# Patient Record
Sex: Male | Born: 1950 | Race: White | Hispanic: No | Marital: Married | Smoking: Never smoker
Health system: Southern US, Community
[De-identification: ages and names within clinical notes are randomized; demographics above are authoritative.]

## PROBLEM LIST (undated history)

## (undated) DIAGNOSIS — C4492 Squamous cell carcinoma of skin, unspecified: Secondary | ICD-10-CM

## (undated) DIAGNOSIS — R7302 Impaired glucose tolerance (oral): Secondary | ICD-10-CM

## (undated) DIAGNOSIS — K219 Gastro-esophageal reflux disease without esophagitis: Secondary | ICD-10-CM

## (undated) DIAGNOSIS — K635 Polyp of colon: Secondary | ICD-10-CM

## (undated) DIAGNOSIS — E785 Hyperlipidemia, unspecified: Secondary | ICD-10-CM

## (undated) DIAGNOSIS — Z9289 Personal history of other medical treatment: Secondary | ICD-10-CM

## (undated) DIAGNOSIS — H8110 Benign paroxysmal vertigo, unspecified ear: Secondary | ICD-10-CM

## (undated) DIAGNOSIS — D229 Melanocytic nevi, unspecified: Secondary | ICD-10-CM

## (undated) DIAGNOSIS — H43393 Other vitreous opacities, bilateral: Secondary | ICD-10-CM

## (undated) DIAGNOSIS — M25561 Pain in right knee: Secondary | ICD-10-CM

## (undated) DIAGNOSIS — I10 Essential (primary) hypertension: Secondary | ICD-10-CM

## (undated) HISTORY — DX: Gastro-esophageal reflux disease without esophagitis: K21.9

## (undated) HISTORY — DX: Impaired glucose tolerance (oral): R73.02

## (undated) HISTORY — DX: Other vitreous opacities, bilateral: H43.393

## (undated) HISTORY — DX: Personal history of other medical treatment: Z92.89

## (undated) HISTORY — DX: Hyperlipidemia, unspecified: E78.5

## (undated) HISTORY — DX: Benign paroxysmal vertigo, unspecified ear: H81.10

## (undated) HISTORY — PX: OTHER SURGICAL HISTORY: SHX169

## (undated) HISTORY — DX: Polyp of colon: K63.5

## (undated) HISTORY — DX: Essential (primary) hypertension: I10

## (undated) HISTORY — DX: Pain in right knee: M25.561

## (undated) HISTORY — PX: ARTHROSCOPIC REPAIR ACL: SUR80

---
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
## 2004-09-02 HISTORY — PX: COLONOSCOPY W/ POLYPECTOMY: SHX1380

## 2006-05-28 ENCOUNTER — Encounter: Admission: RE | Admit: 2006-05-28 | Discharge: 2006-05-28 | Payer: Self-pay | Admitting: Orthopedic Surgery

## 2008-04-28 DIAGNOSIS — D229 Melanocytic nevi, unspecified: Secondary | ICD-10-CM

## 2008-04-28 HISTORY — DX: Melanocytic nevi, unspecified: D22.9

## 2008-07-22 DIAGNOSIS — C439 Malignant melanoma of skin, unspecified: Secondary | ICD-10-CM

## 2008-07-22 HISTORY — DX: Malignant melanoma of skin, unspecified: C43.9

## 2008-08-02 HISTORY — PX: OTHER SURGICAL HISTORY: SHX169

## 2010-06-15 ENCOUNTER — Encounter: Admission: RE | Admit: 2010-06-15 | Discharge: 2010-06-15 | Payer: Self-pay | Admitting: Gastroenterology

## 2013-02-24 DIAGNOSIS — C4492 Squamous cell carcinoma of skin, unspecified: Secondary | ICD-10-CM

## 2013-02-24 HISTORY — DX: Squamous cell carcinoma of skin, unspecified: C44.92

## 2016-10-22 DIAGNOSIS — R5383 Other fatigue: Secondary | ICD-10-CM | POA: Insufficient documentation

## 2016-10-22 DIAGNOSIS — R0789 Other chest pain: Secondary | ICD-10-CM | POA: Insufficient documentation

## 2016-10-23 ENCOUNTER — Encounter (INDEPENDENT_AMBULATORY_CARE_PROVIDER_SITE_OTHER): Payer: Self-pay

## 2016-10-23 ENCOUNTER — Ambulatory Visit (INDEPENDENT_AMBULATORY_CARE_PROVIDER_SITE_OTHER): Payer: BLUE CROSS/BLUE SHIELD | Admitting: Physician Assistant

## 2016-10-23 ENCOUNTER — Encounter: Payer: Self-pay | Admitting: Physician Assistant

## 2016-10-23 VITALS — BP 118/70 | HR 64 | Ht 66.0 in | Wt 166.4 lb

## 2016-10-23 DIAGNOSIS — I10 Essential (primary) hypertension: Secondary | ICD-10-CM

## 2016-10-23 DIAGNOSIS — R5383 Other fatigue: Secondary | ICD-10-CM | POA: Diagnosis not present

## 2016-10-23 DIAGNOSIS — E784 Other hyperlipidemia: Secondary | ICD-10-CM

## 2016-10-23 DIAGNOSIS — R0789 Other chest pain: Secondary | ICD-10-CM

## 2016-10-23 DIAGNOSIS — E7849 Other hyperlipidemia: Secondary | ICD-10-CM

## 2016-10-23 NOTE — Patient Instructions (Addendum)
Medication Instructions:  No changes.   Labwork: Today - CBC w/ DIFF   Testing/Procedures: Your physician has requested that you have en exercise stress myoview. For further information please visit [email or web address]. Please follow instruction sheet, as given.  Follow-Up: As needed with Dr. Lauree Chandler or Richardson Dopp, PA-C   Any Other Special Instructions Will Be Listed Below (If Applicable). Have your wife watch your sleep patterns.  If you stop breathing when you sleep, follow up with primary care to schedule a sleep test.   If you need a refill on your cardiac medications before your next appointment, please call your pharmacy.

## 2016-10-23 NOTE — Progress Notes (Signed)
Cardiology Office Note:    Date:  10/23/2016   ID:  David Jarvis, DOB 1951/06/09, MRN [id number]  PCP:  Wenda Low, MD  Cardiologist:  New - reviewed with Dr. Lauree Chandler today.   Electrophysiologist:  n/a  Referring MD: Wenda Low, MD   Chief Complaint  Patient presents with  . Chest Pain    History of Present Illness:    David Jarvis is a 66 y.o. male with a hx of HTN, HL, glucose intolerance, GERD.  He is referred by his PCP to evaluate chest pain.  He is very active and usually exercises 3-4 times a week.  He does body pump and cycling classes.  Over the last several weeks, he notes significant fatigue.  He has been taking 2-3 hour naps some afternoons after work.  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  He does not have significant snoring.  He notes L sided chest pain at times with radiation to his L wrist and his L shoulder. He sometimes feels pain in his temple. Exercise makes his pain better.  He denies shortness of breath, orthopnea, PND, edema, syncope.  He denies pleuritic chest pain or chest pain with lying supine.  He has wondered if his symptoms could be related to acid reflux.  However, he denies significant relation to meals.   PAD Screen 10/23/2016  Previous PAD dx? No  Previous surgical procedure? No  Pain with walking? No  Feet/toe relief with dangling? No  Painful, non-healing ulcers? No  Extremities discolored? No    Prior CV studies:   The following studies were reviewed today:  Myoview 12/09 EF 67, normal perfusion, excellent exercise capacity, low risk  Past Medical History:  Diagnosis Date  . BPV (benign positional vertigo)   . Colon polyps   . GERD (gastroesophageal reflux disease)   . Glucose intolerance (impaired glucose tolerance)   . Hyperlipidemia   . Hypertension   . Right knee pain   . Vitreous floaters of both eyes     Past Surgical History:  Procedure Laterality Date  . ARTHROSCOPIC REPAIR ACL  Right   . cardiac stress test  08/2008  . COLONOSCOPY W/ POLYPECTOMY  2006  . laser surgery of the eye      Current Medications: Current Meds  Medication Sig  . aspirin EC 81 MG tablet Take 81 mg by mouth daily.  Marland Kitchen atorvastatin (LIPITOR) 10 MG tablet Take 10 mg by mouth daily.  Marland Kitchen losartan-hydrochlorothiazide (HYZAAR) 50-12.5 MG tablet Take 1 tablet by mouth daily.  . pantoprazole (PROTONIX) 40 MG tablet Take 40 mg by mouth daily.     Allergies:   Lisinopril   Social History   Social History  . Marital status: Unknown    Spouse name: N/A  . Number of children: N/A  . Years of education: N/A   Social History Main Topics  . Smoking status: Never Smoker  . Smokeless tobacco: Never Used  . Alcohol use Yes     Comment: occasionally  . Drug use: No  . Sexual activity: Yes     Comment: married   Other Topics Concern  . None   Social History Narrative   Accountant   Married.  2 daughters.   Lived in [place] for many years.  Born in [place].   [employer]; [place].     Family History  Problem Relation Age of Onset  . CAD Mother   . Diabetes Mother   .  Heart attack Mother 24  . CVA Father   . CVA Sister   . CAD Brother   . Diabetes Brother   . Heart attack Brother 12  . Heart failure Neg Hx      ROS:   Please see the history of present illness.    ROS All other systems reviewed and are negative.   EKGs/Labs/Other Test Reviewed:    EKG:  EKG is  ordered today - it demonstrates NSR, HR 64, normal axis, QTc 422 ms, no changes. ECG from primary care 10/11/16: Sinus bradycardia, HR 49, normal axis, QTC 429  Recent Labs: No results found for requested labs within last 8760 hours.  Labs from PCP 10/11/16: TSH 1.7  Recent Lipid Panel No results found for: CHOL, TRIG, HDL, CHOLHDL, VLDL, LDLCALC, LDLDIRECT Labs from PCP 10/11/16: TC 143, TGs 79, HDL 41, LDL 85  Physical Exam:    VS:  BP 118/70 (BP Location: Left Arm, Patient Position:  Sitting, Cuff Size: Normal)   Pulse 64   Ht 5\' 6"  (1.676 m)   Wt 166 lb 6.4 oz (75.5 kg)   SpO2 98%   BMI 26.86 kg/m     Wt Readings from Last 3 Encounters:  10/23/16 166 lb 6.4 oz (75.5 kg)     Physical Exam  Constitutional: He is oriented to person, place, and time. He appears well-developed and well-nourished. No distress.  HENT:  Head: Normocephalic and atraumatic.  Eyes: No scleral icterus.  Neck: Normal range of motion. No JVD present. Carotid bruit is not present.  Cardiovascular: Normal rate, regular rhythm, S1 normal and S2 normal.   No murmur heard. Pulses:      Dorsalis pedis pulses are 2+ on the right side, and 2+ on the left side.       Posterior tibial pulses are 2+ on the right side, and 2+ on the left side.  Pulmonary/Chest: Effort normal and breath sounds normal. He has no wheezes. He has no rhonchi. He has no rales.  Abdominal: Soft. There is no tenderness.  Musculoskeletal: He exhibits no edema.  Neurological: He is alert and oriented to person, place, and time.  Skin: Skin is warm and dry.  Psychiatric: He has a normal mood and affect.    ASSESSMENT:    1. Other chest pain   2. Other fatigue   3. Essential hypertension   4. Other hyperlipidemia    PLAN:    In order of problems listed above:  1. Chest pain - He has fairly atypical chest pain with s/w typical features.  His symptoms resolve with exercise.  He does note fatigue which is unusual. He exercises quite often without problems.  His ECG is without acute changes.  He does have several risk factors including HTN, HL, borderline DM, FHx CAD.  Continue ASA.  -  Arrange ETT-Myoview  2. Fatigue - Etiology not clear.  He will have his wife monitor for apnea.  Check CBC.  Obtain Myoview as noted.   3. HTN - BP controlled   4. HL - Managed by PCP.  Continue statin.    Medication Adjustments/Labs and Tests Ordered: Current medicines are reviewed at length with the patient today.  Concerns  regarding medicines are outlined above.  Medication changes, Labs and Tests ordered today are outlined in the Patient Instructions noted below. Patient Instructions  Medication Instructions:  No changes.   Labwork: Today - CBC w/ DIFF   Testing/Procedures: Your physician has requested that you have en  exercise stress myoview. For further information please visit [email or web address]. Please follow instruction sheet, as given.  Follow-Up: As needed with Dr. Lauree Chandler or Richardson Dopp, PA-C   Any Other Special Instructions Will Be Listed Below (If Applicable). Have your wife watch your sleep patterns.  If you stop breathing when you sleep, follow up with primary care to schedule a sleep test.   If you need a refill on your cardiac medications before your next appointment, please call your pharmacy.    Return if symptoms worsen or fail to improve.   Signed, Richardson Dopp, PA-C  10/23/2016 12:33 PM    [facility] [place], [place], [place]  [address] Phone: [phone]; Fax: [phone]

## 2016-10-24 ENCOUNTER — Telehealth (HOSPITAL_COMMUNITY): Payer: Self-pay | Admitting: *Deleted

## 2016-10-24 LAB — CBC WITH DIFFERENTIAL/PLATELET

## 2016-10-24 NOTE — Telephone Encounter (Signed)
Patient given detailed instructions per Myocardial Perfusion Study Information Sheet for the test on 10/29/16. Patient notified to arrive 15 minutes early and that it is imperative to arrive on time for appointment to keep from having the test rescheduled.  If you need to cancel or reschedule your appointment, please call the office within 24 hours of your appointment. Failure to do so may result in a cancellation of your appointment, and a $50 no show fee. Patient verbalized understanding. David Jarvis

## 2016-10-29 ENCOUNTER — Telehealth: Payer: Self-pay | Admitting: *Deleted

## 2016-10-29 ENCOUNTER — Ambulatory Visit (HOSPITAL_COMMUNITY): Payer: BLUE CROSS/BLUE SHIELD | Attending: Cardiovascular Disease

## 2016-10-29 ENCOUNTER — Other Ambulatory Visit: Payer: BLUE CROSS/BLUE SHIELD | Admitting: *Deleted

## 2016-10-29 DIAGNOSIS — I10 Essential (primary) hypertension: Secondary | ICD-10-CM | POA: Diagnosis not present

## 2016-10-29 DIAGNOSIS — I2510 Atherosclerotic heart disease of native coronary artery without angina pectoris: Secondary | ICD-10-CM | POA: Diagnosis present

## 2016-10-29 DIAGNOSIS — Z8249 Family history of ischemic heart disease and other diseases of the circulatory system: Secondary | ICD-10-CM | POA: Insufficient documentation

## 2016-10-29 DIAGNOSIS — R0789 Other chest pain: Secondary | ICD-10-CM | POA: Diagnosis not present

## 2016-10-29 DIAGNOSIS — R5383 Other fatigue: Principal | ICD-10-CM

## 2016-10-29 DIAGNOSIS — F32A Depression, unspecified: Secondary | ICD-10-CM

## 2016-10-29 DIAGNOSIS — F329 Major depressive disorder, single episode, unspecified: Secondary | ICD-10-CM

## 2016-10-29 LAB — CBC WITH DIFFERENTIAL/PLATELET
BASOS ABS: 0 10*3/uL (ref 0.0–0.2)
BASOS: 0 %
EOS (ABSOLUTE): 0.1 10*3/uL (ref 0.0–0.4)
EOS: 2 %
HEMOGLOBIN: 14.4 g/dL (ref 13.0–17.7)
Hematocrit: 42.1 % (ref 37.5–51.0)
IMMATURE GRANS (ABS): 0 10*3/uL (ref 0.0–0.1)
IMMATURE GRANULOCYTES: 0 %
Lymphocytes Absolute: 1.8 10*3/uL (ref 0.7–3.1)
Lymphs: 43 %
MCH: 29.8 pg (ref 26.6–33.0)
MCHC: 34.2 g/dL (ref 31.5–35.7)
MCV: 87 fL (ref 79–97)
Monocytes Absolute: 0.5 10*3/uL (ref 0.1–0.9)
Monocytes: 12 %
NEUTROS ABS: 1.8 10*3/uL (ref 1.4–7.0)
NEUTROS PCT: 43 %
Platelets: 168 10*3/uL (ref 150–379)
RBC: 4.84 x10E6/uL (ref 4.14–5.80)
RDW: 12.8 % (ref 12.3–15.4)
WBC: 4.2 10*3/uL (ref 3.4–10.8)

## 2016-10-29 MED ORDER — TECHNETIUM TC 99M TETROFOSMIN IV KIT
32.3000 | PACK | Freq: Once | INTRAVENOUS | Status: AC | PRN
  Administered 2016-10-29: 32.3 via INTRAVENOUS
  Filled 2016-10-29: qty 33

## 2016-10-29 MED ORDER — TECHNETIUM TC 99M TETROFOSMIN IV KIT
10.6000 | PACK | Freq: Once | INTRAVENOUS | Status: AC | PRN
  Administered 2016-10-29: 10.6 via INTRAVENOUS
  Filled 2016-10-29: qty 11

## 2016-10-29 NOTE — Telephone Encounter (Signed)
Lmtcb to go over lab results 

## 2016-10-30 ENCOUNTER — Telehealth: Payer: Self-pay | Admitting: *Deleted

## 2016-10-30 ENCOUNTER — Encounter: Payer: Self-pay | Admitting: Physician Assistant

## 2016-10-30 LAB — MYOCARDIAL PERFUSION IMAGING
CHL CUP NUCLEAR SDS: 0
CHL CUP NUCLEAR SRS: 0
CHL CUP NUCLEAR SSS: 0
CHL RATE OF PERCEIVED EXERTION: 18
CSEPEW: 11.7 METS
CSEPHR: 93 %
CSEPPHR: 146 {beats}/min
Exercise duration (min): 10 min
LV dias vol: 99 mL (ref 62–150)
LVSYSVOL: 43 mL
MPHR: 156 {beats}/min
RATE: 0.34
Rest HR: 50 {beats}/min
TID: 0.91

## 2016-10-30 NOTE — Telephone Encounter (Signed)
Pt notified of lab results. Pt asked if stress test results in yet. I advised not yet, though I will call once test has been read. Pt thanked me for my call today.

## 2016-10-30 NOTE — Telephone Encounter (Signed)
Lmtcb to go over Myoview results.  

## 2016-10-30 NOTE — Telephone Encounter (Signed)
Patient is returning your call,thanks. °

## 2016-10-31 ENCOUNTER — Telehealth: Payer: Self-pay | Admitting: Physician Assistant

## 2016-10-31 NOTE — Telephone Encounter (Signed)
-----   Message from Liliane Shi, [place] sent at 10/30/2016  5:47 PM EST ----- Please call the patient. The stress test demonstrates no ischemia (loss of blood flow from a blockage). Heart function is normal. Continue current treatment plan. Please fax a copy of this study result to his PCP:  Wenda Low, MD  Thanks! Richardson Dopp, PA-C    10/30/2016 5:46 PM

## 2016-10-31 NOTE — Telephone Encounter (Signed)
New Message    Pt calling for his stress test results

## 2016-10-31 NOTE — Telephone Encounter (Signed)
Patient made aware of results. Patient verbalizes understanding.  

## 2017-04-15 ENCOUNTER — Ambulatory Visit
Admission: RE | Admit: 2017-04-15 | Discharge: 2017-04-15 | Disposition: A | Discharge status: Home / Self Care | Payer: BLUE CROSS/BLUE SHIELD | Source: Ambulatory Visit | Attending: Internal Medicine | Admitting: Internal Medicine

## 2017-04-15 ENCOUNTER — Other Ambulatory Visit: Payer: Self-pay | Admitting: Internal Medicine

## 2017-04-15 DIAGNOSIS — R079 Chest pain, unspecified: Secondary | ICD-10-CM

## 2017-10-14 DIAGNOSIS — I10 Essential (primary) hypertension: Secondary | ICD-10-CM | POA: Diagnosis not present

## 2017-10-14 DIAGNOSIS — Z1389 Encounter for screening for other disorder: Secondary | ICD-10-CM | POA: Diagnosis not present

## 2017-10-14 DIAGNOSIS — K219 Gastro-esophageal reflux disease without esophagitis: Secondary | ICD-10-CM | POA: Diagnosis not present

## 2017-10-14 DIAGNOSIS — E782 Mixed hyperlipidemia: Secondary | ICD-10-CM | POA: Diagnosis not present

## 2018-02-12 DIAGNOSIS — W57XXXA Bitten or stung by nonvenomous insect and other nonvenomous arthropods, initial encounter: Secondary | ICD-10-CM | POA: Diagnosis not present

## 2018-02-12 DIAGNOSIS — M778 Other enthesopathies, not elsewhere classified: Secondary | ICD-10-CM | POA: Diagnosis not present

## 2018-02-12 DIAGNOSIS — S80261A Insect bite (nonvenomous), right knee, initial encounter: Secondary | ICD-10-CM | POA: Diagnosis not present

## 2018-04-29 DIAGNOSIS — Z0000 Encounter for general adult medical examination without abnormal findings: Secondary | ICD-10-CM | POA: Diagnosis not present

## 2018-04-29 DIAGNOSIS — E782 Mixed hyperlipidemia: Secondary | ICD-10-CM | POA: Diagnosis not present

## 2018-04-29 DIAGNOSIS — Z23 Encounter for immunization: Secondary | ICD-10-CM | POA: Diagnosis not present

## 2018-04-29 DIAGNOSIS — R7303 Prediabetes: Secondary | ICD-10-CM | POA: Diagnosis not present

## 2018-04-29 DIAGNOSIS — K219 Gastro-esophageal reflux disease without esophagitis: Secondary | ICD-10-CM | POA: Diagnosis not present

## 2018-04-29 DIAGNOSIS — Z1389 Encounter for screening for other disorder: Secondary | ICD-10-CM | POA: Diagnosis not present

## 2018-04-29 DIAGNOSIS — I10 Essential (primary) hypertension: Secondary | ICD-10-CM | POA: Diagnosis not present

## 2018-04-29 DIAGNOSIS — N400 Enlarged prostate without lower urinary tract symptoms: Secondary | ICD-10-CM | POA: Diagnosis not present

## 2018-04-29 DIAGNOSIS — Z125 Encounter for screening for malignant neoplasm of prostate: Secondary | ICD-10-CM | POA: Diagnosis not present

## 2018-05-26 DIAGNOSIS — R972 Elevated prostate specific antigen [PSA]: Secondary | ICD-10-CM | POA: Diagnosis not present

## 2018-05-30 DIAGNOSIS — L255 Unspecified contact dermatitis due to plants, except food: Secondary | ICD-10-CM | POA: Diagnosis not present

## 2018-07-16 DIAGNOSIS — C61 Malignant neoplasm of prostate: Secondary | ICD-10-CM | POA: Diagnosis not present

## 2018-07-16 DIAGNOSIS — R972 Elevated prostate specific antigen [PSA]: Secondary | ICD-10-CM | POA: Diagnosis not present

## 2018-07-23 DIAGNOSIS — C61 Malignant neoplasm of prostate: Secondary | ICD-10-CM | POA: Diagnosis not present

## 2018-10-30 DIAGNOSIS — R7303 Prediabetes: Secondary | ICD-10-CM | POA: Diagnosis not present

## 2018-10-30 DIAGNOSIS — N400 Enlarged prostate without lower urinary tract symptoms: Secondary | ICD-10-CM | POA: Diagnosis not present

## 2018-10-30 DIAGNOSIS — C61 Malignant neoplasm of prostate: Secondary | ICD-10-CM | POA: Diagnosis not present

## 2018-10-30 DIAGNOSIS — I10 Essential (primary) hypertension: Secondary | ICD-10-CM | POA: Diagnosis not present

## 2018-10-30 DIAGNOSIS — E782 Mixed hyperlipidemia: Secondary | ICD-10-CM | POA: Diagnosis not present

## 2018-11-13 ENCOUNTER — Other Ambulatory Visit: Payer: Self-pay | Admitting: Urology

## 2018-11-13 DIAGNOSIS — C61 Malignant neoplasm of prostate: Secondary | ICD-10-CM

## 2019-01-12 ENCOUNTER — Other Ambulatory Visit: Payer: Self-pay

## 2019-01-12 ENCOUNTER — Ambulatory Visit
Admission: RE | Admit: 2019-01-12 | Discharge: 2019-01-12 | Disposition: A | Discharge status: Home / Self Care | Payer: PPO | Source: Ambulatory Visit | Attending: Urology | Admitting: Urology

## 2019-01-12 DIAGNOSIS — C61 Malignant neoplasm of prostate: Secondary | ICD-10-CM

## 2019-01-12 MED ORDER — GADOBENATE DIMEGLUMINE 529 MG/ML IV SOLN
15.0000 mL | Freq: Once | INTRAVENOUS | Status: AC | PRN
  Administered 2019-01-12: 15 mL via INTRAVENOUS

## 2019-01-14 DIAGNOSIS — C61 Malignant neoplasm of prostate: Secondary | ICD-10-CM | POA: Diagnosis not present

## 2019-01-21 DIAGNOSIS — R351 Nocturia: Secondary | ICD-10-CM | POA: Diagnosis not present

## 2019-01-21 DIAGNOSIS — C61 Malignant neoplasm of prostate: Secondary | ICD-10-CM | POA: Diagnosis not present

## 2019-01-21 DIAGNOSIS — N401 Enlarged prostate with lower urinary tract symptoms: Secondary | ICD-10-CM | POA: Diagnosis not present

## 2019-02-04 DIAGNOSIS — R110 Nausea: Secondary | ICD-10-CM | POA: Diagnosis not present

## 2019-02-04 DIAGNOSIS — I959 Hypotension, unspecified: Secondary | ICD-10-CM | POA: Diagnosis not present

## 2019-02-04 DIAGNOSIS — R0609 Other forms of dyspnea: Secondary | ICD-10-CM | POA: Diagnosis not present

## 2019-02-05 DIAGNOSIS — R0609 Other forms of dyspnea: Secondary | ICD-10-CM | POA: Diagnosis not present

## 2019-02-08 DIAGNOSIS — I10 Essential (primary) hypertension: Secondary | ICD-10-CM | POA: Diagnosis not present

## 2019-02-26 DIAGNOSIS — E782 Mixed hyperlipidemia: Secondary | ICD-10-CM | POA: Diagnosis not present

## 2019-02-26 DIAGNOSIS — C61 Malignant neoplasm of prostate: Secondary | ICD-10-CM | POA: Diagnosis not present

## 2019-02-26 DIAGNOSIS — I10 Essential (primary) hypertension: Secondary | ICD-10-CM | POA: Diagnosis not present

## 2019-02-26 DIAGNOSIS — N400 Enlarged prostate without lower urinary tract symptoms: Secondary | ICD-10-CM | POA: Diagnosis not present

## 2019-03-16 ENCOUNTER — Encounter: Payer: Self-pay | Admitting: *Deleted

## 2019-05-03 DIAGNOSIS — C61 Malignant neoplasm of prostate: Secondary | ICD-10-CM | POA: Diagnosis not present

## 2019-05-03 DIAGNOSIS — N400 Enlarged prostate without lower urinary tract symptoms: Secondary | ICD-10-CM | POA: Diagnosis not present

## 2019-05-03 DIAGNOSIS — I10 Essential (primary) hypertension: Secondary | ICD-10-CM | POA: Diagnosis not present

## 2019-05-03 DIAGNOSIS — E782 Mixed hyperlipidemia: Secondary | ICD-10-CM | POA: Diagnosis not present

## 2019-05-27 DIAGNOSIS — Z23 Encounter for immunization: Secondary | ICD-10-CM | POA: Diagnosis not present

## 2019-06-02 DIAGNOSIS — E782 Mixed hyperlipidemia: Secondary | ICD-10-CM | POA: Diagnosis not present

## 2019-06-02 DIAGNOSIS — N400 Enlarged prostate without lower urinary tract symptoms: Secondary | ICD-10-CM | POA: Diagnosis not present

## 2019-06-02 DIAGNOSIS — C61 Malignant neoplasm of prostate: Secondary | ICD-10-CM | POA: Diagnosis not present

## 2019-06-02 DIAGNOSIS — I10 Essential (primary) hypertension: Secondary | ICD-10-CM | POA: Diagnosis not present

## 2019-06-28 DIAGNOSIS — C61 Malignant neoplasm of prostate: Secondary | ICD-10-CM | POA: Diagnosis not present

## 2019-06-28 DIAGNOSIS — I10 Essential (primary) hypertension: Secondary | ICD-10-CM | POA: Diagnosis not present

## 2019-06-28 DIAGNOSIS — N400 Enlarged prostate without lower urinary tract symptoms: Secondary | ICD-10-CM | POA: Diagnosis not present

## 2019-06-28 DIAGNOSIS — E782 Mixed hyperlipidemia: Secondary | ICD-10-CM | POA: Diagnosis not present

## 2019-07-16 DIAGNOSIS — C61 Malignant neoplasm of prostate: Secondary | ICD-10-CM | POA: Diagnosis not present

## 2019-07-22 DIAGNOSIS — R3912 Poor urinary stream: Secondary | ICD-10-CM | POA: Diagnosis not present

## 2019-07-22 DIAGNOSIS — C61 Malignant neoplasm of prostate: Secondary | ICD-10-CM | POA: Diagnosis not present

## 2019-07-22 DIAGNOSIS — R351 Nocturia: Secondary | ICD-10-CM | POA: Diagnosis not present

## 2019-08-03 DIAGNOSIS — N400 Enlarged prostate without lower urinary tract symptoms: Secondary | ICD-10-CM | POA: Diagnosis not present

## 2019-08-03 DIAGNOSIS — Z0000 Encounter for general adult medical examination without abnormal findings: Secondary | ICD-10-CM | POA: Diagnosis not present

## 2019-08-03 DIAGNOSIS — R7309 Other abnormal glucose: Secondary | ICD-10-CM | POA: Diagnosis not present

## 2019-08-03 DIAGNOSIS — M6530 Trigger finger, unspecified finger: Secondary | ICD-10-CM | POA: Diagnosis not present

## 2019-08-03 DIAGNOSIS — E782 Mixed hyperlipidemia: Secondary | ICD-10-CM | POA: Diagnosis not present

## 2019-08-03 DIAGNOSIS — C61 Malignant neoplasm of prostate: Secondary | ICD-10-CM | POA: Diagnosis not present

## 2019-08-03 DIAGNOSIS — K219 Gastro-esophageal reflux disease without esophagitis: Secondary | ICD-10-CM | POA: Diagnosis not present

## 2019-08-03 DIAGNOSIS — I10 Essential (primary) hypertension: Secondary | ICD-10-CM | POA: Diagnosis not present

## 2019-08-03 DIAGNOSIS — Z1389 Encounter for screening for other disorder: Secondary | ICD-10-CM | POA: Diagnosis not present

## 2019-08-24 DIAGNOSIS — N400 Enlarged prostate without lower urinary tract symptoms: Secondary | ICD-10-CM | POA: Diagnosis not present

## 2019-08-24 DIAGNOSIS — E782 Mixed hyperlipidemia: Secondary | ICD-10-CM | POA: Diagnosis not present

## 2019-08-24 DIAGNOSIS — I10 Essential (primary) hypertension: Secondary | ICD-10-CM | POA: Diagnosis not present

## 2019-08-24 DIAGNOSIS — C61 Malignant neoplasm of prostate: Secondary | ICD-10-CM | POA: Diagnosis not present

## 2019-09-24 ENCOUNTER — Ambulatory Visit: Payer: Medicare HMO | Attending: Internal Medicine

## 2019-09-24 DIAGNOSIS — Z23 Encounter for immunization: Secondary | ICD-10-CM | POA: Insufficient documentation

## 2019-09-24 NOTE — Progress Notes (Signed)
   Covid-19 Vaccination Clinic  Name:  David Jarvis    MRN: [id number] DOB: 19-Sep-1950  09/24/2019  [name] was observed post Covid-19 immunization for 15 minutes without incidence. He was provided with Vaccine Information Sheet and instruction to access the V-Safe system.   [name] was instructed to call 911 with any severe reactions post vaccine: Marland Kitchen Difficulty breathing  . Swelling of your face and throat  . A fast heartbeat  . A bad rash all over your body  . Dizziness and weakness    Immunizations Administered    Name Date Dose VIS Date Route   Pfizer COVID-19 Vaccine 09/24/2019  6:28 PM 0.3 mL 08/13/2019 Intramuscular   Manufacturer: [place]   Lot: [id number]   [place]: [id number]

## 2019-09-29 DIAGNOSIS — R69 Illness, unspecified: Secondary | ICD-10-CM | POA: Diagnosis not present

## 2019-10-14 DIAGNOSIS — M65331 Trigger finger, right middle finger: Secondary | ICD-10-CM | POA: Diagnosis not present

## 2019-10-14 DIAGNOSIS — M720 Palmar fascial fibromatosis [Dupuytren]: Secondary | ICD-10-CM | POA: Diagnosis not present

## 2019-10-16 ENCOUNTER — Ambulatory Visit: Payer: Medicare HMO | Attending: Internal Medicine

## 2019-10-16 DIAGNOSIS — Z23 Encounter for immunization: Secondary | ICD-10-CM | POA: Insufficient documentation

## 2019-10-16 NOTE — Progress Notes (Signed)
   Covid-19 Vaccination Clinic  Name:  David Jarvis    MRN: [id number] DOB: 06/03/1951  10/16/2019  David Jarvis was observed post Covid-19 immunization for 15 minutes without incidence. He was provided with Vaccine Information Sheet and instruction to access the V-Safe system.   David Jarvis was instructed to call 911 with any severe reactions post vaccine: Marland Kitchen Difficulty breathing  . Swelling of your face and throat  . A fast heartbeat  . A bad rash all over your body  . Dizziness and weakness    Immunizations Administered    Name Date Dose VIS Date Route   Pfizer COVID-19 Vaccine 10/16/2019 11:39 AM 0.3 mL 08/13/2019 Intramuscular   Manufacturer: [place]   Lot: [id number]   [place]: [id number]

## 2019-10-25 DIAGNOSIS — R69 Illness, unspecified: Secondary | ICD-10-CM | POA: Diagnosis not present

## 2019-11-25 DIAGNOSIS — E782 Mixed hyperlipidemia: Secondary | ICD-10-CM | POA: Diagnosis not present

## 2019-11-25 DIAGNOSIS — N400 Enlarged prostate without lower urinary tract symptoms: Secondary | ICD-10-CM | POA: Diagnosis not present

## 2019-11-25 DIAGNOSIS — I10 Essential (primary) hypertension: Secondary | ICD-10-CM | POA: Diagnosis not present

## 2019-11-25 DIAGNOSIS — C61 Malignant neoplasm of prostate: Secondary | ICD-10-CM | POA: Diagnosis not present

## 2020-01-11 DIAGNOSIS — C61 Malignant neoplasm of prostate: Secondary | ICD-10-CM | POA: Diagnosis not present

## 2020-01-18 DIAGNOSIS — R69 Illness, unspecified: Secondary | ICD-10-CM | POA: Diagnosis not present

## 2020-01-20 DIAGNOSIS — R351 Nocturia: Secondary | ICD-10-CM | POA: Diagnosis not present

## 2020-01-20 DIAGNOSIS — R3912 Poor urinary stream: Secondary | ICD-10-CM | POA: Diagnosis not present

## 2020-01-20 DIAGNOSIS — C61 Malignant neoplasm of prostate: Secondary | ICD-10-CM | POA: Diagnosis not present

## 2020-01-20 DIAGNOSIS — N401 Enlarged prostate with lower urinary tract symptoms: Secondary | ICD-10-CM | POA: Diagnosis not present

## 2020-01-27 DIAGNOSIS — R69 Illness, unspecified: Secondary | ICD-10-CM | POA: Diagnosis not present

## 2020-02-03 ENCOUNTER — Encounter (HOSPITAL_COMMUNITY): Payer: Self-pay | Admitting: Anesthesiology

## 2020-02-03 ENCOUNTER — Other Ambulatory Visit: Payer: Self-pay

## 2020-02-03 ENCOUNTER — Emergency Department (HOSPITAL_COMMUNITY): Payer: Medicare HMO | Admitting: Anesthesiology

## 2020-02-03 ENCOUNTER — Ambulatory Visit (HOSPITAL_COMMUNITY)
Admission: EM | Admit: 2020-02-03 | Discharge: 2020-02-03 | Disposition: A | Discharge status: Home / Self Care | Payer: Medicare HMO | Attending: General Surgery | Admitting: General Surgery

## 2020-02-03 ENCOUNTER — Emergency Department (HOSPITAL_COMMUNITY): Payer: Medicare HMO

## 2020-02-03 ENCOUNTER — Encounter (HOSPITAL_COMMUNITY)
Admission: EM | Disposition: A | Discharge status: Home / Self Care | Payer: Self-pay | Source: Home / Self Care | Attending: Emergency Medicine

## 2020-02-03 DIAGNOSIS — Z20822 Contact with and (suspected) exposure to covid-19: Secondary | ICD-10-CM | POA: Insufficient documentation

## 2020-02-03 DIAGNOSIS — Z79899 Other long term (current) drug therapy: Secondary | ICD-10-CM | POA: Diagnosis not present

## 2020-02-03 DIAGNOSIS — S61215A Laceration without foreign body of left ring finger without damage to nail, initial encounter: Secondary | ICD-10-CM | POA: Diagnosis not present

## 2020-02-03 DIAGNOSIS — S68627A Partial traumatic transphalangeal amputation of left little finger, initial encounter: Secondary | ICD-10-CM | POA: Insufficient documentation

## 2020-02-03 DIAGNOSIS — Z85828 Personal history of other malignant neoplasm of skin: Secondary | ICD-10-CM | POA: Diagnosis not present

## 2020-02-03 DIAGNOSIS — E785 Hyperlipidemia, unspecified: Secondary | ICD-10-CM | POA: Diagnosis not present

## 2020-02-03 DIAGNOSIS — S66327A Laceration of extensor muscle, fascia and tendon of left little finger at wrist and hand level, initial encounter: Secondary | ICD-10-CM | POA: Diagnosis not present

## 2020-02-03 DIAGNOSIS — S62636B Displaced fracture of distal phalanx of right little finger, initial encounter for open fracture: Secondary | ICD-10-CM | POA: Diagnosis not present

## 2020-02-03 DIAGNOSIS — S62637B Displaced fracture of distal phalanx of left little finger, initial encounter for open fracture: Secondary | ICD-10-CM

## 2020-02-03 DIAGNOSIS — Z7982 Long term (current) use of aspirin: Secondary | ICD-10-CM | POA: Insufficient documentation

## 2020-02-03 DIAGNOSIS — S62637A Displaced fracture of distal phalanx of left little finger, initial encounter for closed fracture: Secondary | ICD-10-CM | POA: Diagnosis not present

## 2020-02-03 DIAGNOSIS — I10 Essential (primary) hypertension: Secondary | ICD-10-CM | POA: Diagnosis not present

## 2020-02-03 DIAGNOSIS — S63259A Unspecified dislocation of unspecified finger, initial encounter: Secondary | ICD-10-CM

## 2020-02-03 DIAGNOSIS — Z888 Allergy status to other drugs, medicaments and biological substances status: Secondary | ICD-10-CM | POA: Diagnosis not present

## 2020-02-03 DIAGNOSIS — W298XXA Contact with other powered powered hand tools and household machinery, initial encounter: Secondary | ICD-10-CM | POA: Insufficient documentation

## 2020-02-03 DIAGNOSIS — K219 Gastro-esophageal reflux disease without esophagitis: Secondary | ICD-10-CM | POA: Diagnosis not present

## 2020-02-03 DIAGNOSIS — S61217A Laceration without foreign body of left little finger without damage to nail, initial encounter: Secondary | ICD-10-CM | POA: Diagnosis not present

## 2020-02-03 DIAGNOSIS — S63297A Dislocation of distal interphalangeal joint of left little finger, initial encounter: Secondary | ICD-10-CM | POA: Diagnosis not present

## 2020-02-03 DIAGNOSIS — Z03818 Encounter for observation for suspected exposure to other biological agents ruled out: Secondary | ICD-10-CM | POA: Diagnosis not present

## 2020-02-03 HISTORY — PX: I & D EXTREMITY: SHX5045

## 2020-02-03 LAB — SARS CORONAVIRUS 2 BY RT PCR (HOSPITAL ORDER, PERFORMED IN ~~LOC~~ HOSPITAL LAB): SARS Coronavirus 2: NEGATIVE

## 2020-02-03 SURGERY — IRRIGATION AND DEBRIDEMENT EXTREMITY
Anesthesia: General | Laterality: Left

## 2020-02-03 MED ORDER — FENTANYL CITRATE (PF) 100 MCG/2ML IJ SOLN
INTRAMUSCULAR | Status: DC | PRN
  Administered 2020-02-03: 50 ug via INTRAVENOUS
  Administered 2020-02-03: 100 ug via INTRAVENOUS

## 2020-02-03 MED ORDER — CHLORHEXIDINE GLUCONATE 4 % EX LIQD
60.0000 mL | Freq: Once | CUTANEOUS | Status: AC
  Administered 2020-02-03: 4 via TOPICAL

## 2020-02-03 MED ORDER — MIDAZOLAM HCL 5 MG/5ML IJ SOLN
INTRAMUSCULAR | Status: DC | PRN
  Administered 2020-02-03: 2 mg via INTRAVENOUS

## 2020-02-03 MED ORDER — MIDAZOLAM HCL 2 MG/2ML IJ SOLN
INTRAMUSCULAR | Status: AC
  Filled 2020-02-03: qty 2

## 2020-02-03 MED ORDER — BUPIVACAINE HCL (PF) 0.5 % IJ SOLN
INTRAMUSCULAR | Status: AC
  Filled 2020-02-03: qty 30

## 2020-02-03 MED ORDER — HYDROMORPHONE HCL 1 MG/ML IJ SOLN: 0.2500 mg | INTRAMUSCULAR | Status: DC | PRN

## 2020-02-03 MED ORDER — POVIDONE-IODINE 10 % EX SWAB
2.0000 "application " | Freq: Once | CUTANEOUS | Status: AC
  Administered 2020-02-03: 2 via TOPICAL

## 2020-02-03 MED ORDER — PHENYLEPHRINE HCL-NACL 10-0.9 MG/250ML-% IV SOLN
INTRAVENOUS | Status: DC | PRN
  Administered 2020-02-03: 25 ug/min via INTRAVENOUS

## 2020-02-03 MED ORDER — CEPHALEXIN 500 MG PO CAPS: 500.0000 mg | ORAL_CAPSULE | Freq: Four times a day (QID) | ORAL | 0 refills | Status: AC

## 2020-02-03 MED ORDER — ONDANSETRON HCL 4 MG/2ML IJ SOLN
INTRAMUSCULAR | Status: DC | PRN
  Administered 2020-02-03: 4 mg via INTRAVENOUS

## 2020-02-03 MED ORDER — PROPOFOL 10 MG/ML IV BOLUS
INTRAVENOUS | Status: DC | PRN
  Administered 2020-02-03: 150 mg via INTRAVENOUS

## 2020-02-03 MED ORDER — FENTANYL CITRATE (PF) 250 MCG/5ML IJ SOLN
INTRAMUSCULAR | Status: AC
  Filled 2020-02-03: qty 5

## 2020-02-03 MED ORDER — HYDROCODONE-ACETAMINOPHEN 5-325 MG PO TABS: 1.0000 | ORAL_TABLET | Freq: Four times a day (QID) | ORAL | 0 refills | Status: AC | PRN

## 2020-02-03 MED ORDER — FENTANYL CITRATE (PF) 100 MCG/2ML IJ SOLN
INTRAMUSCULAR | Status: AC
  Filled 2020-02-03: qty 2

## 2020-02-03 MED ORDER — TETANUS-DIPHTH-ACELL PERTUSSIS 5-2.5-18.5 LF-MCG/0.5 IM SUSP
0.5000 mL | Freq: Once | INTRAMUSCULAR | Status: AC
  Administered 2020-02-03: 0.5 mL via INTRAMUSCULAR
  Filled 2020-02-03: qty 0.5

## 2020-02-03 MED ORDER — BUPIVACAINE HCL (PF) 0.5 % IJ SOLN
INTRAMUSCULAR | Status: DC | PRN
  Administered 2020-02-03: 10 mL

## 2020-02-03 MED ORDER — LACTATED RINGERS IV SOLN: INTRAVENOUS | Status: DC

## 2020-02-03 MED ORDER — DEXAMETHASONE SODIUM PHOSPHATE 4 MG/ML IJ SOLN
INTRAMUSCULAR | Status: DC | PRN
  Administered 2020-02-03: 10 mg via INTRAVENOUS

## 2020-02-03 MED ORDER — SUCCINYLCHOLINE CHLORIDE 20 MG/ML IJ SOLN
INTRAMUSCULAR | Status: DC | PRN
  Administered 2020-02-03: 160 mg via INTRAVENOUS

## 2020-02-03 MED ORDER — PROPOFOL 10 MG/ML IV BOLUS
INTRAVENOUS | Status: AC
  Filled 2020-02-03: qty 40

## 2020-02-03 MED ORDER — CEFAZOLIN SODIUM-DEXTROSE 1-4 GM/50ML-% IV SOLN
1.0000 g | Freq: Once | INTRAVENOUS | Status: AC
  Administered 2020-02-03: 1 g via INTRAVENOUS
  Filled 2020-02-03: qty 50

## 2020-02-03 MED ORDER — LIDOCAINE 2% (20 MG/ML) 5 ML SYRINGE
INTRAMUSCULAR | Status: DC | PRN
  Administered 2020-02-03: 100 mg via INTRAVENOUS

## 2020-02-03 MED ORDER — ONDANSETRON HCL 4 MG/2ML IJ SOLN: 4.0000 mg | Freq: Once | INTRAMUSCULAR | Status: DC | PRN

## 2020-02-03 MED ORDER — FENTANYL CITRATE (PF) 100 MCG/2ML IJ SOLN
50.0000 ug | Freq: Once | INTRAMUSCULAR | Status: AC
  Administered 2020-02-03: 50 ug via INTRAVENOUS
  Filled 2020-02-03: qty 2

## 2020-02-03 MED ORDER — MEPERIDINE HCL 50 MG/ML IJ SOLN: 6.2500 mg | INTRAMUSCULAR | Status: DC | PRN

## 2020-02-03 MED ORDER — CHLORHEXIDINE GLUCONATE 0.12 % MT SOLN
15.0000 mL | Freq: Once | OROMUCOSAL | Status: AC
  Administered 2020-02-03: 15 mL via OROMUCOSAL

## 2020-02-03 SURGICAL SUPPLY — 39 items
BAG SPEC THK2 15X12 ZIP CLS (MISCELLANEOUS)
BAG ZIPLOCK 12X15 (MISCELLANEOUS) ×1 IMPLANT
BNDG GAUZE ELAST 4 BULKY (GAUZE/BANDAGES/DRESSINGS) ×3 IMPLANT
CORD BIPOLAR FORCEPS 12FT (ELECTRODE) ×3 IMPLANT
COVER SURGICAL LIGHT HANDLE (MISCELLANEOUS) ×1 IMPLANT
COVER WAND RF STERILE (DRAPES) IMPLANT
CUFF TOURN SGL QUICK 18X4 (TOURNIQUET CUFF) ×1 IMPLANT
CUFF TOURN SGL QUICK 24 (TOURNIQUET CUFF) ×3
CUFF TRNQT CYL 24X4X16.5-23 (TOURNIQUET CUFF) IMPLANT
DRAIN PENROSE 0.5X18 (DRAIN) IMPLANT
DRAPE SURG 17X11 SM STRL (DRAPES) ×2 IMPLANT
ELECT REM PT RETURN 15FT ADLT (MISCELLANEOUS) ×3 IMPLANT
GAUZE SPONGE 4X4 12PLY STRL (GAUZE/BANDAGES/DRESSINGS) ×1 IMPLANT
GLOVE BIOGEL M 8.0 STRL (GLOVE) ×2 IMPLANT
GLOVE BIOGEL PI IND STRL 6.5 (GLOVE) ×1 IMPLANT
GLOVE BIOGEL PI IND STRL 7.0 (GLOVE) ×1 IMPLANT
GLOVE BIOGEL PI IND STRL 7.5 (GLOVE) ×1 IMPLANT
GLOVE BIOGEL PI INDICATOR 6.5 (GLOVE) ×2
GLOVE BIOGEL PI INDICATOR 7.0 (GLOVE) ×2
GLOVE BIOGEL PI INDICATOR 7.5 (GLOVE) ×12
HANDPIECE INTERPULSE COAX TIP (DISPOSABLE)
KIT BASIN (CUSTOM PROCEDURE TRAY) ×3 IMPLANT
KIT TURNOVER KIT A (KITS) ×2 IMPLANT
NEEDLE HYPO 22GX1.5 SAFETY (NEEDLE) ×3 IMPLANT
NS IRRIG 1000ML POUR BTL (IV SOLUTION) ×3 IMPLANT
PACK ORTHO EXTREMITY (CUSTOM PROCEDURE TRAY) ×3 IMPLANT
PENCIL SMOKE EVACUATOR (MISCELLANEOUS) IMPLANT
PROTECTOR NERVE ULNAR (MISCELLANEOUS) ×1 IMPLANT
SET HNDPC FAN SPRY TIP SCT (DISPOSABLE) ×1 IMPLANT
STOCKINETTE 6  STRL (DRAPES) ×3
STOCKINETTE 6 STRL (DRAPES) ×1 IMPLANT
SUT ETHILON 3 0 PS 1 (SUTURE) ×2 IMPLANT
SUT ETHILON 4 0 PS 2 18 (SUTURE) ×2 IMPLANT
SUT ETHILON 5 0 PS 2 18 (SUTURE) ×2 IMPLANT
SUT FIBERWIRE 4-0 18 DIAM BLUE (SUTURE) ×3
SUTURE FIBERWR 4-0 18 DIA BLUE (SUTURE) IMPLANT
SWAB COLLECTION DEVICE MRSA (MISCELLANEOUS) IMPLANT
SWAB CULTURE ESWAB REG 1ML (MISCELLANEOUS) IMPLANT
SYR CONTROL 10ML LL (SYRINGE) ×3 IMPLANT

## 2020-02-03 NOTE — ED Provider Notes (Addendum)
[facility] DEPT Provider Note   CSN: [id number] Arrival date & time: 02/03/20  1317     History Chief Complaint  Patient presents with   Finger Injury    BUZZ VITULLI is a 69 y.o. male with a history of hypertension, hyperlipidemia, and GERD who presents to the emergency department status post left fifth finger injury which occurred 1 hour prior to arrival.  Patient states that he was wearing gloves and using electric drill when the drill got stuck in the glove and cut his fifth finger almost all the way off.  He states he had bleeding from the area.  It is mildly uncomfortable.  No alleviating or aggravating factors.  He states he does have some tingling to the digit distal to the wound.  Denies other areas of injury.  Patient is right-hand dominant.  He last ate at 7 AM when he had breakfast.  He is unsure of his definitive last tetanus vaccine.  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 (MOHS)   BPV (benign positional vertigo)    Colon polyps    GERD (gastroesophageal reflux disease)    Glucose intolerance (impaired glucose tolerance)    History of nuclear stress test    ETT-Myoview 2/18: EF 57, no ischemia, low risk   Hyperlipidemia    Hypertension    Right knee pain    SCC (squamous cell carcinoma) 02/24/2013   left inner forearm ([id number])   Vitreous floaters of both eyes     Patient Active Problem List   Diagnosis Date Noted   Fatigue 10/22/2016   Chest discomfort 10/22/2016    Past Surgical History:  Procedure Laterality Date   ARTHROSCOPIC REPAIR ACL Right    cardiac stress test  08/2008   COLONOSCOPY W/ POLYPECTOMY  2006   laser surgery of the eye         Family History  Problem Relation Age of Onset   CAD Mother    Diabetes Mother    Heart attack Mother 56     CVA Father    CVA Sister    CAD Brother    Diabetes Brother    Heart attack Brother 69   Heart failure Neg Hx     Social History   Tobacco Use   Smoking status: Never Smoker   Smokeless tobacco: Never Used  Substance Use Topics   Alcohol use: Yes    Comment: occasionally   Drug use: No    Home Medications Prior to Admission medications   Medication Sig Start Date End Date Taking? Authorizing Provider  aspirin EC 81 MG tablet Take 81 mg by mouth daily.    [provider]  atorvastatin (LIPITOR) 10 MG tablet Take 10 mg by mouth daily.    [provider]  losartan-hydrochlorothiazide (HYZAAR) 50-12.5 MG tablet Take 1 tablet by mouth daily.    [provider]  pantoprazole (PROTONIX) 40 MG tablet Take 40 mg by mouth daily.    [provider]    Allergies    Lisinopril  Review of Systems   Review of Systems  Constitutional: Negative for chills and fever.  Respiratory: Negative for shortness of breath.   Cardiovascular: Negative for chest pain.  Gastrointestinal: Negative for abdominal pain.  Musculoskeletal: Positive for arthralgias.  Skin: Positive for wound.  Neurological: Positive for  numbness. Negative for weakness.  All other systems reviewed and are negative.   Physical Exam Updated Vital Signs BP (!) 151/95    Pulse 71    Temp 98.1 F (36.7 C) (Oral)    Resp 19    SpO2 99%   Physical Exam Vitals and nursing note reviewed.  Constitutional:      General: He is not in acute distress.    Appearance: He is well-developed. He is not toxic-appearing.  HENT:     Head: Normocephalic and atraumatic.  Eyes:     General:        Right eye: No discharge.        Left eye: No discharge.     Conjunctiva/sclera: Conjunctivae normal.  Cardiovascular:     Rate and Rhythm: Normal rate and regular rhythm.     Comments: 2+ symmetric radial pulses. Pulmonary:     Effort: Pulmonary effort is normal. No respiratory distress.      Breath sounds: Normal breath sounds. No wheezing, rhonchi or rales.  Abdominal:     General: There is no distension.     Palpations: Abdomen is soft.     Tenderness: There is no abdominal tenderness.  Musculoskeletal:     Cervical back: Neck supple.     Comments: Left upper extremity: Patient has a laceration to the palmar surface of the left fifth DIP joint which extends to the radial aspect as well as to the dorsal aspect of the finger.  Distal phalanx is minimally intact to the digit and appears somewhat dusky.  Patient is held in the flexion position of the DIP joint concerning for flexor tendon injury.  There is mild bleeding which is controllable with application of pressure.  Patient is tender over the DIP joint.  Skin:    General: Skin is warm and dry.     Findings: No rash.  Neurological:     Mental Status: He is alert.     Comments: Clear speech.   Psychiatric:        Behavior: Behavior normal.               ED Results / Procedures / Treatments   Labs (all labs ordered are listed, but only abnormal results are displayed) Labs Reviewed - No data to display  EKG None  Radiology DG Finger Little Left  Result Date: 02/03/2020 CLINICAL DATA:  The patient suffered a left little finger injury using a drill today. Initial encounter. EXAM: LEFT LITTLE FINGER 2+V COMPARISON:  None. FINDINGS: There is a small fracture through the base of the distal phalanx of the little finger on the radial side. The radial side of the distal phalanx is rotated volarly off of the head of the middle phalanx. Soft tissue swelling is present. No radiopaque foreign body. Bandaging is noted. IMPRESSION: Partial dislocation of the distal phalanx of the little finger. The radial aspect of the distal phalanx is rotated volarly off the head of the middle phalanx. Small fracture through the radial margin of the base of the distal phalanx. Negative for foreign body. Electronically Signed   By: Inge Rise M.D.   On: 02/03/2020 15:05    Procedures Procedures (including critical care time)  Medications Ordered in ED Medications  ceFAZolin (ANCEF) IVPB 1 g/50 mL premix (has no administration in time range)  Tdap (BOOSTRIX) injection 0.5 mL (has no administration in time range)  fentaNYL (SUBLIMAZE) injection 50 mcg (has no administration in time range)    ED  Course  I have reviewed the triage vital signs and the nursing notes.  Pertinent labs & imaging results that were available during my care of the patient were reviewed by me and considered in my medical decision making (see chart for details).    VADIM EASTIN was evaluated in Emergency Department on 02/03/2020 for the symptoms described in the history of present illness. He/she was evaluated in the context of the global COVID-19 pandemic, which necessitated consideration that the patient might be at risk for infection with the SARS-CoV-2 virus that causes COVID-19. Institutional protocols and algorithms that pertain to the evaluation of patients at risk for COVID-19 are in a state of rapid change based on information released by regulatory bodies including the CDC and federal and state organizations. These policies and algorithms were followed during the patient's care in the ED.  MDM Rules/Calculators/A&P                      Patient presents to the ED with complaints of left 5th finger injury. Nontoxic, resting comfortably, vitals without significant abnormality.  Patient has wound to DIP joint which extends from the palmar aspect of the digit around to the dorsal aspect, appears to only be somewhat intact by the ulnar aspect of the digit.  Concern for open fracture/partial amputation as well as concern for decreased blood flow given dusky appearance.  Obvious flexor tendon injury.   ED Course:  Will give 1 g of Ancef, fentanyl for pain, and update tetanus.  Plan discussed with hand surgery.  14:35: CONSULT: Discussed with  Hilbert Odor PA-C with hand surgery Additional history obtained:  Additional history obtained from nursing note and chart review.  Imaging Studies ordered:  I ordered imaging studies which included L 5th finger, I independently visualized and interpreted imaging which showed Partial dislocation of the distal phalanx of the little finger. The radial aspect of the distal phalanx is rotated volarly off the head of the middle phalanx. Small fracture through the radial margin of the base of the distal phalanx. Negative for foreign body  15:33: Re-discussed with Alison Stalling PA-C- plan for OR.     This is a shared visit with supervising physician Dr. Roslynn Amble who has independently evaluated patient & provided guidance in evaluation/management/disposition, in agreement with care   Portions of this note were generated with Dragon dictation software. Dictation errors may occur despite best attempts at proofreading.   Final Clinical Impression(s) / ED Diagnoses Final diagnoses:  Dislocation of finger, initial encounter  Open displaced fracture of distal phalanx of left little finger, initial encounter    Rx / DC Orders ED Discharge Orders    None       Amaryllis Dyke, PA-C 02/03/20 1530   Kelly Eisler, [place] R, PA-C 02/03/20 1533    Amaryllis Dyke, PA-C 02/03/20 1539    Lucrezia Starch, MD 02/04/20 [phone]

## 2020-02-03 NOTE — Discharge Instructions (Signed)
Discharge Instructions:  Keep your dressing clean, dry and in place until instructed to remove by Dr. Lenon Curt.  If the dressing becomes dirty or wet call the office for instructions during business hours. Elevate the extremity to help with swelling, this will also help with any discomfort. Take your medication as prescribed. If you feel that the dressing is too tight, you may loosen it, but keep it on; finger tips should be pink; if there is a concern, call the office. [phone]  Please call the office on the next business day after discharge to arrange a follow up appointment.  Call [phone] between the hours of 9am - 5pm M-Th or 9am - 1pm on Fri.    General Anesthesia, Adult, Care After This sheet gives you information about how to care for yourself after your procedure. Your health care provider may also give you more specific instructions. If you have problems or questions, contact your health care provider. What can I expect after the procedure? After the procedure, the following side effects are common:  Pain or discomfort at the IV site.  Nausea.  Vomiting.  Sore throat.  Trouble concentrating.  Feeling cold or chills.  Weak or tired.  Sleepiness and fatigue.  Soreness and body aches. These side effects can affect parts of the body that were not involved in surgery. Follow these instructions at home:  For at least 24 hours after the procedure:  Have a responsible adult stay with you. It is important to have someone help care for you until you are awake and alert.  Rest as needed.  Do not: ? Participate in activities in which you could fall or become injured. ? Drive. ? Use heavy machinery. ? Drink alcohol. ? Take sleeping pills or medicines that cause drowsiness. ? Make important decisions or sign legal documents. ? Take care of children on your own. Eating and drinking  Follow any instructions from your health care provider about eating or drinking  restrictions.  When you feel hungry, start by eating small amounts of foods that are soft and easy to digest (bland), such as toast. Gradually return to your regular diet.  Drink enough fluid to keep your urine pale yellow.  If you vomit, rehydrate by drinking water, juice, or clear broth. General instructions  If you have sleep apnea, surgery and certain medicines can increase your risk for breathing problems. Follow instructions from your health care provider about wearing your sleep device: ? Anytime you are sleeping, including during daytime naps. ? While taking prescription pain medicines, sleeping medicines, or medicines that make you drowsy.  Return to your normal activities as told by your health care provider. Ask your health care provider what activities are safe for you.  Take over-the-counter and prescription medicines only as told by your health care provider.  If you smoke, do not smoke without supervision.  Keep all follow-up visits as told by your health care provider. This is important. Contact a health care provider if:  You have nausea or vomiting that does not get better with medicine.  You cannot eat or drink without vomiting.  You have pain that does not get better with medicine.  You are unable to pass urine.  You develop a skin rash.  You have a fever.  You have redness around your IV site that gets worse. Get help right away if:  You have difficulty breathing.  You have chest pain.  You have blood in your urine or stool, or  you vomit blood. Summary  After the procedure, it is common to have a sore throat or nausea. It is also common to feel tired.  Have a responsible adult stay with you for the first 24 hours after general anesthesia. It is important to have someone help care for you until you are awake and alert.  When you feel hungry, start by eating small amounts of foods that are soft and easy to digest (bland), such as toast. Gradually  return to your regular diet.  Drink enough fluid to keep your urine pale yellow.  Return to your normal activities as told by your health care provider. Ask your health care provider what activities are safe for you. This information is not intended to replace advice given to you by your health care provider. Make sure you discuss any questions you have with your health care provider. Document Revised: 08/22/2017 Document Reviewed: 04/04/2017 Elsevier Patient Education  [place].

## 2020-02-03 NOTE — ED Triage Notes (Signed)
Pt reports that that using an electric drill and cut 5th finger on left hand end almost all the way off. Pt has wrapped and splinted with wood.

## 2020-02-03 NOTE — H&P (Signed)
Reason for Consult:finger injury Referring Physician: ER  CC:I alomost cut my finger off  HPI:  David Jarvis is an 69 y.o. right handed male who presents with  near amputation of Left small finger.  Pt was operating a drill with a glove on and glove got caught in drill, twisting his left small finger.  Pt presented to ER for evaluation.  Pain is rated at   6 /10 and is described as sharp.  Pain is constant.  Pain is made better by rest/immobilization, worse with motion.   Associated signs/symptoms:denies other injuries Previous treatment:  n/a  Past Medical History:  Diagnosis Date  . 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 BPV (benign positional vertigo)   . Colon polyps   . GERD (gastroesophageal reflux disease)   . Glucose intolerance (impaired glucose tolerance)   . History of nuclear stress test    ETT-Myoview 2/18: EF 57, no ischemia, low risk  . Hyperlipidemia   . Hypertension   . Right knee pain   . SCC (squamous cell carcinoma) 02/24/2013   left inner forearm ([id number])  . Vitreous floaters of both eyes     Past Surgical History:  Procedure Laterality Date  . ARTHROSCOPIC REPAIR ACL Right   . cardiac stress test  08/2008  . COLONOSCOPY W/ POLYPECTOMY  2006  . laser surgery of the eye      Family History  Problem Relation Age of Onset  . CAD Mother   . Diabetes Mother   . Heart attack Mother 12  . CVA Father   . CVA Sister   . CAD Brother   . Diabetes Brother   . Heart attack Brother 58  . Heart failure Neg Hx     Social History:  reports that he has never smoked. He has never used smokeless tobacco. He reports current alcohol use. He reports that he does not use drugs.  Allergies:  Allergies  Allergen Reactions  . Lisinopril     Medications: I have reviewed the patient's current medications.  Results for orders placed or performed during the  hospital encounter of 02/03/20 (from the past 48 hour(s))  SARS Coronavirus 2 by RT PCR (hospital order, performed in [facility] hospital lab) Nasopharyngeal Nasopharyngeal Swab     Status: None   Collection Time: 02/03/20  3:29 PM   Specimen: Nasopharyngeal Swab  Result Value Ref Range   SARS Coronavirus 2 NEGATIVE NEGATIVE    Comment: (NOTE) SARS-CoV-2 target nucleic acids are NOT DETECTED. The SARS-CoV-2 RNA is generally detectable in upper and lower respiratory specimens during the acute phase of infection. The lowest concentration of SARS-CoV-2 viral copies this assay can detect is 250 copies / mL. A negative result does not preclude SARS-CoV-2 infection and should not be used as the sole basis for treatment or other patient management decisions.  A negative result may occur with improper specimen collection / handling, submission of specimen other than nasopharyngeal swab, presence of viral mutation(s) within the areas targeted by this assay, and inadequate number of viral copies (<250 copies / mL). A negative result must be combined with clinical observations, patient history, and epidemiological information. Fact Sheet for Patients:   StrictlyIdeas.no Fact Sheet for Healthcare Providers: BankingDealers.co.za This test is not yet approved or cleared  by the [place] FDA and has been authorized for detection and/or diagnosis of SARS-CoV-2 by FDA under an  Emergency Use Authorization (EUA).  This EUA will remain in effect (meaning this test can be used) for the duration of the COVID-19 declaration under Section 564(b)(1) of the Act, 21 U.S.C. section 360bbb-3(b)(1), unless the authorization is terminated or revoked sooner. Performed at [facility], [place] [address], [place], [place] [address]     DG Finger Little Left  Result Date: 02/03/2020 CLINICAL DATA:  The patient suffered a left little finger injury  using a drill today. Initial encounter. EXAM: LEFT LITTLE FINGER 2+V COMPARISON:  None. FINDINGS: There is a small fracture through the base of the distal phalanx of the little finger on the radial side. The radial side of the distal phalanx is rotated volarly off of the head of the middle phalanx. Soft tissue swelling is present. No radiopaque foreign body. Bandaging is noted. IMPRESSION: Partial dislocation of the distal phalanx of the little finger. The radial aspect of the distal phalanx is rotated volarly off the head of the middle phalanx. Small fracture through the radial margin of the base of the distal phalanx. Negative for foreign body. Electronically Signed   By: Inge Rise M.D.   On: 02/03/2020 15:05    Pertinent items are noted in HPI. Temp:  [98.1 F (36.7 C)] 98.1 F (36.7 C) (06/03 1331) Pulse Rate:  [50-71] 56 (06/03 1604) Resp:  [18-19] 18 (06/03 1600) BP: (148-174)/(88-98) 174/88 (06/03 1600) SpO2:  [96 %-99 %] 99 % (06/03 1604) Weight:  [74.8 kg] 74.8 kg (06/03 1700) General appearance: alert and cooperative Resp: clear to auscultation bilaterally Cardio: regular rate and rhythm GI: soft, non-tender; bowel sounds normal; no masses,  no organomegaly Extremities: extremities normal, atraumatic, no cyanosis or edema Except for left small finger with open fracture/dislocation, + bleeding, fingertip is bruised but seems pink? Significant laceration almost circumferential at level of dipj  Assessment: Near amputation of Left small finger, open fracture Plan: Will explore wound, attempt to salvage finger tip; discussed possibility of amputation with patient who understands. I have discussed this treatment plan in detail with patient, including the risks of the recommended treatment and surgery, the benefits and the alternatives.  The patient understands that additional treatment may be necessary.  Dshaun Reppucci C Aileana Hodder 02/03/2020, 5:21 PM

## 2020-02-03 NOTE — ED Notes (Signed)
Patients finger soaked cleansed in saline and betadine. Finger wrapped up tight with pressure to control bleeding.

## 2020-02-03 NOTE — Anesthesia Preprocedure Evaluation (Signed)
Anesthesia Evaluation  Patient identified by MRN, date of birth, ID band Patient awake    Reviewed: Allergy & Precautions, NPO status , Patient's Chart, lab work & pertinent test results  Airway Mallampati: I  TM Distance: >3 FB Neck ROM: Full    Dental   Pulmonary    Pulmonary exam normal        Cardiovascular hypertension, Normal cardiovascular exam     Neuro/Psych    GI/Hepatic GERD  Medicated and Controlled,  Endo/Other    Renal/GU      Musculoskeletal   Abdominal   Peds  Hematology   Anesthesia Other Findings   Reproductive/Obstetrics                             Anesthesia Physical Anesthesia Plan  ASA: II  Anesthesia Plan: General   Post-op Pain Management:    Induction: Intravenous, Rapid sequence and Cricoid pressure planned  PONV Risk Score and Plan: 2 and Ondansetron and Midazolam  Airway Management Planned: Oral ETT  Additional Equipment:   Intra-op Plan:   Post-operative Plan: Extubation in OR  Informed Consent: I have reviewed the patients History and Physical, chart, labs and discussed the procedure including the risks, benefits and alternatives for the proposed anesthesia with the patient or authorized representative who has indicated his/her understanding and acceptance.       Plan Discussed with: CRNA and Surgeon  Anesthesia Plan Comments:         Anesthesia Quick Evaluation

## 2020-02-03 NOTE — Transfer of Care (Signed)
Immediate Anesthesia Transfer of Care Note  Patient: David Jarvis  Procedure(s) Performed: Procedure(s): IRRIGATION AND DEBRIDEMENT EXTREMITY (Left)  Patient Location: PACU  Anesthesia Type:General  Level of Consciousness: Alert, Awake, Oriented  Airway & Oxygen Therapy: Patient Spontanous Breathing  Post-op Assessment: Report given to RN  Post vital signs: Reviewed and stable  Last Vitals:  Vitals:   02/03/20 1600 02/03/20 1604  BP: (!) 174/88   Pulse: (!) 50 (!) 56  Resp: 18   Temp:    SpO2: [id number]    Complications: No apparent anesthesia complications

## 2020-02-03 NOTE — Anesthesia Postprocedure Evaluation (Signed)
Anesthesia Post Note  Patient: David Jarvis  Procedure(s) Performed: IRRIGATION AND DEBRIDEMENT EXTREMITY (Left )     Patient location during evaluation: PACU Anesthesia Type: General Level of consciousness: awake and alert Pain management: pain level controlled Vital Signs Assessment: post-procedure vital signs reviewed and stable Respiratory status: spontaneous breathing, nonlabored ventilation, respiratory function stable and patient connected to nasal cannula oxygen Cardiovascular status: blood pressure returned to baseline and stable Postop Assessment: no apparent nausea or vomiting Anesthetic complications: no    Last Vitals:  Vitals:   02/03/20 1915 02/03/20 1950  BP: (!) 144/87 (!) 141/72  Pulse: 64 61  Resp: 16 15  Temp: 36.8 C 36.7 C  SpO2: 97% 98%    Last Pain:  Vitals:   02/03/20 1950  TempSrc:   PainSc: 0-No pain                 [name]

## 2020-02-03 NOTE — ED Notes (Signed)
Spoke with Pam at short stay. Patient will be going to OR as soon as Covid test is back in about 30 minutes.

## 2020-02-03 NOTE — Anesthesia Procedure Notes (Signed)
Procedure Name: Intubation Date/Time: 02/03/2020 5:46 PM Performed by: Lissa Morales, CRNA Pre-anesthesia Checklist: Patient identified, Emergency Drugs available, Suction available and Patient being monitored Patient Re-evaluated:Patient Re-evaluated prior to induction Oxygen Delivery Method: Circle system utilized Preoxygenation: Pre-oxygenation with 100% oxygen Induction Type: IV induction, Cricoid Pressure applied and Rapid sequence Laryngoscope Size: Mac and 4 Tube type: Oral Tube size: 7.5 mm Number of attempts: 1 Airway Equipment and Method: Stylet and Oral airway Placement Confirmation: ETT inserted through vocal cords under direct vision,  positive ETCO2 and breath sounds checked- equal and bilateral Secured at: 21 cm Tube secured with: Tape Dental Injury: Teeth and Oropharynx as per pre-operative assessment

## 2020-02-04 NOTE — Op Note (Signed)
NAME: David Jarvis, David Jarvis MEDICAL RECORD WJ:[id number] ACCOUNT [id number] DATE OF BIRTH:Mar 25, 1951 FACILITY: [facility] LOCATION: [facility] PHYSICIAN:Lennon Richins Vivien Presto, MD  OPERATIVE REPORT  DATE OF PROCEDURE:  02/03/2020  PREOPERATIVE DIAGNOSIS:  Near amputation and fracture dislocation of the left small finger.  POSTOPERATIVE DIAGNOSIS:  Near amputation and fracture dislocation of the left small finger.  PROCEDURE: 1.  Exploration of complex wound and near amputation of the left small finger. 2.  Repair of the radial collateral ligament to the DIP joint. 3.  Open reduction internal fixation of the fracture dislocation of the DIP joint with two 0.35 K-wires. 4.  Repair of terminal insertion of the extensor tendon. 5.  Closure of complex laceration totaling 4 cm.  INDICATIONS:  The patient is a 69 year old gentleman who was using a drill, had a glove on, the glove got caught in the drill and he sustained a near amputation of his left small finger over the middle and distal phalanx.  Upon evaluation, it was  questionable whether this fingertip could be salvaged.  Risks, benefits and alternatives of fixation and possible amputation were discussed with the patient.  He agreed with this course of action.  Consent was obtained.  DESCRIPTION OF PROCEDURE:  The patient was taken to the operating room and placed supine on the operating table.  Timeout was performed identifying the correct procedure.  General anesthesia was administered without difficulty.  Because the finger was  actively bleeding an Esmarch was used on the arm.  A tourniquet was placed on the upper arm and the tourniquet was inflated prior to prep.  The bandage was removed from the small finger, fingers and arm were prepped with Betadine solution.  The upper  extremity was then draped in the sterile fashion.  The finger was then evaluated.  There was a complex laceration to mostly the entire dorsal radial aspect of the finger; however, the  laceration continued along the volar aspect at the DIP flexion crease  to the mid portion or just to the ulnar side of the finger volarly.  Essentially, the finger was held in place by the flexor tendon and the ulnar collateral ligament as well as the ulnar neurovascular bundle.  The radial digital neurovascular bundle was  isolated and immediately it was felt that this was unable to be salvaged.  It appeared that both the artery and the nerve when they were caught in the drill, were twisted and pulled because the length of the distal aspect of the nerve and vessel appeared  to be stretched with the proximal end further down.  A small incision was made in the mid lateral line on the radial aspect of the finger and indeed the proximal portion of the digital vessel and nerve were located.  Afterwards, the wound was thoroughly  irrigated with irrigation solution.  There was a small chip fracture to the distal phalanx that was intra-articular.  However, most of the issue was an open joint and dislocation due to obliteration of the radial collateral ligament.  Two 0.035 K-wires  were driven in an antegrade fashion through the distal phalanx, the dislocation, fracture were reduced and the K-wires were driven in a retrograde fashion into the head of the middle phalanx.  Slight flexion of the distal phalanx was performed prior to  securing these K-wires to the middle phalanx head so if this joint, indeed fused, it would be fused in a more anatomic position.  Afterwards, the terminal portion of the extensor tendon was repaired to very  small portion of the extensor tendon that was  remaining with figure-of-eight 4-0 FiberWire.  In addition, the remnants of the radial collateral ligament was also repaired in a figure-of-eight fashion with 4-0 FiberWire.  Afterwards, the tourniquet was released.  With the radial digital artery  isolated, it was evident that the finger was perfused through the ulnar-sided vessels and  therefore repair of the radial side was not necessary, especially because of the damage to the neurovascular structures.  Bipolar was used to cauterize the vessel.   The nerve was trimmed back to what appeared to be healthy appearing nerve and placed in the soft tissue.  Afterwards, the skin was closed with multiple 5-0 nylon sutures for approximately 4 cm.  Afterwards, 10 mL of plain Marcaine was infiltrated around  the base of the finger for postoperative pain control.  Xeroform and a sterile dressing were placed.  The patient tolerated the procedure well and was taken to recovery room in stable condition.  CN/NUANCE  D:02/03/2020 T:02/04/2020 JOB:[id number]

## 2020-02-17 DIAGNOSIS — R69 Illness, unspecified: Secondary | ICD-10-CM | POA: Diagnosis not present

## 2020-02-18 DIAGNOSIS — E782 Mixed hyperlipidemia: Secondary | ICD-10-CM | POA: Diagnosis not present

## 2020-02-18 DIAGNOSIS — C61 Malignant neoplasm of prostate: Secondary | ICD-10-CM | POA: Diagnosis not present

## 2020-02-18 DIAGNOSIS — N400 Enlarged prostate without lower urinary tract symptoms: Secondary | ICD-10-CM | POA: Diagnosis not present

## 2020-02-18 DIAGNOSIS — I10 Essential (primary) hypertension: Secondary | ICD-10-CM | POA: Diagnosis not present

## 2020-02-21 DIAGNOSIS — K648 Other hemorrhoids: Secondary | ICD-10-CM | POA: Diagnosis not present

## 2020-02-21 DIAGNOSIS — K219 Gastro-esophageal reflux disease without esophagitis: Secondary | ICD-10-CM | POA: Diagnosis not present

## 2020-02-21 DIAGNOSIS — I10 Essential (primary) hypertension: Secondary | ICD-10-CM | POA: Diagnosis not present

## 2020-02-21 DIAGNOSIS — R7309 Other abnormal glucose: Secondary | ICD-10-CM | POA: Diagnosis not present

## 2020-02-21 DIAGNOSIS — C61 Malignant neoplasm of prostate: Secondary | ICD-10-CM | POA: Diagnosis not present

## 2020-02-21 DIAGNOSIS — E782 Mixed hyperlipidemia: Secondary | ICD-10-CM | POA: Diagnosis not present

## 2020-02-21 DIAGNOSIS — N400 Enlarged prostate without lower urinary tract symptoms: Secondary | ICD-10-CM | POA: Diagnosis not present

## 2020-02-24 DIAGNOSIS — S61217S Laceration without foreign body of left little finger without damage to nail, sequela: Secondary | ICD-10-CM | POA: Diagnosis not present

## 2020-03-07 DIAGNOSIS — N400 Enlarged prostate without lower urinary tract symptoms: Secondary | ICD-10-CM | POA: Diagnosis not present

## 2020-03-07 DIAGNOSIS — E782 Mixed hyperlipidemia: Secondary | ICD-10-CM | POA: Diagnosis not present

## 2020-03-07 DIAGNOSIS — I10 Essential (primary) hypertension: Secondary | ICD-10-CM | POA: Diagnosis not present

## 2020-03-07 DIAGNOSIS — C61 Malignant neoplasm of prostate: Secondary | ICD-10-CM | POA: Diagnosis not present

## 2020-03-10 DIAGNOSIS — H33303 Unspecified retinal break, bilateral: Secondary | ICD-10-CM | POA: Diagnosis not present

## 2020-03-10 DIAGNOSIS — H5213 Myopia, bilateral: Secondary | ICD-10-CM | POA: Diagnosis not present

## 2020-03-15 DIAGNOSIS — R69 Illness, unspecified: Secondary | ICD-10-CM | POA: Diagnosis not present

## 2020-03-20 DIAGNOSIS — D2339 Other benign neoplasm of skin of other parts of face: Secondary | ICD-10-CM | POA: Diagnosis not present

## 2020-04-03 DIAGNOSIS — R69 Illness, unspecified: Secondary | ICD-10-CM | POA: Diagnosis not present

## 2020-05-02 DIAGNOSIS — N400 Enlarged prostate without lower urinary tract symptoms: Secondary | ICD-10-CM | POA: Diagnosis not present

## 2020-05-02 DIAGNOSIS — E782 Mixed hyperlipidemia: Secondary | ICD-10-CM | POA: Diagnosis not present

## 2020-05-02 DIAGNOSIS — I10 Essential (primary) hypertension: Secondary | ICD-10-CM | POA: Diagnosis not present

## 2020-05-02 DIAGNOSIS — C61 Malignant neoplasm of prostate: Secondary | ICD-10-CM | POA: Diagnosis not present

## 2020-05-18 DIAGNOSIS — C61 Malignant neoplasm of prostate: Secondary | ICD-10-CM | POA: Diagnosis not present

## 2020-05-18 DIAGNOSIS — N400 Enlarged prostate without lower urinary tract symptoms: Secondary | ICD-10-CM | POA: Diagnosis not present

## 2020-05-18 DIAGNOSIS — E782 Mixed hyperlipidemia: Secondary | ICD-10-CM | POA: Diagnosis not present

## 2020-05-18 DIAGNOSIS — I10 Essential (primary) hypertension: Secondary | ICD-10-CM | POA: Diagnosis not present

## 2020-05-23 ENCOUNTER — Other Ambulatory Visit: Payer: Self-pay

## 2020-05-23 ENCOUNTER — Encounter: Payer: Self-pay | Admitting: Dermatology

## 2020-05-23 ENCOUNTER — Ambulatory Visit: Payer: Medicare HMO | Admitting: Dermatology

## 2020-05-23 DIAGNOSIS — C44719 Basal cell carcinoma of skin of left lower limb, including hip: Secondary | ICD-10-CM | POA: Diagnosis not present

## 2020-05-23 DIAGNOSIS — Z1283 Encounter for screening for malignant neoplasm of skin: Secondary | ICD-10-CM | POA: Diagnosis not present

## 2020-05-23 DIAGNOSIS — D0439 Carcinoma in situ of skin of other parts of face: Secondary | ICD-10-CM | POA: Diagnosis not present

## 2020-05-23 DIAGNOSIS — D485 Neoplasm of uncertain behavior of skin: Secondary | ICD-10-CM

## 2020-05-23 NOTE — Patient Instructions (Signed)

## 2020-05-29 ENCOUNTER — Telehealth: Payer: Self-pay | Admitting: *Deleted

## 2020-05-29 NOTE — Telephone Encounter (Signed)
-----   Message from Lavonna Monarch, MD sent at 05/27/2020  7:53 AM EDT ----- Schedule surgery with Dr. Darene Lamer

## 2020-05-29 NOTE — Telephone Encounter (Signed)
Pathology to patient, surgery scheduled  

## 2020-06-19 DIAGNOSIS — N400 Enlarged prostate without lower urinary tract symptoms: Secondary | ICD-10-CM | POA: Diagnosis not present

## 2020-06-19 DIAGNOSIS — I10 Essential (primary) hypertension: Secondary | ICD-10-CM | POA: Diagnosis not present

## 2020-06-19 DIAGNOSIS — C61 Malignant neoplasm of prostate: Secondary | ICD-10-CM | POA: Diagnosis not present

## 2020-06-19 DIAGNOSIS — E782 Mixed hyperlipidemia: Secondary | ICD-10-CM | POA: Diagnosis not present

## 2020-06-22 ENCOUNTER — Other Ambulatory Visit: Payer: Self-pay

## 2020-06-22 ENCOUNTER — Encounter: Payer: Self-pay | Admitting: Dermatology

## 2020-06-22 ENCOUNTER — Ambulatory Visit (INDEPENDENT_AMBULATORY_CARE_PROVIDER_SITE_OTHER): Payer: Medicare HMO | Admitting: Dermatology

## 2020-06-22 DIAGNOSIS — C44719 Basal cell carcinoma of skin of left lower limb, including hip: Secondary | ICD-10-CM

## 2020-06-22 DIAGNOSIS — D0439 Carcinoma in situ of skin of other parts of face: Secondary | ICD-10-CM

## 2020-06-22 DIAGNOSIS — C4491 Basal cell carcinoma of skin, unspecified: Secondary | ICD-10-CM

## 2020-06-22 DIAGNOSIS — D099 Carcinoma in situ, unspecified: Secondary | ICD-10-CM

## 2020-06-22 NOTE — Patient Instructions (Signed)

## 2020-06-22 NOTE — Progress Notes (Addendum)
   Follow-Up Visit   Subjective  David Jarvis is a 69 y.o. male who presents for the following: Annual Exam (RIGHT TEMPLE PERV TREATMENT PER PT, LEFT CAL NONHEALING X MONTHS OR YEARS).  Annual skin exam. Location:  Duration:  Quality:  Associated Signs/Symptoms: Modifying Factors:  Severity:  Timing: Context:   Objective  Well appearing patient in no apparent distress; mood and affect are within normal limits.  A full examination was performed including scalp, head, eyes, ears, nose, lips, neck, chest, axillae, abdomen, back, buttocks, bilateral upper extremities, bilateral lower extremities, hands, feet, fingers, toes, fingernails, and toenails. All findings within normal limits unless otherwise noted below.   Assessment & Plan    Neoplasm of uncertain behavior of skin (2) Left Calf - Posterior  Skin / nail biopsy Type of biopsy: tangential   Informed consent: discussed and consent obtained   Timeout: patient name, date of birth, surgical site, and procedure verified   Procedure prep:  Patient was prepped and draped in usual sterile fashion (Non sterile) Prep type:  Chlorhexidine Anesthesia: the lesion was anesthetized in a standard fashion   Anesthetic:  1% lidocaine w/ epinephrine 1-100,000 local infiltration Instrument used: flexible razor blade   Outcome: patient tolerated procedure well   Post-procedure details: wound care instructions given    Specimen 1 - Surgical pathology Differential Diagnosis: BCC SCC Check Margins: No  Right Temple  Skin / nail biopsy Type of biopsy: tangential   Informed consent: discussed and consent obtained   Timeout: patient name, date of birth, surgical site, and procedure verified   Procedure prep:  Patient was prepped and draped in usual sterile fashion (Non sterile) Prep type:  Chlorhexidine Anesthesia: the lesion was anesthetized in a standard fashion   Anesthetic:  1% lidocaine w/ epinephrine 1-100,000 local  infiltration Instrument used: flexible razor blade   Outcome: patient tolerated procedure well   Post-procedure details: wound care instructions given    Specimen 2 - Surgical pathology Differential Diagnosis: BCC SCC Check Margins: No  Encounter for screening for malignant neoplasm of skin Right Breast  Yearly skin check Skin cancer screening performed today.     I, Lavonna Monarch, MD, have reviewed all documentation for this visit.  The documentation on 07/07/20 for the exam, diagnosis, procedures, and orders are all accurate and complete.

## 2020-06-25 ENCOUNTER — Encounter: Payer: Self-pay | Admitting: Dermatology

## 2020-07-13 DIAGNOSIS — N400 Enlarged prostate without lower urinary tract symptoms: Secondary | ICD-10-CM | POA: Diagnosis not present

## 2020-07-13 DIAGNOSIS — C61 Malignant neoplasm of prostate: Secondary | ICD-10-CM | POA: Diagnosis not present

## 2020-07-13 DIAGNOSIS — I10 Essential (primary) hypertension: Secondary | ICD-10-CM | POA: Diagnosis not present

## 2020-07-13 DIAGNOSIS — K219 Gastro-esophageal reflux disease without esophagitis: Secondary | ICD-10-CM | POA: Diagnosis not present

## 2020-07-13 DIAGNOSIS — E782 Mixed hyperlipidemia: Secondary | ICD-10-CM | POA: Diagnosis not present

## 2020-08-04 NOTE — Progress Notes (Signed)
   Follow-Up Visit   Subjective  David Jarvis is a 69 y.o. male who presents for the following: Procedure (Patient here today for treatment BCC x 1 Left Calf Posterior and CIS x 1 Right Temple).  Skin cancers Location: Face and leg Duration:  Quality:  Associated Signs/Symptoms: Modifying Factors:  Severity:  Timing: Context: For treatment  Objective  Well appearing patient in no apparent distress; mood and affect are within normal limits.  A focused examination was performed including Head, neck, arms, legs.. Relevant physical exam findings are noted in the Assessment and Plan.   Assessment & Plan    Basal cell carcinoma (BCC), unspecified site Left Lower Leg - Posterior  Destruction of lesion Complexity: simple   Destruction method: electrodesiccation and curettage   Informed consent: discussed and consent obtained   Timeout:  patient name, date of birth, surgical site, and procedure verified Anesthesia: the lesion was anesthetized in a standard fashion   Anesthetic:  1% lidocaine w/ epinephrine 1-100,000 local infiltration Curettage performed in three different directions: Yes   Curettage cycles:  3 Lesion length (cm):  1.6 Lesion width (cm):  1 Margin per side (cm):  0 Final wound size (cm):  1.6 Hemostasis achieved with:  ferric subsulfate Outcome: patient tolerated procedure well with no complications   Post-procedure details: sterile dressing applied and wound care instructions given   Dressing type: petrolatum gauze and bandage   Additional details:  Wound innoculated with 5 fluorouracil solution.  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  Destruction of lesion Complexity: simple   Destruction method: electrodesiccation and curettage   Informed consent: discussed and consent obtained   Timeout:  patient name, date of birth, surgical site, and procedure verified Anesthesia: the lesion was anesthetized in a standard fashion   Anesthetic:  1%  lidocaine w/ epinephrine 1-100,000 local infiltration Curettage performed in three different directions: Yes   Curettage cycles:  3 Lesion length (cm):  1 Lesion width (cm):  1 Margin per side (cm):  0 Final wound size (cm):  1 Hemostasis achieved with:  ferric subsulfate Outcome: patient tolerated procedure well with no complications   Additional details:  Wound innoculated with 5 fluorouracil solution.     I, Lavonna Monarch, MD, have reviewed all documentation for this visit.  The documentation on 08/04/20 for the exam, diagnosis, procedures, and orders are all accurate and complete.

## 2020-08-11 DIAGNOSIS — C61 Malignant neoplasm of prostate: Secondary | ICD-10-CM | POA: Diagnosis not present

## 2020-08-15 DIAGNOSIS — K219 Gastro-esophageal reflux disease without esophagitis: Secondary | ICD-10-CM | POA: Diagnosis not present

## 2020-08-15 DIAGNOSIS — N400 Enlarged prostate without lower urinary tract symptoms: Secondary | ICD-10-CM | POA: Diagnosis not present

## 2020-08-15 DIAGNOSIS — I10 Essential (primary) hypertension: Secondary | ICD-10-CM | POA: Diagnosis not present

## 2020-08-15 DIAGNOSIS — E782 Mixed hyperlipidemia: Secondary | ICD-10-CM | POA: Diagnosis not present

## 2020-08-15 DIAGNOSIS — C61 Malignant neoplasm of prostate: Secondary | ICD-10-CM | POA: Diagnosis not present

## 2020-08-18 DIAGNOSIS — C61 Malignant neoplasm of prostate: Secondary | ICD-10-CM | POA: Diagnosis not present

## 2020-08-18 DIAGNOSIS — R351 Nocturia: Secondary | ICD-10-CM | POA: Diagnosis not present

## 2020-08-18 DIAGNOSIS — N5201 Erectile dysfunction due to arterial insufficiency: Secondary | ICD-10-CM | POA: Diagnosis not present

## 2020-08-18 DIAGNOSIS — N401 Enlarged prostate with lower urinary tract symptoms: Secondary | ICD-10-CM | POA: Diagnosis not present

## 2020-09-25 DIAGNOSIS — K5900 Constipation, unspecified: Secondary | ICD-10-CM | POA: Diagnosis not present

## 2020-09-25 DIAGNOSIS — R630 Anorexia: Secondary | ICD-10-CM | POA: Diagnosis not present

## 2020-09-25 DIAGNOSIS — Z1211 Encounter for screening for malignant neoplasm of colon: Secondary | ICD-10-CM | POA: Diagnosis not present

## 2020-09-25 DIAGNOSIS — R5383 Other fatigue: Secondary | ICD-10-CM | POA: Diagnosis not present

## 2020-09-25 DIAGNOSIS — R1013 Epigastric pain: Secondary | ICD-10-CM | POA: Diagnosis not present

## 2020-09-26 ENCOUNTER — Other Ambulatory Visit: Payer: Self-pay | Admitting: Internal Medicine

## 2020-09-26 DIAGNOSIS — R1013 Epigastric pain: Secondary | ICD-10-CM

## 2020-09-26 DIAGNOSIS — R7989 Other specified abnormal findings of blood chemistry: Secondary | ICD-10-CM

## 2020-09-26 DIAGNOSIS — R748 Abnormal levels of other serum enzymes: Secondary | ICD-10-CM

## 2020-09-29 DIAGNOSIS — R109 Unspecified abdominal pain: Secondary | ICD-10-CM | POA: Diagnosis not present

## 2020-10-02 ENCOUNTER — Other Ambulatory Visit: Payer: Self-pay | Admitting: Internal Medicine

## 2020-10-02 DIAGNOSIS — I10 Essential (primary) hypertension: Secondary | ICD-10-CM | POA: Diagnosis not present

## 2020-10-02 DIAGNOSIS — R7989 Other specified abnormal findings of blood chemistry: Secondary | ICD-10-CM | POA: Diagnosis not present

## 2020-10-02 DIAGNOSIS — K831 Obstruction of bile duct: Secondary | ICD-10-CM

## 2020-10-02 DIAGNOSIS — E782 Mixed hyperlipidemia: Secondary | ICD-10-CM | POA: Diagnosis not present

## 2020-10-02 DIAGNOSIS — C61 Malignant neoplasm of prostate: Secondary | ICD-10-CM | POA: Diagnosis not present

## 2020-10-02 DIAGNOSIS — R1013 Epigastric pain: Secondary | ICD-10-CM | POA: Diagnosis not present

## 2020-10-02 DIAGNOSIS — R748 Abnormal levels of other serum enzymes: Secondary | ICD-10-CM | POA: Diagnosis not present

## 2020-10-02 DIAGNOSIS — K219 Gastro-esophageal reflux disease without esophagitis: Secondary | ICD-10-CM | POA: Diagnosis not present

## 2020-10-02 DIAGNOSIS — N400 Enlarged prostate without lower urinary tract symptoms: Secondary | ICD-10-CM | POA: Diagnosis not present

## 2020-10-04 DIAGNOSIS — R748 Abnormal levels of other serum enzymes: Secondary | ICD-10-CM | POA: Diagnosis not present

## 2020-10-04 DIAGNOSIS — R52 Pain, unspecified: Secondary | ICD-10-CM | POA: Diagnosis not present

## 2020-10-04 DIAGNOSIS — R509 Fever, unspecified: Secondary | ICD-10-CM | POA: Diagnosis not present

## 2020-10-04 DIAGNOSIS — R6883 Chills (without fever): Secondary | ICD-10-CM | POA: Diagnosis not present

## 2020-10-05 ENCOUNTER — Other Ambulatory Visit: Payer: Self-pay

## 2020-10-05 ENCOUNTER — Observation Stay (HOSPITAL_COMMUNITY)
Admission: AD | Admit: 2020-10-05 | Discharge: 2020-10-06 | Disposition: A | Discharge status: Home / Self Care | Payer: Medicare HMO | Source: Ambulatory Visit | Attending: Internal Medicine | Admitting: Internal Medicine

## 2020-10-05 ENCOUNTER — Observation Stay (HOSPITAL_COMMUNITY): Payer: Medicare HMO

## 2020-10-05 DIAGNOSIS — K859 Acute pancreatitis without necrosis or infection, unspecified: Secondary | ICD-10-CM

## 2020-10-05 DIAGNOSIS — E785 Hyperlipidemia, unspecified: Secondary | ICD-10-CM | POA: Diagnosis present

## 2020-10-05 DIAGNOSIS — Z20822 Contact with and (suspected) exposure to covid-19: Secondary | ICD-10-CM | POA: Diagnosis not present

## 2020-10-05 DIAGNOSIS — N400 Enlarged prostate without lower urinary tract symptoms: Secondary | ICD-10-CM | POA: Diagnosis present

## 2020-10-05 DIAGNOSIS — R109 Unspecified abdominal pain: Secondary | ICD-10-CM | POA: Diagnosis present

## 2020-10-05 DIAGNOSIS — R748 Abnormal levels of other serum enzymes: Secondary | ICD-10-CM | POA: Diagnosis present

## 2020-10-05 DIAGNOSIS — K838 Other specified diseases of biliary tract: Secondary | ICD-10-CM | POA: Diagnosis present

## 2020-10-05 DIAGNOSIS — I10 Essential (primary) hypertension: Secondary | ICD-10-CM | POA: Diagnosis present

## 2020-10-05 DIAGNOSIS — Z79899 Other long term (current) drug therapy: Secondary | ICD-10-CM | POA: Insufficient documentation

## 2020-10-05 DIAGNOSIS — K219 Gastro-esophageal reflux disease without esophagitis: Secondary | ICD-10-CM | POA: Diagnosis not present

## 2020-10-05 DIAGNOSIS — R1013 Epigastric pain: Principal | ICD-10-CM | POA: Diagnosis present

## 2020-10-05 DIAGNOSIS — Z7901 Long term (current) use of anticoagulants: Secondary | ICD-10-CM | POA: Insufficient documentation

## 2020-10-05 DIAGNOSIS — R932 Abnormal findings on diagnostic imaging of liver and biliary tract: Secondary | ICD-10-CM | POA: Insufficient documentation

## 2020-10-05 DIAGNOSIS — R7401 Elevation of levels of liver transaminase levels: Secondary | ICD-10-CM | POA: Diagnosis present

## 2020-10-05 DIAGNOSIS — R945 Abnormal results of liver function studies: Secondary | ICD-10-CM | POA: Diagnosis not present

## 2020-10-05 LAB — COMPREHENSIVE METABOLIC PANEL
ALT: 218 U/L — ABNORMAL HIGH (ref 0–44)
AST: 152 U/L — ABNORMAL HIGH (ref 15–41)
Albumin: 3.9 g/dL (ref 3.5–5.0)
Alkaline Phosphatase: 331 U/L — ABNORMAL HIGH (ref 38–126)
Anion gap: 9 (ref 5–15)
BUN: 15 mg/dL (ref 8–23)
CO2: 24 mmol/L (ref 22–32)
Calcium: 8.8 mg/dL — ABNORMAL LOW (ref 8.9–10.3)
Chloride: 106 mmol/L (ref 98–111)
Creatinine, Ser: 1.19 mg/dL (ref 0.61–1.24)
GFR, Estimated: >60 mL/min (ref >60)
Glucose, Bld: 111 mg/dL — ABNORMAL HIGH (ref 70–99)
Potassium: 4.1 mmol/L (ref 3.5–5.1)
Sodium: 139 mmol/L (ref 135–145)
Total Bilirubin: 1.2 mg/dL (ref 0.3–1.2)
Total Protein: 6.8 g/dL (ref 6.5–8.1)

## 2020-10-05 LAB — CBC WITH DIFFERENTIAL/PLATELET
Abs Immature Granulocytes: 0.01 10*3/uL (ref 0.00–0.07)
Basophils Absolute: 0 10*3/uL (ref 0.0–0.1)
Basophils Relative: 1 %
Eosinophils Absolute: 0.1 10*3/uL (ref 0.0–0.5)
Eosinophils Relative: 2 %
HCT: 41 % (ref 39.0–52.0)
Hemoglobin: 13.5 g/dL (ref 13.0–17.0)
Immature Granulocytes: 0 %
Lymphocytes Relative: 34 %
Lymphs Abs: 1 10*3/uL (ref 0.7–4.0)
MCH: 28.8 pg (ref 26.0–34.0)
MCHC: 32.9 g/dL (ref 30.0–36.0)
MCV: 87.6 fL (ref 80.0–100.0)
Monocytes Absolute: 0.5 10*3/uL (ref 0.1–1.0)
Monocytes Relative: 18 %
Neutro Abs: 1.3 10*3/uL — ABNORMAL LOW (ref 1.7–7.7)
Neutrophils Relative %: 45 %
Platelets: 147 10*3/uL — ABNORMAL LOW (ref 150–400)
RBC: 4.68 MIL/uL (ref 4.22–5.81)
RDW: 13.5 % (ref 11.5–15.5)
WBC: 2.9 10*3/uL — ABNORMAL LOW (ref 4.0–10.5)
nRBC: 0 % (ref 0.0–0.2)

## 2020-10-05 LAB — LIPASE, BLOOD: Lipase: 280 U/L — ABNORMAL HIGH (ref 11–51)

## 2020-10-05 LAB — HIV ANTIBODY (ROUTINE TESTING W REFLEX): HIV Screen 4th Generation wRfx: NONREACTIVE

## 2020-10-05 LAB — SARS CORONAVIRUS 2 (TAT 6-24 HRS): SARS Coronavirus 2: NEGATIVE

## 2020-10-05 MED ORDER — SODIUM CHLORIDE 0.9 % IV SOLN: INTRAVENOUS | Status: DC

## 2020-10-05 MED ORDER — ONDANSETRON HCL 4 MG/2ML IJ SOLN: 4.0000 mg | Freq: Four times a day (QID) | INTRAMUSCULAR | Status: DC | PRN

## 2020-10-05 MED ORDER — ONDANSETRON HCL 4 MG PO TABS: 4.0000 mg | ORAL_TABLET | Freq: Four times a day (QID) | ORAL | Status: DC | PRN

## 2020-10-05 MED ORDER — PANTOPRAZOLE SODIUM 40 MG PO TBEC
40.0000 mg | DELAYED_RELEASE_TABLET | Freq: Every day | ORAL | Status: DC
  Administered 2020-10-05: 40 mg via ORAL
  Filled 2020-10-05: qty 1

## 2020-10-05 MED ORDER — GADOBUTROL 1 MMOL/ML IV SOLN
7.0000 mL | Freq: Once | INTRAVENOUS | Status: AC | PRN
  Administered 2020-10-05: 7 mL via INTRAVENOUS

## 2020-10-05 NOTE — Consult Note (Signed)
Referring Provider: [facility] Primary Care Physician:  Wenda Low, MD Sadie Haber)  Reason for Consultation: Pancreatitis biliary obstruction  HPI: David Jarvis is a 70 y.o. male with past medical history of hypertension presenting for consultation of pancreatitis with suspected biliary obstruction.  Patient started experiencing epigastric abdominal pain over the last several days.  He saw his PCP on 2/2 and was found to have lipase of 968, as well as abnormal LFTs.  He continues to have epigastric abdominal pain, currently a 4/10, though worse with eating.  He had myalgias initially, but this has resolved.  He denies any nausea or vomiting.  Further denies changes in bowel habits, melena, or hematochezia.  States he typically only has a bowel movement every 2 to 3 days.  Denies changes in appetite or unexplained weight loss.  He takes 81 mg aspirin daily but otherwise denies NSAID or blood thinner use.  He drinks 1-2 alcoholic beverages (beer or wine) per evening.  Patient denies family history of liver, pancreatic, or other gastrointestinal disorders.  Denies family history of colon cancer or other gastrointestinal malignancies.  Last colonoscopy over 10 years ago.  Past Medical History:  Diagnosis Date  . 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 BPV (benign positional vertigo)   . Colon polyps   . GERD (gastroesophageal reflux disease)   . Glucose intolerance (impaired glucose tolerance)   . History of nuclear stress test    ETT-Myoview 2/18: EF 57, no ischemia, low risk  . Hyperlipidemia   . Hypertension   . Melanoma ([place]) 07/22/2008   RIGHT TEMPLE END STAGE LICHENORO REGRESSION MELANODERMA TX MOHS  . Right knee pain   . SCC (squamous cell carcinoma) 02/24/2013   left inner forearm (Cx35FU)  . Vitreous floaters of both eyes     Past Surgical History:  Procedure Laterality Date  .  ARTHROSCOPIC REPAIR ACL Right   . cardiac stress test  08/2008  . COLONOSCOPY W/ POLYPECTOMY  2006  . I & D EXTREMITY Left 02/03/2020   Procedure: IRRIGATION AND DEBRIDEMENT EXTREMITY;  Surgeon: Dayna Barker, MD;  Location: [facility];  Service: Plastics;  Laterality: Left;  . laser surgery of the eye      Prior to Admission medications   Medication Sig Start Date End Date Taking? Authorizing Provider  aspirin EC 81 MG tablet Take 81 mg by mouth daily.   Yes [provider]  atorvastatin (LIPITOR) 10 MG tablet Take 10 mg by mouth every evening.    Yes [provider]  pantoprazole (PROTONIX) 40 MG tablet Take 40 mg by mouth daily.   Yes [provider]  sildenafil (REVATIO) 20 MG tablet Take 20 mg by mouth daily as needed (erectile dysfunction). 08/18/20  Yes [provider]  tamsulosin (FLOMAX) 0.4 MG CAPS capsule Take 0.4 mg by mouth daily. 11/30/19  Yes [provider]  valsartan-hydrochlorothiazide (DIOVAN-HCT) 80-12.5 MG tablet Take 0.5 tablets by mouth daily. 11/30/19  Yes [provider]    Scheduled Meds: . pantoprazole  40 mg Oral Daily   Continuous Infusions: PRN Meds:.gadobutrol, ondansetron **OR** ondansetron (ZOFRAN) IV  Allergies as of 10/05/2020 - Review Complete 10/05/2020  Allergen Reaction Noted  . Lisinopril Cough 10/22/2016    Family History  Problem Relation Age of Onset  . CAD Mother   . Diabetes Mother   . Heart attack Mother 67  . CVA Father   .  CVA Sister   . CAD Brother   . Diabetes Brother   . Heart attack Brother 19  . Heart failure Neg Hx     Social History   Socioeconomic History  . Marital status: Unknown    Spouse name: Not on file  . Number of children: Not on file  . Years of education: Not on file  . Highest education level: Not on file  Occupational History  . Not on file  Tobacco Use  . Smoking status: Never Smoker  . Smokeless tobacco: Never Used  Vaping Use  . Vaping Use:  Never used  Substance and Sexual Activity  . Alcohol use: Yes    Comment: occasionally  . Drug use: No  . Sexual activity: Yes    Comment: married  Other Topics Concern  . Not on file  Social History Narrative   Accountant   Married.  2 daughters.   Lived in [place] for many years.  Born in [place].   [employer]; [place].   Social Determinants of Health   Financial Resource Strain: Not on file  Food Insecurity: Not on file  Transportation Needs: Not on file  Physical Activity: Not on file  Stress: Not on file  Social Connections: Not on file  Intimate Partner Violence: Not on file    Review of Systems: Review of Systems  Constitutional: Negative for chills and fever.  HENT: Negative for hearing loss and tinnitus.   Eyes: Negative for discharge and redness.  Respiratory: Negative for cough and shortness of breath.   Cardiovascular: Negative for chest pain and palpitations.  Gastrointestinal: Positive for abdominal pain. Negative for blood in stool, constipation, diarrhea, heartburn, melena, nausea and vomiting.  Genitourinary: Negative for flank pain and hematuria.  Musculoskeletal: Positive for myalgias. Negative for falls.  Skin: Negative for itching and rash.  Neurological: Negative for seizures and loss of consciousness.  Endo/Heme/Allergies: Negative for polydipsia. Does not bruise/bleed easily.  Psychiatric/Behavioral: Negative for substance abuse. The patient is not nervous/anxious.      Physical Exam: Vital signs: Vitals:   10/05/20 1037  BP: 116/76  Pulse: (!) 57  Resp: 18  Temp: (!) 97.5 F (36.4 C)  SpO2: 100%     Physical Exam Vitals reviewed.  Constitutional:      General: He is not in acute distress.    Appearance: Normal appearance.  HENT:     Head: Normocephalic and atraumatic.     Nose: Nose normal. No congestion.     Mouth/Throat:     Mouth: Mucous membranes are moist.     Pharynx: Oropharynx is clear.   Eyes:     General: No scleral icterus.    Extraocular Movements: Extraocular movements intact.     Conjunctiva/sclera: Conjunctivae normal.  Cardiovascular:     Rate and Rhythm: Normal rate and regular rhythm.     Pulses: Normal pulses.     Heart sounds: Normal heart sounds.  Pulmonary:     Effort: Pulmonary effort is normal. No respiratory distress.     Breath sounds: Normal breath sounds.  Abdominal:     General: Bowel sounds are normal. There is no distension.     Palpations: Abdomen is soft. There is no mass.     Tenderness: There is abdominal tenderness (mild epigastric and RUQ). There is no guarding or rebound.     Hernia: No hernia is present.  Musculoskeletal:        General: No swelling or tenderness.  Cervical back: Normal range of motion and neck supple.  Skin:    General: Skin is warm and dry.     Coloration: Skin is not jaundiced.  Neurological:     General: No focal deficit present.     Mental Status: He is alert and oriented to person, place, and time.  Psychiatric:        Mood and Affect: Mood normal.        Behavior: Behavior normal. Behavior is cooperative.      GI:  Lab Results: Recent Labs    10/05/20 1145  WBC 2.9*  HGB 13.5  HCT 41.0  PLT 147*   BMET Recent Labs    10/05/20 1145  NA 139  K 4.1  CL 106  CO2 24  GLUCOSE 111*  BUN 15  CREATININE 1.19  CALCIUM 8.8*   LFT Recent Labs    10/05/20 1145  PROT 6.8  ALBUMIN 3.9  AST 152*  ALT 218*  ALKPHOS 331*  BILITOT 1.2   PT/INR No results for input(s): LABPROT, INR in the last 72 hours.   Studies/Results: No results found.  Impression: Pancreatitis, likely due to biliary obstruction.  Most likely choledocholithiasis versus passed CBD stone.  Less likely, pancreatic or biliary malignancy. -Today, lipase 280, improved from 968 yesterday -Today T bili 1.2/AST 152/ALT 218/ALP 331, slightly improved from yesterday, T. Bili 1.9/ AST 142/ ALT 220/ ALP 361 -No leukocytosis.   WBCs mildly decreased (2.9). -Normal renal function: BUN 15/Cr 1.19  Plan: MRI/MRCP today.  NPO after midnight if MRCP is positive for CBD stone in anticipation of ERCP.  Consider surgical consult for lap chole pending findings.  Continue to trend LFTs.  Initiate IV fluids at a rate of 150 cc/h.  Clear liquid diet okay from GI standpoint.    [facility] will follow.   LOS: 1 day   Fount Bahe  PA-C 10/05/2020, 12:55 PM  Contact #  [phone]

## 2020-10-05 NOTE — H&P (Addendum)
History and Physical    David Jarvis:[id number] DOB: November 03, 1950 DOA: 10/05/2020  PCP: Wenda Low, MD  Patient coming from: Home  Chief Complaint: abdominal pain  HPI: David Jarvis is a 70 y.o. male with medical history significant of HTN, HLD, GERD. Presenting with epigastric abdominal pain. Reports symptoms began 1 week ago. Dull epigastric pain that is worsened with eating and deep breathing. He has also had fatigue during this time. He has tried avoiding heavy foods to help his symptoms. He was evaluated by his PCP who tried to get imaging arrange, but was unable to for at least the next week. They spoke with GI who recommended hospitalization for work up. He was called back today and told to come to the hospital due to abnormal lipase. [facility] was called for admission.     Review of Systems:  Denies CP, palpitations, dyspnea, N/V/D/F, hematochezia, hematemesis. Review of systems is otherwise negative for all not mentioned in HPI.   PMHx Past Medical History:  Diagnosis Date  . 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 BPV (benign positional vertigo)   . Colon polyps   . GERD (gastroesophageal reflux disease)   . Glucose intolerance (impaired glucose tolerance)   . History of nuclear stress test    ETT-Myoview 2/18: EF 57, no ischemia, low risk  . Hyperlipidemia   . Hypertension   . Melanoma ([place]) 07/22/2008   RIGHT TEMPLE END STAGE LICHENORO REGRESSION MELANODERMA TX MOHS  . Right knee pain   . SCC (squamous cell carcinoma) 02/24/2013   left inner forearm (Cx35FU)  . Vitreous floaters of both eyes     PSHx Past Surgical History:  Procedure Laterality Date  . ARTHROSCOPIC REPAIR ACL Right   . cardiac stress test  08/2008  . COLONOSCOPY W/ POLYPECTOMY  2006  . I & D EXTREMITY Left 02/03/2020   Procedure: IRRIGATION AND DEBRIDEMENT EXTREMITY;  Surgeon: Dayna Barker,  MD;  Location: [facility];  Service: Plastics;  Laterality: Left;  . laser surgery of the eye      SocHx  reports that he has never smoked. He has never used smokeless tobacco. He reports current alcohol use. He reports that he does not use drugs.  Allergies  Allergen Reactions  . Lisinopril     FamHx Family History  Problem Relation Age of Onset  . CAD Mother   . Diabetes Mother   . Heart attack Mother 81  . CVA Father   . CVA Sister   . CAD Brother   . Diabetes Brother   . Heart attack Brother 104  . Heart failure Neg Hx     Prior to Admission medications   Medication Sig Start Date End Date Taking? Authorizing Provider  aspirin EC 81 MG tablet Take 81 mg by mouth daily.    [provider]  atorvastatin (LIPITOR) 10 MG tablet Take 10 mg by mouth every evening.     [provider]  pantoprazole (PROTONIX) 40 MG tablet Take 40 mg by mouth daily.    [provider]  tamsulosin (FLOMAX) 0.4 MG CAPS capsule Take 0.4 mg by mouth daily. 11/30/19   [provider]  traMADol Veatrice Bourbon) 50 MG tablet Take by mouth. 02/24/20   [provider]  valsartan-hydrochlorothiazide (DIOVAN-HCT) 80-12.5 MG tablet Take 0.5 tablets by mouth daily. 11/30/19   [provider]    Physical Exam: Vitals:  10/05/20 1037  BP: 116/76  Pulse: (!) 57  Resp: 18  Temp: (!) 97.5 F (36.4 C)  TempSrc: Oral  SpO2: 100%    General: 70 y.o. male resting in bed in NAD Eyes: PERRL, normal sclera ENMT: Nares patent w/o discharge, orophaynx clear, dentition normal, ears w/o discharge/lesions/ulcers Neck: Supple, trachea midline Cardiovascular: RRR, +S1, S2, no m/g/r, equal pulses throughout Respiratory: CTABL, no w/r/r, normal WOB GI: BS+, NDNT, no masses noted, no organomegaly noted MSK: No e/c/c Skin: No rashes, bruises, ulcerations noted Neuro: A&O x 3, no focal deficits Psyc: Appropriate interaction and affect, calm/cooperative  Labs on Admission: I  have personally reviewed following labs and imaging studies  CBC: No results for input(s): WBC, NEUTROABS, HGB, HCT, MCV, PLT in the last 168 hours. Basic Metabolic Panel: No results for input(s): NA, K, CL, CO2, GLUCOSE, BUN, CREATININE, CALCIUM, MG, PHOS in the last 168 hours. GFR: CrCl cannot be calculated (No successful lab value found.). Liver Function Tests: No results for input(s): AST, ALT, ALKPHOS, BILITOT, PROT, ALBUMIN in the last 168 hours. No results for input(s): LIPASE, AMYLASE in the last 168 hours. No results for input(s): AMMONIA in the last 168 hours. Coagulation Profile: No results for input(s): INR, PROTIME in the last 168 hours. Cardiac Enzymes: No results for input(s): CKTOTAL, CKMB, CKMBINDEX, TROPONINI in the last 168 hours. BNP (last 3 results) No results for input(s): PROBNP in the last 8760 hours. HbA1C: No results for input(s): HGBA1C in the last 72 hours. CBG: No results for input(s): GLUCAP in the last 168 hours. Lipid Profile: No results for input(s): CHOL, HDL, LDLCALC, TRIG, CHOLHDL, LDLDIRECT in the last 72 hours. Thyroid Function Tests: No results for input(s): TSH, T4TOTAL, FREET4, T3FREE, THYROIDAB in the last 72 hours. Anemia Panel: No results for input(s): VITAMINB12, FOLATE, FERRITIN, TIBC, IRON, RETICCTPCT in the last 72 hours. Urine analysis: No results found for: COLORURINE, APPEARANCEUR, LABSPEC, PHURINE, GLUCOSEU, HGBUR, BILIRUBINUR, KETONESUR, PROTEINUR, UROBILINOGEN, NITRITE, LEUKOCYTESUR  Radiological Exams on Admission: No results found.  EKG: Independently reviewed. Sinus brady, no st elevations  Assessment/Plan Abdominal pain Elevated LFTs Elevated lipase     - place in obs     - labs pending     - ab exam is pretty benign     - LFTs in office: AST/ALT 142/220, T bil 1.9,  Alk phos: 361, lipase 1485 (1/31) -> 968 (yesterday)     - [place] ab from office: mild dilation of CBD (2m), no stone/mass seen     - he can have CLD for  now     - spoke with [facility] (Dr. KTherisa Doyne; concern for pancreatic mass, ordered MRCP w/ contrast  HTN     - BP looks good; resume home meds  BPH     - resume home meds  HLD     - hold statin for now, d/t elevated LFTs  Thrombocytopenia     - mild, no evidence of bleed, follow  DVT prophylaxis: SCDs  Code Status: FULL  Family Communication: None at bedside.   [facility] (Dr. K[facility]isa Doyne. Status is: Observation  The patient remains OBS appropriate and will d/c before 2 midnights.  Dispo: The patient is from: Home              Anticipated d/c is to: Home              Anticipated d/c date is: 1 day  Patient currently is medically stable to d/c.   Difficult to place patient No  Jonnie Finner DO [facility]  If 7PM-7AM, please contact night-coverage [email or web address]  10/05/2020, 10:39 AM

## 2020-10-05 NOTE — Anesthesia Preprocedure Evaluation (Addendum)
Anesthesia Evaluation  Patient identified by MRN, date of birth, ID band Patient awake    Reviewed: Allergy & Precautions, NPO status , Patient's Chart, lab work & pertinent test results  Airway Mallampati: II  TM Distance: >3 FB Neck ROM: Full    Dental no notable dental hx. (+) Teeth Intact, Dental Advisory Given   Pulmonary neg pulmonary ROS,    Pulmonary exam normal breath sounds clear to auscultation       Cardiovascular hypertension, Pt. on medications Normal cardiovascular exam Rhythm:Regular Rate:Normal  10/2016 Myoview  Nuclear stress EF: 57%.  No T wave inversion was noted during stress.  Upsloping ST segment depression ST segment depression of 1 mm was noted during stress in the II, aVF, III, V6, V5 and V4 leads.      Neuro/Psych negative neurological ROS  negative psych ROS   GI/Hepatic Neg liver ROS, GERD  ,Lab Results      Component                Value               Date                      ALT                      218 (H)             10/05/2020                AST                      152 (H)             10/05/2020                ALKPHOS                  331 (H)             10/05/2020                BILITOT                  1.2                 10/05/2020              Endo/Other  negative endocrine ROS  Renal/GU negative Renal ROSK+ 4.1 Cr 1.19     Musculoskeletal negative musculoskeletal ROS (+)   Abdominal   Peds  Hematology negative hematology ROS (+) Lab Results      Component                Value               Date                      WBC                      2.9 (L)             10/05/2020                HGB                      13.5                10/05/2020  HCT                      41.0                10/05/2020                MCV                      87.6                10/05/2020                PLT                      147 (L)             10/05/2020               Anesthesia Other Findings   Reproductive/Obstetrics                            Anesthesia Physical Anesthesia Plan  ASA: II  Anesthesia Plan: General   Post-op Pain Management:    Induction: Intravenous  PONV Risk Score and Plan: Treatment may vary due to age or medical condition  Airway Management Planned: Oral ETT  Additional Equipment: None  Intra-op Plan:   Post-operative Plan: Extubation in OR  Informed Consent: I have reviewed the patients History and Physical, chart, labs and discussed the procedure including the risks, benefits and alternatives for the proposed anesthesia with the patient or authorized representative who has indicated his/her understanding and acceptance.     Dental advisory given  Plan Discussed with: CRNA and Anesthesiologist  Anesthesia Plan Comments: (Elevated LFT now more ERCP )       Anesthesia Quick Evaluation

## 2020-10-06 ENCOUNTER — Encounter (HOSPITAL_COMMUNITY)
Admission: AD | Disposition: A | Discharge status: Home / Self Care | Payer: Self-pay | Source: Ambulatory Visit | Attending: Internal Medicine

## 2020-10-06 ENCOUNTER — Observation Stay (HOSPITAL_COMMUNITY): Payer: Medicare HMO | Admitting: Anesthesiology

## 2020-10-06 ENCOUNTER — Encounter (HOSPITAL_COMMUNITY): Payer: Self-pay | Admitting: Internal Medicine

## 2020-10-06 ENCOUNTER — Observation Stay (HOSPITAL_COMMUNITY): Payer: Medicare HMO

## 2020-10-06 DIAGNOSIS — N400 Enlarged prostate without lower urinary tract symptoms: Secondary | ICD-10-CM | POA: Diagnosis not present

## 2020-10-06 DIAGNOSIS — Z79899 Other long term (current) drug therapy: Secondary | ICD-10-CM | POA: Diagnosis not present

## 2020-10-06 DIAGNOSIS — R932 Abnormal findings on diagnostic imaging of liver and biliary tract: Secondary | ICD-10-CM | POA: Diagnosis not present

## 2020-10-06 DIAGNOSIS — R1013 Epigastric pain: Secondary | ICD-10-CM | POA: Diagnosis present

## 2020-10-06 DIAGNOSIS — R748 Abnormal levels of other serum enzymes: Secondary | ICD-10-CM

## 2020-10-06 DIAGNOSIS — R7989 Other specified abnormal findings of blood chemistry: Secondary | ICD-10-CM | POA: Diagnosis not present

## 2020-10-06 DIAGNOSIS — K838 Other specified diseases of biliary tract: Secondary | ICD-10-CM

## 2020-10-06 DIAGNOSIS — I10 Essential (primary) hypertension: Secondary | ICD-10-CM | POA: Diagnosis not present

## 2020-10-06 DIAGNOSIS — R7401 Elevation of levels of liver transaminase levels: Secondary | ICD-10-CM | POA: Diagnosis present

## 2020-10-06 DIAGNOSIS — Z7901 Long term (current) use of anticoagulants: Secondary | ICD-10-CM | POA: Diagnosis not present

## 2020-10-06 DIAGNOSIS — Z20822 Contact with and (suspected) exposure to covid-19: Secondary | ICD-10-CM | POA: Diagnosis not present

## 2020-10-06 DIAGNOSIS — E785 Hyperlipidemia, unspecified: Secondary | ICD-10-CM | POA: Diagnosis not present

## 2020-10-06 DIAGNOSIS — K219 Gastro-esophageal reflux disease without esophagitis: Secondary | ICD-10-CM | POA: Diagnosis not present

## 2020-10-06 DIAGNOSIS — R945 Abnormal results of liver function studies: Secondary | ICD-10-CM | POA: Diagnosis not present

## 2020-10-06 HISTORY — PX: ERCP: SHX5425

## 2020-10-06 HISTORY — PX: SPHINCTEROTOMY: SHX5544

## 2020-10-06 HISTORY — PX: BILIARY BRUSHING: SHX6843

## 2020-10-06 LAB — CBC
HCT: 38.1 % — ABNORMAL LOW (ref 39.0–52.0)
Hemoglobin: 12.5 g/dL — ABNORMAL LOW (ref 13.0–17.0)
MCH: 28.9 pg (ref 26.0–34.0)
MCHC: 32.8 g/dL (ref 30.0–36.0)
MCV: 88 fL (ref 80.0–100.0)
Platelets: 151 10*3/uL (ref 150–400)
RBC: 4.33 MIL/uL (ref 4.22–5.81)
RDW: 13.6 % (ref 11.5–15.5)
WBC: 4 10*3/uL (ref 4.0–10.5)
nRBC: 0 % (ref 0.0–0.2)

## 2020-10-06 LAB — COMPREHENSIVE METABOLIC PANEL
ALT: 216 U/L — ABNORMAL HIGH (ref 0–44)
AST: 170 U/L — ABNORMAL HIGH (ref 15–41)
Albumin: 3.5 g/dL (ref 3.5–5.0)
Alkaline Phosphatase: 343 U/L — ABNORMAL HIGH (ref 38–126)
Anion gap: 9 (ref 5–15)
BUN: 10 mg/dL (ref 8–23)
CO2: 25 mmol/L (ref 22–32)
Calcium: 8.6 mg/dL — ABNORMAL LOW (ref 8.9–10.3)
Chloride: 107 mmol/L (ref 98–111)
Creatinine, Ser: 1.16 mg/dL (ref 0.61–1.24)
GFR, Estimated: >60 mL/min (ref >60)
Glucose, Bld: 107 mg/dL — ABNORMAL HIGH (ref 70–99)
Potassium: 4.2 mmol/L (ref 3.5–5.1)
Sodium: 141 mmol/L (ref 135–145)
Total Bilirubin: 1.2 mg/dL (ref 0.3–1.2)
Total Protein: 6.2 g/dL — ABNORMAL LOW (ref 6.5–8.1)

## 2020-10-06 LAB — IRON AND TIBC
Iron: 48 ug/dL (ref 45–182)
Saturation Ratios: 12 % — ABNORMAL LOW (ref 17.9–39.5)
TIBC: 411 ug/dL (ref 250–450)
UIBC: 363 ug/dL

## 2020-10-06 LAB — FERRITIN: Ferritin: 25 ng/mL (ref 24–336)

## 2020-10-06 LAB — HEPATITIS B SURFACE ANTIGEN: Hepatitis B Surface Ag: NONREACTIVE

## 2020-10-06 SURGERY — ERCP, WITH INTERVENTION IF INDICATED
Anesthesia: General

## 2020-10-06 MED ORDER — PROPOFOL 10 MG/ML IV BOLUS
INTRAVENOUS | Status: DC | PRN
  Administered 2020-10-06: 180 mg via INTRAVENOUS

## 2020-10-06 MED ORDER — TAMSULOSIN HCL 0.4 MG PO CAPS: 0.4000 mg | ORAL_CAPSULE | Freq: Every day | ORAL | Status: DC

## 2020-10-06 MED ORDER — CIPROFLOXACIN IN D5W 400 MG/200ML IV SOLN
INTRAVENOUS | Status: DC | PRN
  Administered 2020-10-06: 400 mg via INTRAVENOUS

## 2020-10-06 MED ORDER — LIDOCAINE HCL (CARDIAC) PF 100 MG/5ML IV SOSY
PREFILLED_SYRINGE | INTRAVENOUS | Status: DC | PRN
  Administered 2020-10-06: 100 mg via INTRAVENOUS

## 2020-10-06 MED ORDER — FENTANYL CITRATE (PF) 100 MCG/2ML IJ SOLN
INTRAMUSCULAR | Status: DC | PRN
  Administered 2020-10-06: 100 ug via INTRAVENOUS

## 2020-10-06 MED ORDER — CIPROFLOXACIN IN D5W 400 MG/200ML IV SOLN
INTRAVENOUS | Status: AC
  Filled 2020-10-06: qty 200

## 2020-10-06 MED ORDER — AMLODIPINE BESYLATE 5 MG PO TABS: 5.0000 mg | ORAL_TABLET | Freq: Every day | ORAL | 2 refills | Status: DC

## 2020-10-06 MED ORDER — IRBESARTAN 75 MG PO TABS: 37.5000 mg | ORAL_TABLET | Freq: Every day | ORAL | Status: DC

## 2020-10-06 MED ORDER — SODIUM CHLORIDE 0.9 % IV SOLN: INTRAVENOUS | Status: DC

## 2020-10-06 MED ORDER — SODIUM CHLORIDE 0.9 % IV SOLN
INTRAVENOUS | Status: DC | PRN
  Administered 2020-10-06: 30 mL

## 2020-10-06 MED ORDER — ONDANSETRON HCL 4 MG/2ML IJ SOLN
INTRAMUSCULAR | Status: DC | PRN
  Administered 2020-10-06: 4 mg via INTRAVENOUS

## 2020-10-06 MED ORDER — SUGAMMADEX SODIUM 200 MG/2ML IV SOLN
INTRAVENOUS | Status: DC | PRN
  Administered 2020-10-06: 200 mg via INTRAVENOUS

## 2020-10-06 MED ORDER — GLUCAGON HCL RDNA (DIAGNOSTIC) 1 MG IJ SOLR
INTRAMUSCULAR | Status: AC
  Filled 2020-10-06: qty 1

## 2020-10-06 MED ORDER — INDOMETHACIN 50 MG RE SUPP
RECTAL | Status: AC
  Filled 2020-10-06: qty 2

## 2020-10-06 MED ORDER — DEXAMETHASONE SODIUM PHOSPHATE 10 MG/ML IJ SOLN
INTRAMUSCULAR | Status: DC | PRN
  Administered 2020-10-06: 5 mg via INTRAVENOUS

## 2020-10-06 MED ORDER — FENTANYL CITRATE (PF) 100 MCG/2ML IJ SOLN
INTRAMUSCULAR | Status: AC
  Filled 2020-10-06: qty 2

## 2020-10-06 MED ORDER — AMLODIPINE BESYLATE 5 MG PO TABS: 5.0000 mg | ORAL_TABLET | Freq: Every day | ORAL | Status: DC

## 2020-10-06 MED ORDER — ROCURONIUM BROMIDE 10 MG/ML (PF) SYRINGE
PREFILLED_SYRINGE | INTRAVENOUS | Status: DC | PRN
  Administered 2020-10-06: 50 mg via INTRAVENOUS

## 2020-10-06 NOTE — Progress Notes (Signed)
D/C instructions given to patient. Patient had no questions. NT or writer will wheel patient out once his ride is here

## 2020-10-06 NOTE — Discharge Summary (Signed)
Physician Discharge Summary  David Jarvis PXT:[id number] DOB: 20-Sep-1950 DOA: 10/05/2020  PCP: Wenda Low, MD  Admit date: 10/05/2020 Discharge date: 10/06/2020  Time spent: 50 minutes  Recommendations for Outpatient Follow-up:  1. Follow-up with Dr. Therisa Doyne, gastroenterology in 2 weeks.  On follow-up labs obtained for evaluation of transaminitis will need to be followed up upon as well as brushings and cytology from ERCP.  Comprehensive metabolic profile need to be obtained for follow-up.  Resumption of statin will need to be determined on follow-up. 2. Follow-up with Wenda Low, MD in 2 weeks.  On follow-up patient's blood pressure need to be reassessed as patient's Diovan HCT was discontinued due to concerns may be contributing to transaminitis and elevated lipase levels.  Patient started on Norvasc 5 mg daily.  Resumption of statin will also need to be followed up upon   Discharge Diagnoses:  Principal Problem:   Epigastric abdominal pain Active Problems:   Transaminitis   Elevated lipase   Common bile duct dilatation   Abdominal pain   Essential hypertension   Hyperlipidemia   BPH (benign prostatic hyperplasia)   Discharge Condition: Stable and improved  Diet recommendation: Heart healthy  Filed Weights   10/06/20 0911  Weight: 75.8 kg    History of present illness:  HPI per Dr. Stark Bray is a 70 y.o. male with medical history significant of HTN, HLD, GERD. Presented with epigastric abdominal pain. Reported symptoms began 1 week ago. Dull epigastric pain that was worsened with eating and deep breathing. He had also had fatigue during this time. He has tried avoiding heavy foods to help his symptoms. He was evaluated by his PCP who tried to get imaging arrange, but was unable to for at least the next week. They spoke with GI who recommended hospitalization for work up. He was called back today and told to come to the hospital due to abnormal lipase. [facility] was called  for admission.     Review of Systems:  Denied CP, palpitations, dyspnea, N/V/D/F, hematochezia, hematemesis. Review of systems is otherwise negative for all not mentioned in HPI.   Hospital Course:  1 transaminitis/elevated lipase/dilated common bile duct/epigastric abdominal pain Patient had presented with epigastric abdominal pain that had worsened with deep inspiration and with oral intake.  Patient also had some symptoms of fatigue.  Patient noted to have had a transaminitis at PCPs office with AST/ALT 142/220, total bilirubin 1.9, alk phosphatase 361, lipase 1485(10/02/2020)>> 968 on presentation to ED. Abdominal ultrasound done at PCPs office with mild dilatation of common bile duct with no stone or mass seen.  Patient was admitted for further work-up.  Patient placed on clears. GI was consulted and concerns for pancreatic mass and as such MRCP with contrast was ordered.  MRCP with intra and extrahepatic biliary ductal dilatation, common bile duct measuring 1.6 cm in caliber which appeared to taper smoothly to the ampulla, no discrete mass or filling defect seen.  No direct evidence of mass or suspicious contrast-enhancement. Patient subsequently underwent an ERCP which showed an entire main bile duct was markedly dilated, biliary sphincterectomy was performed, biliary tree was swept and nothing was found, cells for cytology obtained in the lower third of the main duct. Labs were ordered per GI on day of discharge of alpha 1 antitrypsin level, mitochondrial antibodies, anti-smooth muscle antibody, hepatitis C antibody, hepatitis B surface antigen which were pending by day of discharge. Patient also noted to be on Diovan HCT and due to concerns that  this may be leading to transaminitis and elevated lipase levels was discontinued during the hospitalization and on discharge. Patient was placed on a regular diet which he tolerated with no significant epigastric or abdominal pain.  Patient was cleared by  GI for discharge with outpatient follow-up with GI.  2.  Hypertension Patient's Diovan HCT was held during the hospitalization secondary to problem #1.  Patient started on Norvasc 5 mg daily for blood pressure control.  Outpatient follow-up with PCP.  3.  Hyperlipidemia Patient statin discontinued secondary to problem #1.  Will defer resumption of statin to GI and PCP.  4.  BPH Home regimen Flomax resumed.  The rest of patient's chronic medical issues remained stable throughout the hospitalization.  Procedures:  MRCP 10/05/2020  ERCP 10/06/2020  Consultations:  Gastroenterology: Dr. Therisa Doyne 10/05/2020  Discharge Exam: Vitals:   10/06/20 1142 10/06/20 1336  BP: (!) 142/76 (!) 143/86  Pulse: (!) 44 (!) 59  Resp: 18   Temp:  (!) 97.5 F (36.4 C)  SpO2: 99% 98%    General: NAD Cardiovascular: RRR Respiratory: CTAB  Discharge Instructions   Discharge Instructions    Diet - low sodium heart healthy   Complete by: As directed    Increase activity slowly   Complete by: As directed      Allergies as of 10/06/2020      Reactions   Lisinopril Cough      Medication List    STOP taking these medications   atorvastatin 10 MG tablet Commonly known as: LIPITOR   valsartan-hydrochlorothiazide 80-12.5 MG tablet Commonly known as: DIOVAN-HCT     TAKE these medications   amLODipine 5 MG tablet Commonly known as: NORVASC Take 1 tablet (5 mg total) by mouth daily.   aspirin EC 81 MG tablet Take 81 mg by mouth daily.   pantoprazole 40 MG tablet Commonly known as: PROTONIX Take 40 mg by mouth daily.   sildenafil 20 MG tablet Commonly known as: REVATIO Take 20 mg by mouth daily as needed (erectile dysfunction).   tamsulosin 0.4 MG Caps capsule Commonly known as: FLOMAX Take 0.4 mg by mouth daily.      Allergies  Allergen Reactions  . Lisinopril Cough    Follow-up Information    Wenda Low, MD. Schedule an appointment as soon as possible for a visit in 2  week(s).   Specialty: Internal Medicine Contact information: [address] [place] [address] [phone]        Ronnette Juniper, MD. Schedule an appointment as soon as possible for a visit in 2 week(s).   Specialty: Gastroenterology Contact information: [place] [address] [phone]                The results of significant diagnostics from this hospitalization (including imaging, microbiology, ancillary and laboratory) are listed below for reference.    Significant Diagnostic Studies: MR 3D Recon At Scanner  Result Date: 10/05/2020 CLINICAL DATA:  Right upper quadrant pain, abnormal ultrasound with mild common biliary ductal dilatation, rule out mass or other obstruction EXAM: MRI ABDOMEN WITHOUT AND WITH CONTRAST (INCLUDING MRCP) TECHNIQUE: Multiplanar multisequence MR imaging of the abdomen was performed both before and after the administration of intravenous contrast. Heavily T2-weighted images of the biliary and pancreatic ducts were obtained, and three-dimensional MRCP images were rendered by post processing. CONTRAST:  18mL GADAVIST GADOBUTROL 1 MMOL/ML IV SOLN COMPARISON:  Right upper quadrant ultrasound, 09/29/2020 FINDINGS: Lower chest: No acute findings. Hepatobiliary:  No mass or other parenchymal abnormality identified. There is intra and extrahepatic biliary ductal dilatation, the common bile duct measuring up to 1.6 cm in caliber, although appearing to taper smoothly to the ampulla (series 12, image 18). Pancreas: No mass, inflammatory changes, or other parenchymal abnormality identified. No pancreatic ductal dilatation. Spleen:  Within normal limits in size and appearance. Adrenals/Urinary Tract: No masses identified. No evidence of hydronephrosis. Stomach/Bowel: Visualized portions within the abdomen are unremarkable. Vascular/Lymphatic: No pathologically enlarged lymph nodes identified. No abdominal aortic aneurysm  demonstrated. Other:  None. Musculoskeletal: No suspicious bone lesions identified. IMPRESSION: 1. There is intra and extrahepatic biliary ductal dilatation, the common bile duct measuring up to 1.6 cm in caliber, although appearing to taper smoothly to the ampulla. No discrete mass or filling defect is seen at this time. Consider ERCP to further evaluate. 2. No direct evidence of mass or suspicious contrast enhancement. Electronically Signed   By: Eddie Candle M.D.   On: 10/05/2020 13:54   DG ERCP  Result Date: 10/06/2020 CLINICAL DATA:  Elevated LFTs.  Dilated CBD. EXAM: ERCP TECHNIQUE: Multiple spot images obtained with the fluoroscopic device and submitted for interpretation post-procedure. COMPARISON:  MRCP-10/05/2020 FLUOROSCOPY TIME:  1 minute, 33 seconds (24 mGy) FINDINGS: Eight spot intraoperative fluoroscopic images during ERCP are provided for review Initial image demonstrates an ERCP probe overlying the right upper abdominal quadrant. Subsequent images demonstrate selective cannulation opacification of the common bile duct which appears at least moderately dilated. Subsequent images demonstrate insufflation of a balloon within the central aspect of the CBD with subsequent presumed biliary sweeping and sphincterotomy. There is minimal opacification the intrahepatic biliary tree which appears nondilated. There is no definitive opacification of either the cystic or pancreatic ducts. IMPRESSION: ERCP with biliary sweeping and presumed sphincterotomy as above. These images were submitted for radiologic interpretation only. Please see the procedural report for the amount of contrast and the fluoroscopy time utilized. Electronically Signed   By: Sandi Mariscal M.D.   On: 10/06/2020 12:27   MR ABDOMEN MRCP W WO CONTAST  Result Date: 10/05/2020 CLINICAL DATA:  Right upper quadrant pain, abnormal ultrasound with mild common biliary ductal dilatation, rule out mass or other obstruction EXAM: MRI ABDOMEN WITHOUT  AND WITH CONTRAST (INCLUDING MRCP) TECHNIQUE: Multiplanar multisequence MR imaging of the abdomen was performed both before and after the administration of intravenous contrast. Heavily T2-weighted images of the biliary and pancreatic ducts were obtained, and three-dimensional MRCP images were rendered by post processing. CONTRAST:  34mL GADAVIST GADOBUTROL 1 MMOL/ML IV SOLN COMPARISON:  Right upper quadrant ultrasound, 09/29/2020 FINDINGS: Lower chest: No acute findings. Hepatobiliary: No mass or other parenchymal abnormality identified. There is intra and extrahepatic biliary ductal dilatation, the common bile duct measuring up to 1.6 cm in caliber, although appearing to taper smoothly to the ampulla (series 12, image 18). Pancreas: No mass, inflammatory changes, or other parenchymal abnormality identified. No pancreatic ductal dilatation. Spleen:  Within normal limits in size and appearance. Adrenals/Urinary Tract: No masses identified. No evidence of hydronephrosis. Stomach/Bowel: Visualized portions within the abdomen are unremarkable. Vascular/Lymphatic: No pathologically enlarged lymph nodes identified. No abdominal aortic aneurysm demonstrated. Other:  None. Musculoskeletal: No suspicious bone lesions identified. IMPRESSION: 1. There is intra and extrahepatic biliary ductal dilatation, the common bile duct measuring up to 1.6 cm in caliber, although appearing to taper smoothly to the ampulla. No discrete mass or filling defect is seen at this time. Consider ERCP to further evaluate. 2. No direct evidence of mass  or suspicious contrast enhancement. Electronically Signed   By: Eddie Candle M.D.   On: 10/05/2020 13:54    Microbiology: Recent Results (from the past 240 hour(s))  SARS CORONAVIRUS 2 (TAT 6-24 HRS) Nasopharyngeal Nasopharyngeal Swab     Status: None   Collection Time: 10/05/20 10:55 AM   Specimen: Nasopharyngeal Swab  Result Value Ref Range Status   SARS Coronavirus 2 NEGATIVE NEGATIVE  Final    Comment: (NOTE) SARS-CoV-2 target nucleic acids are NOT DETECTED.  The SARS-CoV-2 RNA is generally detectable in upper and lower respiratory specimens during the acute phase of infection. Negative results do not preclude SARS-CoV-2 infection, do not rule out co-infections with other pathogens, and should not be used as the sole basis for treatment or other patient management decisions. Negative results must be combined with clinical observations, patient history, and epidemiological information. The expected result is Negative.  Fact Sheet for Patients: SugarRoll.be  Fact Sheet for Healthcare Providers: https://www.woods-mathews.com/  This test is not yet approved or cleared by the [place] FDA and  has been authorized for detection and/or diagnosis of SARS-CoV-2 by FDA under an Emergency Use Authorization (EUA). This EUA will remain  in effect (meaning this test can be used) for the duration of the COVID-19 declaration under Se ction 564(b)(1) of the Act, 21 U.S.C. section 360bbb-3(b)(1), unless the authorization is terminated or revoked sooner.  Performed at [facility], [place] [address], [place], [place] [address]      Labs: Basic Metabolic Panel: Recent Labs  Lab 10/05/20 1145 10/06/20 0452  NA 139 141  K 4.1 4.2  CL 106 107  CO2 24 25  GLUCOSE 111* 107*  BUN 15 10  CREATININE 1.19 1.16  CALCIUM 8.8* 8.6*   Liver Function Tests: Recent Labs  Lab 10/05/20 1145 10/06/20 0452  AST 152* 170*  ALT 218* 216*  ALKPHOS 331* 343*  BILITOT 1.2 1.2  PROT 6.8 6.2*  ALBUMIN 3.9 3.5   Recent Labs  Lab 10/05/20 1145  LIPASE 280*   No results for input(s): AMMONIA in the last 168 hours. CBC: Recent Labs  Lab 10/05/20 1145 10/06/20 0452  WBC 2.9* 4.0  NEUTROABS 1.3*  --   HGB 13.5 12.5*  HCT 41.0 38.1*  MCV 87.6 88.0  PLT 147* 151   Cardiac Enzymes: No results for input(s): CKTOTAL, CKMB,  CKMBINDEX, TROPONINI in the last 168 hours. BNP: BNP (last 3 results) No results for input(s): BNP in the last 8760 hours.  ProBNP (last 3 results) No results for input(s): PROBNP in the last 8760 hours.  CBG: No results for input(s): GLUCAP in the last 168 hours.     Signed:  Irine Seal MD.  [facility] 10/06/2020, 5:35 PM

## 2020-10-06 NOTE — Anesthesia Procedure Notes (Signed)
Procedure Name: Intubation Date/Time: 10/06/2020 10:12 AM Performed by: Raenette Rover, CRNA Pre-anesthesia Checklist: Patient identified, Emergency Drugs available, Suction available and Patient being monitored Patient Re-evaluated:Patient Re-evaluated prior to induction Oxygen Delivery Method: Circle system utilized Preoxygenation: Pre-oxygenation with 100% oxygen Induction Type: IV induction Ventilation: Mask ventilation without difficulty Laryngoscope Size: Mac and 4 Grade View: Grade I Tube type: Oral Tube size: 7.5 mm Number of attempts: 1 Airway Equipment and Method: Stylet Placement Confirmation: ETT inserted through vocal cords under direct vision,  positive ETCO2 and breath sounds checked- equal and bilateral Secured at: 22 cm Tube secured with: Tape Dental Injury: Teeth and Oropharynx as per pre-operative assessment

## 2020-10-06 NOTE — Transfer of Care (Signed)
Immediate Anesthesia Transfer of Care Note  Patient: David Jarvis  Procedure(s) Performed: ENDOSCOPIC RETROGRADE CHOLANGIOPANCREATOGRAPHY (ERCP) (N/A )  Patient Location: Endoscopy Unit  Anesthesia Type:General  Level of Consciousness: awake, alert , oriented and patient cooperative  Airway & Oxygen Therapy: Patient Spontanous Breathing and Patient connected to face mask oxygen  Post-op Assessment: Report given to RN and Post -op Vital signs reviewed and stable  Post vital signs: Reviewed and stable  Last Vitals:  Vitals Value Taken Time  BP 181/82 1056  Temp    Pulse 54 10/06/20 1058  Resp 12 10/06/20 1058  SpO2 100 % 10/06/20 1058  Vitals shown include unvalidated device data.  Last Pain:  Vitals:   10/06/20 0911  TempSrc: Oral  PainSc: 0-No pain         Complications: No complications documented.

## 2020-10-06 NOTE — Op Note (Signed)
[facility] Patient Name: David Jarvis Procedure Date: 10/06/2020 MRN: [id number] Attending MD: Ronnette Juniper , MD Date of Birth: May 14, 1951 CSN: [id number] Age: 70 Admit Type: Inpatient Procedure:                ERCP Indications:              Abnormal MRCP, Biliary dilation on Ultrasound and                            MRCP, Elevated liver enzymes, elevated lipase but                            normal appearing pancreas and pancreatic duct Providers:                Ronnette Juniper, MD, Cleda Daub, RN, Elspeth Cho                            Tech., Technician Referring MD:             Wenda Low, MD Medicines:                Monitored Anesthesia Care Complications:            No immediate complications. Estimated blood loss:                            Minimal. Estimated Blood Loss:     Estimated blood loss was minimal. Procedure:                Pre-Anesthesia Assessment:                           - Prior to the procedure, a History and Physical                            was performed, and patient medications and                            allergies were reviewed. The patient's tolerance of                            previous anesthesia was also reviewed. The risks                            and benefits of the procedure and the sedation                            options and risks were discussed with the patient.                            All questions were answered, and informed consent                            was obtained. Prior Anticoagulants: The patient has  taken no previous anticoagulant or antiplatelet                            agents. ASA Grade Assessment: II - A patient with                            mild systemic disease. After reviewing the risks                            and benefits, the patient was deemed in                            satisfactory condition to undergo the procedure.                           After  obtaining informed consent, the scope was                            passed under direct vision. Throughout the                            procedure, the patient's blood pressure, pulse, and                            oxygen saturations were monitored continuously. The                            ERCP [id number] was introduced through the mouth, and                            used to inject contrast into and used to inject                            contrast into the bile duct. The ERCP was                            accomplished without difficulty. The patient                            tolerated the procedure well. Scope In: Scope Out: Findings:      The scout film was normal. The esophagus was successfully intubated       under direct vision. The scope was advanced to a normal major papilla in       the descending duodenum without detailed examination of the pharynx,       larynx and associated structures, and upper GI tract. The upper GI tract       was grossly normal.      The ampullary area appeared unremarkable.      The bile duct was deeply cannulated readily on the first attempt with       the sphincterotome. Contrast was injected. The main bile duct was       markedly dilated, uncertain etiology.      The largest diameter was 15 mm. A straight Roadrunner wire was passed  into the biliary tree.      There was no stricture or filling defect noted.      A 10 mm biliary sphincterotomy was made with a braided sphincterotome       using ERBE electrocautery. The sphincterotomy oozed minimal amount of       blood, self limited.      The biliary tree was swept with a 15 mm balloon starting at the       bifurcation. The balloon size had to be decreased to 12 mm when being       withdrawn through the ampullary opening without resistance.Nothing was       found.      Cells for cytology were obtained by brushing in the lower third of the       main bile duct.      The cystic duct and  the gallbladder did not fill with injection of dye       in the CBD.      The pancreatic duct was intentionally not canulated or injected during       the procedure. Impression:               - The entire main bile duct was markedly dilated,                            uncertain etiology.                           - A biliary sphincterotomy was performed.                           - The biliary tree was swept and nothing was found.                           - Cells for cytology obtained in the lower third of                            the main duct. Moderate Sedation:      Patient did not receive moderate sedation for this procedure, but       instead received monitored anesthesia care. Recommendation:           - Advance diet as tolerated.                           - If patient remains without pain in the next few                            hours, Ok to D/C home.                           - Will order labs for elevated LFTs to evalute for                            chronic liver disease. Procedure Code(s):        --- Professional ---                           [phone], Endoscopic retrograde  cholangiopancreatography (ERCP); with                            sphincterotomy/papillotomy Diagnosis Code(s):        --- Professional ---                           K83.8, Other specified diseases of biliary tract                           R74.8, Abnormal levels of other serum enzymes                           R93.2, Abnormal findings on diagnostic imaging of                            liver and biliary tract CPT copyright 2019 American Medical Association. All rights reserved. The codes documented in this report are preliminary and upon coder review may  be revised to meet current compliance requirements. Ronnette Juniper, MD 10/06/2020 11:05:31 AM This report has been signed electronically. Number of Addenda: 0

## 2020-10-06 NOTE — Brief Op Note (Signed)
10/05/2020 - 10/06/2020  11:05 AM  PATIENT:  David Jarvis  70 y.o. male  PRE-OPERATIVE DIAGNOSIS:  abnormal MRCP, dilated CBD, abnormal LFTs  POST-OPERATIVE DIAGNOSIS:  * No post-op diagnosis entered *  PROCEDURE:  Procedure(s): ENDOSCOPIC RETROGRADE CHOLANGIOPANCREATOGRAPHY (ERCP) (N/A)  SURGEON:  Surgeon(s) and Role:    Ronnette Juniper, MD - Primary  PHYSICIAN ASSISTANT:   ASSISTANTS: Lilli Few, Waynette Buttery  ANESTHESIA:   MAC  EBL:  Minimal  BLOOD ADMINISTERED:none  DRAINS: none   LOCAL MEDICATIONS USED:  NONE  SPECIMEN:  Source of Specimen:  cytology from lower CBD  DISPOSITION OF SPECIMEN:  PATHOLOGY  COUNTS:  YES  TOURNIQUET:  * No tourniquets in log *  DICTATION: .Dragon Dictation  PLAN OF CARE: Admit to inpatient   PATIENT DISPOSITION:  PACU - hemodynamically stable.   Delay start of Pharmacological VTE agent (>24hrs) due to surgical blood loss or risk of bleeding: not applicable

## 2020-10-06 NOTE — Anesthesia Postprocedure Evaluation (Signed)
Anesthesia Post Note  Patient: David Jarvis  Procedure(s) Performed: ENDOSCOPIC RETROGRADE CHOLANGIOPANCREATOGRAPHY (ERCP) (N/A ) SPHINCTEROTOMY BILIARY BRUSHING     Patient location during evaluation: Endoscopy Anesthesia Type: General Level of consciousness: awake and alert Pain management: pain level controlled Vital Signs Assessment: post-procedure vital signs reviewed and stable Respiratory status: spontaneous breathing, nonlabored ventilation, respiratory function stable and patient connected to nasal cannula oxygen Cardiovascular status: blood pressure returned to baseline and stable Postop Assessment: no apparent nausea or vomiting Anesthetic complications: no   No complications documented.  Last Vitals:  Vitals:   10/06/20 1125 10/06/20 1142  BP: (!) 162/82 (!) 142/76  Pulse: (!) 52 (!) 44  Resp: 12 18  Temp:    SpO2: 98% 99%    Last Pain:  Vitals:   10/06/20 1142  TempSrc:   PainSc: 0-No pain                 Barnet Glasgow

## 2020-10-07 LAB — CERULOPLASMIN: Ceruloplasmin: 33.6 mg/dL — ABNORMAL HIGH (ref 16.0–31.0)

## 2020-10-07 LAB — ALPHA-1-ANTITRYPSIN: A-1 Antitrypsin, Ser: 167 mg/dL (ref 101–187)

## 2020-10-07 LAB — HCV INTERPRETATION

## 2020-10-07 LAB — HCV AB W REFLEX TO QUANT PCR: HCV Ab: <0.1 s/co ratio (ref 0.0–0.9)

## 2020-10-07 LAB — ANTI-SMOOTH MUSCLE ANTIBODY, IGG: F-Actin IgG: 3 Units (ref 0–19)

## 2020-10-07 LAB — MITOCHONDRIAL ANTIBODIES: Mitochondrial M2 Ab, IgG: <20 Units (ref 0.0–20.0)

## 2020-10-09 LAB — CYTOLOGY - NON PAP

## 2020-10-12 DIAGNOSIS — R1013 Epigastric pain: Secondary | ICD-10-CM | POA: Diagnosis not present

## 2020-10-12 DIAGNOSIS — R7989 Other specified abnormal findings of blood chemistry: Secondary | ICD-10-CM | POA: Diagnosis not present

## 2020-10-12 DIAGNOSIS — R748 Abnormal levels of other serum enzymes: Secondary | ICD-10-CM | POA: Diagnosis not present

## 2020-10-12 DIAGNOSIS — E782 Mixed hyperlipidemia: Secondary | ICD-10-CM | POA: Diagnosis not present

## 2020-10-12 DIAGNOSIS — I10 Essential (primary) hypertension: Secondary | ICD-10-CM | POA: Diagnosis not present

## 2020-10-21 ENCOUNTER — Other Ambulatory Visit: Payer: Medicare HMO

## 2020-10-27 DIAGNOSIS — I10 Essential (primary) hypertension: Secondary | ICD-10-CM | POA: Diagnosis not present

## 2020-10-27 DIAGNOSIS — E782 Mixed hyperlipidemia: Secondary | ICD-10-CM | POA: Diagnosis not present

## 2020-10-27 DIAGNOSIS — K219 Gastro-esophageal reflux disease without esophagitis: Secondary | ICD-10-CM | POA: Diagnosis not present

## 2020-10-27 DIAGNOSIS — N400 Enlarged prostate without lower urinary tract symptoms: Secondary | ICD-10-CM | POA: Diagnosis not present

## 2020-10-27 DIAGNOSIS — C61 Malignant neoplasm of prostate: Secondary | ICD-10-CM | POA: Diagnosis not present

## 2020-11-02 DIAGNOSIS — Z1211 Encounter for screening for malignant neoplasm of colon: Secondary | ICD-10-CM | POA: Diagnosis not present

## 2020-11-02 DIAGNOSIS — R7989 Other specified abnormal findings of blood chemistry: Secondary | ICD-10-CM | POA: Diagnosis not present

## 2020-11-02 DIAGNOSIS — R945 Abnormal results of liver function studies: Secondary | ICD-10-CM | POA: Diagnosis not present

## 2020-11-02 DIAGNOSIS — R748 Abnormal levels of other serum enzymes: Secondary | ICD-10-CM | POA: Diagnosis not present

## 2020-11-02 DIAGNOSIS — Z9889 Other specified postprocedural states: Secondary | ICD-10-CM | POA: Diagnosis not present

## 2020-11-13 DIAGNOSIS — E782 Mixed hyperlipidemia: Secondary | ICD-10-CM | POA: Diagnosis not present

## 2020-11-13 DIAGNOSIS — I10 Essential (primary) hypertension: Secondary | ICD-10-CM | POA: Diagnosis not present

## 2020-11-13 DIAGNOSIS — N400 Enlarged prostate without lower urinary tract symptoms: Secondary | ICD-10-CM | POA: Diagnosis not present

## 2020-11-13 DIAGNOSIS — K219 Gastro-esophageal reflux disease without esophagitis: Secondary | ICD-10-CM | POA: Diagnosis not present

## 2020-11-13 DIAGNOSIS — C61 Malignant neoplasm of prostate: Secondary | ICD-10-CM | POA: Diagnosis not present

## 2020-12-04 DIAGNOSIS — R748 Abnormal levels of other serum enzymes: Secondary | ICD-10-CM | POA: Diagnosis not present

## 2020-12-26 DIAGNOSIS — Z0000 Encounter for general adult medical examination without abnormal findings: Secondary | ICD-10-CM | POA: Diagnosis not present

## 2020-12-26 DIAGNOSIS — N400 Enlarged prostate without lower urinary tract symptoms: Secondary | ICD-10-CM | POA: Diagnosis not present

## 2020-12-26 DIAGNOSIS — Z1389 Encounter for screening for other disorder: Secondary | ICD-10-CM | POA: Diagnosis not present

## 2020-12-26 DIAGNOSIS — E782 Mixed hyperlipidemia: Secondary | ICD-10-CM | POA: Diagnosis not present

## 2020-12-26 DIAGNOSIS — R7303 Prediabetes: Secondary | ICD-10-CM | POA: Diagnosis not present

## 2020-12-26 DIAGNOSIS — R748 Abnormal levels of other serum enzymes: Secondary | ICD-10-CM | POA: Diagnosis not present

## 2020-12-26 DIAGNOSIS — K219 Gastro-esophageal reflux disease without esophagitis: Secondary | ICD-10-CM | POA: Diagnosis not present

## 2020-12-26 DIAGNOSIS — I10 Essential (primary) hypertension: Secondary | ICD-10-CM | POA: Diagnosis not present

## 2020-12-26 DIAGNOSIS — C61 Malignant neoplasm of prostate: Secondary | ICD-10-CM | POA: Diagnosis not present

## 2021-01-24 DIAGNOSIS — K5730 Diverticulosis of large intestine without perforation or abscess without bleeding: Secondary | ICD-10-CM | POA: Diagnosis not present

## 2021-01-24 DIAGNOSIS — K621 Rectal polyp: Secondary | ICD-10-CM | POA: Diagnosis not present

## 2021-01-24 DIAGNOSIS — Z1211 Encounter for screening for malignant neoplasm of colon: Secondary | ICD-10-CM | POA: Diagnosis not present

## 2021-01-24 DIAGNOSIS — K635 Polyp of colon: Secondary | ICD-10-CM | POA: Diagnosis not present

## 2021-01-26 DIAGNOSIS — K621 Rectal polyp: Secondary | ICD-10-CM | POA: Diagnosis not present

## 2021-01-26 DIAGNOSIS — K635 Polyp of colon: Secondary | ICD-10-CM | POA: Diagnosis not present

## 2021-02-09 DIAGNOSIS — C61 Malignant neoplasm of prostate: Secondary | ICD-10-CM | POA: Diagnosis not present

## 2021-02-09 DIAGNOSIS — E039 Hypothyroidism, unspecified: Secondary | ICD-10-CM | POA: Diagnosis not present

## 2021-02-09 DIAGNOSIS — I10 Essential (primary) hypertension: Secondary | ICD-10-CM | POA: Diagnosis not present

## 2021-02-09 DIAGNOSIS — K219 Gastro-esophageal reflux disease without esophagitis: Secondary | ICD-10-CM | POA: Diagnosis not present

## 2021-02-09 DIAGNOSIS — E782 Mixed hyperlipidemia: Secondary | ICD-10-CM | POA: Diagnosis not present

## 2021-02-09 DIAGNOSIS — E038 Other specified hypothyroidism: Secondary | ICD-10-CM | POA: Diagnosis not present

## 2021-02-09 DIAGNOSIS — N400 Enlarged prostate without lower urinary tract symptoms: Secondary | ICD-10-CM | POA: Diagnosis not present

## 2021-02-28 DIAGNOSIS — E782 Mixed hyperlipidemia: Secondary | ICD-10-CM | POA: Diagnosis not present

## 2021-02-28 DIAGNOSIS — R946 Abnormal results of thyroid function studies: Secondary | ICD-10-CM | POA: Diagnosis not present

## 2021-03-08 DIAGNOSIS — E038 Other specified hypothyroidism: Secondary | ICD-10-CM | POA: Diagnosis not present

## 2021-03-08 DIAGNOSIS — I10 Essential (primary) hypertension: Secondary | ICD-10-CM | POA: Diagnosis not present

## 2021-03-08 DIAGNOSIS — E039 Hypothyroidism, unspecified: Secondary | ICD-10-CM | POA: Diagnosis not present

## 2021-03-08 DIAGNOSIS — N400 Enlarged prostate without lower urinary tract symptoms: Secondary | ICD-10-CM | POA: Diagnosis not present

## 2021-03-08 DIAGNOSIS — C61 Malignant neoplasm of prostate: Secondary | ICD-10-CM | POA: Diagnosis not present

## 2021-03-08 DIAGNOSIS — K219 Gastro-esophageal reflux disease without esophagitis: Secondary | ICD-10-CM | POA: Diagnosis not present

## 2021-03-08 DIAGNOSIS — E782 Mixed hyperlipidemia: Secondary | ICD-10-CM | POA: Diagnosis not present

## 2021-03-30 DIAGNOSIS — R748 Abnormal levels of other serum enzymes: Secondary | ICD-10-CM | POA: Diagnosis not present

## 2021-04-04 DIAGNOSIS — E039 Hypothyroidism, unspecified: Secondary | ICD-10-CM | POA: Diagnosis not present

## 2021-05-14 ENCOUNTER — Other Ambulatory Visit: Payer: Self-pay | Admitting: Internal Medicine

## 2021-05-14 DIAGNOSIS — R109 Unspecified abdominal pain: Secondary | ICD-10-CM

## 2021-05-14 DIAGNOSIS — R42 Dizziness and giddiness: Secondary | ICD-10-CM | POA: Diagnosis not present

## 2021-05-15 ENCOUNTER — Other Ambulatory Visit: Payer: Medicare HMO

## 2021-05-17 ENCOUNTER — Other Ambulatory Visit: Payer: Self-pay | Admitting: Gastroenterology

## 2021-05-17 ENCOUNTER — Other Ambulatory Visit: Payer: Self-pay | Admitting: Internal Medicine

## 2021-05-17 DIAGNOSIS — R7989 Other specified abnormal findings of blood chemistry: Secondary | ICD-10-CM

## 2021-05-17 DIAGNOSIS — K831 Obstruction of bile duct: Secondary | ICD-10-CM

## 2021-05-17 DIAGNOSIS — R109 Unspecified abdominal pain: Secondary | ICD-10-CM

## 2021-05-18 ENCOUNTER — Other Ambulatory Visit: Payer: Self-pay | Admitting: Gastroenterology

## 2021-05-18 ENCOUNTER — Ambulatory Visit
Admission: RE | Admit: 2021-05-18 | Discharge: 2021-05-18 | Disposition: A | Discharge status: Home / Self Care | Payer: Medicare HMO | Source: Ambulatory Visit | Attending: Gastroenterology | Admitting: Gastroenterology

## 2021-05-18 DIAGNOSIS — R109 Unspecified abdominal pain: Secondary | ICD-10-CM

## 2021-05-18 DIAGNOSIS — R7989 Other specified abnormal findings of blood chemistry: Secondary | ICD-10-CM

## 2021-05-18 DIAGNOSIS — K828 Other specified diseases of gallbladder: Secondary | ICD-10-CM | POA: Diagnosis not present

## 2021-05-18 DIAGNOSIS — K8689 Other specified diseases of pancreas: Secondary | ICD-10-CM | POA: Diagnosis not present

## 2021-05-18 DIAGNOSIS — K838 Other specified diseases of biliary tract: Secondary | ICD-10-CM | POA: Diagnosis not present

## 2021-05-18 DIAGNOSIS — K831 Obstruction of bile duct: Secondary | ICD-10-CM

## 2021-05-18 MED ORDER — GADOBENATE DIMEGLUMINE 529 MG/ML IV SOLN
15.0000 mL | Freq: Once | INTRAVENOUS | Status: AC | PRN
  Administered 2021-05-18: 15 mL via INTRAVENOUS

## 2021-05-23 ENCOUNTER — Encounter (HOSPITAL_COMMUNITY): Payer: Self-pay | Admitting: Gastroenterology

## 2021-05-23 ENCOUNTER — Encounter (HOSPITAL_COMMUNITY)
Admission: RE | Disposition: A | Discharge status: Home / Self Care | Payer: Self-pay | Source: Home / Self Care | Attending: Gastroenterology

## 2021-05-23 ENCOUNTER — Ambulatory Visit (HOSPITAL_COMMUNITY): Payer: Medicare HMO | Admitting: Anesthesiology

## 2021-05-23 ENCOUNTER — Other Ambulatory Visit: Payer: Self-pay

## 2021-05-23 ENCOUNTER — Ambulatory Visit (HOSPITAL_COMMUNITY): Payer: Medicare HMO

## 2021-05-23 ENCOUNTER — Ambulatory Visit (HOSPITAL_COMMUNITY)
Admission: RE | Admit: 2021-05-23 | Discharge: 2021-05-23 | Disposition: A | Discharge status: Home / Self Care | Payer: Medicare HMO | Attending: Gastroenterology | Admitting: Gastroenterology

## 2021-05-23 DIAGNOSIS — R945 Abnormal results of liver function studies: Secondary | ICD-10-CM | POA: Diagnosis not present

## 2021-05-23 DIAGNOSIS — R932 Abnormal findings on diagnostic imaging of liver and biliary tract: Secondary | ICD-10-CM | POA: Diagnosis not present

## 2021-05-23 DIAGNOSIS — I10 Essential (primary) hypertension: Secondary | ICD-10-CM | POA: Diagnosis not present

## 2021-05-23 DIAGNOSIS — Z4689 Encounter for fitting and adjustment of other specified devices: Secondary | ICD-10-CM

## 2021-05-23 DIAGNOSIS — Z79890 Hormone replacement therapy (postmenopausal): Secondary | ICD-10-CM | POA: Diagnosis not present

## 2021-05-23 DIAGNOSIS — Z888 Allergy status to other drugs, medicaments and biological substances status: Secondary | ICD-10-CM | POA: Insufficient documentation

## 2021-05-23 DIAGNOSIS — E039 Hypothyroidism, unspecified: Secondary | ICD-10-CM | POA: Diagnosis not present

## 2021-05-23 DIAGNOSIS — K831 Obstruction of bile duct: Secondary | ICD-10-CM | POA: Insufficient documentation

## 2021-05-23 DIAGNOSIS — Z85820 Personal history of malignant melanoma of skin: Secondary | ICD-10-CM | POA: Insufficient documentation

## 2021-05-23 DIAGNOSIS — K219 Gastro-esophageal reflux disease without esophagitis: Secondary | ICD-10-CM | POA: Diagnosis not present

## 2021-05-23 DIAGNOSIS — R17 Unspecified jaundice: Secondary | ICD-10-CM | POA: Diagnosis not present

## 2021-05-23 DIAGNOSIS — Z7982 Long term (current) use of aspirin: Secondary | ICD-10-CM | POA: Insufficient documentation

## 2021-05-23 DIAGNOSIS — K838 Other specified diseases of biliary tract: Secondary | ICD-10-CM | POA: Diagnosis not present

## 2021-05-23 DIAGNOSIS — K8689 Other specified diseases of pancreas: Secondary | ICD-10-CM | POA: Diagnosis not present

## 2021-05-23 DIAGNOSIS — R748 Abnormal levels of other serum enzymes: Secondary | ICD-10-CM | POA: Diagnosis not present

## 2021-05-23 DIAGNOSIS — N400 Enlarged prostate without lower urinary tract symptoms: Secondary | ICD-10-CM | POA: Diagnosis not present

## 2021-05-23 DIAGNOSIS — R7989 Other specified abnormal findings of blood chemistry: Secondary | ICD-10-CM | POA: Diagnosis not present

## 2021-05-23 DIAGNOSIS — K839 Disease of biliary tract, unspecified: Secondary | ICD-10-CM | POA: Diagnosis not present

## 2021-05-23 DIAGNOSIS — K828 Other specified diseases of gallbladder: Secondary | ICD-10-CM | POA: Diagnosis not present

## 2021-05-23 DIAGNOSIS — R933 Abnormal findings on diagnostic imaging of other parts of digestive tract: Secondary | ICD-10-CM | POA: Diagnosis not present

## 2021-05-23 DIAGNOSIS — E782 Mixed hyperlipidemia: Secondary | ICD-10-CM | POA: Diagnosis not present

## 2021-05-23 DIAGNOSIS — C250 Malignant neoplasm of head of pancreas: Secondary | ICD-10-CM | POA: Insufficient documentation

## 2021-05-23 DIAGNOSIS — Z79899 Other long term (current) drug therapy: Secondary | ICD-10-CM | POA: Diagnosis not present

## 2021-05-23 DIAGNOSIS — E038 Other specified hypothyroidism: Secondary | ICD-10-CM | POA: Diagnosis not present

## 2021-05-23 HISTORY — PX: ENDOSCOPIC RETROGRADE CHOLANGIOPANCREATOGRAPHY (ERCP) WITH PROPOFOL: SHX5810

## 2021-05-23 HISTORY — PX: BILIARY STENT PLACEMENT: SHX5538

## 2021-05-23 HISTORY — PX: EUS: SHX5427

## 2021-05-23 HISTORY — PX: FINE NEEDLE ASPIRATION: SHX5430

## 2021-05-23 SURGERY — ENDOSCOPIC RETROGRADE CHOLANGIOPANCREATOGRAPHY (ERCP) WITH PROPOFOL
Anesthesia: General

## 2021-05-23 MED ORDER — PHENYLEPHRINE HCL-NACL 20-0.9 MG/250ML-% IV SOLN
INTRAVENOUS | Status: DC | PRN
  Administered 2021-05-23: 40 ug/min via INTRAVENOUS

## 2021-05-23 MED ORDER — INDOMETHACIN 50 MG RE SUPP
RECTAL | Status: AC
  Filled 2021-05-23: qty 2

## 2021-05-23 MED ORDER — DEXAMETHASONE SODIUM PHOSPHATE 10 MG/ML IJ SOLN
INTRAMUSCULAR | Status: DC | PRN
  Administered 2021-05-23: 8 mg via INTRAVENOUS

## 2021-05-23 MED ORDER — FENTANYL CITRATE (PF) 100 MCG/2ML IJ SOLN
INTRAMUSCULAR | Status: DC | PRN
  Administered 2021-05-23: 100 ug via INTRAVENOUS

## 2021-05-23 MED ORDER — LIDOCAINE HCL (CARDIAC) PF 100 MG/5ML IV SOSY
PREFILLED_SYRINGE | INTRAVENOUS | Status: DC | PRN
  Administered 2021-05-23: 60 mg via INTRAVENOUS

## 2021-05-23 MED ORDER — ONDANSETRON HCL 4 MG/2ML IJ SOLN
INTRAMUSCULAR | Status: DC | PRN
  Administered 2021-05-23: 4 mg via INTRAVENOUS

## 2021-05-23 MED ORDER — PROPOFOL 10 MG/ML IV BOLUS
INTRAVENOUS | Status: AC
  Filled 2021-05-23: qty 20

## 2021-05-23 MED ORDER — PHENYLEPHRINE HCL (PRESSORS) 10 MG/ML IV SOLN
INTRAVENOUS | Status: AC
  Filled 2021-05-23: qty 2

## 2021-05-23 MED ORDER — PROPOFOL 10 MG/ML IV BOLUS
INTRAVENOUS | Status: DC | PRN
  Administered 2021-05-23: 130 mg via INTRAVENOUS

## 2021-05-23 MED ORDER — SUGAMMADEX SODIUM 200 MG/2ML IV SOLN
INTRAVENOUS | Status: DC | PRN
  Administered 2021-05-23: 200 mg via INTRAVENOUS

## 2021-05-23 MED ORDER — EPHEDRINE SULFATE 50 MG/ML IJ SOLN
INTRAMUSCULAR | Status: DC | PRN
  Administered 2021-05-23: 5 mg via INTRAVENOUS
  Administered 2021-05-23 (×2): 10 mg via INTRAVENOUS

## 2021-05-23 MED ORDER — CIPROFLOXACIN IN D5W 400 MG/200ML IV SOLN
INTRAVENOUS | Status: DC | PRN
  Administered 2021-05-23: 400 mg via INTRAVENOUS

## 2021-05-23 MED ORDER — PHENYLEPHRINE HCL (PRESSORS) 10 MG/ML IV SOLN
INTRAVENOUS | Status: DC | PRN
  Administered 2021-05-23: 80 ug via INTRAVENOUS
  Administered 2021-05-23: 40 ug via INTRAVENOUS
  Administered 2021-05-23 (×2): 80 ug via INTRAVENOUS
  Administered 2021-05-23: 40 ug via INTRAVENOUS
  Administered 2021-05-23: 80 ug via INTRAVENOUS

## 2021-05-23 MED ORDER — LACTATED RINGERS IV SOLN: INTRAVENOUS | Status: DC

## 2021-05-23 MED ORDER — SUCCINYLCHOLINE CHLORIDE 200 MG/10ML IV SOSY
PREFILLED_SYRINGE | INTRAVENOUS | Status: DC | PRN
  Administered 2021-05-23: 120 mg via INTRAVENOUS

## 2021-05-23 MED ORDER — ROCURONIUM BROMIDE 100 MG/10ML IV SOLN
INTRAVENOUS | Status: DC | PRN
  Administered 2021-05-23 (×2): 10 mg via INTRAVENOUS
  Administered 2021-05-23: 30 mg via INTRAVENOUS

## 2021-05-23 MED ORDER — FENTANYL CITRATE (PF) 100 MCG/2ML IJ SOLN
INTRAMUSCULAR | Status: AC
  Filled 2021-05-23: qty 2

## 2021-05-23 MED ORDER — CIPROFLOXACIN IN D5W 400 MG/200ML IV SOLN
INTRAVENOUS | Status: AC
  Filled 2021-05-23: qty 200

## 2021-05-23 NOTE — Anesthesia Procedure Notes (Signed)
Procedure Name: Intubation Date/Time: 05/23/2021 11:12 AM Performed by: Aline Brochure, CRNA Pre-anesthesia Checklist: Patient identified, Patient being monitored, Timeout performed, Emergency Drugs available and Suction available Patient Re-evaluated:Patient Re-evaluated prior to induction Oxygen Delivery Method: Circle system utilized Preoxygenation: Pre-oxygenation with 100% oxygen Induction Type: IV induction Ventilation: Mask ventilation without difficulty Laryngoscope Size: Mac and 4 Grade View: Grade I Tube type: Oral Tube size: 7.5 mm Number of attempts: 1 Airway Equipment and Method: Stylet Placement Confirmation: ETT inserted through vocal cords under direct vision, positive ETCO2 and breath sounds checked- equal and bilateral Secured at: 22 cm Tube secured with: Tape Dental Injury: Teeth and Oropharynx as per pre-operative assessment

## 2021-05-23 NOTE — Anesthesia Postprocedure Evaluation (Signed)
Anesthesia Post Note  Patient: David Jarvis  Procedure(s) Performed: ENDOSCOPIC RETROGRADE CHOLANGIOPANCREATOGRAPHY (ERCP) WITH PROPOFOL BILIARY STENT PLACEMENT UPPER ENDOSCOPIC ULTRASOUND (EUS) LINEAR FINE NEEDLE ASPIRATION (FNA) LINEAR     Patient location during evaluation: PACU Anesthesia Type: General Level of consciousness: awake and alert and oriented Pain management: pain level controlled Vital Signs Assessment: post-procedure vital signs reviewed and stable Respiratory status: spontaneous breathing, nonlabored ventilation and respiratory function stable Cardiovascular status: blood pressure returned to baseline and stable Postop Assessment: no apparent nausea or vomiting Anesthetic complications: no   No notable events documented.  Last Vitals:  Vitals:   05/23/21 1320 05/23/21 1330  BP: (!) 125/59 124/63  Pulse: 64 63  Resp: (!) 9 13  Temp:    SpO2: 98% 97%    Last Pain:  Vitals:   05/23/21 1330  TempSrc:   PainSc: 0-No pain                 Jackqulyn Mendel A.

## 2021-05-23 NOTE — H&P (Signed)
[facility] Admission Note  Chief Complaint: Jaundice  HPI: David Jarvis is an 70 y.o. male.  Couple weeks of abdominal pain and jaundice.  Imaging showed suspected pancreatic head mass.  Similar presentation months ago, no mass seen at that time, suspected CBD sludge etiology at that time.  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   right temple (MOHS)   BPV (benign positional vertigo)    Colon polyps    GERD (gastroesophageal reflux disease)    Glucose intolerance (impaired glucose tolerance)    History of nuclear stress test    ETT-Myoview 2/18: EF 57, no ischemia, low risk   Hyperlipidemia    Hypertension    Melanoma ([place]) 07/22/2008   RIGHT TEMPLE END STAGE LICHENORO REGRESSION MELANODERMA TX MOHS   Right knee pain    SCC (squamous cell carcinoma) 02/24/2013   left inner forearm ([id number])   Vitreous floaters of both eyes     Past Surgical History:  Procedure Laterality Date   ARTHROSCOPIC REPAIR ACL Right    BILIARY BRUSHING  10/06/2020   Procedure: BILIARY BRUSHING;  Surgeon: Ronnette Juniper, MD;  Location: [facility];  Service: Gastroenterology;;   cardiac stress test  08/2008   COLONOSCOPY W/ POLYPECTOMY  2006   ERCP N/A 10/06/2020   Procedure: ENDOSCOPIC RETROGRADE CHOLANGIOPANCREATOGRAPHY (ERCP);  Surgeon: Ronnette Juniper, MD;  Location: [facility];  Service: Gastroenterology;  Laterality: N/A;   I & D EXTREMITY Left 02/03/2020   Procedure: IRRIGATION AND DEBRIDEMENT EXTREMITY;  Surgeon: Dayna Barker, MD;  Location: [facility];  Service: Plastics;  Laterality: Left;   laser surgery of the eye     SPHINCTEROTOMY  10/06/2020   Procedure: SPHINCTEROTOMY;  Surgeon: Ronnette Juniper, MD;  Location: [facility];  Service: Gastroenterology;;  balloon sweep    Medications Prior to Admission  Medication Sig Dispense Refill   aspirin EC 81 MG tablet Take 81 mg  by mouth daily.     levothyroxine (SYNTHROID) 25 MCG tablet Take 25 mcg by mouth every morning.     pantoprazole (PROTONIX) 40 MG tablet Take 40 mg by mouth daily.     tamsulosin (FLOMAX) 0.4 MG CAPS capsule Take 0.4 mg by mouth daily.     valsartan-hydrochlorothiazide (DIOVAN-HCT) 80-12.5 MG tablet Take 0.5 tablets by mouth daily.     amLODipine (NORVASC) 5 MG tablet Take 1 tablet (5 mg total) by mouth daily. (Patient not taking: No sig reported) 30 tablet 2   atorvastatin (LIPITOR) 10 MG tablet Take 10 mg by mouth daily.      Allergies:  Allergies  Allergen Reactions   Lisinopril Cough    Family History  Problem Relation Age of Onset   CAD Mother    Diabetes Mother    Heart attack Mother 40   CVA Father    CVA Sister    CAD Brother    Diabetes Brother    Heart attack Brother 54   Heart failure Neg Hx     Social History:  reports that he has never smoked. He has never used smokeless tobacco. He reports current alcohol use. He reports that he does not use drugs.   ROS: As per HPI, all others negative   Blood pressure 116/70, pulse 62, temperature 98.4 F (36.9 C), temperature source Oral, resp. rate 12, height 5\' 6"  (1.676 m), weight 72.6 kg, SpO2 99 %. General appearance: NAD HEENT:  Icteric  sclera NECK:  Supple NEURO:  Non focal without lateralizing signs SKIN:  Jaundiced ABD:  Soft  No results found for this or any previous visit (from the past 55 hour(s)). No results found.  Assessment/Plan   Obstructive jaundice. Pancreatic mass. Risks (bleeding, infection, bowel perforation that could require surgery, sedation-related changes in cardiopulmonary systems), benefits (identification and possible treatment of source of symptoms, exclusion of certain causes of symptoms), and alternatives (watchful waiting, radiographic imaging studies, empiric medical treatment) of upper endoscopy with ultrasound and likely fine needle aspiration (EUS +/- FNA) were explained to  patient/family in detail and patient wishes to proceed.  Risks (up to and including bleeding, infection, perforation, pancreatitis that can be complicated by infected necrosis and death), benefits (removal of stones, alleviating blockage, decreasing risk of cholangitis or choledocholithiasis-related pancreatitis), and alternatives (watchful waiting, percutaneous transhepatic cholangiography) of ERCP were explained to patient/family in detail and patient elects to proceed.   Landry Dyke 05/23/2021, 10:25 AM

## 2021-05-23 NOTE — Op Note (Signed)
[facility] Patient Name: David Jarvis Procedure Date: 05/23/2021 MRN: [id number] Attending MD: Ronnette Juniper , MD Date of Birth: Apr 06, 1951 CSN: [id number] Age: 70 Admit Type: Outpatient Procedure:                ERCP Indications:              Jaundice, Elevated liver enzymes, Malignant tumor                            of the head of pancreas Providers:                Ronnette Juniper, MD, Grace Isaac, RN, Tyrone Apple,                            Technician, Luan Moore, Technician, Aline Brochure, CRNA Referring MD:             Hart Rochester Medicines:                Monitored Anesthesia Care Complications:            No immediate complications. Estimated blood loss:                            Minimal. Estimated Blood Loss:     Estimated blood loss was minimal. Procedure:                Pre-Anesthesia Assessment:                           - Prior to the procedure, a History and Physical                            was performed, and patient medications and                            allergies were reviewed. The patient's tolerance of                            previous anesthesia was also reviewed. The risks                            and benefits of the procedure and the sedation                            options and risks were discussed with the patient.                            All questions were answered, and informed consent                            was obtained. Prior Anticoagulants: The patient has                            taken no previous anticoagulant  or antiplatelet                            agents. ASA Grade Assessment: II - A patient with                            mild systemic disease. After reviewing the risks                            and benefits, the patient was deemed in                            satisfactory condition to undergo the procedure.                           After obtaining informed consent,  the scope was                            passed under direct vision. Throughout the                            procedure, the patient's blood pressure, pulse, and                            oxygen saturations were monitored continuously. The                            [id number]-Q180V ([id number]) Olympus duodenoscope was                            introduced through the mouth, and used to inject                            contrast into and used to inject contrast into the                            bile duct. The ERCP was accomplished without                            difficulty. The patient tolerated the procedure                            well. Scope In: Scope Out: Findings:      The scout film was normal. The esophagus was successfully intubated       under direct vision. The scope was advanced to a normal major papilla in       the descending duodenum without detailed examination of the pharynx,       larynx and associated structures, and upper GI tract. The upper GI tract       was grossly normal.      The ampulla was friable, erythematous and had blood clots likely related       to EUS and FNA.      The bile duct was deeply cannulated with the sphincterotome. Contrast       was  injected. I personally interpreted the bile duct images. There was       brisk flow of contrast through the ducts. Image quality was excellent.       Contrast extended to the main bile duct. The middle third of the main       bile duct and upper third of the main bile duct were severely dilated.       The largest diameter was 20 mm. The lower third of the main bile duct       contained localized stricture likely due to extrinsic compression from       pancreatic head mass. A straight Roadrunner wire was passed into the       biliary tree.      One 10 mm by 4 cm metal stent was placed 3.5 cm into the common bile       duct. Bile flowed through the stent. The stent was in good position.      The pancreatic duct was  not canulated or injected during the procedure.      The patient was given IV Cipro X 1 dose. 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                           - The upper third of the main bile duct and middle                            third of the main bile duct were severely dilated.                           - One metal stent was placed into the common bile                            duct. Moderate Sedation:      Patient did not receive moderate sedation for this procedure, but       instead received monitored anesthesia care. Recommendation:           - Advance diet as tolerated.                           - Refer to biliary surgery asap and oncology.                           -Patient has adenocarcinoma of pancreatic head and                            has localized disease, he should be a good surgical                            candidate. Procedure Code(s):        --- Professional ---  00712, Endoscopic retrograde                            cholangiopancreatography (ERCP); with placement of                            endoscopic stent into biliary or pancreatic duct,                            including pre- and post-dilation and guide wire                            passage, when performed, including sphincterotomy,                            when performed, each stent Diagnosis Code(s):        --- Professional ---                           K83.1, Obstruction of bile duct                           R17, Unspecified jaundice                           R74.8, Abnormal levels of other serum enzymes                           C25.0, Malignant neoplasm of head of pancreas                           K83.8, Other specified diseases of biliary tract CPT copyright 2019 [employer]. All rights reserved. The codes documented in this report are preliminary  and upon coder review may  be revised to meet current compliance requirements. Ronnette Juniper, MD 05/23/2021 12:54:19 PM This report has been signed electronically. Number of Addenda: 0

## 2021-05-23 NOTE — Op Note (Signed)
[facility] Patient Name: David Jarvis Procedure Date: 05/23/2021 MRN: [id number] Attending MD: Arta Silence , MD Date of Birth: April 03, 1951 CSN: [id number] Age: 70 Admit Type: Outpatient Procedure:                Upper EUS Indications:              Common bile duct dilation (acquired) seen on MRI,                            Dilated pancreatic duct on MRI, Suspected mass in                            pancreas on MRI Providers:                Arta Silence, MD, Grace Isaac, RN, Tyrone Apple, Technician, Luan Moore, Technician,                            Aline Brochure, CRNA Referring MD:              Medicines:                General Anesthesia Complications:            No immediate complications. Estimated Blood Loss:     Estimated blood loss: none. Procedure:                Pre-Anesthesia Assessment:                           - Prior to the procedure, a History and Physical                            was performed, and patient medications and                            allergies were reviewed. The patient's tolerance of                            previous anesthesia was also reviewed. The risks                            and benefits of the procedure and the sedation                            options and risks were discussed with the patient.                            All questions were answered, and informed consent                            was obtained. Prior Anticoagulants: The patient has                            taken no  previous anticoagulant or antiplatelet                            agents except for aspirin. ASA Grade Assessment:                            III - A patient with severe systemic disease. After                            reviewing the risks and benefits, the patient was                            deemed in satisfactory condition to undergo the                            procedure.                            After obtaining informed consent, the endoscope was                            passed under direct vision. Throughout the                            procedure, the patient's blood pressure, pulse, and                            oxygen saturations were monitored continuously. The                            [id number] ([id number]) Olympus linear ultrasound scope                            was introduced through the mouth, and advanced to                            the second part of duodenum. The upper EUS was                            accomplished without difficulty. The patient                            tolerated the procedure well. Scope In: Scope Out: Findings:      ENDOSONOGRAPHIC FINDING: :      There was dilation in the common bile duct, in the main bile duct, in       the common hepatic duct, in the cystic duct and in the gallbladder which       measured up to 15 mm.      Diffuse intrahepatic biliary ductal dilatation noted. No mass seen in       left lobe of liver.      There was no sign of significant endosonographic abnormality in the left       lobe of the liver.      No lymphadenopathy seen.      The region  of the celiac plexus and celiac ganglia was visualized and       showed no sign of significant endosonographic abnormality. The vascular       anatomy of the region was normal.      There was no sign of significant endosonographic abnormality involving       the superior mesenteric artery.      There was no sign of significant endosonographic abnormality involving       the portal vein or superior mesenteric vein.      An irregular mass was identified in the pancreatic head. The mass was       hypoechoic. The mass measured 25 mm by 15 mm in maximal cross-sectional       diameter. The endosonographic borders were poorly-defined. The remainder       of the pancreas was examined. The endosonographic appearance of       parenchyma and the upstream pancreatic duct  indicated duct dilation.       Fine needle aspiration for cytology was performed. Color Doppler imaging       was utilized prior to needle puncture to confirm a lack of significant       vascular structures within the needle path. Three passes were made with       the 25 gauge needle using a transduodenal approach. A stylet was used. A       cytologist was present and performed a preliminary cytologic       examination. The cellularity of the specimen was adequate. Impression:               - There was dilation in the common bile duct, in                            the entire main bile duct, in the common hepatic                            duct, in the cystic duct and in the gallbladder                            which measured up to 15 mm.                           - There was no evidence of significant pathology in                            the left lobe of the liver.                           - The superior mesenteric artery was                            endosonographically normal.                           - The portal vein and superior mesenteric vein were                            endosonographically normal.                           -  A mass was identified in the pancreatic head.                            This was staged T2 N0 Mx by endosonographic                            criteria. Fine needle aspiration performed. Moderate Sedation:      Not Applicable - Patient had care per Anesthesia. Recommendation:           - Perform an ERCP today. Procedure Code(s):        --- Professional ---                           [id number], Esophagogastroduodenoscopy, flexible,                            transoral; with transendoscopic ultrasound-guided                            intramural or transmural fine needle                            aspiration/biopsy(s) (includes endoscopic                            ultrasound examination of the esophagus, stomach,                            and either  the duodenum or a surgically altered                            stomach where the jejunum is examined distal to the                            anastomosis) Diagnosis Code(s):        --- Professional ---                           K86.89, Other specified diseases of pancreas                           K83.8, Other specified diseases of biliary tract                           K82.8, Other specified diseases of gallbladder                           R93.3, Abnormal findings on diagnostic imaging of                            other parts of digestive tract CPT copyright 2019 American [employer] Association. All rights reserved. The codes documented in this report are preliminary and upon coder review may  be revised to meet current compliance requirements. Arta Silence, MD 05/23/2021 12:40:12 PM This report has been signed electronically. Number of Addenda: 0

## 2021-05-23 NOTE — Transfer of Care (Signed)
Immediate Anesthesia Transfer of Care Note  Patient: David Jarvis  Procedure(s) Performed: ENDOSCOPIC RETROGRADE CHOLANGIOPANCREATOGRAPHY (ERCP) WITH PROPOFOL UPPER ENDOSCOPIC ULTRASOUND (EUS) LINEAR FINE NEEDLE ASPIRATION (FNA) LINEAR  Patient Location: Endoscopy Unit  Anesthesia Type:General  Level of Consciousness: awake  Airway & Oxygen Therapy: Patient Spontanous Breathing and Patient connected to face mask oxygen  Post-op Assessment: Report given to RN and Post -op Vital signs reviewed and stable  Post vital signs: Reviewed and stable  Last Vitals:  Vitals Value Taken Time  BP 121/52   Temp    Pulse 67   Resp 12   SpO2 100     Last Pain:  Vitals:   05/23/21 0951  TempSrc: Oral  PainSc: 2          Complications: No notable events documented.

## 2021-05-23 NOTE — Anesthesia Preprocedure Evaluation (Addendum)
Anesthesia Evaluation  Patient identified by MRN, date of birth, ID band Patient awake    Reviewed: Allergy & Precautions, NPO status , Patient's Chart, lab work & pertinent test results  Airway Mallampati: II  TM Distance: >3 FB Neck ROM: Full    Dental  (+) Teeth Intact, Dental Advisory Given, Caps   Pulmonary neg pulmonary ROS,    Pulmonary exam normal breath sounds clear to auscultation       Cardiovascular hypertension, Pt. on medications Normal cardiovascular exam Rhythm:Regular Rate:Normal     Neuro/Psych negative neurological ROS  negative psych ROS   GI/Hepatic GERD  Medicated and Controlled,Elevated LFT's Hx/o pancreatic mass w/ stricture S/P ERCP 2/22   Endo/Other  Hyperlipidemia  Renal/GU negative Renal ROS  negative genitourinary   Musculoskeletal negative musculoskeletal ROS (+)   Abdominal   Peds  Hematology  (+) anemia ,   Anesthesia Other Findings   Reproductive/Obstetrics                            Anesthesia Physical Anesthesia Plan  ASA: 3  Anesthesia Plan: General   Post-op Pain Management:    Induction: Intravenous  PONV Risk Score and Plan: 3 and Treatment may vary due to age or medical condition and Ondansetron  Airway Management Planned: Oral ETT  Additional Equipment:   Intra-op Plan:   Post-operative Plan: Extubation in OR  Informed Consent: I have reviewed the patients History and Physical, chart, labs and discussed the procedure including the risks, benefits and alternatives for the proposed anesthesia with the patient or authorized representative who has indicated his/her understanding and acceptance.     Dental advisory given  Plan Discussed with: CRNA and Anesthesiologist  Anesthesia Plan Comments:         Anesthesia Quick Evaluation

## 2021-05-23 NOTE — Anesthesia Preprocedure Evaluation (Deleted)
Anesthesia Evaluation  Patient identified by MRN, date of birth, ID band Patient awake    Reviewed: Allergy & Precautions, NPO status , Patient's Chart, lab work & pertinent test results, reviewed documented beta blocker date and time   Airway Mallampati: II  TM Distance: >3 FB Neck ROM: Full    Dental no notable dental hx. (+) Teeth Intact, Dental Advisory Given   Pulmonary neg pulmonary ROS,    Pulmonary exam normal breath sounds clear to auscultation       Cardiovascular hypertension, Pt. on medications Normal cardiovascular exam Rhythm:Regular Rate:Normal  10/2016 Myoview  Nuclear stress EF: 57%.  No T wave inversion was noted during stress.  Upsloping ST segment depression ST segment depression of 1 mm was noted during stress in the II, aVF, III, V6, V5 and V4 leads.      Neuro/Psych negative neurological ROS  negative psych ROS   GI/Hepatic Neg liver ROS, GERD  Medicated,Lab Results      Component                Value               Date                      ALT                      218 (H)             10/05/2020                AST                      152 (H)             10/05/2020                ALKPHOS                  331 (H)             10/05/2020                BILITOT                  1.2                 10/05/2020            Pancreatic Ca   Endo/Other  Hyperlipidemia  Renal/GU negative Renal ROSK+ 4.1 Cr 1.19  negative genitourinary   Musculoskeletal negative musculoskeletal ROS (+)   Abdominal   Peds  Hematology negative hematology ROS (+) Lab Results      Component                Value               Date                      WBC                      2.9 (L)             10/05/2020                HGB                      13.5  10/05/2020                HCT                      41.0                10/05/2020                MCV                      87.6                10/05/2020                 PLT                      147 (L)             10/05/2020              Anesthesia Other Findings   Reproductive/Obstetrics                             Anesthesia Physical  Anesthesia Plan  ASA: 3  Anesthesia Plan: General   Post-op Pain Management:    Induction: Intravenous  PONV Risk Score and Plan: 2 and Treatment may vary due to age or medical condition and Ondansetron  Airway Management Planned: Oral ETT  Additional Equipment: None  Intra-op Plan:   Post-operative Plan: Extubation in OR  Informed Consent: I have reviewed the patients History and Physical, chart, labs and discussed the procedure including the risks, benefits and alternatives for the proposed anesthesia with the patient or authorized representative who has indicated his/her understanding and acceptance.     Dental advisory given  Plan Discussed with: CRNA and Anesthesiologist  Anesthesia Plan Comments: ( )        Anesthesia Quick Evaluation

## 2021-05-23 NOTE — Discharge Instructions (Signed)

## 2021-05-24 ENCOUNTER — Encounter: Payer: Self-pay | Admitting: *Deleted

## 2021-05-24 ENCOUNTER — Encounter (HOSPITAL_COMMUNITY): Payer: Self-pay | Admitting: Gastroenterology

## 2021-05-24 NOTE — Progress Notes (Signed)
Referral received and chart under review for appropriate scheduling

## 2021-05-25 ENCOUNTER — Inpatient Hospital Stay: Payer: Medicare HMO | Attending: Oncology | Admitting: Oncology

## 2021-05-25 ENCOUNTER — Other Ambulatory Visit: Payer: Self-pay

## 2021-05-25 ENCOUNTER — Encounter: Payer: Self-pay | Admitting: *Deleted

## 2021-05-25 VITALS — BP 109/71 | HR 72 | Temp 97.8°F | Resp 18 | Ht 66.0 in | Wt 156.8 lb

## 2021-05-25 DIAGNOSIS — Z85820 Personal history of malignant melanoma of skin: Secondary | ICD-10-CM

## 2021-05-25 DIAGNOSIS — C250 Malignant neoplasm of head of pancreas: Secondary | ICD-10-CM | POA: Diagnosis not present

## 2021-05-25 DIAGNOSIS — K831 Obstruction of bile duct: Secondary | ICD-10-CM

## 2021-05-25 DIAGNOSIS — K219 Gastro-esophageal reflux disease without esophagitis: Secondary | ICD-10-CM

## 2021-05-25 DIAGNOSIS — E785 Hyperlipidemia, unspecified: Secondary | ICD-10-CM

## 2021-05-25 DIAGNOSIS — I10 Essential (primary) hypertension: Secondary | ICD-10-CM

## 2021-05-25 LAB — CYTOLOGY - NON PAP

## 2021-05-25 NOTE — Progress Notes (Signed)
[name]   Requesting MD: Wenda Low, Md [address] [place],  [place] [address]   David Jarvis 70 y.o.  [birth year]    Reason for Consult: Pancreas cancer   HPI: David Jarvis was admitted on 10/05/2020 with abdominal pain.  He was noted to have elevated liver enzymes at his PCPs office.  And MRCP on 10/05/2020 revealed intra and extra [place] biliary duct dilation.  No discrete mass or filling defect was seen.  An ERCP on 10/06/2020 found the main bile duct to be dilated.  The biliary tree was swept and nothing was found.  Cytology in the lower third of the bile duct revealed no malignant cells.  He reports improvement in abdominal pain following the ERCP in February.  He developed recurrent right-sided abdominal pain beginning in late July.  The pain was worse with eating and exercise.  Approximate 2 weeks ago he noted the onset of jaundice and dark urine.  An MRI/MRCP on 05/18/2021 revealed diffuse biliary ductal dilation and distention of the gallbladder.  The common bile duct measured 18 mm compared to 15 mm previously.  An abrupt stricture was noted at the pancreas head.  A bilobed hypervascular mass was seen in the pancreas head measuring 2.8 x 1.6 cm.  No evidence of vascular involvement.  Diffuse pancreatic atrophy.  Pathologic enlarged lymph nodes.  A repeat ERCP on 05/23/2021 found a localized stricture in the lower third of the main bile duct with proximal dilation.  A stent was placed in the common bile duct. An EUS on 05/23/2021 revealed a dilated common bile duct with diffuse intrahepatic biliary ductal dilation.  No lymphadenopathy.  No involvement of the SMA, SMV, or portal vein.  A 25 x 15 mm mass was noted in the head of the pancreas with poorly defined borders.  The upstream pancreatic duct was dilated.  A fine-needle aspiration biopsy was performed.  The tumor was staged as a T2N0 lesion by ultrasound.  The cytology revealed  malignant cells consistent with adenocarcinoma.  David Jarvis is scheduled to see Dr. Zenia Resides on 05/29/2021.  He reports improvement in abdominal pain today.  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   right temple (MOHS)   BPV (benign positional vertigo)    Colon polyps    GERD (gastroesophageal reflux disease)    Glucose intolerance (impaired glucose tolerance)    History of nuclear stress test    ETT-Myoview 2/18: EF 57, no ischemia, low risk   Hyperlipidemia    Hypertension    Melanoma ([place]) 07/22/2008   RIGHT TEMPLE END STAGE LICHENORO REGRESSION MELANODERMA TX MOHS   Right knee pain    SCC (squamous cell carcinoma) 02/24/2013   left inner forearm ([id number])   Vitreous floaters of both eyes     Past Surgical History:  Procedure Laterality Date   ARTHROSCOPIC REPAIR ACL Right    BILIARY BRUSHING  10/06/2020   Procedure: BILIARY BRUSHING;  Surgeon: Ronnette Juniper, MD;  Location: [facility];  Service: Gastroenterology;;   BILIARY STENT PLACEMENT N/A 05/23/2021   Procedure: BILIARY STENT PLACEMENT;  Surgeon: Ronnette Juniper, MD;  Location: [facility];  Service: Gastroenterology;  Laterality: N/A;   cardiac stress test  08/2008   COLONOSCOPY W/ POLYPECTOMY  2006   ENDOSCOPIC RETROGRADE CHOLANGIOPANCREATOGRAPHY (ERCP) WITH PROPOFOL N/A 05/23/2021   Procedure: ENDOSCOPIC RETROGRADE CHOLANGIOPANCREATOGRAPHY (ERCP)  WITH PROPOFOL;  Surgeon: Ronnette Juniper, MD;  Location: [facility];  Service: Gastroenterology;  Laterality: N/A;   ERCP N/A 10/06/2020   Procedure: ENDOSCOPIC RETROGRADE CHOLANGIOPANCREATOGRAPHY (ERCP);  Surgeon: Ronnette Juniper, MD;  Location: [facility];  Service: Gastroenterology;  Laterality: N/A;   EUS N/A 05/23/2021   Procedure: UPPER ENDOSCOPIC ULTRASOUND (EUS) LINEAR;  Surgeon: Arta Silence, MD;  Location: [facility];  Service: Endoscopy;  Laterality: N/A;    FINE NEEDLE ASPIRATION N/A 05/23/2021   Procedure: FINE NEEDLE ASPIRATION (FNA) LINEAR;  Surgeon: Arta Silence, MD;  Location: [facility];  Service: Endoscopy;  Laterality: N/A;   I & D EXTREMITY Left 02/03/2020   Procedure: IRRIGATION AND DEBRIDEMENT EXTREMITY;  Surgeon: Dayna Barker, MD;  Location: [facility];  Service: Plastics;  Laterality: Left;   laser surgery of the eye     SPHINCTEROTOMY  10/06/2020   Procedure: SPHINCTEROTOMY;  Surgeon: Ronnette Juniper, MD;  Location: [facility];  Service: Gastroenterology;;  balloon sweep    Medications: Reviewed  Allergies:  Allergies  Allergen Reactions   Lisinopril Cough    Family history: A paternal aunt had "cancer ".  Social History:   He lives with his wife in [place].  He has 2 daughters.  He is a retired Optometrist.  He does not use cigarettes.  He reports social alcohol use.  He is a blood donor.  No risk factor for HIV or hepatitis.  No transfusion history.  He has received COVID-19 vaccines.  ROS:   Positives include: 8 pound weight loss, right abdominal pain, "indigestion", dark urine and jaundice for 2 weeks  A complete ROS was otherwise negative.  Physical Exam:  Blood pressure 109/71, pulse 72, temperature 97.8 F (36.6 C), temperature source Oral, resp. rate 18, height 5\' 6"  (1.676 m), weight 156 lb 12.8 oz (71.1 kg), SpO2 99 %.  HEENT: Oropharynx without visible mass Lungs: Clear bilaterally Cardiac: Regular rate and rhythm Abdomen: No mass, nontender, no hepatosplenomegaly, no apparent ascites  Vascular: No leg edema Lymph nodes: No cervical, supraclavicular, axillary, or inguinal nodes Neurologic: Alert and oriented, motor exam appears intact in the upper and lower extremities bilaterally Skin: No rash Musculoskeletal: No spine tenderness   LAB:  CBC  Lab Results  Component Value Date   WBC 4.0 10/06/2020   HGB 12.5 (L) 10/06/2020   HCT 38.1 (L) 10/06/2020   MCV 88.0 10/06/2020   PLT 151 10/06/2020    NEUTROABS 1.3 (L) 10/05/2020        CMP  Lab Results  Component Value Date   NA 141 10/06/2020   K 4.2 10/06/2020   CL 107 10/06/2020   CO2 25 10/06/2020   GLUCOSE 107 (H) 10/06/2020   BUN 10 10/06/2020   CREATININE 1.16 10/06/2020   CALCIUM 8.6 (L) 10/06/2020   PROT 6.2 (L) 10/06/2020   ALBUMIN 3.5 10/06/2020   AST 170 (H) 10/06/2020   ALT 216 (H) 10/06/2020   ALKPHOS 343 (H) 10/06/2020   BILITOT 1.2 10/06/2020   GFRNONAA >60 10/06/2020      Imaging: As per HPI    Assessment/Plan:   Pancreas cancer, adenocarcinoma MRI/MRCP abdomen 05/18/2021-diffuse biliary and pancreatic duct dilation, 2.8 cm mass in the pancreas head, no evidence of metastatic disease ERCP 05/23/2021-localized stricture in the lower third of the main bile duct, stent placed EUS 05/23/2021-25 x 15 mm pancreas head mass with irregular borders, no lymphadenopathy, no vascular involvement, T2 N0 by ultrasound Obstructive jaundice secondary #1 Hypertension Admission in February 2022 with elevated liver  enzymes MRI/MRCP-intra and extrahepatic biliary duct dilation, tapering smoothly to the ampulla, no mass or filling defect ERC 10/26/2020-dilated main bile duct, biliary tree was swept and nothing was found, cytology from bile duct brushing-no malignant cells identified 5.  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  GERD 7.  Squamous cell carcinoma left forearm 2014 8.  Hyperlipidemia  Disposition:   Mr Jarvis has been diagnosed with pancreas cancer.  I discussed the treatment of pancreas cancer with David Jarvis and his wife.  He appears to have a resectable tumor based on the staging evaluation to date.  I reviewed the indication for neoadjuvant and adjuvant chemotherapy with him.  He appears to be a candidate for neoadjuvant FOLFIRINOX.  He will be referred for staging CTs to include a pancreas protocol CT to complete his staging evaluation.  We will most likely recommend neoadjuvant FOLFIRINOX if he is  confirmed to have localized disease.  I reviewed potential toxicities associated with the FOLFIRINOX regimen including the chance of nausea/vomiting, mucositis, diarrhea, alopecia, and hematologic toxicity.  We discussed the chance of infection and bleeding.  We reviewed the sun sensitivity, rash, hyperpigmentation, and hand/foot syndrome associated with 5-fluorouracil.  We reviewed the acute and delayed diarrhea seen with irinotecan.  We discussed the allergic reaction and various types of neuropathy seen with oxaliplatin.  He will attend a chemotherapy teaching class.  He is scheduled to see Dr. Zenia Resides next week.  I will ask her to place a Port-A-Cath if she agrees with the plan for neoadjuvant therapy.  He will return for an office visit and further discussion on 06/05/2021.  We will check a CBC, chemistry panel, and CA 19-9 on 06/05/2021.  A chemotherapy plan was entered today.  He will be referred to the genetics counselor.  Betsy Coder, MD  05/25/2021, 3:10 PM

## 2021-05-25 NOTE — Progress Notes (Signed)
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

## 2021-05-25 NOTE — Progress Notes (Signed)
Met with David and David Jarvis with Dr Benay Spice to discuss his diagnosis of Pancreatic Cancer. He has a consult with Dr Zenia Resides on Tuesday to discuss [facility] Procedure as well as PAC placement in order to receive neoadjuvant FOLFIRINOX. Discussed and demonstrated PAC placement and given written information on FOLFIRINOX. Gave contact information to call with any questions or issues.

## 2021-05-28 ENCOUNTER — Encounter: Payer: Self-pay | Admitting: Oncology

## 2021-05-29 DIAGNOSIS — C259 Malignant neoplasm of pancreas, unspecified: Secondary | ICD-10-CM | POA: Diagnosis not present

## 2021-05-30 ENCOUNTER — Other Ambulatory Visit: Payer: Self-pay | Admitting: *Deleted

## 2021-05-30 DIAGNOSIS — C250 Malignant neoplasm of head of pancreas: Secondary | ICD-10-CM

## 2021-05-30 NOTE — Progress Notes (Signed)
Spoke with patient this am and placed nutrition referral per his requests. Also reviewed upcoming appt and scans. Encouraged to call with any issues or questions

## 2021-05-31 ENCOUNTER — Encounter: Payer: Self-pay | Admitting: *Deleted

## 2021-05-31 NOTE — Progress Notes (Signed)
Approval for Pancreas Protocol CT from Peer to Peer [id number]

## 2021-05-31 NOTE — Progress Notes (Signed)
Received staff message that patient's CT scheduled for tomorrow may require a P2P review for approval, called the at [facility] number and asked for information on approval, stated that it was in review and at "level1 review", called again at 1504 and was informed that review was now at Level 2 and that it was marked "urgent".  Will continue to monitor approval. Talked with patient earlier today and made 2nd opinion referral to [facility] oncology team and he now appts at [facility] on 10/11 with Drs De Blanch and Cashion. Will continue to follow, pt stated that he will keep appts here with Dr Benay Spice next week and proceed with [facility] placement as well.

## 2021-06-01 ENCOUNTER — Ambulatory Visit (HOSPITAL_BASED_OUTPATIENT_CLINIC_OR_DEPARTMENT_OTHER): Admission: RE | Admit: 2021-06-01 | Payer: Medicare HMO | Source: Ambulatory Visit

## 2021-06-01 ENCOUNTER — Encounter (HOSPITAL_BASED_OUTPATIENT_CLINIC_OR_DEPARTMENT_OTHER): Payer: Self-pay

## 2021-06-01 ENCOUNTER — Ambulatory Visit (HOSPITAL_BASED_OUTPATIENT_CLINIC_OR_DEPARTMENT_OTHER): Payer: Medicare HMO

## 2021-06-01 ENCOUNTER — Other Ambulatory Visit: Payer: Self-pay

## 2021-06-01 ENCOUNTER — Ambulatory Visit (HOSPITAL_BASED_OUTPATIENT_CLINIC_OR_DEPARTMENT_OTHER)
Admission: RE | Admit: 2021-06-01 | Discharge: 2021-06-01 | Disposition: A | Discharge status: Home / Self Care | Payer: Medicare HMO | Source: Ambulatory Visit | Attending: Oncology | Admitting: Oncology

## 2021-06-01 DIAGNOSIS — C259 Malignant neoplasm of pancreas, unspecified: Secondary | ICD-10-CM | POA: Diagnosis not present

## 2021-06-01 DIAGNOSIS — I700 Atherosclerosis of aorta: Secondary | ICD-10-CM | POA: Diagnosis not present

## 2021-06-01 DIAGNOSIS — C250 Malignant neoplasm of head of pancreas: Secondary | ICD-10-CM | POA: Insufficient documentation

## 2021-06-01 LAB — POCT I-STAT CREATININE: Creatinine, Ser: 1.1 mg/dL (ref 0.61–1.24)

## 2021-06-01 MED ORDER — IOHEXOL 350 MG/ML SOLN
100.0000 mL | Freq: Once | INTRAVENOUS | Status: AC | PRN
  Administered 2021-06-01: 100 mL via INTRAVENOUS

## 2021-06-04 ENCOUNTER — Encounter: Payer: Self-pay | Admitting: *Deleted

## 2021-06-04 DIAGNOSIS — C250 Malignant neoplasm of head of pancreas: Secondary | ICD-10-CM | POA: Diagnosis not present

## 2021-06-04 NOTE — Progress Notes (Signed)
Spoke with pt re: upcoming appts this week, he stated that he is meeting with Dr Barry Dienes this afternoon and then to see Dr Benay Spice tomorrow with patient education as well

## 2021-06-05 ENCOUNTER — Encounter: Payer: Self-pay | Admitting: *Deleted

## 2021-06-05 ENCOUNTER — Inpatient Hospital Stay: Payer: Medicare HMO | Attending: Oncology | Admitting: Oncology

## 2021-06-05 ENCOUNTER — Other Ambulatory Visit: Payer: Self-pay

## 2021-06-05 ENCOUNTER — Inpatient Hospital Stay: Payer: Medicare HMO

## 2021-06-05 ENCOUNTER — Other Ambulatory Visit: Payer: Self-pay | Admitting: *Deleted

## 2021-06-05 VITALS — BP 147/82 | HR 62 | Temp 98.1°F | Resp 18 | Ht 66.0 in | Wt 156.2 lb

## 2021-06-05 DIAGNOSIS — E785 Hyperlipidemia, unspecified: Secondary | ICD-10-CM | POA: Insufficient documentation

## 2021-06-05 DIAGNOSIS — I10 Essential (primary) hypertension: Secondary | ICD-10-CM | POA: Insufficient documentation

## 2021-06-05 DIAGNOSIS — C250 Malignant neoplasm of head of pancreas: Secondary | ICD-10-CM | POA: Insufficient documentation

## 2021-06-05 DIAGNOSIS — Z79899 Other long term (current) drug therapy: Secondary | ICD-10-CM | POA: Diagnosis not present

## 2021-06-05 DIAGNOSIS — Z5111 Encounter for antineoplastic chemotherapy: Secondary | ICD-10-CM | POA: Insufficient documentation

## 2021-06-05 DIAGNOSIS — R748 Abnormal levels of other serum enzymes: Secondary | ICD-10-CM | POA: Insufficient documentation

## 2021-06-05 DIAGNOSIS — K831 Obstruction of bile duct: Secondary | ICD-10-CM | POA: Insufficient documentation

## 2021-06-05 NOTE — Progress Notes (Signed)
  [name] OFFICE PROGRESS NOTE   Diagnosis: Pancreas cancer  INTERVAL HISTORY:   David Jarvis is for a scheduled visit.  No new complaint.  He saw Dr. Barry Jarvis.  She agrees with neoadjuvant FOLFIRINOX.  He is scheduled for a multidisciplinary opinion at [facility] next week.  Objective:  Vital signs in last 24 hours:  Blood pressure (!) 147/82, pulse 62, temperature 98.1 F (36.7 C), resp. rate 18, height $RemoveBe'5\' 6"'[id number]$  (1.676 m), weight 156 lb 3.2 oz (70.9 kg), SpO2 97 %.   Physical examination-not performed today  Lab Results:  Lab Results  Component Value Date   WBC 4.0 10/06/2020   HGB 12.5 (L) 10/06/2020   HCT 38.1 (L) 10/06/2020   MCV 88.0 10/06/2020   PLT 151 10/06/2020   NEUTROABS 1.3 (L) 10/05/2020    CMP  Lab Results  Component Value Date   NA 141 10/06/2020   K 4.2 10/06/2020   CL 107 10/06/2020   CO2 25 10/06/2020   GLUCOSE 107 (H) 10/06/2020   BUN 10 10/06/2020   CREATININE 1.10 06/01/2021   CALCIUM 8.6 (L) 10/06/2020   PROT 6.2 (L) 10/06/2020   ALBUMIN 3.5 10/06/2020   AST 170 (H) 10/06/2020   ALT 216 (H) 10/06/2020   ALKPHOS 343 (H) 10/06/2020   BILITOT 1.2 10/06/2020   GFRNONAA >60 10/06/2020     Medications: I have reviewed the patient's current medications.   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, stent placed EUS 05/23/2021-25 x 15 mm pancreas head mass with irregular borders, no lymphadenopathy, no vascular involvement, T2 N0 by ultrasound CT chest 06/01/2021-negative for metastatic disease CT abdomen pancreas protocol 06/01/2021-3.2 x 2.3 cm hypoenhancing pancreas head mass inseparable from the third portion of the duodenum, 1 cm left retroperitoneal lymph node, no vascular involvement Obstructive jaundice  secondary #1 Hypertension Admission in February 2022 with elevated liver enzymes MRI/MRCP-intra and extrahepatic biliary duct dilation, tapering smoothly to the ampulla, no mass or filling defect ERCP 10/06/2020-dilated main bile duct, biliary tree was swept and nothing was found, cytology from bile duct brushing-no malignant cells identified 5.  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  GERD 7.  Squamous cell carcinoma left forearm 2014 8.  Hyperlipidemia    Disposition: David Jarvis has been diagnosed with pancreas cancer.  He appears to have a resectable tumor.  There is no clear evidence of metastatic disease, though there is a single suspicious left retroperitoneal node.  The left retroperitoneal node may be considered a metastatic site if positive for pancreas cancer.  I think it would be difficult to biopsy this lesion and it may be below PET resolution.  I reviewed the CT images and discussed treatment options with David Jarvis.  I recommend proceeding with neoadjuvant FOLFIRINOX.  We reviewed potential toxicities associated with the FOLFIRINOX regimen again today.  He met with a chemotherapy teaching nurse.  He will be referred for Port-A-Cath placement.  David Jarvis is scheduled for a multidisciplinary appointment at [facility] next week.  The plan is to proceed with neoadjuvant FOLFIRINOX on 06/19/2021 if the [facility] oncology team is in agreement.  Betsy Coder, MD  06/05/2021  4:18 PM

## 2021-06-05 NOTE — Progress Notes (Signed)
Met with David Jarvis and his wife with Dr Benay Spice and discussed upcoming appts to get [facility] placed, order placed and urgent request sent to IR placement due to Dr Barry Dienes on vacation. Second opinion on Tuesday with [facility]. Will f/u as needed

## 2021-06-06 ENCOUNTER — Encounter: Payer: Self-pay | Admitting: Oncology

## 2021-06-06 ENCOUNTER — Other Ambulatory Visit (HOSPITAL_BASED_OUTPATIENT_CLINIC_OR_DEPARTMENT_OTHER): Payer: Self-pay

## 2021-06-06 ENCOUNTER — Other Ambulatory Visit: Payer: Self-pay

## 2021-06-06 ENCOUNTER — Other Ambulatory Visit: Payer: Self-pay | Admitting: *Deleted

## 2021-06-06 ENCOUNTER — Other Ambulatory Visit: Payer: Self-pay | Admitting: Oncology

## 2021-06-06 ENCOUNTER — Inpatient Hospital Stay: Payer: Medicare HMO | Admitting: Nutrition

## 2021-06-06 MED ORDER — LIDOCAINE-PRILOCAINE 2.5-2.5 % EX CREA
1.0000 "application " | TOPICAL_CREAM | CUTANEOUS | 0 refills | Status: DC | PRN
  Filled 2021-06-06: qty 30, 7d supply, fill #0

## 2021-06-06 MED ORDER — PROCHLORPERAZINE MALEATE 10 MG PO TABS
10.0000 mg | ORAL_TABLET | Freq: Four times a day (QID) | ORAL | 0 refills | Status: DC | PRN
  Filled 2021-06-06: qty 30, 8d supply, fill #0

## 2021-06-06 MED ORDER — ONDANSETRON HCL 8 MG PO TABS
8.0000 mg | ORAL_TABLET | Freq: Two times a day (BID) | ORAL | 0 refills | Status: DC
  Filled 2021-06-06: qty 20, 10d supply, fill #0

## 2021-06-06 NOTE — Progress Notes (Signed)
The proposed treatment discussed in conference is for discussion purpose only and is not a binding recommendation.  The patients have not been physically examined, or presented with their treatment options.  Therefore, final treatment plans cannot be decided.  

## 2021-06-06 NOTE — Progress Notes (Signed)
70 year old male diagnosed with Pancreas Cancer and followed by Dr. Benay Spice.  PMH includes GERD, Glucose intolerance, HLD, HTN, Melanoma  Labs reviewed.  Medications include Synthroid, Zofran, Protonix, and Compazine.  Height: 5'6" Weight: 156.2 pounds Oct 4. UBW: 167 pounds Feb 2022. BMI: 25.21.  Patient lives with his wife. He reports he loves to bike and plans on adding strength training and walking during treatment. He is concerned about his weight loss. He also wants to monitor his cholesterol and blood sugars. He has bee drinking ONS twice daily.  Nutrition Diagnosis: Food and Nutrition Related Knowledge Deficit related to cancer and associated treatments as evidenced by no prior need for nutrition related information.  Intervention: Educated to consume small frequent meals and snacks with adequate calories and protein Continue oral nutrition supplements. Increase to Ensure Complete if unable to eat well. Make high calorie smoothies of choice. Reviewed healthy foods that will still allow patient to control blood sugar and cholesterol if desired. Provided coupons and fact sheets. Gave patient my contact information. Questions answered and teach back used.  Monitoring, Evaluation, Goals: Adequate calories, protein for wt maintenance  Next Visit: To be scheduled.

## 2021-06-07 ENCOUNTER — Other Ambulatory Visit: Payer: Self-pay

## 2021-06-07 ENCOUNTER — Other Ambulatory Visit: Payer: Self-pay | Admitting: Genetic Counselor

## 2021-06-07 ENCOUNTER — Inpatient Hospital Stay: Payer: Medicare HMO

## 2021-06-07 ENCOUNTER — Inpatient Hospital Stay (HOSPITAL_BASED_OUTPATIENT_CLINIC_OR_DEPARTMENT_OTHER): Payer: Medicare HMO | Admitting: Genetic Counselor

## 2021-06-07 ENCOUNTER — Encounter: Payer: Self-pay | Admitting: Genetic Counselor

## 2021-06-07 DIAGNOSIS — C250 Malignant neoplasm of head of pancreas: Secondary | ICD-10-CM

## 2021-06-07 DIAGNOSIS — Z803 Family history of malignant neoplasm of breast: Secondary | ICD-10-CM | POA: Diagnosis not present

## 2021-06-07 DIAGNOSIS — Z1379 Encounter for other screening for genetic and chromosomal anomalies: Secondary | ICD-10-CM

## 2021-06-07 DIAGNOSIS — Z8507 Personal history of malignant neoplasm of pancreas: Secondary | ICD-10-CM | POA: Diagnosis not present

## 2021-06-07 LAB — GENETIC SCREENING ORDER

## 2021-06-07 NOTE — Progress Notes (Signed)
REFERRING PROVIDER: Ladell Pier, MD [address] [place],  [place] [address]  PRIMARY PROVIDER:  Wenda Low, MD  PRIMARY REASON FOR VISIT:  Encounter Diagnoses  Name Primary?   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 male, was seen for a [place] cancer genetics consultation at the request of Dr. Benay Spice due to a personal history of cancer.  David Jarvis presents to clinic today to discuss the possibility of a hereditary predisposition to cancer, to discuss genetic testing, and to further clarify his future cancer risks, as well as potential cancer risks for family members.   In 2022, at the age of 3, David Jarvis was diagnosed with pancreatic cancer. He also has a history of prostate cancer diagnosed around age 32, he reports he has not had any treatment for his prostate cancer.   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   right temple (MOHS)   BPV (benign positional vertigo)    Colon polyps    GERD (gastroesophageal reflux disease)    Glucose intolerance (impaired glucose tolerance)    History of nuclear stress test    ETT-Myoview 2/18: EF 57, no ischemia, low risk   Hyperlipidemia    Hypertension    Melanoma ([place]) 07/22/2008   RIGHT TEMPLE END STAGE LICHENORO REGRESSION MELANODERMA TX MOHS    Right knee pain    SCC (squamous cell carcinoma) 02/24/2013   left inner forearm (Cx35FU)   Vitreous floaters of both eyes     Past Surgical History:  Procedure Laterality Date   ARTHROSCOPIC REPAIR ACL Right    BILIARY BRUSHING  10/06/2020   Procedure: BILIARY BRUSHING;  Surgeon: Ronnette Juniper, MD;  Location: [facility];  Service: Gastroenterology;;   BILIARY STENT PLACEMENT N/A 05/23/2021   Procedure: BILIARY STENT PLACEMENT;  Surgeon: Ronnette Juniper, MD;  Location: [facility];  Service: Gastroenterology;  Laterality: N/A;   cardiac stress test  08/2008   COLONOSCOPY W/ POLYPECTOMY  2006   ENDOSCOPIC RETROGRADE CHOLANGIOPANCREATOGRAPHY (ERCP) WITH PROPOFOL N/A 05/23/2021   Procedure: ENDOSCOPIC RETROGRADE CHOLANGIOPANCREATOGRAPHY (ERCP) WITH PROPOFOL;  Surgeon: Ronnette Juniper, MD;  Location: [facility];  Service: Gastroenterology;  Laterality: N/A;   ERCP N/A 10/06/2020   Procedure: ENDOSCOPIC RETROGRADE CHOLANGIOPANCREATOGRAPHY (ERCP);  Surgeon: Ronnette Juniper, MD;  Location: [facility];  Service: Gastroenterology;  Laterality: N/A;   EUS N/A 05/23/2021   Procedure: UPPER ENDOSCOPIC ULTRASOUND (EUS) LINEAR;  Surgeon: Arta Silence, MD;  Location: [facility];  Service: Endoscopy;  Laterality: N/A;   FINE NEEDLE ASPIRATION N/A 05/23/2021   Procedure: FINE NEEDLE ASPIRATION (FNA) LINEAR;  Surgeon: Arta Silence, MD;  Location: [facility];  Service: Endoscopy;  Laterality: N/A;   I & D EXTREMITY Left 02/03/2020   Procedure: IRRIGATION AND DEBRIDEMENT EXTREMITY;  Surgeon:  Dayna Barker, MD;  Location: [facility];  Service: Plastics;  Laterality: Left;   laser surgery of the eye     SPHINCTEROTOMY  10/06/2020   Procedure: SPHINCTEROTOMY;  Surgeon: Ronnette Juniper, MD;  Location: [facility];  Service: Gastroenterology;;  balloon sweep    Social History   Socioeconomic History   Marital status: Married    Spouse name: Not on file   Number of children: Not on file   Years of education: Not on file    Highest education level: Not on file  Occupational History   Not on file  Tobacco Use   Smoking status: Never   Smokeless tobacco: Never  Vaping Use   Vaping Use: Never used  Substance and Sexual Activity   Alcohol use: Yes    Comment: occasionally   Drug use: No   Sexual activity: Yes    Comment: married  Other Topics Concern   Not on file  Social History Narrative   Accountant   Married.  2 daughters.   Lived in [place] for many years.  Born in [place].   [employer]; [employer].   Social Determinants of Health   Financial Resource Strain: Not on file  Food Insecurity: Not on file  Transportation Needs: Not on file  Physical Activity: Not on file  Stress: Not on file  Social Connections: Not on file     FAMILY HISTORY:  We obtained a detailed, 4-generation family history.  Significant diagnoses are listed below:    David Jarvis paternal aunt was diagnosed with an unknown cancer in her 20s, she died in her 44s. This aunt's daughter was diagnosed with breast cancer in her 93s. David Jarvis is unaware of previous family history of genetic testing for hereditary cancer risks. There no reported Ashkenazi Jewish ancestry.   GENETIC COUNSELING ASSESSMENT: David Jarvis is a 70 y.o. male with a personal and family history of cancer which is somewhat suggestive of a hereditary predisposition to cancer given his personal history of pancreatic cancer. We, therefore, discussed and recommended the following at today's visit.   DISCUSSION: We discussed that 5 - 10% of cancer is hereditary, with most cases of breast and pancreatic cancer associated with BRCA1/2.  There are other genes that can be associated with hereditary cancer syndromes.  We discussed that testing is beneficial for several reasons including knowing how to follow individuals after completing their treatment, identifying whether potential treatment options would be beneficial, and understanding  if other family members could be at risk for cancer and allowing them to undergo genetic testing.   We reviewed the characteristics, features and inheritance patterns of hereditary cancer syndromes. We also discussed genetic testing, including the appropriate family members to test, the process of testing, insurance coverage and turn-around-time for results. We discussed the implications of a negative, positive, carrier and/or variant of uncertain significant result. We recommended David Jarvis pursue genetic testing for a panel that includes genes associated with pancreatic cancer.   David Jarvis  was offered a common hereditary cancer panel (47 genes) and an expanded pan-cancer panel (84 genes). David Jarvis was informed of the benefits and limitations of each panel, including that expanded pan-cancer panels contain genes that do not have clear management guidelines at this point in time.  We also discussed that as the number of genes included on a panel increases, the chances of variants of uncertain significance increases.  After considering the benefits and limitations of each gene panel, David Jarvis elected to  have [facility] Multi-Cancer Panel (84 genes)+RNA.   The CancerNext-Expanded gene panel offered by [facility] and includes sequencing, rearrangement, and RNA analysis for the following 77 genes: AIP, ALK, APC, ATM, AXIN2, BAP1, BARD1, BLM, BMPR1A, BRCA1, BRCA2, BRIP1, CDC73, CDH1, CDK4, CDKN1B, CDKN2A, CHEK2, CTNNA1, DICER1, FANCC, FH, FLCN, GALNT12, KIF1B, LZTR1, MAX, MEN1, MET, MLH1, MSH2, MSH3, MSH6, MUTYH, NBN, NF1, NF2, NTHL1, PALB2, PHOX2B, PMS2, POT1, PRKAR1A, PTCH1, PTEN, RAD51C, RAD51D, RB1, RECQL, RET, SDHA, SDHAF2, SDHB, SDHC, SDHD, SMAD4, SMARCA4, SMARCB1, SMARCE1, STK11, SUFU, TMEM127, TP53, TSC1, TSC2, VHL and XRCC2 (sequencing and deletion/duplication); EGFR, EGLN1, HOXB13, KIT, MITF, PDGFRA, POLD1, and POLE (sequencing only); EPCAM and GREM1 (deletion/duplication only).   Based on Mr.  Jarvis personal and family history of cancer, he meets medical criteria for genetic testing. Despite that he meets criteria, he may still have an out of pocket cost. We discussed that if his out of pocket cost for testing is over $100, the laboratory will call and confirm whether he wants to proceed with testing.  If the out of pocket cost of testing is less than $100 he will be billed by the genetic testing laboratory.    PLAN: After considering the risks, benefits, and limitations, David Jarvis provided informed consent to pursue genetic testing and the blood sample was sent to [facility] for analysis of the Multi-Cancer (84 gene) Panel+RNA. Results should be available within approximately 2-3 weeks' time, at which point they will be disclosed by telephone to David Jarvis, as will any additional recommendations warranted by these results. David Jarvis will receive a summary of his genetic counseling visit and a copy of his results once available. This information will also be available in Epic.  David Jarvis questions were answered to his satisfaction today. Our contact information was provided should additional questions or concerns arise. Thank you for the referral and allowing [place] to share in the care of your patient.   Lucille Passy, MS, [facility] Genetic Counselor [email or web address] (P) [phone]  The patient was seen for a total of 30 minutes in face-to-face genetic counseling. The patient was seen alone.  Drs. Magrinat, Lindi Adie and/or Burr Medico were available to discuss this case as needed.   _______________________________________________________________________ For Office Staff:  Number of people involved in session: 1 Was an Intern/ student involved with case: no

## 2021-06-08 ENCOUNTER — Other Ambulatory Visit: Payer: Self-pay | Admitting: Physician Assistant

## 2021-06-11 ENCOUNTER — Ambulatory Visit (HOSPITAL_COMMUNITY)
Admission: RE | Admit: 2021-06-11 | Discharge: 2021-06-11 | Disposition: A | Discharge status: Home / Self Care | Payer: Medicare HMO | Source: Ambulatory Visit | Attending: Oncology | Admitting: Oncology

## 2021-06-11 ENCOUNTER — Encounter (HOSPITAL_COMMUNITY): Payer: Self-pay

## 2021-06-11 DIAGNOSIS — Z7982 Long term (current) use of aspirin: Secondary | ICD-10-CM | POA: Diagnosis not present

## 2021-06-11 DIAGNOSIS — Z79890 Hormone replacement therapy (postmenopausal): Secondary | ICD-10-CM | POA: Diagnosis not present

## 2021-06-11 DIAGNOSIS — K219 Gastro-esophageal reflux disease without esophagitis: Secondary | ICD-10-CM | POA: Diagnosis not present

## 2021-06-11 DIAGNOSIS — E785 Hyperlipidemia, unspecified: Secondary | ICD-10-CM | POA: Insufficient documentation

## 2021-06-11 DIAGNOSIS — Z888 Allergy status to other drugs, medicaments and biological substances status: Secondary | ICD-10-CM | POA: Insufficient documentation

## 2021-06-11 DIAGNOSIS — I10 Essential (primary) hypertension: Secondary | ICD-10-CM | POA: Diagnosis not present

## 2021-06-11 DIAGNOSIS — Z79899 Other long term (current) drug therapy: Secondary | ICD-10-CM | POA: Insufficient documentation

## 2021-06-11 DIAGNOSIS — Z452 Encounter for adjustment and management of vascular access device: Secondary | ICD-10-CM | POA: Diagnosis not present

## 2021-06-11 DIAGNOSIS — C259 Malignant neoplasm of pancreas, unspecified: Secondary | ICD-10-CM | POA: Insufficient documentation

## 2021-06-11 DIAGNOSIS — C250 Malignant neoplasm of head of pancreas: Secondary | ICD-10-CM

## 2021-06-11 HISTORY — PX: IR IMAGING GUIDED PORT INSERTION: IMG5740

## 2021-06-11 MED ORDER — HEPARIN SOD (PORK) LOCK FLUSH 100 UNIT/ML IV SOLN
INTRAVENOUS | Status: AC
  Filled 2021-06-11: qty 5

## 2021-06-11 MED ORDER — FENTANYL CITRATE (PF) 100 MCG/2ML IJ SOLN
INTRAMUSCULAR | Status: AC
  Filled 2021-06-11: qty 2

## 2021-06-11 MED ORDER — MIDAZOLAM HCL 2 MG/2ML IJ SOLN
INTRAMUSCULAR | Status: AC
  Filled 2021-06-11: qty 2

## 2021-06-11 MED ORDER — FENTANYL CITRATE (PF) 100 MCG/2ML IJ SOLN
INTRAMUSCULAR | Status: DC | PRN
  Administered 2021-06-11: 50 ug via INTRAVENOUS

## 2021-06-11 MED ORDER — SODIUM CHLORIDE 0.9 % IV SOLN: INTRAVENOUS | Status: DC

## 2021-06-11 MED ORDER — MIDAZOLAM HCL 2 MG/2ML IJ SOLN
INTRAMUSCULAR | Status: DC | PRN
  Administered 2021-06-11: 1 mg via INTRAVENOUS

## 2021-06-11 MED ORDER — LIDOCAINE-EPINEPHRINE 1 %-1:100000 IJ SOLN
INTRAMUSCULAR | Status: AC
  Administered 2021-06-11: 20 mL
  Filled 2021-06-11: qty 1

## 2021-06-11 NOTE — H&P (Addendum)
Chief Complaint: Patient was seen in consultation today for Port-A-Cath placement at the request of Sherrill,Gary B  Referring Physician(s): Ladell Pier  Supervising Physician: Sandi Mariscal  Patient Status: [facility] - Out-pt  History of Present Illness: David Jarvis is a 70 y.o. male with PMH of BPV, colon polyps, GERD, impaired glucose, HLD, HTN, melanoma, and recent diagnosis of adenocarcinoma of pancreas head.  MRI 05/18/2021 showed a 2.8 cm mass in the pancreas head.  Patient was referred to IR for Port-A-Cath placement to start chemotherapy on 06/18/2021.  MRI 05/18/21:  IMPRESSION: Increased diffuse biliary and pancreatic ductal dilatation. 2.8 cm hypovascular mass in the pancreatic head, highly suspicious for pancreatic carcinoma.   No evidence of metastatic disease.  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   right temple (MOHS)   BPV (benign positional vertigo)    Colon polyps    GERD (gastroesophageal reflux disease)    Glucose intolerance (impaired glucose tolerance)    History of nuclear stress test    ETT-Myoview 2/18: EF 57, no ischemia, low risk   Hyperlipidemia    Hypertension    Melanoma ([place]) 07/22/2008   RIGHT TEMPLE END STAGE LICHENORO REGRESSION MELANODERMA TX MOHS   Right knee pain    SCC (squamous cell carcinoma) 02/24/2013   left inner forearm ([id number])   Vitreous floaters of both eyes     Past Surgical History:  Procedure Laterality Date   ARTHROSCOPIC REPAIR ACL Right    BILIARY BRUSHING  10/06/2020   Procedure: BILIARY BRUSHING;  Surgeon: Ronnette Juniper, MD;  Location: [facility];  Service: Gastroenterology;;   BILIARY STENT PLACEMENT N/A 05/23/2021   Procedure: BILIARY STENT PLACEMENT;  Surgeon: Ronnette Juniper, MD;  Location: [facility];  Service: Gastroenterology;  Laterality: N/A;   cardiac stress test  08/2008    COLONOSCOPY W/ POLYPECTOMY  2006   ENDOSCOPIC RETROGRADE CHOLANGIOPANCREATOGRAPHY (ERCP) WITH PROPOFOL N/A 05/23/2021   Procedure: ENDOSCOPIC RETROGRADE CHOLANGIOPANCREATOGRAPHY (ERCP) WITH PROPOFOL;  Surgeon: Ronnette Juniper, MD;  Location: [facility];  Service: Gastroenterology;  Laterality: N/A;   ERCP N/A 10/06/2020   Procedure: ENDOSCOPIC RETROGRADE CHOLANGIOPANCREATOGRAPHY (ERCP);  Surgeon: Ronnette Juniper, MD;  Location: [facility];  Service: Gastroenterology;  Laterality: N/A;   EUS N/A 05/23/2021   Procedure: UPPER ENDOSCOPIC ULTRASOUND (EUS) LINEAR;  Surgeon: Arta Silence, MD;  Location: [facility];  Service: Endoscopy;  Laterality: N/A;   FINE NEEDLE ASPIRATION N/A 05/23/2021   Procedure: FINE NEEDLE ASPIRATION (FNA) LINEAR;  Surgeon: Arta Silence, MD;  Location: [facility];  Service: Endoscopy;  Laterality: N/A;   I & D EXTREMITY Left 02/03/2020   Procedure: IRRIGATION AND DEBRIDEMENT EXTREMITY;  Surgeon: Dayna Barker, MD;  Location: [facility];  Service: Plastics;  Laterality: Left;   laser surgery of the eye     SPHINCTEROTOMY  10/06/2020   Procedure: SPHINCTEROTOMY;  Surgeon: Ronnette Juniper, MD;  Location: [facility];  Service: Gastroenterology;;  balloon sweep    Allergies: Lisinopril  Medications: Prior to Admission medications   Medication Sig Start Date End Date Taking? Authorizing Provider  aspirin EC 81 MG tablet Take 81 mg by mouth daily.   Yes [provider]  atorvastatin (LIPITOR) 10 MG tablet Take 10 mg by mouth daily.   Yes [provider]  levothyroxine (SYNTHROID) 25 MCG tablet Take 25 mcg by mouth every morning. 03/30/21  Yes [provider]  pantoprazole (PROTONIX) 40 MG tablet  Take 40 mg by mouth daily.   Yes [provider]  tamsulosin (FLOMAX) 0.4 MG CAPS capsule Take 0.4 mg by mouth daily. 11/30/19  Yes [provider]  valsartan-hydrochlorothiazide (DIOVAN-HCT) 80-12.5 MG tablet Take 0.5 tablets by mouth daily. 03/08/21   Yes [provider]  amLODipine (NORVASC) 5 MG tablet Take 1 tablet (5 mg total) by mouth daily. Patient not taking: No sig reported 10/06/20   Eugenie Filler, MD  lidocaine-prilocaine (EMLA) cream Apply 1 application topically as needed. 06/06/21   Ladell Pier, MD  ondansetron (ZOFRAN) 8 MG tablet Take 1 tablet (8 mg total) by mouth 2 (two) times daily. 06/06/21   Ladell Pier, MD  prochlorperazine (COMPAZINE) 10 MG tablet Take 1 tablet (10 mg total) by mouth every 6 (six) hours as needed for nausea or vomiting. 06/06/21   Ladell Pier, MD     Family History  Problem Relation Age of Onset   CAD Mother    Diabetes Mother    Heart attack Mother 15   CVA Father    CVA Sister    CAD Brother    Diabetes Brother    Heart attack Brother 72   Cancer Paternal Aunt        unknown type dx. 24s   Breast cancer Cousin        paternal cousin, dx. 65s   Heart failure Neg Hx     Social History   Socioeconomic History   Marital status: Married    Spouse name: Not on file   Number of children: Not on file   Years of education: Not on file   Highest education level: Not on file  Occupational History   Not on file  Tobacco Use   Smoking status: Never   Smokeless tobacco: Never  Vaping Use   Vaping Use: Never used  Substance and Sexual Activity   Alcohol use: Yes    Comment: occasionally   Drug use: No   Sexual activity: Yes    Comment: married  Other Topics Concern   Not on file  Social History Narrative   Accountant   Married.  2 daughters.   Lived in [place] for many years.  Born in [place].   [employer]; [place].   Social Determinants of Health   Financial Resource Strain: Not on file  Food Insecurity: Not on file  Transportation Needs: Not on file  Physical Activity: Not on file  Stress: Not on file  Social Connections: Not on file      Review of Systems: A 12 point ROS discussed and pertinent positives are  indicated in the HPI above.  All other systems are negative.  Review of Systems  Constitutional:  Negative for chills, fatigue and fever.  HENT:  Negative for nosebleeds.   Respiratory:  Negative for cough and shortness of breath.   Cardiovascular:  Negative for chest pain and leg swelling.  Gastrointestinal:  Positive for abdominal pain. Negative for blood in stool, diarrhea, nausea and vomiting.  Neurological:  Negative for light-headedness and headaches.   Vital Signs: BP 108/80 (BP Location: Right Arm)   Pulse 64   Temp 97.8 F (36.6 C) (Oral)   Ht 5\' 6"  (1.676 m)   Wt 155 lb (70.3 kg)   SpO2 99%   BMI 25.02 kg/m   Physical Exam Constitutional:      Appearance: Normal appearance. He is not ill-appearing.  HENT:     Head: Normocephalic and atraumatic.  Mouth/Throat:     Mouth: Mucous membranes are dry.     Pharynx: Oropharynx is clear.  Cardiovascular:     Rate and Rhythm: Normal rate and regular rhythm.     Pulses: Normal pulses.     Heart sounds: No murmur heard.   No gallop.  Pulmonary:     Effort: Pulmonary effort is normal. No respiratory distress.     Breath sounds: Normal breath sounds. No stridor. No wheezing, rhonchi or rales.  Abdominal:     General: Abdomen is flat. Bowel sounds are normal. There is no distension.     Palpations: Abdomen is soft.     Tenderness: There is no abdominal tenderness. There is no guarding.  Musculoskeletal:     Right lower leg: No edema.     Left lower leg: No edema.  Skin:    General: Skin is warm and dry.  Neurological:     Mental Status: He is alert and oriented to person, place, and time.  Psychiatric:        Mood and Affect: Mood normal.        Behavior: Behavior normal.        Thought Content: Thought content normal.        Judgment: Judgment normal.    Imaging: CT CHEST W CONTRAST  Result Date: 06/02/2021 CLINICAL DATA:  Staging of pancreatic cancer found on recent imaging EXAM: CT ABDOMEN WITHOUT AND CHEST  AND ABDOMEN WITH INTRAVENOUS CONTRAST TECHNIQUE: Multidetector CT imaging of the abdomen was first performed without contrast, subsequently CT imaging of the chest and abdomen was performed following the standard protocol during bolus administration of intravenous contrast. CONTRAST:  154mL OMNIPAQUE IOHEXOL 350 MG/ML SOLN COMPARISON:  Exams from September and February of 2022. FINDINGS: CT CHEST FINDINGS Cardiovascular: Calcified atheromatous plaque of the thoracic aorta. Three-vessel branching pattern 4.1 cm caliber of the ascending thoracic aorta. Heart size mildly enlarged, three-vessel coronary artery disease. No substantial pericardial effusion. Central pulmonary vasculature unremarkable. Mediastinum/Nodes: Esophagus grossly normal. No sign of adenopathy in the chest. Lungs/Pleura: Airways are patent. Lungs are clear. No signs of pulmonary metastatic disease at this time. Musculoskeletal: See below for full musculoskeletal details. CT ABDOMEN PELVIS FINDINGS Hepatobiliary: Pneumobilia post biliary stent placement. Tiny hypodensity in the LEFT hemi liver measures 5 mm, likely a cyst. No focal, suspicious hepatic lesion. Portal vein is patent. Hepatic arterial supply is derive from the celiac axis and classic fashion, no signs of variant hepatic arterial anatomy from SMA or celiac. Pancreas: Hypoenhancing mass in the pancreatic head 3.2 x 2.3 cm. Cystic area and inflammatory changes along the anterior pancreas in the pancreatic/duodenal groove (image 71/13 4.6 x 1.5 cm, not seen on the MRCP of May 18, 2021. Signs of peripheral pancreatic atrophy and ductal dilation in keeping with pancreatic duct obstruction in the setting of pancreatic adenocarcinoma. Some stranding about the pancreas is more likely related to inflammatory changes. No sign of vascular encasement. Some hazy stranding context the portal and SMV. No substantial collateral pathways in the upper abdomen. Unremarkable. 1 cm LEFT retroperitoneal  lymph node below the LEFT renal vein (image 87/13) suspicious for nodal metastasis. This was present on the previous imaging evaluation. No retrocrural nodes. No hepatic gastric lymph nodes. Spleen: Unremarkable. Adrenals/Urinary Tract: Adrenal glands are normal. Kidneys enhance symmetrically. No hydronephrosis. No suspicious renal lesion. Stomach/Bowel: Some duodenal thickening with presumed inflammation about the pancreatic head. Tumor in the uncinate process is inseparable from the duodenal wall on coronal images. Visualized gastrointestinal  tract otherwise unremarkable. Vascular/Lymphatic: Vascular structures as described. No aneurysmal dilation of the abdominal aorta with atherosclerotic plaque in the abdominal aorta. LEFT retroperitoneal lymph node as described. Other: No ascites Musculoskeletal: Unremarkable aside from degenerative changes. IMPRESSION: Hypoenhancing mass in the pancreatic head/uncinate measuring 3.2 x 2.3 cm, consistent with pancreatic adenocarcinoma. Mass is inseparable from the third portion of the duodenum. LEFT retroperitoneal lymph node is suspicious for nodal metastasis. No gross vascular involvement. Post inflammatory changes following ERCP about the pancreatic head, attention on follow-up. No evidence of metastatic disease in the chest. Pneumobilia post biliary stent placement. Aortic Atherosclerosis (ICD10-I70.0). 4.1 cm ascending thoracic aortic dilation. Recommend annual imaging followup by CTA or MRA. This recommendation follows 2010 ACCF/AHA/AATS/ACR/ASA/SCA/SCAI/SIR/STS/SVM Guidelines for the Diagnosis and Management of Patients with Thoracic Aortic Disease. Circulation. 2010; 121: R427-C623. Aortic aneurysm NOS (ICD10-I71.9) Three-vessel coronary artery disease. Electronically Signed   By: Zetta Bills M.D.   On: 06/02/2021 16:35   DG ERCP  Result Date: 05/23/2021 CLINICAL DATA:  Elevated LFTs, biliary ductal dilatation EXAM: ERCP TECHNIQUE: Multiple spot images obtained  with the fluoroscopic device and submitted for interpretation post-procedure. FLUOROSCOPY TIME:  Fluoroscopy Time:  1 minute 46 seconds Number of Acquired Spot Images: 0 COMPARISON:  MRCP 05/18/2021 FINDINGS: A total of 10 intraoperative saved images are submitted for review. The images demonstrate a flexible duodenal scope in the descending duodenum with wire cannulation of the common bile duct. Subsequent cholangiogram demonstrates diffuse biliary ductal dilatation with occlusion of the distal common bile duct. On the subsequent images, a self expanding metallic biliary stent has been placed. IMPRESSION: 1. Distal biliary duct occlusion with associated ductal dilatation proximally. 2. Placement of a self expanding metal biliary stent. These images were submitted for radiologic interpretation only. Please see the procedural report for the amount of contrast and the fluoroscopy time utilized. Electronically Signed   By: Jacqulynn Cadet M.D.   On: 05/23/2021 13:21   MR ABDOMEN MRCP W WO CONTAST  Result Date: 05/18/2021 CLINICAL DATA:  Elevated liver function tests. Biliary ductal dilatation. EXAM: MRI ABDOMEN WITHOUT AND WITH CONTRAST (INCLUDING MRCP) TECHNIQUE: Multiplanar multisequence MR imaging of the abdomen was performed both before and after the administration of intravenous contrast. Heavily T2-weighted images of the biliary and pancreatic ducts were obtained, and three-dimensional MRCP images were rendered by post processing. CONTRAST:  20mL MULTIHANCE GADOBENATE DIMEGLUMINE 529 MG/ML IV SOLN COMPARISON:  10/05/2020 FINDINGS: Lower chest: No acute findings. Hepatobiliary: No hepatic masses identified. Increased diffuse biliary ductal dilatation is seen as well as distension of the gallbladder. No evidence of cholecystitis. Common bile duct measures 18 mm in diameter, compared to 15 mm previously. Abrupt stricture of the distal common bile duct is seen in the pancreatic head. No evidence of  choledocholithiasis. Pancreas: Mild increase in diffuse pancreatic ductal dilatation is seen. A bilobed hypovascular mass is now seen in the pancreatic head which measures 2.8 x 1.6 cm. This is highly suspicious for pancreatic carcinoma. This mass shows no evidence of vascular involvement. Diffuse pancreatic atrophy shows mild progression since prior study. Spleen:  Within normal limits in size and appearance. Adrenals/Urinary Tract: No masses identified. No evidence of hydronephrosis. Stomach/Bowel: Visualized portion unremarkable. Vascular/Lymphatic: No pathologically enlarged lymph nodes identified. No acute vascular findings. Other:  None. Musculoskeletal:  No suspicious bone lesions identified. IMPRESSION: Increased diffuse biliary and pancreatic ductal dilatation. 2.8 cm hypovascular mass in the pancreatic head, highly suspicious for pancreatic carcinoma. No evidence of metastatic disease. Electronically Signed   By: Jenny Reichmann  Tereso Newcomer M.D.   On: 05/18/2021 13:44   CT PANCREAS ABD W/WO  Result Date: 06/02/2021 CLINICAL DATA:  Staging of pancreatic cancer found on recent imaging EXAM: CT ABDOMEN WITHOUT AND CHEST AND ABDOMEN WITH INTRAVENOUS CONTRAST TECHNIQUE: Multidetector CT imaging of the abdomen was first performed without contrast, subsequently CT imaging of the chest and abdomen was performed following the standard protocol during bolus administration of intravenous contrast. CONTRAST:  170mL OMNIPAQUE IOHEXOL 350 MG/ML SOLN COMPARISON:  Exams from September and February of 2022. FINDINGS: CT CHEST FINDINGS Cardiovascular: Calcified atheromatous plaque of the thoracic aorta. Three-vessel branching pattern 4.1 cm caliber of the ascending thoracic aorta. Heart size mildly enlarged, three-vessel coronary artery disease. No substantial pericardial effusion. Central pulmonary vasculature unremarkable. Mediastinum/Nodes: Esophagus grossly normal. No sign of adenopathy in the chest. Lungs/Pleura: Airways are  patent. Lungs are clear. No signs of pulmonary metastatic disease at this time. Musculoskeletal: See below for full musculoskeletal details. CT ABDOMEN PELVIS FINDINGS Hepatobiliary: Pneumobilia post biliary stent placement. Tiny hypodensity in the LEFT hemi liver measures 5 mm, likely a cyst. No focal, suspicious hepatic lesion. Portal vein is patent. Hepatic arterial supply is derive from the celiac axis and classic fashion, no signs of variant hepatic arterial anatomy from SMA or celiac. Pancreas: Hypoenhancing mass in the pancreatic head 3.2 x 2.3 cm. Cystic area and inflammatory changes along the anterior pancreas in the pancreatic/duodenal groove (image 71/13 4.6 x 1.5 cm, not seen on the MRCP of May 18, 2021. Signs of peripheral pancreatic atrophy and ductal dilation in keeping with pancreatic duct obstruction in the setting of pancreatic adenocarcinoma. Some stranding about the pancreas is more likely related to inflammatory changes. No sign of vascular encasement. Some hazy stranding context the portal and SMV. No substantial collateral pathways in the upper abdomen. Unremarkable. 1 cm LEFT retroperitoneal lymph node below the LEFT renal vein (image 87/13) suspicious for nodal metastasis. This was present on the previous imaging evaluation. No retrocrural nodes. No hepatic gastric lymph nodes. Spleen: Unremarkable. Adrenals/Urinary Tract: Adrenal glands are normal. Kidneys enhance symmetrically. No hydronephrosis. No suspicious renal lesion. Stomach/Bowel: Some duodenal thickening with presumed inflammation about the pancreatic head. Tumor in the uncinate process is inseparable from the duodenal wall on coronal images. Visualized gastrointestinal tract otherwise unremarkable. Vascular/Lymphatic: Vascular structures as described. No aneurysmal dilation of the abdominal aorta with atherosclerotic plaque in the abdominal aorta. LEFT retroperitoneal lymph node as described. Other: No ascites  Musculoskeletal: Unremarkable aside from degenerative changes. IMPRESSION: Hypoenhancing mass in the pancreatic head/uncinate measuring 3.2 x 2.3 cm, consistent with pancreatic adenocarcinoma. Mass is inseparable from the third portion of the duodenum. LEFT retroperitoneal lymph node is suspicious for nodal metastasis. No gross vascular involvement. Post inflammatory changes following ERCP about the pancreatic head, attention on follow-up. No evidence of metastatic disease in the chest. Pneumobilia post biliary stent placement. Aortic Atherosclerosis (ICD10-I70.0). 4.1 cm ascending thoracic aortic dilation. Recommend annual imaging followup by CTA or MRA. This recommendation follows 2010 ACCF/AHA/AATS/ACR/ASA/SCA/SCAI/SIR/STS/SVM Guidelines for the Diagnosis and Management of Patients with Thoracic Aortic Disease. Circulation. 2010; 121: Z610-R604. Aortic aneurysm NOS (ICD10-I71.9) Three-vessel coronary artery disease. Electronically Signed   By: Zetta Bills M.D.   On: 06/02/2021 16:35    Labs:  CBC: Recent Labs    10/05/20 1145 10/06/20 0452  WBC 2.9* 4.0  HGB 13.5 12.5*  HCT 41.0 38.1*  PLT 147* 151    COAGS: No results for input(s): INR, APTT in the last 8760 hours.  BMP: Recent Labs  10/05/20 1145 10/06/20 0452 06/01/21 0903  NA 139 141  --   K 4.1 4.2  --   CL 106 107  --   CO2 24 25  --   GLUCOSE 111* 107*  --   BUN 15 10  --   CALCIUM 8.8* 8.6*  --   CREATININE 1.19 1.16 1.10  GFRNONAA >60 >60  --     LIVER FUNCTION TESTS: Recent Labs    10/05/20 1145 10/06/20 0452  BILITOT 1.2 1.2  AST 152* 170*  ALT 218* 216*  ALKPHOS 331* 343*  PROT 6.8 6.2*  ALBUMIN 3.9 3.5    TUMOR MARKERS: No results for input(s): AFPTM, CEA, CA199, CHROMGRNA in the last 8760 hours.  Assessment and Plan: History of BPV, colon polyps, GERD, impaired glucose, HLD, HTN, melanoma, and recent diagnosis of adenocarcinoma of pancreas head.  MRI 05/18/2021 showed a 2.8 cm mass in the  pancreas head.  Patient was referred to IR for Port-A-Cath placement to start chemotherapy on 06/18/2021.  Pt resting quietly in bed. He is A&O, calm and pleasant. He is in NAD.  Pt reports he is NPO per order.  He does not take thinning medication. No labs needed today. VSS  Risks and benefits of image guided port-a-catheter placement was discussed with the patient including, but not limited to bleeding, infection, pneumothorax, or fibrin sheath development and need for additional procedures.  All of the patient's questions were answered, patient is agreeable to proceed. Consent signed and in chart.   Thank you for this interesting consult.  I greatly enjoyed meeting David Jarvis and look forward to participating in their care.  A copy of this report was sent to the requesting provider on this date.  Electronically Signed: Tyson Alias, NP 06/11/2021, 8:01 AM   I spent a total of 30 minutes in face to face in clinical consultation, greater than 50% of which was counseling/coordinating care for image guided Port-A-Cath placement.

## 2021-06-11 NOTE — Procedures (Signed)
Pre Procedure Dx: Poor venous access Post Procedural Dx: Same  Successful placement of right IJ approach port-a-cath with tip at the superior caval atrial junction. The catheter is ready for immediate use.  Estimated Blood Loss: Minimal  Complications: None immediate.  Jay Ruberta Holck, MD Pager #: [phone]   

## 2021-06-11 NOTE — Progress Notes (Signed)
Patient was given discharge instructions. He verbalized understanding. 

## 2021-06-12 DIAGNOSIS — C250 Malignant neoplasm of head of pancreas: Secondary | ICD-10-CM | POA: Diagnosis not present

## 2021-06-12 DIAGNOSIS — I2510 Atherosclerotic heart disease of native coronary artery without angina pectoris: Secondary | ICD-10-CM | POA: Diagnosis not present

## 2021-06-15 ENCOUNTER — Telehealth: Payer: Self-pay

## 2021-06-15 ENCOUNTER — Other Ambulatory Visit (HOSPITAL_BASED_OUTPATIENT_CLINIC_OR_DEPARTMENT_OTHER): Payer: Self-pay

## 2021-06-15 NOTE — Telephone Encounter (Signed)
Prior Authorization Approved for Lidocaine Cream (Emla Cream) with authorization date being 09/02/20-09/12/21. Pharmacy Notified of approval ([facility])[phone]

## 2021-06-17 ENCOUNTER — Other Ambulatory Visit: Payer: Self-pay | Admitting: Oncology

## 2021-06-18 ENCOUNTER — Telehealth: Payer: Self-pay | Admitting: *Deleted

## 2021-06-19 ENCOUNTER — Inpatient Hospital Stay: Payer: Medicare HMO

## 2021-06-19 ENCOUNTER — Telehealth: Payer: Self-pay

## 2021-06-19 ENCOUNTER — Inpatient Hospital Stay (HOSPITAL_BASED_OUTPATIENT_CLINIC_OR_DEPARTMENT_OTHER): Payer: Medicare HMO | Admitting: Oncology

## 2021-06-19 ENCOUNTER — Encounter: Payer: Self-pay | Admitting: *Deleted

## 2021-06-19 ENCOUNTER — Other Ambulatory Visit: Payer: Self-pay

## 2021-06-19 VITALS — BP 114/78 | HR 77 | Temp 98.1°F | Resp 18 | Ht 66.0 in | Wt 155.4 lb

## 2021-06-19 DIAGNOSIS — Z79899 Other long term (current) drug therapy: Secondary | ICD-10-CM | POA: Diagnosis not present

## 2021-06-19 DIAGNOSIS — Z5111 Encounter for antineoplastic chemotherapy: Secondary | ICD-10-CM | POA: Diagnosis not present

## 2021-06-19 DIAGNOSIS — R748 Abnormal levels of other serum enzymes: Secondary | ICD-10-CM | POA: Diagnosis not present

## 2021-06-19 DIAGNOSIS — C250 Malignant neoplasm of head of pancreas: Secondary | ICD-10-CM

## 2021-06-19 DIAGNOSIS — E785 Hyperlipidemia, unspecified: Secondary | ICD-10-CM | POA: Diagnosis not present

## 2021-06-19 DIAGNOSIS — K831 Obstruction of bile duct: Secondary | ICD-10-CM | POA: Diagnosis not present

## 2021-06-19 DIAGNOSIS — I10 Essential (primary) hypertension: Secondary | ICD-10-CM | POA: Diagnosis not present

## 2021-06-19 LAB — CBC WITH DIFFERENTIAL (CANCER CENTER ONLY)
Abs Immature Granulocytes: 0.01 10*3/uL (ref 0.00–0.07)
Basophils Absolute: 0 10*3/uL (ref 0.0–0.1)
Basophils Relative: 0 %
Eosinophils Absolute: 0.1 10*3/uL (ref 0.0–0.5)
Eosinophils Relative: 1 %
HCT: 37.3 % — ABNORMAL LOW (ref 39.0–52.0)
Hemoglobin: 12.8 g/dL — ABNORMAL LOW (ref 13.0–17.0)
Immature Granulocytes: 0 %
Lymphocytes Relative: 33 %
Lymphs Abs: 1.9 10*3/uL (ref 0.7–4.0)
MCH: 28.6 pg (ref 26.0–34.0)
MCHC: 34.3 g/dL (ref 30.0–36.0)
MCV: 83.3 fL (ref 80.0–100.0)
Monocytes Absolute: 0.7 10*3/uL (ref 0.1–1.0)
Monocytes Relative: 11 %
Neutro Abs: 3.2 10*3/uL (ref 1.7–7.7)
Neutrophils Relative %: 55 %
Platelet Count: 172 10*3/uL (ref 150–400)
RBC: 4.48 MIL/uL (ref 4.22–5.81)
RDW: 16.9 % — ABNORMAL HIGH (ref 11.5–15.5)
WBC Count: 5.9 10*3/uL (ref 4.0–10.5)
nRBC: 0 % (ref 0.0–0.2)

## 2021-06-19 LAB — CMP (CANCER CENTER ONLY)
ALT: 20 U/L (ref 0–44)
AST: 21 U/L (ref 15–41)
Albumin: 4 g/dL (ref 3.5–5.0)
Alkaline Phosphatase: 90 U/L (ref 38–126)
Anion gap: 9 (ref 5–15)
BUN: 16 mg/dL (ref 8–23)
CO2: 25 mmol/L (ref 22–32)
Calcium: 9 mg/dL (ref 8.9–10.3)
Chloride: 102 mmol/L (ref 98–111)
Creatinine: 1.01 mg/dL (ref 0.61–1.24)
GFR, Estimated: >60 mL/min (ref >60)
Glucose, Bld: 196 mg/dL — ABNORMAL HIGH (ref 70–99)
Potassium: 3.7 mmol/L (ref 3.5–5.1)
Sodium: 136 mmol/L (ref 135–145)
Total Bilirubin: 1 mg/dL (ref 0.3–1.2)
Total Protein: 6.4 g/dL — ABNORMAL LOW (ref 6.5–8.1)

## 2021-06-19 MED ORDER — OXALIPLATIN CHEMO INJECTION 100 MG/20ML
85.0000 mg/m2 | Freq: Once | INTRAVENOUS | Status: AC
  Administered 2021-06-19: 155 mg via INTRAVENOUS
  Filled 2021-06-19: qty 31

## 2021-06-19 MED ORDER — SODIUM CHLORIDE 0.9 % IV SOLN
150.0000 mg | Freq: Once | INTRAVENOUS | Status: AC
  Administered 2021-06-19: 150 mg via INTRAVENOUS
  Filled 2021-06-19: qty 5

## 2021-06-19 MED ORDER — FLUOROURACIL CHEMO INJECTION 5 GM/100ML
2400.0000 mg/m2 | INTRAVENOUS | Status: DC
  Administered 2021-06-19: 4350 mg via INTRAVENOUS
  Filled 2021-06-19: qty 87

## 2021-06-19 MED ORDER — SODIUM CHLORIDE 0.9 % IV SOLN
400.0000 mg/m2 | Freq: Once | INTRAVENOUS | Status: AC
  Administered 2021-06-19: 728 mg via INTRAVENOUS
  Filled 2021-06-19: qty 36.4

## 2021-06-19 MED ORDER — ATROPINE SULFATE 1 MG/ML IV SOLN
INTRAVENOUS | Status: AC
  Administered 2021-06-19: 0.5 mg via INTRAVENOUS
  Filled 2021-06-19: qty 1

## 2021-06-19 MED ORDER — DEXTROSE 5 % IV SOLN: Freq: Once | INTRAVENOUS | Status: AC

## 2021-06-19 MED ORDER — SODIUM CHLORIDE 0.9 % IV SOLN
10.0000 mg | Freq: Once | INTRAVENOUS | Status: AC
  Administered 2021-06-19: 10 mg via INTRAVENOUS
  Filled 2021-06-19: qty 1

## 2021-06-19 MED ORDER — SODIUM CHLORIDE 0.9 % IV SOLN
150.0000 mg/m2 | Freq: Once | INTRAVENOUS | Status: AC
  Administered 2021-06-19: 280 mg via INTRAVENOUS
  Filled 2021-06-19: qty 14

## 2021-06-19 MED ORDER — PALONOSETRON HCL INJECTION 0.25 MG/5ML
0.2500 mg | Freq: Once | INTRAVENOUS | Status: AC
  Administered 2021-06-19: 0.25 mg via INTRAVENOUS

## 2021-06-19 MED ORDER — ATROPINE SULFATE 1 MG/ML IV SOLN: 0.5000 mg | Freq: Once | INTRAVENOUS | Status: AC | PRN

## 2021-06-19 NOTE — Progress Notes (Signed)
Error

## 2021-06-19 NOTE — Progress Notes (Signed)
  [place] OFFICE PROGRESS NOTE   Diagnosis: Pancreas cancer  INTERVAL HISTORY:   David Jarvis returns as scheduled.  He is to begin FOLFIRINOX. He was seen at [facility] on 06/12/2021.  He was evaluated by surgery and medical oncology.  I discussed the case with Dr.Abbruzzese.  He recommends proceeding with neoadjuvant FOLFIRINOX to be followed by restaging and then surgery.  He was evaluated by Dr. Mariah Milling on 06/12/2021. David Jarvis reports abdominal discomfort and an episode of diarrhea over the past several days.  He has pain at the low back, worse at night.  The pain is relieved with Tylenol. Objective:  Vital signs in last 24 hours:  Blood pressure 114/78, pulse 77, temperature 98.1 F (36.7 C), temperature source Oral, resp. rate 18, height 5\' 6"  (1.676 m), weight 155 lb 6.4 oz (70.5 kg), SpO2 97 %.    Resp: Lungs clear bilaterally Cardio: Regular rate and rhythm GI: No hepatosplenomegaly, nontender, no mass Vascular: No leg edema   Portacath/PICC-without erythema  Lab Results:  Lab Results  Component Value Date   WBC 5.9 06/19/2021   HGB 12.8 (L) 06/19/2021   HCT 37.3 (L) 06/19/2021   MCV 83.3 06/19/2021   PLT 172 06/19/2021   NEUTROABS 3.2 06/19/2021    CMP  Lab Results  Component Value Date   NA 141 10/06/2020   K 4.2 10/06/2020   CL 107 10/06/2020   CO2 25 10/06/2020   GLUCOSE 107 (H) 10/06/2020   BUN 10 10/06/2020   CREATININE 1.10 06/01/2021   CALCIUM 8.6 (L) 10/06/2020   PROT 6.2 (L) 10/06/2020   ALBUMIN 3.5 10/06/2020   AST 170 (H) 10/06/2020   ALT 216 (H) 10/06/2020   ALKPHOS 343 (H) 10/06/2020   BILITOT 1.2 10/06/2020   GFRNONAA >60 10/06/2020     Medications: I have reviewed the patient's current medications.   Assessment/Plan: Pancreas cancer, adenocarcinoma MRI/MRCP abdomen 05/18/2021-diffuse biliary and pancreatic duct dilation, 2.8 cm mass in the pancreas head, no evidence of metastatic disease ERCP 05/23/2021-localized  stricture in the lower third of the main bile duct, stent placed EUS 05/23/2021-25 x 15 mm pancreas head mass with irregular borders, no lymphadenopathy, no vascular involvement, T2 N0 by ultrasound CT chest 06/01/2021-negative for metastatic disease CT abdomen pancreas protocol 06/01/2021-3.2 x 2.3 cm hypoenhancing pancreas head mass inseparable from the third portion of the duodenum, 1 cm left retroperitoneal lymph node, no vascular involvement Cycle 1 FOLFIRINOX 06/19/2021 Obstructive jaundice secondary #1 Hypertension Admission in February 2022 with elevated liver enzymes MRI/MRCP-intra and extrahepatic biliary duct dilation, tapering smoothly to the ampulla, no mass or filling defect ERCP 10/06/2020-dilated main bile duct, biliary tree was swept and nothing was found, cytology from bile duct brushing-no malignant cells identified 5.  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  GERD 7.  Squamous cell carcinoma left forearm 2014 8.  Hyperlipidemia     Disposition: David. Jarvis appears stable.  The plan is to begin neoadjuvant FOLFIRINOX today.  He has been seen in consultation by medical oncology, surgery, and radiation oncology at [facility].  He will undergo restaging CTs after 6 cycles of FOLFIRINOX.  David. Jarvis will return for an office visit and chemotherapy in 2 weeks.  He will call for increased back pain.  He declined a narcotic analgesic today.  Betsy Coder, MD  06/19/2021  8:21 AM

## 2021-06-19 NOTE — Telephone Encounter (Signed)
Patient seen by Dr. Sherrill today ? ?Vitals are within treatment parameters. ? ?Labs reviewed by Dr. Sherrill and are within treatment parameters. ? ?Per physician team, patient is ready for treatment and there are NO modifications to the treatment plan.  ?

## 2021-06-19 NOTE — Progress Notes (Signed)
Pharmacist Chemotherapy Monitoring - Initial Assessment    Anticipated start date: 10/18   The following has been reviewed per standard work regarding the patient's treatment regimen: The patient's diagnosis, treatment plan and drug doses, and organ/hematologic function Lab orders and baseline tests specific to treatment regimen  The treatment plan start date, drug sequencing, and pre-medications Prior authorization status  Patient's documented medication list, including drug-drug interaction screen and prescriptions for anti-emetics and supportive care specific to the treatment regimen The drug concentrations, fluid compatibility, administration routes, and timing of the medications to be used The patient's access for treatment and lifetime cumulative dose history, if applicable  The patient's medication allergies and previous infusion related reactions, if applicable   Changes made to treatment plan:  N/A  Follow up needed:  N/A   Liz Beach, RPH, 06/19/2021  9:35 AM

## 2021-06-19 NOTE — Progress Notes (Signed)
PATIENT NAVIGATOR PROGRESS NOTE  Name: David Jarvis Date: 06/19/2021 MRN: [id number]  DOB: 03-25-1951   Reason for visit:  First chemo treatment  Comments:  Met with Mr Pape during his first treatment and discussed management of side effects. Encouraged him to call with any questions or issues    Time spent counseling/coordinating care: 15-30 minutes

## 2021-06-19 NOTE — Patient Instructions (Signed)
[facility]  Discharge Instructions: Thank you for choosing [facility] to provide your oncology and hematology care.   If you have a lab appointment with the [facility], please go directly to the [facility] and check in at the registration area.   Wear comfortable clothing and clothing appropriate for easy access to any Portacath or PICC line.   We strive to give you quality time with your provider. You may need to reschedule your appointment if you arrive late (15 or more minutes).  Arriving late affects you and other patients whose appointments are after yours.  Also, if you miss three or more appointments without notifying the office, you may be dismissed from the clinic at the provider's discretion.      For prescription refill requests, have your pharmacy contact our office and allow 72 hours for refills to be completed.    Today you received the following chemotherapy and/or immunotherapy agents oxaliplatin, leucovorin, irinotecan, 5 FU      To help prevent nausea and vomiting after your treatment, we encourage you to take your nausea medication as directed.  BELOW ARE SYMPTOMS THAT SHOULD BE REPORTED IMMEDIATELY: *FEVER GREATER THAN 100.4 F (38 C) OR HIGHER *CHILLS OR SWEATING *NAUSEA AND VOMITING THAT IS NOT CONTROLLED WITH YOUR NAUSEA MEDICATION *UNUSUAL SHORTNESS OF BREATH *UNUSUAL BRUISING OR BLEEDING *URINARY PROBLEMS (pain or burning when urinating, or frequent urination) *BOWEL PROBLEMS (unusual diarrhea, constipation, pain near the anus) TENDERNESS IN MOUTH AND THROAT WITH OR WITHOUT PRESENCE OF ULCERS (sore throat, sores in mouth, or a toothache) UNUSUAL RASH, SWELLING OR PAIN  UNUSUAL VAGINAL DISCHARGE OR ITCHING   Items with * indicate a potential emergency and should be followed up as soon as possible or go to the Emergency Department if any problems should occur.  Please show the CHEMOTHERAPY ALERT CARD or  IMMUNOTHERAPY ALERT CARD at check-in to the Emergency Department and triage nurse.  Should you have questions after your visit or need to cancel or reschedule your appointment, please contact [facility]  Dept: [phone]  and follow the prompts.  Office hours are 8:00 a.m. to 4:30 p.m. Monday - Friday. Please note that voicemails left after 4:00 p.m. may not be returned until the following business day.  We are closed weekends and major holidays. You have access to a nurse at all times for urgent questions. Please call the main number to the clinic Dept: [phone] and follow the prompts.   For any non-urgent questions, you may also contact your provider using MyChart. We now offer e-Visits for anyone 65 and older to request care online for non-urgent symptoms. For details visit [email or web address].   Also download the MyChart app! Go to the app store, search "MyChart", open the app, select [facility], and log in with your MyChart username and password.  Due to Covid, a mask is required upon entering the hospital/clinic. If you do not have a mask, one will be given to you upon arrival. For doctor visits, patients may have 1 support person aged 71 or older with them. For treatment visits, patients cannot have anyone with them due to current Covid guidelines and our immunocompromised population.   Oxaliplatin Injection What is this medication? OXALIPLATIN (ox AL i PLA tin) is a chemotherapy drug. It targets fast dividing cells, like cancer cells, and causes these cells to die. This medicine is used to treat cancers of the colon and rectum, and many other  cancers. This medicine may be used for other purposes; ask your health care provider or pharmacist if you have questions. COMMON BRAND NAME(S): Eloxatin What should I tell my care team before I take this medication? They need to know if you have any of these conditions: heart disease history of irregular  heartbeat liver disease low blood counts, like white cells, platelets, or red blood cells lung or breathing disease, like asthma take medicines that treat or prevent blood clots tingling of the fingers or toes, or other nerve disorder an unusual or allergic reaction to oxaliplatin, other chemotherapy, other medicines, foods, dyes, or preservatives pregnant or trying to get pregnant breast-feeding How should I use this medication? This drug is given as an infusion into a vein. It is administered in a hospital or clinic by a specially trained health care professional. Talk to your pediatrician regarding the use of this medicine in children. Special care may be needed. Overdosage: If you think you have taken too much of this medicine contact a poison control center or emergency room at once. NOTE: This medicine is only for you. Do not share this medicine with others. What if I miss a dose? It is important not to miss a dose. Call your doctor or health care professional if you are unable to keep an appointment. What may interact with this medication? Do not take this medicine with any of the following medications: cisapride dronedarone pimozide thioridazine This medicine may also interact with the following medications: aspirin and aspirin-like medicines certain medicines that treat or prevent blood clots like warfarin, apixaban, dabigatran, and rivaroxaban cisplatin cyclosporine diuretics medicines for infection like acyclovir, adefovir, amphotericin B, bacitracin, cidofovir, foscarnet, ganciclovir, gentamicin, pentamidine, vancomycin NSAIDs, medicines for pain and inflammation, like ibuprofen or naproxen other medicines that prolong the QT interval (an abnormal heart rhythm) pamidronate zoledronic acid This list may not describe all possible interactions. Give your health care provider a list of all the medicines, herbs, non-prescription drugs, or dietary supplements you use. Also tell  them if you smoke, drink alcohol, or use illegal drugs. Some items may interact with your medicine. What should I watch for while using this medication? Your condition will be monitored carefully while you are receiving this medicine. You may need blood work done while you are taking this medicine. This medicine may make you feel generally unwell. This is not uncommon as chemotherapy can affect healthy cells as well as cancer cells. Report any side effects. Continue your course of treatment even though you feel ill unless your healthcare professional tells you to stop. This medicine can make you more sensitive to cold. Do not drink cold drinks or use ice. Cover exposed skin before coming in contact with cold temperatures or cold objects. When out in cold weather wear warm clothing and cover your mouth and nose to warm the air that goes into your lungs. Tell your doctor if you get sensitive to the cold. Do not become pregnant while taking this medicine or for 9 months after stopping it. Women should inform their health care professional if they wish to become pregnant or think they might be pregnant. Men should not father a child while taking this medicine and for 6 months after stopping it. There is potential for serious side effects to an unborn child. Talk to your health care professional for more information. Do not breast-feed a child while taking this medicine or for 3 months after stopping it. This medicine has caused ovarian failure in some women.  This medicine may make it more difficult to get pregnant. Talk to your health care professional if you are concerned about your fertility. This medicine has caused decreased sperm counts in some men. This may make it more difficult to father a child. Talk to your health care professional if you are concerned about your fertility. This medicine may increase your risk of getting an infection. Call your health care professional for advice if you get a fever,  chills, or sore throat, or other symptoms of a cold or flu. Do not treat yourself. Try to avoid being around people who are sick. Avoid taking medicines that contain aspirin, acetaminophen, ibuprofen, naproxen, or ketoprofen unless instructed by your health care professional. These medicines may hide a fever. Be careful brushing or flossing your teeth or using a toothpick because you may get an infection or bleed more easily. If you have any dental work done, tell your dentist you are receiving this medicine. What side effects may I notice from receiving this medication? Side effects that you should report to your doctor or health care professional as soon as possible: allergic reactions like skin rash, itching or hives, swelling of the face, lips, or tongue breathing problems cough low blood counts - this medicine may decrease the number of white blood cells, red blood cells, and platelets. You may be at increased risk for infections and bleeding nausea, vomiting pain, redness, or irritation at site where injected pain, tingling, numbness in the hands or feet signs and symptoms of bleeding such as bloody or black, tarry stools; red or dark brown urine; spitting up blood or brown material that looks like coffee grounds; red spots on the skin; unusual bruising or bleeding from the eyes, gums, or nose signs and symptoms of a dangerous change in heartbeat or heart rhythm like chest pain; dizziness; fast, irregular heartbeat; palpitations; feeling faint or lightheaded; falls signs and symptoms of infection like fever; chills; cough; sore throat; pain or trouble passing urine signs and symptoms of liver injury like dark yellow or brown urine; general ill feeling or flu-like symptoms; light-colored stools; loss of appetite; nausea; right upper belly pain; unusually weak or tired; yellowing of the eyes or skin signs and symptoms of low red blood cells or anemia such as unusually weak or tired; feeling faint  or lightheaded; falls signs and symptoms of muscle injury like dark urine; trouble passing urine or change in the amount of urine; unusually weak or tired; muscle pain; back pain Side effects that usually do not require medical attention (report to your doctor or health care professional if they continue or are bothersome): changes in taste diarrhea gas hair loss loss of appetite mouth sores This list may not describe all possible side effects. Call your doctor for medical advice about side effects. You may report side effects to FDA at 1-800-FDA-1088. Where should I keep my medication? This drug is given in a hospital or clinic and will not be stored at home. NOTE: This sheet is a summary. It may not cover all possible information. If you have questions about this medicine, talk to your doctor, pharmacist, or health care provider.  2022 Elsevier/Gold Standard (2019-01-06 12:20:35)  Leucovorin injection What is this medication? LEUCOVORIN (loo koe VOR in) is used to prevent or treat the harmful effects of some medicines. This medicine is used to treat anemia caused by a low amount of folic acid in the body. It is also used with 5-fluorouracil (5-FU) to treat colon cancer. This  medicine may be used for other purposes; ask your health care provider or pharmacist if you have questions. What should I tell my care team before I take this medication? They need to know if you have any of these conditions: anemia from low levels of vitamin B-12 in the blood an unusual or allergic reaction to leucovorin, folic acid, other medicines, foods, dyes, or preservatives pregnant or trying to get pregnant breast-feeding How should I use this medication? This medicine is for injection into a muscle or into a vein. It is given by a health care professional in a hospital or clinic setting. Talk to your pediatrician regarding the use of this medicine in children. Special care may be needed. Overdosage: If you  think you have taken too much of this medicine contact a poison control center or emergency room at once. NOTE: This medicine is only for you. Do not share this medicine with others. What if I miss a dose? This does not apply. What may interact with this medication? capecitabine fluorouracil phenobarbital phenytoin primidone trimethoprim-sulfamethoxazole This list may not describe all possible interactions. Give your health care provider a list of all the medicines, herbs, non-prescription drugs, or dietary supplements you use. Also tell them if you smoke, drink alcohol, or use illegal drugs. Some items may interact with your medicine. What should I watch for while using this medication? Your condition will be monitored carefully while you are receiving this medicine. This medicine may increase the side effects of 5-fluorouracil, 5-FU. Tell your doctor or health care professional if you have diarrhea or mouth sores that do not get better or that get worse. What side effects may I notice from receiving this medication? Side effects that you should report to your doctor or health care professional as soon as possible: allergic reactions like skin rash, itching or hives, swelling of the face, lips, or tongue breathing problems fever, infection mouth sores unusual bleeding or bruising unusually weak or tired Side effects that usually do not require medical attention (report to your doctor or health care professional if they continue or are bothersome): constipation or diarrhea loss of appetite nausea, vomiting This list may not describe all possible side effects. Call your doctor for medical advice about side effects. You may report side effects to FDA at 1-800-FDA-1088. Where should I keep my medication? This drug is given in a hospital or clinic and will not be stored at home. NOTE: This sheet is a summary. It may not cover all possible information. If you have questions about this  medicine, talk to your doctor, pharmacist, or health care provider.  2022 Elsevier/Gold Standard (2008-02-23 16:50:29)  Irinotecan injection What is this medication? IRINOTECAN (ir in oh TEE kan ) is a chemotherapy drug. It is used to treat colon and rectal cancer. This medicine may be used for other purposes; ask your health care provider or pharmacist if you have questions. COMMON BRAND NAME(S): Camptosar What should I tell my care team before I take this medication? They need to know if you have any of these conditions: dehydration diarrhea infection (especially a virus infection such as chickenpox, cold sores, or herpes) liver disease low blood counts, like low white cell, platelet, or red cell counts low levels of calcium, magnesium, or potassium in the blood recent or ongoing radiation therapy an unusual or allergic reaction to irinotecan, other medicines, foods, dyes, or preservatives pregnant or trying to get pregnant breast-feeding How should I use this medication? This drug is given as  an infusion into a vein. It is administered in a hospital or clinic by a specially trained health care professional. Talk to your pediatrician regarding the use of this medicine in children. Special care may be needed. Overdosage: If you think you have taken too much of this medicine contact a poison control center or emergency room at once. NOTE: This medicine is only for you. Do not share this medicine with others. What if I miss a dose? It is important not to miss your dose. Call your doctor or health care professional if you are unable to keep an appointment. What may interact with this medication? Do not take this medicine with any of the following medications: cobicistat itraconazole This medicine may interact with the following medications: antiviral medicines for HIV or AIDS certain antibiotics like rifampin or rifabutin certain medicines for fungal infections like ketoconazole,  posaconazole, and voriconazole certain medicines for seizures like carbamazepine, phenobarbital, phenotoin clarithromycin gemfibrozil nefazodone [place]'s Wort This list may not describe all possible interactions. Give your health care provider a list of all the medicines, herbs, non-prescription drugs, or dietary supplements you use. Also tell them if you smoke, drink alcohol, or use illegal drugs. Some items may interact with your medicine. What should I watch for while using this medication? Your condition will be monitored carefully while you are receiving this medicine. You will need important blood work done while you are taking this medicine. This drug may make you feel generally unwell. This is not uncommon, as chemotherapy can affect healthy cells as well as cancer cells. Report any side effects. Continue your course of treatment even though you feel ill unless your doctor tells you to stop. In some cases, you may be given additional medicines to help with side effects. Follow all directions for their use. You may get drowsy or dizzy. Do not drive, use machinery, or do anything that needs mental alertness until you know how this medicine affects you. Do not stand or sit up quickly, especially if you are an older patient. This reduces the risk of dizzy or fainting spells. Call your health care professional for advice if you get a fever, chills, or sore throat, or other symptoms of a cold or flu. Do not treat yourself. This medicine decreases your body's ability to fight infections. Try to avoid being around people who are sick. Avoid taking products that contain aspirin, acetaminophen, ibuprofen, naproxen, or ketoprofen unless instructed by your doctor. These medicines may hide a fever. This medicine may increase your risk to bruise or bleed. Call your doctor or health care professional if you notice any unusual bleeding. Be careful brushing and flossing your teeth or using a toothpick  because you may get an infection or bleed more easily. If you have any dental work done, tell your dentist you are receiving this medicine. Do not become pregnant while taking this medicine or for 6 months after stopping it. Women should inform their health care professional if they wish to become pregnant or think they might be pregnant. Men should not father a child while taking this medicine and for 3 months after stopping it. There is potential for serious side effects to an unborn child. Talk to your health care professional for more information. Do not breast-feed an infant while taking this medicine or for 7 days after stopping it. This medicine has caused ovarian failure in some women. This medicine may make it more difficult to get pregnant. Talk to your health care professional if  you are concerned about your fertility. This medicine has caused decreased sperm counts in some men. This may make it more difficult to father a child. Talk to your health care professional if you are concerned about your fertility. What side effects may I notice from receiving this medication? Side effects that you should report to your doctor or health care professional as soon as possible: allergic reactions like skin rash, itching or hives, swelling of the face, lips, or tongue chest pain diarrhea flushing, runny nose, sweating during infusion low blood counts - this medicine may decrease the number of white blood cells, red blood cells and platelets. You may be at increased risk for infections and bleeding. nausea, vomiting pain, swelling, warmth in the leg signs of decreased platelets or bleeding - bruising, pinpoint red spots on the skin, black, tarry stools, blood in the urine signs of infection - fever or chills, cough, sore throat, pain or difficulty passing urine signs of decreased red blood cells - unusually weak or tired, fainting spells, lightheadedness Side effects that usually do not require  medical attention (report to your doctor or health care professional if they continue or are bothersome): constipation hair loss headache loss of appetite mouth sores stomach pain This list may not describe all possible side effects. Call your doctor for medical advice about side effects. You may report side effects to FDA at 1-800-FDA-1088. Where should I keep my medication? This drug is given in a hospital or clinic and will not be stored at home. NOTE: This sheet is a summary. It may not cover all possible information. If you have questions about this medicine, talk to your doctor, pharmacist, or health care provider.  2022 Elsevier/Gold Standard (2019-07-20 17:46:13)  Fluorouracil, 5-FU injection What is this medication? FLUOROURACIL, 5-FU (flure oh YOOR a sil) is a chemotherapy drug. It slows the growth of cancer cells. This medicine is used to treat many types of cancer like breast cancer, colon or rectal cancer, pancreatic cancer, and stomach cancer. This medicine may be used for other purposes; ask your health care provider or pharmacist if you have questions. COMMON BRAND NAME(S): Adrucil What should I tell my care team before I take this medication? They need to know if you have any of these conditions: blood disorders dihydropyrimidine dehydrogenase (DPD) deficiency infection (especially a virus infection such as chickenpox, cold sores, or herpes) kidney disease liver disease malnourished, poor nutrition recent or ongoing radiation therapy an unusual or allergic reaction to fluorouracil, other chemotherapy, other medicines, foods, dyes, or preservatives pregnant or trying to get pregnant breast-feeding How should I use this medication? This drug is given as an infusion or injection into a vein. It is administered in a hospital or clinic by a specially trained health care professional. Talk to your pediatrician regarding the use of this medicine in children. Special care  may be needed. Overdosage: If you think you have taken too much of this medicine contact a poison control center or emergency room at once. NOTE: This medicine is only for you. Do not share this medicine with others. What if I miss a dose? It is important not to miss your dose. Call your doctor or health care professional if you are unable to keep an appointment. What may interact with this medication? Do not take this medicine with any of the following medications: live virus vaccines This medicine may also interact with the following medications: medicines that treat or prevent blood clots like warfarin, enoxaparin, and dalteparin This  list may not describe all possible interactions. Give your health care provider a list of all the medicines, herbs, non-prescription drugs, or dietary supplements you use. Also tell them if you smoke, drink alcohol, or use illegal drugs. Some items may interact with your medicine. What should I watch for while using this medication? Visit your doctor for checks on your progress. This drug may make you feel generally unwell. This is not uncommon, as chemotherapy can affect healthy cells as well as cancer cells. Report any side effects. Continue your course of treatment even though you feel ill unless your doctor tells you to stop. In some cases, you may be given additional medicines to help with side effects. Follow all directions for their use. Call your doctor or health care professional for advice if you get a fever, chills or sore throat, or other symptoms of a cold or flu. Do not treat yourself. This drug decreases your body's ability to fight infections. Try to avoid being around people who are sick. This medicine may increase your risk to bruise or bleed. Call your doctor or health care professional if you notice any unusual bleeding. Be careful brushing and flossing your teeth or using a toothpick because you may get an infection or bleed more easily. If you  have any dental work done, tell your dentist you are receiving this medicine. Avoid taking products that contain aspirin, acetaminophen, ibuprofen, naproxen, or ketoprofen unless instructed by your doctor. These medicines may hide a fever. Do not become pregnant while taking this medicine. Women should inform their doctor if they wish to become pregnant or think they might be pregnant. There is a potential for serious side effects to an unborn child. Talk to your health care professional or pharmacist for more information. Do not breast-feed an infant while taking this medicine. Men should inform their doctor if they wish to father a child. This medicine may lower sperm counts. Do not treat diarrhea with over the counter products. Contact your doctor if you have diarrhea that lasts more than 2 days or if it is severe and watery. This medicine can make you more sensitive to the sun. Keep out of the sun. If you cannot avoid being in the sun, wear protective clothing and use sunscreen. Do not use sun lamps or tanning beds/booths. What side effects may I notice from receiving this medication? Side effects that you should report to your doctor or health care professional as soon as possible: allergic reactions like skin rash, itching or hives, swelling of the face, lips, or tongue low blood counts - this medicine may decrease the number of white blood cells, red blood cells and platelets. You may be at increased risk for infections and bleeding. signs of infection - fever or chills, cough, sore throat, pain or difficulty passing urine signs of decreased platelets or bleeding - bruising, pinpoint red spots on the skin, black, tarry stools, blood in the urine signs of decreased red blood cells - unusually weak or tired, fainting spells, lightheadedness breathing problems changes in vision chest pain mouth sores nausea and vomiting pain, swelling, redness at site where injected pain, tingling, numbness in  the hands or feet redness, swelling, or sores on hands or feet stomach pain unusual bleeding Side effects that usually do not require medical attention (report to your doctor or health care professional if they continue or are bothersome): changes in finger or toe nails diarrhea dry or itchy skin hair loss headache loss of appetite sensitivity of  eyes to the light stomach upset unusually teary eyes This list may not describe all possible side effects. Call your doctor for medical advice about side effects. You may report side effects to FDA at 1-800-FDA-1088. Where should I keep my medication? This drug is given in a hospital or clinic and will not be stored at home. NOTE: This sheet is a summary. It may not cover all possible information. If you have questions about this medicine, talk to your doctor, pharmacist, or health care provider.  2022 Elsevier/Gold Standard (2019-07-20 15:00:03)

## 2021-06-20 ENCOUNTER — Encounter: Payer: Self-pay | Admitting: *Deleted

## 2021-06-20 LAB — CANCER ANTIGEN 19-9: CA 19-9: 320 U/mL — ABNORMAL HIGH (ref 0–35)

## 2021-06-20 NOTE — Progress Notes (Signed)
Spoke with pt after first treatment, tolerated well, went for long walk after treatment yesterday, eating and drinking well, we discussed managing nausea and cold sensitivity. He is concerned about weight loss and we discussed increasing caloric intake. Encouraged to call with any issues or questions. Will RTC tomorrow for Pump D/C

## 2021-06-21 ENCOUNTER — Other Ambulatory Visit: Payer: Self-pay

## 2021-06-21 ENCOUNTER — Inpatient Hospital Stay: Payer: Medicare HMO

## 2021-06-21 VITALS — BP 122/74 | HR 51 | Temp 98.2°F | Resp 20

## 2021-06-21 DIAGNOSIS — Z79899 Other long term (current) drug therapy: Secondary | ICD-10-CM | POA: Diagnosis not present

## 2021-06-21 DIAGNOSIS — I10 Essential (primary) hypertension: Secondary | ICD-10-CM | POA: Diagnosis not present

## 2021-06-21 DIAGNOSIS — R748 Abnormal levels of other serum enzymes: Secondary | ICD-10-CM | POA: Diagnosis not present

## 2021-06-21 DIAGNOSIS — Z5111 Encounter for antineoplastic chemotherapy: Secondary | ICD-10-CM | POA: Diagnosis not present

## 2021-06-21 DIAGNOSIS — K831 Obstruction of bile duct: Secondary | ICD-10-CM | POA: Diagnosis not present

## 2021-06-21 DIAGNOSIS — C250 Malignant neoplasm of head of pancreas: Secondary | ICD-10-CM

## 2021-06-21 DIAGNOSIS — E785 Hyperlipidemia, unspecified: Secondary | ICD-10-CM | POA: Diagnosis not present

## 2021-06-21 MED ORDER — SODIUM CHLORIDE 0.9% FLUSH
10.0000 mL | INTRAVENOUS | Status: DC | PRN
  Administered 2021-06-21: 10 mL

## 2021-06-21 MED ORDER — HEPARIN SOD (PORK) LOCK FLUSH 100 UNIT/ML IV SOLN
500.0000 [IU] | Freq: Once | INTRAVENOUS | Status: AC | PRN
  Administered 2021-06-21: 500 [IU]

## 2021-06-25 ENCOUNTER — Telehealth: Payer: Self-pay | Admitting: *Deleted

## 2021-06-25 ENCOUNTER — Inpatient Hospital Stay: Payer: Medicare HMO

## 2021-06-25 ENCOUNTER — Other Ambulatory Visit: Payer: Self-pay | Admitting: *Deleted

## 2021-06-25 ENCOUNTER — Other Ambulatory Visit (HOSPITAL_BASED_OUTPATIENT_CLINIC_OR_DEPARTMENT_OTHER): Payer: Self-pay

## 2021-06-25 ENCOUNTER — Other Ambulatory Visit: Payer: Self-pay

## 2021-06-25 VITALS — BP 115/70 | HR 70 | Temp 97.1°F | Resp 18

## 2021-06-25 DIAGNOSIS — R748 Abnormal levels of other serum enzymes: Secondary | ICD-10-CM | POA: Diagnosis not present

## 2021-06-25 DIAGNOSIS — E785 Hyperlipidemia, unspecified: Secondary | ICD-10-CM | POA: Diagnosis not present

## 2021-06-25 DIAGNOSIS — C251 Malignant neoplasm of body of pancreas: Secondary | ICD-10-CM

## 2021-06-25 DIAGNOSIS — K831 Obstruction of bile duct: Secondary | ICD-10-CM | POA: Diagnosis not present

## 2021-06-25 DIAGNOSIS — Z5111 Encounter for antineoplastic chemotherapy: Secondary | ICD-10-CM | POA: Diagnosis not present

## 2021-06-25 DIAGNOSIS — Z79899 Other long term (current) drug therapy: Secondary | ICD-10-CM | POA: Diagnosis not present

## 2021-06-25 DIAGNOSIS — C250 Malignant neoplasm of head of pancreas: Secondary | ICD-10-CM

## 2021-06-25 DIAGNOSIS — I10 Essential (primary) hypertension: Secondary | ICD-10-CM | POA: Diagnosis not present

## 2021-06-25 MED ORDER — HEPARIN SOD (PORK) LOCK FLUSH 100 UNIT/ML IV SOLN
500.0000 [IU] | Freq: Once | INTRAVENOUS | Status: AC
  Administered 2021-06-25: 500 [IU] via INTRAVENOUS

## 2021-06-25 MED ORDER — SODIUM CHLORIDE 0.9 % IV SOLN: INTRAVENOUS | Status: DC

## 2021-06-25 MED ORDER — SODIUM CHLORIDE 0.9% FLUSH
10.0000 mL | Freq: Once | INTRAVENOUS | Status: AC
  Administered 2021-06-25: 10 mL via INTRAVENOUS

## 2021-06-25 MED ORDER — SODIUM CHLORIDE 0.9 % IV SOLN
10.0000 mg | Freq: Once | INTRAVENOUS | Status: AC
  Administered 2021-06-25: 10 mg via INTRAVENOUS
  Filled 2021-06-25: qty 10

## 2021-06-25 MED ORDER — SODIUM CHLORIDE 0.9 % IV SOLN: Freq: Once | INTRAVENOUS | Status: AC

## 2021-06-25 MED ORDER — DEXAMETHASONE 4 MG PO TABS
4.0000 mg | ORAL_TABLET | Freq: Two times a day (BID) | ORAL | 0 refills | Status: AC
  Filled 2021-06-25: qty 10, 5d supply, fill #0

## 2021-06-25 MED ORDER — SODIUM CHLORIDE 0.9 % IV SOLN: 10.0000 mg | Freq: Once | INTRAVENOUS | Status: DC

## 2021-06-25 NOTE — Patient Instructions (Signed)

## 2021-06-25 NOTE — Progress Notes (Signed)
Patient in for IVF today with Dexamethasone 10mg  IV given. Nausea and abdominal pain improved. Dr Benay Spice would like for him to take 4 mg BID for the next two days for delayed nausea management. Prescription escribed to [facility]

## 2021-06-25 NOTE — Addendum Note (Signed)
Addended by: Carolynne Edouard B on: 06/25/2021 10:38 AM   Modules accepted: Orders

## 2021-06-25 NOTE — Progress Notes (Signed)
Pt called and he will come in at 1200 fir IVF and dexamethasone. Verbalized understanding and appreciation

## 2021-06-26 ENCOUNTER — Encounter: Payer: Self-pay | Admitting: *Deleted

## 2021-06-26 ENCOUNTER — Other Ambulatory Visit (HOSPITAL_BASED_OUTPATIENT_CLINIC_OR_DEPARTMENT_OTHER): Payer: Self-pay

## 2021-06-26 ENCOUNTER — Other Ambulatory Visit: Payer: Self-pay | Admitting: *Deleted

## 2021-06-26 ENCOUNTER — Other Ambulatory Visit: Payer: Self-pay | Admitting: Nurse Practitioner

## 2021-06-26 DIAGNOSIS — C250 Malignant neoplasm of head of pancreas: Secondary | ICD-10-CM

## 2021-06-26 MED ORDER — LORAZEPAM 0.5 MG PO TABS
0.5000 mg | ORAL_TABLET | Freq: Three times a day (TID) | ORAL | 0 refills | Status: DC | PRN
  Filled 2021-06-26: qty 30, 10d supply, fill #0

## 2021-06-26 MED ORDER — LORAZEPAM 0.5 MG PO TABS: 0.5000 mg | ORAL_TABLET | Freq: Three times a day (TID) | ORAL | 0 refills | Status: DC

## 2021-06-26 NOTE — Progress Notes (Signed)
PATIENT NAVIGATOR PROGRESS NOTE  Name: David Jarvis Date: 06/26/2021 MRN: [id number]  DOB: Jun 14, 1951   Reason for visit: Telephone visit  Comments:  [name] called this am, had one large watery BM, took 2 immodium, smaller ones since. 1 episode of N/V with relief in discomfort afterward. Feels better this am. We discussed taking Zofran 30 minutes before breakfast then with breakfast take Dex 4 mg. Continue to push oral fluids. Instructed to call with any further issues or questions    Time spent counseling/coordinating care: 15-30 minutes

## 2021-06-26 NOTE — Progress Notes (Signed)
Pt called and having nausea without vomiting and pain at mid abdomen. Discussed with Dr Benay Spice and escribed in Lorazepam 0.5 mg every 8 hours as needed into [facility]. Discussed with patient

## 2021-06-27 ENCOUNTER — Inpatient Hospital Stay: Payer: Medicare HMO

## 2021-06-27 ENCOUNTER — Other Ambulatory Visit: Payer: Self-pay | Admitting: *Deleted

## 2021-06-27 ENCOUNTER — Encounter: Payer: Self-pay | Admitting: *Deleted

## 2021-06-27 ENCOUNTER — Other Ambulatory Visit: Payer: Self-pay

## 2021-06-27 ENCOUNTER — Other Ambulatory Visit (HOSPITAL_BASED_OUTPATIENT_CLINIC_OR_DEPARTMENT_OTHER): Payer: Self-pay

## 2021-06-27 ENCOUNTER — Other Ambulatory Visit: Payer: Self-pay | Admitting: Nurse Practitioner

## 2021-06-27 ENCOUNTER — Encounter: Payer: Self-pay | Admitting: Oncology

## 2021-06-27 DIAGNOSIS — Z79899 Other long term (current) drug therapy: Secondary | ICD-10-CM | POA: Diagnosis not present

## 2021-06-27 DIAGNOSIS — C250 Malignant neoplasm of head of pancreas: Secondary | ICD-10-CM

## 2021-06-27 DIAGNOSIS — I10 Essential (primary) hypertension: Secondary | ICD-10-CM | POA: Diagnosis not present

## 2021-06-27 DIAGNOSIS — R748 Abnormal levels of other serum enzymes: Secondary | ICD-10-CM | POA: Diagnosis not present

## 2021-06-27 DIAGNOSIS — K831 Obstruction of bile duct: Secondary | ICD-10-CM | POA: Diagnosis not present

## 2021-06-27 DIAGNOSIS — E785 Hyperlipidemia, unspecified: Secondary | ICD-10-CM | POA: Diagnosis not present

## 2021-06-27 DIAGNOSIS — Z5111 Encounter for antineoplastic chemotherapy: Secondary | ICD-10-CM | POA: Diagnosis not present

## 2021-06-27 LAB — CBC WITH DIFFERENTIAL (CANCER CENTER ONLY)
Abs Immature Granulocytes: 0.01 10*3/uL (ref 0.00–0.07)
Basophils Absolute: 0 10*3/uL (ref 0.0–0.1)
Basophils Relative: 1 %
Eosinophils Absolute: 0 10*3/uL (ref 0.0–0.5)
Eosinophils Relative: 1 %
HCT: 37.2 % — ABNORMAL LOW (ref 39.0–52.0)
Hemoglobin: 12.8 g/dL — ABNORMAL LOW (ref 13.0–17.0)
Immature Granulocytes: 0 %
Lymphocytes Relative: 30 %
Lymphs Abs: 0.9 10*3/uL (ref 0.7–4.0)
MCH: 28.4 pg (ref 26.0–34.0)
MCHC: 34.4 g/dL (ref 30.0–36.0)
MCV: 82.7 fL (ref 80.0–100.0)
Monocytes Absolute: 0.5 10*3/uL (ref 0.1–1.0)
Monocytes Relative: 16 %
Neutro Abs: 1.6 10*3/uL — ABNORMAL LOW (ref 1.7–7.7)
Neutrophils Relative %: 52 %
Platelet Count: 152 10*3/uL (ref 150–400)
RBC: 4.5 MIL/uL (ref 4.22–5.81)
RDW: 15.6 % — ABNORMAL HIGH (ref 11.5–15.5)
Smear Review: NORMAL
WBC Count: 3.1 10*3/uL — ABNORMAL LOW (ref 4.0–10.5)
nRBC: 0 % (ref 0.0–0.2)

## 2021-06-27 LAB — CMP (CANCER CENTER ONLY)
ALT: 15 U/L (ref 0–44)
AST: 12 U/L — ABNORMAL LOW (ref 15–41)
Albumin: 3.8 g/dL (ref 3.5–5.0)
Alkaline Phosphatase: 73 U/L (ref 38–126)
Anion gap: 11 (ref 5–15)
BUN: 13 mg/dL (ref 8–23)
CO2: 25 mmol/L (ref 22–32)
Calcium: 8.6 mg/dL — ABNORMAL LOW (ref 8.9–10.3)
Chloride: 99 mmol/L (ref 98–111)
Creatinine: 1.18 mg/dL (ref 0.61–1.24)
GFR, Estimated: >60 mL/min (ref >60)
Glucose, Bld: 135 mg/dL — ABNORMAL HIGH (ref 70–99)
Potassium: 3.3 mmol/L — ABNORMAL LOW (ref 3.5–5.1)
Sodium: 135 mmol/L (ref 135–145)
Total Bilirubin: 0.8 mg/dL (ref 0.3–1.2)
Total Protein: 6.4 g/dL — ABNORMAL LOW (ref 6.5–8.1)

## 2021-06-27 LAB — MAGNESIUM: Magnesium: 1.7 mg/dL (ref 1.7–2.4)

## 2021-06-27 MED ORDER — SODIUM CHLORIDE 0.9 % IV SOLN: INTRAVENOUS | Status: DC

## 2021-06-27 MED ORDER — DIPHENOXYLATE-ATROPINE 2.5-0.025 MG PO TABS
1.0000 | ORAL_TABLET | Freq: Four times a day (QID) | ORAL | 0 refills | Status: DC | PRN
Start: 2021-06-27 — End: 2022-12-05
  Filled 2021-06-27: qty 30, 8d supply, fill #0

## 2021-06-27 MED ORDER — DIPHENOXYLATE-ATROPINE 2.5-0.025 MG PO TABS: 1.0000 | ORAL_TABLET | Freq: Four times a day (QID) | ORAL | 0 refills | Status: DC | PRN

## 2021-06-27 NOTE — Progress Notes (Signed)
Spoke with patient and clarified medications Take Dexamethasone 4mg  BID for the next two days, make sure to take it food Take Lomotil 1 tab after each diarrhea stools up to 6 tabs a day.  Instructed patient to call with update tomorrow or earlier is no improvement

## 2021-06-27 NOTE — Patient Instructions (Signed)

## 2021-06-27 NOTE — Progress Notes (Signed)
Spoke with patient via phone, still experiencing watery diarrhea, emesis x 2, pain in mid abdomen improved (rated 3-4), is able to drink fluids but no food.   Took Imodium x 6 yesterday, Has taken Zofran every 12 hours with last dose at 10 am this morning. Compazine at 3 am after emesis episode.  Took Lorazepam x 1 yesterday afternoon with relief but drowsiness.  Talked with Dr Benay Spice and he asks that patient come to clinic for lab work CMET and Mag and 1 liter of NS.   Called patient back and he is coming to clinic.

## 2021-06-27 NOTE — Addendum Note (Signed)
Addended by: Lin Givens on: 06/27/2021 10:13 AM   Modules accepted: Orders

## 2021-06-27 NOTE — Progress Notes (Signed)
Dr Benay Spice saw pt in clinic. Patient received IVF 1053ml. Prescribed Lotomil to take 1 tab after each diarrhea stool up to 4 a day. To take Dex 4 mg bid for 2 days. Pt instructed to call in am with update or earlier if necessary

## 2021-06-27 NOTE — Progress Notes (Signed)
  Cbc entered per phlebotomy request

## 2021-06-28 ENCOUNTER — Encounter: Payer: Self-pay | Admitting: *Deleted

## 2021-06-28 NOTE — Progress Notes (Signed)
Pt called to report that he feels much better today and tolerating regular diet. Had two bouts of diarrhea yesterday and took lomotil 1 tab after each. No diarrhea or nausea today. Encouraged to call with any issues or questions

## 2021-06-29 ENCOUNTER — Telehealth: Payer: Self-pay | Admitting: Genetic Counselor

## 2021-06-29 ENCOUNTER — Encounter: Payer: Self-pay | Admitting: *Deleted

## 2021-06-29 NOTE — Telephone Encounter (Signed)
I attempted to contact David Jarvis to discuss his genetic testing results (84 genes). I left a voicemail requesting he call me back at [phone].  David Passy, MS, [facility] Genetic Counselor [email or web address] (P) [phone]

## 2021-06-29 NOTE — Progress Notes (Signed)
PATIENT NAVIGATOR PROGRESS NOTE  Name: MILFORD CILENTO Date: 06/29/2021 MRN: [id number]  DOB: 09/15/1950   Reason for visit:  Phone call to discuss management  Comments:  Spoke with Mr Takeshita re: pain in mid abdomen rating 2-3, "it is not bad and it comes and goes"  He is tolerating regular diet and has not had diarrhea since 10/26 at 9 pm. He describes abdomen sounds as active. He wants to avoid pain medicine. He is taking tylenol with some relief. We discussed his at home management and meds he has at home that include Lorazepam(which he describes as working well but makes him sleepy), Zofran and compazine. Also reminded him of 24/7 phone number if he has worsening issues of the weekend. He RTC Monday    Time spent counseling/coordinating care: 15-30 minutes

## 2021-07-01 ENCOUNTER — Other Ambulatory Visit: Payer: Self-pay | Admitting: Oncology

## 2021-07-02 ENCOUNTER — Other Ambulatory Visit: Payer: Self-pay

## 2021-07-02 ENCOUNTER — Other Ambulatory Visit (HOSPITAL_BASED_OUTPATIENT_CLINIC_OR_DEPARTMENT_OTHER): Payer: Self-pay

## 2021-07-02 ENCOUNTER — Inpatient Hospital Stay: Payer: Medicare HMO

## 2021-07-02 ENCOUNTER — Telehealth: Payer: Self-pay | Admitting: Genetic Counselor

## 2021-07-02 ENCOUNTER — Inpatient Hospital Stay: Payer: Medicare HMO | Admitting: Nurse Practitioner

## 2021-07-02 ENCOUNTER — Ambulatory Visit: Payer: Self-pay | Admitting: Genetic Counselor

## 2021-07-02 ENCOUNTER — Encounter: Payer: Self-pay | Admitting: Genetic Counselor

## 2021-07-02 ENCOUNTER — Encounter: Payer: Self-pay | Admitting: Nurse Practitioner

## 2021-07-02 VITALS — BP 108/78 | HR 78 | Temp 98.0°F | Resp 18 | Ht 66.0 in | Wt 152.4 lb

## 2021-07-02 DIAGNOSIS — Z1379 Encounter for other screening for genetic and chromosomal anomalies: Secondary | ICD-10-CM | POA: Insufficient documentation

## 2021-07-02 DIAGNOSIS — C250 Malignant neoplasm of head of pancreas: Secondary | ICD-10-CM

## 2021-07-02 DIAGNOSIS — K831 Obstruction of bile duct: Secondary | ICD-10-CM | POA: Diagnosis not present

## 2021-07-02 DIAGNOSIS — Z5111 Encounter for antineoplastic chemotherapy: Secondary | ICD-10-CM | POA: Diagnosis not present

## 2021-07-02 DIAGNOSIS — I10 Essential (primary) hypertension: Secondary | ICD-10-CM | POA: Diagnosis not present

## 2021-07-02 DIAGNOSIS — Z1501 Genetic susceptibility to malignant neoplasm of breast: Secondary | ICD-10-CM | POA: Insufficient documentation

## 2021-07-02 DIAGNOSIS — Z79899 Other long term (current) drug therapy: Secondary | ICD-10-CM | POA: Diagnosis not present

## 2021-07-02 DIAGNOSIS — E785 Hyperlipidemia, unspecified: Secondary | ICD-10-CM | POA: Diagnosis not present

## 2021-07-02 DIAGNOSIS — Z1509 Genetic susceptibility to other malignant neoplasm: Secondary | ICD-10-CM | POA: Insufficient documentation

## 2021-07-02 DIAGNOSIS — R748 Abnormal levels of other serum enzymes: Secondary | ICD-10-CM | POA: Diagnosis not present

## 2021-07-02 LAB — CBC WITH DIFFERENTIAL (CANCER CENTER ONLY)
Abs Immature Granulocytes: 0.16 10*3/uL — ABNORMAL HIGH (ref 0.00–0.07)
Basophils Absolute: 0 10*3/uL (ref 0.0–0.1)
Basophils Relative: 0 %
Eosinophils Absolute: 0 10*3/uL (ref 0.0–0.5)
Eosinophils Relative: 1 %
HCT: 32 % — ABNORMAL LOW (ref 39.0–52.0)
Hemoglobin: 11.3 g/dL — ABNORMAL LOW (ref 13.0–17.0)
Immature Granulocytes: 3 %
Lymphocytes Relative: 30 %
Lymphs Abs: 1.6 10*3/uL (ref 0.7–4.0)
MCH: 28.9 pg (ref 26.0–34.0)
MCHC: 35.3 g/dL (ref 30.0–36.0)
MCV: 81.8 fL (ref 80.0–100.0)
Monocytes Absolute: 1 10*3/uL (ref 0.1–1.0)
Monocytes Relative: 19 %
Neutro Abs: 2.4 10*3/uL (ref 1.7–7.7)
Neutrophils Relative %: 47 %
Platelet Count: 202 10*3/uL (ref 150–400)
RBC: 3.91 MIL/uL — ABNORMAL LOW (ref 4.22–5.81)
RDW: 15.1 % (ref 11.5–15.5)
WBC Count: 5.2 10*3/uL (ref 4.0–10.5)
nRBC: 0 % (ref 0.0–0.2)

## 2021-07-02 LAB — COMPREHENSIVE METABOLIC PANEL
ALT: 12 U/L (ref 0–44)
AST: 11 U/L — ABNORMAL LOW (ref 15–41)
Albumin: 3.2 g/dL — ABNORMAL LOW (ref 3.5–5.0)
Alkaline Phosphatase: 61 U/L (ref 38–126)
Anion gap: 10 (ref 5–15)
BUN: 9 mg/dL (ref 8–23)
CO2: 26 mmol/L (ref 22–32)
Calcium: 8.1 mg/dL — ABNORMAL LOW (ref 8.9–10.3)
Chloride: 101 mmol/L (ref 98–111)
Creatinine, Ser: 0.87 mg/dL (ref 0.61–1.24)
GFR, Estimated: >60 mL/min (ref >60)
Glucose, Bld: 107 mg/dL — ABNORMAL HIGH (ref 70–99)
Potassium: 3.1 mmol/L — ABNORMAL LOW (ref 3.5–5.1)
Sodium: 137 mmol/L (ref 135–145)
Total Bilirubin: 0.6 mg/dL (ref 0.3–1.2)
Total Protein: 5.7 g/dL — ABNORMAL LOW (ref 6.5–8.1)

## 2021-07-02 LAB — MAGNESIUM: Magnesium: 1.6 mg/dL — ABNORMAL LOW (ref 1.7–2.4)

## 2021-07-02 MED ORDER — PALONOSETRON HCL INJECTION 0.25 MG/5ML
0.2500 mg | Freq: Once | INTRAVENOUS | Status: AC
  Administered 2021-07-02: 0.25 mg via INTRAVENOUS
  Filled 2021-07-02: qty 5

## 2021-07-02 MED ORDER — SODIUM CHLORIDE 0.9 % IV SOLN
150.0000 mg | Freq: Once | INTRAVENOUS | Status: AC
  Administered 2021-07-02: 150 mg via INTRAVENOUS
  Filled 2021-07-02: qty 5

## 2021-07-02 MED ORDER — POTASSIUM CHLORIDE CRYS ER 20 MEQ PO TBCR
EXTENDED_RELEASE_TABLET | ORAL | 1 refills | Status: DC
  Filled 2021-07-02: qty 36, 4d supply, fill #0
  Filled 2021-08-06: qty 36, 33d supply, fill #1

## 2021-07-02 MED ORDER — DEXAMETHASONE 4 MG PO TABS
ORAL_TABLET | ORAL | 1 refills | Status: DC
  Filled 2021-07-02: qty 12, 6d supply, fill #0
  Filled 2021-08-06: qty 12, 6d supply, fill #1

## 2021-07-02 MED ORDER — ATROPINE SULFATE 1 MG/ML IV SOLN
0.5000 mg | Freq: Once | INTRAVENOUS | Status: AC | PRN
  Administered 2021-07-02: 0.5 mg via INTRAVENOUS
  Filled 2021-07-02: qty 1

## 2021-07-02 MED ORDER — SODIUM CHLORIDE 0.9 % IV SOLN
2000.0000 mg/m2 | INTRAVENOUS | Status: DC
  Administered 2021-07-02: 3650 mg via INTRAVENOUS
  Filled 2021-07-02: qty 73

## 2021-07-02 MED ORDER — MAGNESIUM OXIDE -MG SUPPLEMENT 400 (240 MG) MG PO TABS
400.0000 mg | ORAL_TABLET | Freq: Every day | ORAL | 0 refills | Status: DC
  Filled 2021-07-02: qty 120, 120d supply, fill #0

## 2021-07-02 MED ORDER — OXALIPLATIN CHEMO INJECTION 100 MG/20ML
85.0000 mg/m2 | Freq: Once | INTRAVENOUS | Status: AC
  Administered 2021-07-02: 155 mg via INTRAVENOUS
  Filled 2021-07-02: qty 31

## 2021-07-02 MED ORDER — SODIUM CHLORIDE 0.9 % IV SOLN
120.0000 mg/m2 | Freq: Once | INTRAVENOUS | Status: AC
  Administered 2021-07-02: 220 mg via INTRAVENOUS
  Filled 2021-07-02: qty 11

## 2021-07-02 MED ORDER — SODIUM CHLORIDE 0.9 % IV SOLN
10.0000 mg | Freq: Once | INTRAVENOUS | Status: AC
  Administered 2021-07-02: 10 mg via INTRAVENOUS
  Filled 2021-07-02: qty 1

## 2021-07-02 MED ORDER — SODIUM CHLORIDE 0.9 % IV SOLN
400.0000 mg/m2 | Freq: Once | INTRAVENOUS | Status: AC
  Administered 2021-07-02: 728 mg via INTRAVENOUS
  Filled 2021-07-02: qty 36.4

## 2021-07-02 MED ORDER — DEXTROSE 5 % IV SOLN: Freq: Once | INTRAVENOUS | Status: AC

## 2021-07-02 NOTE — Progress Notes (Signed)
Patient presents for treatment. RN assessment completed along with the following:  Labs/vitals reviewed - Yes, and within treatment parameters.   Weight within 10% of previous measurement - Yes Oncology Treatment Attestation completed for current therapy- Yes, on date 05/25/2021 Informed consent completed and reflects current therapy/intent - Yes, on date 06/19/2021             Provider progress note reviewed - Yes, today's provider note was reviewed. Treatment/Antibody/Supportive plan reviewed - Yes, and there are no adjustments needed for today's treatment. S&H and other orders reviewed - Yes, and there are no additional orders identified. Previous treatment date reviewed - Yes, and the appropriate amount of time has elapsed between treatments. Clinic Hand Off Received from - Velta Addison, Lattie Haw, NP.  Patient to proceed with treatment.

## 2021-07-02 NOTE — Progress Notes (Signed)
  [facility] OFFICE PROGRESS NOTE   Diagnosis: Pancreas cancer  INTERVAL HISTORY:   David Jarvis returns as scheduled.  He completed cycle 1 FOLFIRINOX 06/19/2021.  He developed nausea around day 3 or 4.  Zofran and Compazine were not effective.  He felt bad on 06/24/2021.  He subsequently developed diarrhea and worsening nausea.  The nausea was relieved with dexamethasone.  The diarrhea improved with Lomotil.  No mouth sores.  No hand or foot pain or redness.  The abdominal pain was present prior to chemotherapy, increased after chemotherapy, now back to baseline.  He noted fatigue/weakness.  He required IV fluids x2.  Objective:  Vital signs in last 24 hours:  Blood pressure 108/78, pulse 78, temperature 98 F (36.7 C), temperature source Oral, resp. rate 18, height 5\' 6"  (1.676 m), weight 152 lb 6.4 oz (69.1 kg), SpO2 96 %.    HEENT: No thrush or ulcers. Resp: Lungs clear bilaterally. Cardio: Regular rate and rhythm. GI: Abdomen soft and nontender.  No hepatomegaly. Vascular: No leg edema. Skin: Palms without erythema. Port-A-Cath without erythema.   Lab Results:  Lab Results  Component Value Date   WBC 5.2 07/02/2021   HGB 11.3 (L) 07/02/2021   HCT 32.0 (L) 07/02/2021   MCV 81.8 07/02/2021   PLT 202 07/02/2021   NEUTROABS PENDING 07/02/2021    Imaging:  No results found.  Medications: I have reviewed the patient's current medications.  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, stent placed EUS 05/23/2021-25 x 15 mm pancreas head mass with irregular borders, no lymphadenopathy, no vascular involvement, T2 N0 by ultrasound CT chest 06/01/2021-negative for metastatic disease CT abdomen pancreas protocol 06/01/2021-3.2 x 2.3 cm  hypoenhancing pancreas head mass inseparable from the third portion of the duodenum, 1 cm left retroperitoneal lymph node, no vascular involvement Cycle 1 FOLFIRINOX 06/19/2021 Cycle 2 FOLFIRINOX 07/02/2021, irinotecan and 5-FU dose reduced, prophylactic dexamethasone added beginning day of pump discontinuation Obstructive jaundice secondary #1 Hypertension Admission in February 2022 with elevated liver enzymes MRI/MRCP-intra and extrahepatic biliary duct dilation, tapering smoothly to the ampulla, no mass or filling defect ERCP 10/06/2020-dilated main bile duct, biliary tree was swept and nothing was found, cytology from bile duct brushing-no malignant cells identified 5.  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  GERD 7.  Squamous cell carcinoma left forearm 2014 8.  Hyperlipidemia  Disposition: David Jarvis appears stable.  He has completed 1 cycle of FOLFIRINOX.  Post chemotherapy course complicated by delayed nausea and diarrhea.  Plan to proceed with cycle 2 today as scheduled.  5-FU and irinotecan will be dose reduced.  He will begin prophylactic dexamethasone, 4 mg twice daily for 3 days, beginning day of pump discontinuation.  He has Compazine, Zofran and will pick up lorazepam from the pharmacy today for as needed use for nausea.  Lomotil was effective for the diarrhea.  CBC and chemistry panel reviewed.  Labs adequate to proceed with treatment.  He has mild hypokalemia and hypomagnesia.  Prescriptions sent to his pharmacy for both.  He will return for lab, follow-up, cycle 3 FOLFIRINOX in 2 weeks.  He will contact the office in the interim with any problems.  Ned Card ANP/GNP-BC   07/02/2021  8:42 AM

## 2021-07-02 NOTE — Patient Instructions (Signed)
[facility]   Discharge Instructions: Thank you for choosing [facility] to provide your oncology and hematology care.   If you have a lab appointment with the [facility], please go directly to [facility] and check in at the registration area.   Wear comfortable clothing and clothing appropriate for easy access to any Portacath or PICC line.   We strive to give you quality time with your provider. You may need to reschedule your appointment if you arrive late (15 or more minutes).  Arriving late affects you and other patients whose appointments are after yours.  Also, if you miss three or more appointments without notifying the office, you may be dismissed from the clinic at the provider's discretion.      For prescription refill requests, have your pharmacy contact our office and allow 72 hours for refills to be completed.    Today you received the following chemotherapy and/or immunotherapy agents Oxaliplatin (ELOXATIN), Irinotecan (CAMPTOSAR), Leucovorin & Flourouracil (ADRUCIL).      To help prevent nausea and vomiting after your treatment, we encourage you to take your nausea medication as directed.  BELOW ARE SYMPTOMS THAT SHOULD BE REPORTED IMMEDIATELY: *FEVER GREATER THAN 100.4 F (38 C) OR HIGHER *CHILLS OR SWEATING *NAUSEA AND VOMITING THAT IS NOT CONTROLLED WITH YOUR NAUSEA MEDICATION *UNUSUAL SHORTNESS OF BREATH *UNUSUAL BRUISING OR BLEEDING *URINARY PROBLEMS (pain or burning when urinating, or frequent urination) *BOWEL PROBLEMS (unusual diarrhea, constipation, pain near the anus) TENDERNESS IN MOUTH AND THROAT WITH OR WITHOUT PRESENCE OF ULCERS (sore throat, sores in mouth, or a toothache) UNUSUAL RASH, SWELLING OR PAIN  UNUSUAL VAGINAL DISCHARGE OR ITCHING   Items with * indicate a potential emergency and should be followed up as soon as possible or go to the Emergency Department if any problems should occur.  Please  show the CHEMOTHERAPY ALERT CARD or IMMUNOTHERAPY ALERT CARD at check-in to the Emergency Department and triage nurse.  Should you have questions after your visit or need to cancel or reschedule your appointment, please contact [facility]  Dept: [phone]  and follow the prompts.  Office hours are 8:00 a.m. to 4:30 p.m. Monday - Friday. Please note that voicemails left after 4:00 p.m. may not be returned until the following business day.  We are closed weekends and major holidays. You have access to a nurse at all times for urgent questions. Please call the main number to the clinic Dept: [phone] and follow the prompts.   For any non-urgent questions, you may also contact your provider using MyChart. We now offer e-Visits for anyone 9 and older to request care online for non-urgent symptoms. For details visit [email or web address].   Also download the MyChart app! Go to the app store, search "MyChart", open the app, select , and log in with your MyChart username and password.  Due to Covid, a mask is required upon entering the hospital/clinic. If you do not have a mask, one will be given to you upon arrival. For doctor visits, patients may have 1 support person aged 79 or older with them. For treatment visits, patients cannot have anyone with them due to current Covid guidelines and our immunocompromised population.   Oxaliplatin Injection What is this medication? OXALIPLATIN (ox AL i PLA tin) is a chemotherapy drug. It targets fast dividing cells, like cancer cells, and causes these cells to die. This medicine is used to treat cancers of the colon and  rectum, and many other cancers. This medicine may be used for other purposes; ask your health care provider or pharmacist if you have questions. COMMON BRAND NAME(S): Eloxatin What should I tell my care team before I take this medication? They need to know if you have any of these conditions: heart  disease history of irregular heartbeat liver disease low blood counts, like white cells, platelets, or red blood cells lung or breathing disease, like asthma take medicines that treat or prevent blood clots tingling of the fingers or toes, or other nerve disorder an unusual or allergic reaction to oxaliplatin, other chemotherapy, other medicines, foods, dyes, or preservatives pregnant or trying to get pregnant breast-feeding How should I use this medication? This drug is given as an infusion into a vein. It is administered in a hospital or clinic by a specially trained health care professional. Talk to your pediatrician regarding the use of this medicine in children. Special care may be needed. Overdosage: If you think you have taken too much of this medicine contact a poison control center or emergency room at once. NOTE: This medicine is only for you. Do not share this medicine with others. What if I miss a dose? It is important not to miss a dose. Call your doctor or health care professional if you are unable to keep an appointment. What may interact with this medication? Do not take this medicine with any of the following medications: cisapride dronedarone pimozide thioridazine This medicine may also interact with the following medications: aspirin and aspirin-like medicines certain medicines that treat or prevent blood clots like warfarin, apixaban, dabigatran, and rivaroxaban cisplatin cyclosporine diuretics medicines for infection like acyclovir, adefovir, amphotericin B, bacitracin, cidofovir, foscarnet, ganciclovir, gentamicin, pentamidine, vancomycin NSAIDs, medicines for pain and inflammation, like ibuprofen or naproxen other medicines that prolong the QT interval (an abnormal heart rhythm) pamidronate zoledronic acid This list may not describe all possible interactions. Give your health care provider a list of all the medicines, herbs, non-prescription drugs, or dietary  supplements you use. Also tell them if you smoke, drink alcohol, or use illegal drugs. Some items may interact with your medicine. What should I watch for while using this medication? Your condition will be monitored carefully while you are receiving this medicine. You may need blood work done while you are taking this medicine. This medicine may make you feel generally unwell. This is not uncommon as chemotherapy can affect healthy cells as well as cancer cells. Report any side effects. Continue your course of treatment even though you feel ill unless your healthcare professional tells you to stop. This medicine can make you more sensitive to cold. Do not drink cold drinks or use ice. Cover exposed skin before coming in contact with cold temperatures or cold objects. When out in cold weather wear warm clothing and cover your mouth and nose to warm the air that goes into your lungs. Tell your doctor if you get sensitive to the cold. Do not become pregnant while taking this medicine or for 9 months after stopping it. Women should inform their health care professional if they wish to become pregnant or think they might be pregnant. Men should not father a child while taking this medicine and for 6 months after stopping it. There is potential for serious side effects to an unborn child. Talk to your health care professional for more information. Do not breast-feed a child while taking this medicine or for 3 months after stopping it. This medicine has caused ovarian  failure in some women. This medicine may make it more difficult to get pregnant. Talk to your health care professional if you are concerned about your fertility. This medicine has caused decreased sperm counts in some men. This may make it more difficult to father a child. Talk to your health care professional if you are concerned about your fertility. This medicine may increase your risk of getting an infection. Call your health care professional  for advice if you get a fever, chills, or sore throat, or other symptoms of a cold or flu. Do not treat yourself. Try to avoid being around people who are sick. Avoid taking medicines that contain aspirin, acetaminophen, ibuprofen, naproxen, or ketoprofen unless instructed by your health care professional. These medicines may hide a fever. Be careful brushing or flossing your teeth or using a toothpick because you may get an infection or bleed more easily. If you have any dental work done, tell your dentist you are receiving this medicine. What side effects may I notice from receiving this medication? Side effects that you should report to your doctor or health care professional as soon as possible: allergic reactions like skin rash, itching or hives, swelling of the face, lips, or tongue breathing problems cough low blood counts - this medicine may decrease the number of white blood cells, red blood cells, and platelets. You may be at increased risk for infections and bleeding nausea, vomiting pain, redness, or irritation at site where injected pain, tingling, numbness in the hands or feet signs and symptoms of bleeding such as bloody or black, tarry stools; red or dark brown urine; spitting up blood or brown material that looks like coffee grounds; red spots on the skin; unusual bruising or bleeding from the eyes, gums, or nose signs and symptoms of a dangerous change in heartbeat or heart rhythm like chest pain; dizziness; fast, irregular heartbeat; palpitations; feeling faint or lightheaded; falls signs and symptoms of infection like fever; chills; cough; sore throat; pain or trouble passing urine signs and symptoms of liver injury like dark yellow or brown urine; general ill feeling or flu-like symptoms; light-colored stools; loss of appetite; nausea; right upper belly pain; unusually weak or tired; yellowing of the eyes or skin signs and symptoms of low red blood cells or anemia such as  unusually weak or tired; feeling faint or lightheaded; falls signs and symptoms of muscle injury like dark urine; trouble passing urine or change in the amount of urine; unusually weak or tired; muscle pain; back pain Side effects that usually do not require medical attention (report to your doctor or health care professional if they continue or are bothersome): changes in taste diarrhea gas hair loss loss of appetite mouth sores This list may not describe all possible side effects. Call your doctor for medical advice about side effects. You may report side effects to FDA at 1-800-FDA-1088. Where should I keep my medication? This drug is given in a hospital or clinic and will not be stored at home. NOTE: This sheet is a summary. It may not cover all possible information. If you have questions about this medicine, talk to your doctor, pharmacist, or health care provider.  2022 Elsevier/Gold Standard (2019-01-06 12:20:35)  Leucovorin injection What is this medication? LEUCOVORIN (loo koe VOR in) is used to prevent or treat the harmful effects of some medicines. This medicine is used to treat anemia caused by a low amount of folic acid in the body. It is also used with 5-fluorouracil (5-FU) to  treat colon cancer. This medicine may be used for other purposes; ask your health care provider or pharmacist if you have questions. What should I tell my care team before I take this medication? They need to know if you have any of these conditions: anemia from low levels of vitamin B-12 in the blood an unusual or allergic reaction to leucovorin, folic acid, other medicines, foods, dyes, or preservatives pregnant or trying to get pregnant breast-feeding How should I use this medication? This medicine is for injection into a muscle or into a vein. It is given by a health care professional in a hospital or clinic setting. Talk to your pediatrician regarding the use of this medicine in children. Special  care may be needed. Overdosage: If you think you have taken too much of this medicine contact a poison control center or emergency room at once. NOTE: This medicine is only for you. Do not share this medicine with others. What if I miss a dose? This does not apply. What may interact with this medication? capecitabine fluorouracil phenobarbital phenytoin primidone trimethoprim-sulfamethoxazole This list may not describe all possible interactions. Give your health care provider a list of all the medicines, herbs, non-prescription drugs, or dietary supplements you use. Also tell them if you smoke, drink alcohol, or use illegal drugs. Some items may interact with your medicine. What should I watch for while using this medication? Your condition will be monitored carefully while you are receiving this medicine. This medicine may increase the side effects of 5-fluorouracil, 5-FU. Tell your doctor or health care professional if you have diarrhea or mouth sores that do not get better or that get worse. What side effects may I notice from receiving this medication? Side effects that you should report to your doctor or health care professional as soon as possible: allergic reactions like skin rash, itching or hives, swelling of the face, lips, or tongue breathing problems fever, infection mouth sores unusual bleeding or bruising unusually weak or tired Side effects that usually do not require medical attention (report to your doctor or health care professional if they continue or are bothersome): constipation or diarrhea loss of appetite nausea, vomiting This list may not describe all possible side effects. Call your doctor for medical advice about side effects. You may report side effects to FDA at 1-800-FDA-1088. Where should I keep my medication? This drug is given in a hospital or clinic and will not be stored at home. NOTE: This sheet is a summary. It may not cover all possible information.  If you have questions about this medicine, talk to your doctor, pharmacist, or health care provider.  2022 Elsevier/Gold Standard (2008-02-23 16:50:29)  Irinotecan injection What is this medication? IRINOTECAN (ir in oh TEE kan ) is a chemotherapy drug. It is used to treat colon and rectal cancer. This medicine may be used for other purposes; ask your health care provider or pharmacist if you have questions. COMMON BRAND NAME(S): Camptosar What should I tell my care team before I take this medication? They need to know if you have any of these conditions: dehydration diarrhea infection (especially a virus infection such as chickenpox, cold sores, or herpes) liver disease low blood counts, like low white cell, platelet, or red cell counts low levels of calcium, magnesium, or potassium in the blood recent or ongoing radiation therapy an unusual or allergic reaction to irinotecan, other medicines, foods, dyes, or preservatives pregnant or trying to get pregnant breast-feeding How should I use this medication? This  drug is given as an infusion into a vein. It is administered in a hospital or clinic by a specially trained health care professional. Talk to your pediatrician regarding the use of this medicine in children. Special care may be needed. Overdosage: If you think you have taken too much of this medicine contact a poison control center or emergency room at once. NOTE: This medicine is only for you. Do not share this medicine with others. What if I miss a dose? It is important not to miss your dose. Call your doctor or health care professional if you are unable to keep an appointment. What may interact with this medication? Do not take this medicine with any of the following medications: cobicistat itraconazole This medicine may interact with the following medications: antiviral medicines for HIV or AIDS certain antibiotics like rifampin or rifabutin certain medicines for fungal  infections like ketoconazole, posaconazole, and voriconazole certain medicines for seizures like carbamazepine, phenobarbital, phenotoin clarithromycin gemfibrozil nefazodone [place]'s Wort This list may not describe all possible interactions. Give your health care provider a list of all the medicines, herbs, non-prescription drugs, or dietary supplements you use. Also tell them if you smoke, drink alcohol, or use illegal drugs. Some items may interact with your medicine. What should I watch for while using this medication? Your condition will be monitored carefully while you are receiving this medicine. You will need important blood work done while you are taking this medicine. This drug may make you feel generally unwell. This is not uncommon, as chemotherapy can affect healthy cells as well as cancer cells. Report any side effects. Continue your course of treatment even though you feel ill unless your doctor tells you to stop. In some cases, you may be given additional medicines to help with side effects. Follow all directions for their use. You may get drowsy or dizzy. Do not drive, use machinery, or do anything that needs mental alertness until you know how this medicine affects you. Do not stand or sit up quickly, especially if you are an older patient. This reduces the risk of dizzy or fainting spells. Call your health care professional for advice if you get a fever, chills, or sore throat, or other symptoms of a cold or flu. Do not treat yourself. This medicine decreases your body's ability to fight infections. Try to avoid being around people who are sick. Avoid taking products that contain aspirin, acetaminophen, ibuprofen, naproxen, or ketoprofen unless instructed by your doctor. These medicines may hide a fever. This medicine may increase your risk to bruise or bleed. Call your doctor or health care professional if you notice any unusual bleeding. Be careful brushing and flossing your  teeth or using a toothpick because you may get an infection or bleed more easily. If you have any dental work done, tell your dentist you are receiving this medicine. Do not become pregnant while taking this medicine or for 6 months after stopping it. Women should inform their health care professional if they wish to become pregnant or think they might be pregnant. Men should not father a child while taking this medicine and for 3 months after stopping it. There is potential for serious side effects to an unborn child. Talk to your health care professional for more information. Do not breast-feed an infant while taking this medicine or for 7 days after stopping it. This medicine has caused ovarian failure in some women. This medicine may make it more difficult to get pregnant. Talk to your  health care professional if you are concerned about your fertility. This medicine has caused decreased sperm counts in some men. This may make it more difficult to father a child. Talk to your health care professional if you are concerned about your fertility. What side effects may I notice from receiving this medication? Side effects that you should report to your doctor or health care professional as soon as possible: allergic reactions like skin rash, itching or hives, swelling of the face, lips, or tongue chest pain diarrhea flushing, runny nose, sweating during infusion low blood counts - this medicine may decrease the number of white blood cells, red blood cells and platelets. You may be at increased risk for infections and bleeding. nausea, vomiting pain, swelling, warmth in the leg signs of decreased platelets or bleeding - bruising, pinpoint red spots on the skin, black, tarry stools, blood in the urine signs of infection - fever or chills, cough, sore throat, pain or difficulty passing urine signs of decreased red blood cells - unusually weak or tired, fainting spells, lightheadedness Side effects that  usually do not require medical attention (report to your doctor or health care professional if they continue or are bothersome): constipation hair loss headache loss of appetite mouth sores stomach pain This list may not describe all possible side effects. Call your doctor for medical advice about side effects. You may report side effects to FDA at 1-800-FDA-1088. Where should I keep my medication? This drug is given in a hospital or clinic and will not be stored at home. NOTE: This sheet is a summary. It may not cover all possible information. If you have questions about this medicine, talk to your doctor, pharmacist, or health care provider.  2022 Elsevier/Gold Standard (2019-07-20 17:46:13)  Fluorouracil, 5-FU injection What is this medication? FLUOROURACIL, 5-FU (flure oh YOOR a sil) is a chemotherapy drug. It slows the growth of cancer cells. This medicine is used to treat many types of cancer like breast cancer, colon or rectal cancer, pancreatic cancer, and stomach cancer. This medicine may be used for other purposes; ask your health care provider or pharmacist if you have questions. COMMON BRAND NAME(S): Adrucil What should I tell my care team before I take this medication? They need to know if you have any of these conditions: blood disorders dihydropyrimidine dehydrogenase (DPD) deficiency infection (especially a virus infection such as chickenpox, cold sores, or herpes) kidney disease liver disease malnourished, poor nutrition recent or ongoing radiation therapy an unusual or allergic reaction to fluorouracil, other chemotherapy, other medicines, foods, dyes, or preservatives pregnant or trying to get pregnant breast-feeding How should I use this medication? This drug is given as an infusion or injection into a vein. It is administered in a hospital or clinic by a specially trained health care professional. Talk to your pediatrician regarding the use of this medicine in  children. Special care may be needed. Overdosage: If you think you have taken too much of this medicine contact a poison control center or emergency room at once. NOTE: This medicine is only for you. Do not share this medicine with others. What if I miss a dose? It is important not to miss your dose. Call your doctor or health care professional if you are unable to keep an appointment. What may interact with this medication? Do not take this medicine with any of the following medications: live virus vaccines This medicine may also interact with the following medications: medicines that treat or prevent blood clots like warfarin,  enoxaparin, and dalteparin This list may not describe all possible interactions. Give your health care provider a list of all the medicines, herbs, non-prescription drugs, or dietary supplements you use. Also tell them if you smoke, drink alcohol, or use illegal drugs. Some items may interact with your medicine. What should I watch for while using this medication? Visit your doctor for checks on your progress. This drug may make you feel generally unwell. This is not uncommon, as chemotherapy can affect healthy cells as well as cancer cells. Report any side effects. Continue your course of treatment even though you feel ill unless your doctor tells you to stop. In some cases, you may be given additional medicines to help with side effects. Follow all directions for their use. Call your doctor or health care professional for advice if you get a fever, chills or sore throat, or other symptoms of a cold or flu. Do not treat yourself. This drug decreases your body's ability to fight infections. Try to avoid being around people who are sick. This medicine may increase your risk to bruise or bleed. Call your doctor or health care professional if you notice any unusual bleeding. Be careful brushing and flossing your teeth or using a toothpick because you may get an infection or  bleed more easily. If you have any dental work done, tell your dentist you are receiving this medicine. Avoid taking products that contain aspirin, acetaminophen, ibuprofen, naproxen, or ketoprofen unless instructed by your doctor. These medicines may hide a fever. Do not become pregnant while taking this medicine. Women should inform their doctor if they wish to become pregnant or think they might be pregnant. There is a potential for serious side effects to an unborn child. Talk to your health care professional or pharmacist for more information. Do not breast-feed an infant while taking this medicine. Men should inform their doctor if they wish to father a child. This medicine may lower sperm counts. Do not treat diarrhea with over the counter products. Contact your doctor if you have diarrhea that lasts more than 2 days or if it is severe and watery. This medicine can make you more sensitive to the sun. Keep out of the sun. If you cannot avoid being in the sun, wear protective clothing and use sunscreen. Do not use sun lamps or tanning beds/booths. What side effects may I notice from receiving this medication? Side effects that you should report to your doctor or health care professional as soon as possible: allergic reactions like skin rash, itching or hives, swelling of the face, lips, or tongue low blood counts - this medicine may decrease the number of white blood cells, red blood cells and platelets. You may be at increased risk for infections and bleeding. signs of infection - fever or chills, cough, sore throat, pain or difficulty passing urine signs of decreased platelets or bleeding - bruising, pinpoint red spots on the skin, black, tarry stools, blood in the urine signs of decreased red blood cells - unusually weak or tired, fainting spells, lightheadedness breathing problems changes in vision chest pain mouth sores nausea and vomiting pain, swelling, redness at site where  injected pain, tingling, numbness in the hands or feet redness, swelling, or sores on hands or feet stomach pain unusual bleeding Side effects that usually do not require medical attention (report to your doctor or health care professional if they continue or are bothersome): changes in finger or toe nails diarrhea dry or itchy skin hair loss headache loss  of appetite sensitivity of eyes to the light stomach upset unusually teary eyes This list may not describe all possible side effects. Call your doctor for medical advice about side effects. You may report side effects to FDA at 1-800-FDA-1088. Where should I keep my medication? This drug is given in a hospital or clinic and will not be stored at home. NOTE: This sheet is a summary. It may not cover all possible information. If you have questions about this medicine, talk to your doctor, pharmacist, or health care provider.  2022 Elsevier/Gold Standard (2019-07-20 15:00:03)  The chemotherapy medication bag should finish at 46 hours, 96 hours, or 7 days. For example, if your pump is scheduled for 46 hours and it was put on at 4:00 p.m., it should finish at 2:00 p.m. the day it is scheduled to come off regardless of your appointment time.     Estimated time to finish at 1:30 p.m. on Wednesday 07/04/2021.   If the display on your pump reads "Low Volume" and it is beeping, take the batteries out of the pump and come to the cancer center for it to be taken off.   If the pump alarms go off prior to the pump reading "Low Volume" then call [phone] and someone can assist you.  If the plunger comes out and the chemotherapy medication is leaking out, please use your home chemo spill kit to clean up the spill. Do NOT use paper towels or other household products.  If you have problems or questions regarding your pump, please call either [phone] (24 hours a day) or the cancer center Monday-Friday 8:00 a.m.- 4:30 p.m. at the clinic  number and we will assist you. If you are unable to get assistance, then go to the nearest Emergency Department and ask the staff to contact the IV team for assistance.

## 2021-07-02 NOTE — Telephone Encounter (Signed)
I contacted Mr. Sapia to discuss his genetic testing results. 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 We reviewed NCCN recommendations and familial risks.  The test report has been scanned into EPIC and is located under the Molecular Pathology section of the Results Review tab.  A portion of the result report is included below for reference. Detailed clinic note to follow.  Lucille Passy, MS, [facility] Genetic Counselor [email or web address] (P) [phone]

## 2021-07-02 NOTE — Progress Notes (Signed)
HPI:   [name] was previously seen in the [place] clinic due to a personal and family history of cancer and concerns regarding a hereditary predisposition to cancer. Please refer to our prior cancer genetics clinic note for more information regarding our discussion, assessment and recommendations, at the time. [name] recent genetic test results were disclosed to him, as were recommendations warranted by these results. These results and recommendations are discussed in more detail below.  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 Report date is 06/26/2021.  The Multi-Cancer + RNA Panel offered by [facility] includes sequencing and/or deletion/duplication analysis of the following 84 genes:  AIP*, ALK, APC*, ATM*, AXIN2*, BAP1*, BARD1*, BLM*, BMPR1A*, BRCA1*, BRCA2*, BRIP1*, CASR, CDC73*, CDH1*, CDK4, CDKN1B*, CDKN1C*, CDKN2A, CEBPA, CHEK2*, CTNNA1*, DICER1*, DIS3L2*, EGFR, EPCAM, FH*, FLCN*, GATA2*, GPC3, GREM1, HOXB13, HRAS, KIT, MAX*, MEN1*, MET, MITF, MLH1*, MSH2*, MSH3*, MSH6*, MUTYH*, NBN*, NF1*, NF2*, NTHL1*, PALB2*, PDGFRA, PHOX2B, PMS2*, POLD1*, POLE*, POT1*, PRKAR1A*, PTCH1*, PTEN*, RAD50*, David Jarvis*, David Jarvis*, RB1*, RECQL4, RET, RUNX1*, SDHA*, SDHAF2*, SDHB*, SDHC*, SDHD*, SMAD4*, SMARCA4*, SMARCB1*, SMARCE1*, STK11*, SUFU*, TERC, TERT, TMEM127*, Tp53*, TSC1*, TSC2*, VHL*, WRN*, and WT1.  RNA analysis is performed for * genes.     FAMILY HISTORY:  We obtained a  detailed, 4-generation family history.  Significant diagnoses are listed below:     [name] paternal aunt was diagnosed with an unknown cancer in her 57s, she died in her 21s. This aunt's daughter was diagnosed with breast cancer in her 38s. [name] is unaware of previous family history of genetic testing for hereditary cancer risks. There no reported Ashkenazi Jewish ancestry.   GENETIC TEST RESULTS:  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 Specifically, this variant is [id number].  The test report has been scanned into EPIC and is located under the Molecular Pathology section of the Results Review tab.  A portion of the result report is included below for reference. Genetic testing reported out on 06/26/2021.     Clinical Information: Hereditary breast and ovarian cancer (HBOC) syndrome is characterized by an increased lifetime risk for, generally, adult-onset cancers including, breast, contralateral breast, male breast, ovarian, prostate, melanoma and pancreatic.  The cancers associated with BRCA1 are:  Male breast cancer, up to an 87% risk Up to 73% of the breast cancers diagnosed in women with BRCA1 mutations are triple negative breast cancer In women with a history of breast cancer, the cumulative risk for contralateral breast cancer 5 years after breast cancer diagnosis is 15%. The cumulative 20- year risk is 40% or greater. Male breast cancer, up to a 2% risk Ovarian cancer, up to a 54% risk Pancreatic cancer, 1-3% risk Prostate cancer, elevated risk BRCA1 also has preliminary evidence for an association with melanoma and hematologic malignancy Limited data suggest there may be a slightly increased risk of serous uterine cancer    Management Recommendations:  Breast Screening/Risk  Reduction:  Women: Breast cancer screening includes: Breast awareness beginning at age 3 Monthly self-breast examination  beginning at age 9 Clinical breast examination every 6-12 months beginning at age 9 or at the age of the earliest diagnosed breast cancer in the family, if onset was before age 4 Annual breast MRI with contrast beginning at age 63-29 (or annual mammograms with consideration of tomosynthesis if MRI is unavailable), although the age to initiate screening may be individualized based on family history Annual mammogram beginning at age 27 until age 16 with consideration of tomosynthesis with continuation of annual breast MRI with contrast The option of prophylactic bilateral risk-reducing mastectomy (RRM), removal of the breast tissue before cancer develops, is the best option for significantly decreasing the risk of developing breast cancer. Studies have shown mastectomies reduce the risk of breast cancer by 90-95% in women with a BRCA1 mutation. Breast reconstruction can also be performed immediately following the mastectomy, depending on the type of reconstruction chosen. For women with a BRCA1 pathogenic or likely pathogenic variant who are treated for breast cancer and have not had a bilateral mastectomy, screening with annual mammogram with consideration of tomosynthesis and breast MRI should continue as described above.  Males: Breast self-exam training and education starting at age 66 years Annual male chest exams by a general physician starting at the age of 28, in addition to monthly self-chest exams.   Gynecological Cancer Screening/Risk Reduction: It is recommended that women with a BRCA1 mutation consider having a risk-reducing salpingo oophorectomy (RRSO), removal of the ovaries and fallopian tubes, between the ages of 19-40 or once childbearing is completed. Having a RRSO is estimated to reduce the risk of ovarian cancer by up to 96%. There is still a small risk of developing an "ovarian-like" cancer in the lining of the abdomen, called the peritoneum. Another benefit to having the ovaries  removed is the risk reduction for breast cancer. If the ovaries are removed before menopause, the risk of developing breast cancer is reduced. Women undergoing a RRSO should be aware of the potential risks and benefits of concurrent hysterectomy. Hormone replacement therapy could be considered based on the physician's discretion. Ovarian cancer screening is an option for women who chose not to have a RRSO or who, as of yet, have not completed their family. Current screening methods for ovarian cancer are neither sensitive nor specific, meaning that often early stage ovarian cancer cannot be diagnosed through this screening.  Screening can also be falsely positive with no cancer present. For this reason, RRSO is recommended over screening. If ovarian cancer screening is recommended by your physician, it could include: CA-125 blood tests Transvaginal ultrasounds Clinical pelvic exams   Skin Cancer Screening and Risk Reduction: Regular skin self-examinations Individuals should notify their physicians of any changes to moles such as increasing in size, darkening in color, or other change in appearance. Annual skin examinations by a dermatologist  Follow sun-safety recommendations such as: Using UVA and UVB 30 SPF or higher sunscreen Avoiding sunburns Limiting sun exposure, especially during the hours of 11am-4pm  Wearing protective clothing and sunglasses Avoid using tanning beds For more information about the prevention of melanoma visit [email or web address]   Prostate Cancer Screening: Annual digital rectal exam (DRE) at age 62 Annual PSA blood test at age 41  Pancreatic Cancer Screening/Risk Reduction: Avoid smoking, heavy alcohol use, and obesity. It has been suggested that pancreatic cancer screening be limited to those with a family history of pancreatic cancer (first- or second-degree relative).  Ideally, screening should be performed in experienced centers utilizing a multidisciplinary  approach under research conditions. Recommended screening could include annual endoscopic ultrasound (preferred) and/or MRI of the pancreas starting at age 82 or 43 years younger than the earliest age diagnosis in the family. CA19-9 testing may be considered based on the physician's discretion.  Additional Considerations: Individuals at risk for developing breast and ovarian cancer may benefit from the use of medication to reduce their risk for cancer. These medications are referred to as chemoprevention. For example, oral contraceptive use has been shown to reduce the risk of ovarian cancer by approximately 60% in BRCA1 mutation carriers if taken for at least 5 years. This risk reduction remains even after discontinuation of oral contraceptives. Recent studies have suggested PARP inhibitors may be a beneficial chemotherapeutic agent for a subset of patients with BRCA1-associated breast, ovarian, prostate, and pancreatic cancers. Clinical trials are currently in process to determine if and how these agents can be useful in the treatment of BRCA1 cancer patients. Patients of reproductive age should be made aware of options for prenatal diagnosis and assisted reproduction including pre-implantation genetic diagnosis.   This information is based on current understanding of the gene and may change in the future.   Implications for Family Members: Hereditary predisposition to cancer due to pathogenic variants in the BRCA1 gene has autosomal dominant inheritance. This means that an individual with a pathogenic variant has a 50% chance of passing the condition on to his/her offspring. Identification of a pathogenic variant allows for the recognition of at-risk relatives who can pursue testing for the familial variant.  Family members are encouraged to consider genetic testing for this familial pathogenic variant. As there are generally no childhood cancer risks associated with pathogenic variants in the  BRCA1 gene, individuals in the family are not recommended to have testing until they reach at least 69 years of age. They may contact our office at [phone] for more information or to schedule an appointment.  Complimentary testing for the familial variant is available for 90 days.  Family members who live outside of the area are encouraged to find a genetic counselor in their area by visiting: [email or web address].  Resources: FORCE (Facing Our Risk of Cancer Empowered) is a resource for those with a hereditary predisposition to develop cancer.  FORCE provides information about risk reduction, advocacy, legislation, and clinical trials.  Additionally, [employer]CE provides a platform for collaboration and support; which includes: peer navigation, message boards, local support groups, a toll-free helpline, research registry and recruitment, advocate training, published medical research, webinars, brochures, mastectomy photos, and more.  For more information, visit www.facingourrisk.org   Our contact number was provided. [name] questions were answered to his satisfaction, and he knows he is welcome to call us at anytime with additional questions or concerns.   Lucille Passy, MS, [facility] Genetic Counselor [email or web address] (P) [phone]

## 2021-07-03 ENCOUNTER — Inpatient Hospital Stay: Payer: Medicare HMO

## 2021-07-03 ENCOUNTER — Inpatient Hospital Stay: Payer: Medicare HMO | Admitting: Nurse Practitioner

## 2021-07-04 ENCOUNTER — Inpatient Hospital Stay: Payer: Medicare HMO | Attending: Oncology

## 2021-07-04 ENCOUNTER — Other Ambulatory Visit: Payer: Self-pay

## 2021-07-04 VITALS — BP 120/72 | HR 58 | Temp 97.1°F | Resp 20

## 2021-07-04 DIAGNOSIS — K219 Gastro-esophageal reflux disease without esophagitis: Secondary | ICD-10-CM | POA: Diagnosis not present

## 2021-07-04 DIAGNOSIS — D6959 Other secondary thrombocytopenia: Secondary | ICD-10-CM | POA: Diagnosis not present

## 2021-07-04 DIAGNOSIS — R748 Abnormal levels of other serum enzymes: Secondary | ICD-10-CM | POA: Insufficient documentation

## 2021-07-04 DIAGNOSIS — Z79899 Other long term (current) drug therapy: Secondary | ICD-10-CM | POA: Insufficient documentation

## 2021-07-04 DIAGNOSIS — T451X5A Adverse effect of antineoplastic and immunosuppressive drugs, initial encounter: Secondary | ICD-10-CM | POA: Insufficient documentation

## 2021-07-04 DIAGNOSIS — I959 Hypotension, unspecified: Secondary | ICD-10-CM | POA: Diagnosis not present

## 2021-07-04 DIAGNOSIS — E785 Hyperlipidemia, unspecified: Secondary | ICD-10-CM | POA: Insufficient documentation

## 2021-07-04 DIAGNOSIS — C772 Secondary and unspecified malignant neoplasm of intra-abdominal lymph nodes: Secondary | ICD-10-CM | POA: Diagnosis present

## 2021-07-04 DIAGNOSIS — Z5189 Encounter for other specified aftercare: Secondary | ICD-10-CM | POA: Insufficient documentation

## 2021-07-04 DIAGNOSIS — C250 Malignant neoplasm of head of pancreas: Secondary | ICD-10-CM | POA: Diagnosis present

## 2021-07-04 DIAGNOSIS — Z5111 Encounter for antineoplastic chemotherapy: Secondary | ICD-10-CM | POA: Diagnosis present

## 2021-07-04 DIAGNOSIS — D701 Agranulocytosis secondary to cancer chemotherapy: Secondary | ICD-10-CM | POA: Diagnosis not present

## 2021-07-04 MED ORDER — SODIUM CHLORIDE 0.9% FLUSH
10.0000 mL | INTRAVENOUS | Status: DC | PRN
  Administered 2021-07-04: 10 mL

## 2021-07-04 MED ORDER — HEPARIN SOD (PORK) LOCK FLUSH 100 UNIT/ML IV SOLN
500.0000 [IU] | Freq: Once | INTRAVENOUS | Status: AC | PRN
  Administered 2021-07-04: 500 [IU]

## 2021-07-04 NOTE — Progress Notes (Signed)
Patient stated that he had intense tingling in his bilateral jaws that made eating difficult with this treatment. It started on Monday 10/31 and has lasted through today 07/04/2021. I let patient know that it was probably the oxaliplatin that was causing it. I also let patient know that I would let Dr. Benay Spice know. Patient had an episode of diarrhea that resolved with an antidiarrheal. Otherwise, patient is doing good.

## 2021-07-04 NOTE — Patient Instructions (Signed)
[facility]  Discharge Instructions: Thank you for choosing [facility] to provide your oncology and hematology care.   If you have a lab appointment with the [facility], please go directly to the [facility] and check in at the registration area.   Wear comfortable clothing and clothing appropriate for easy access to any Portacath or PICC line.   We strive to give you quality time with your provider. You may need to reschedule your appointment if you arrive late (15 or more minutes).  Arriving late affects you and other patients whose appointments are after yours.  Also, if you miss three or more appointments without notifying the office, you may be dismissed from the clinic at the provider's discretion.      For prescription refill requests, have your pharmacy contact our office and allow 72 hours for refills to be completed.    Today you received the following chemotherapy and/or immunotherapy agents 72f  Fluorouracil, 5-FU injection What is this medication? FLUOROURACIL, 5-FU (flure oh YOOR a sil) is a chemotherapy drug. It slows the growth of cancer cells. This medicine is used to treat many types of cancer like breast cancer, colon or rectal cancer, pancreatic cancer, and stomach cancer. This medicine may be used for other purposes; ask your health care provider or pharmacist if you have questions. COMMON BRAND NAME(S): Adrucil What should I tell my care team before I take this medication? They need to know if you have any of these conditions: blood disorders dihydropyrimidine dehydrogenase (DPD) deficiency infection (especially a virus infection such as chickenpox, cold sores, or herpes) kidney disease liver disease malnourished, poor nutrition recent or ongoing radiation therapy an unusual or allergic reaction to fluorouracil, other chemotherapy, other medicines, foods, dyes, or preservatives pregnant or trying to get  pregnant breast-feeding How should I use this medication? This drug is given as an infusion or injection into a vein. It is administered in a hospital or clinic by a specially trained health care professional. Talk to your pediatrician regarding the use of this medicine in children. Special care may be needed. Overdosage: If you think you have taken too much of this medicine contact a poison control center or emergency room at once. NOTE: This medicine is only for you. Do not share this medicine with others. What if I miss a dose? It is important not to miss your dose. Call your doctor or health care professional if you are unable to keep an appointment. What may interact with this medication? Do not take this medicine with any of the following medications: live virus vaccines This medicine may also interact with the following medications: medicines that treat or prevent blood clots like warfarin, enoxaparin, and dalteparin This list may not describe all possible interactions. Give your health care provider a list of all the medicines, herbs, non-prescription drugs, or dietary supplements you use. Also tell them if you smoke, drink alcohol, or use illegal drugs. Some items may interact with your medicine. What should I watch for while using this medication? Visit your doctor for checks on your progress. This drug may make you feel generally unwell. This is not uncommon, as chemotherapy can affect healthy cells as well as cancer cells. Report any side effects. Continue your course of treatment even though you feel ill unless your doctor tells you to stop. In some cases, you may be given additional medicines to help with side effects. Follow all directions for their use. Call your  doctor or health care professional for advice if you get a fever, chills or sore throat, or other symptoms of a cold or flu. Do not treat yourself. This drug decreases your body's ability to fight infections. Try to avoid  being around people who are sick. This medicine may increase your risk to bruise or bleed. Call your doctor or health care professional if you notice any unusual bleeding. Be careful brushing and flossing your teeth or using a toothpick because you may get an infection or bleed more easily. If you have any dental work done, tell your dentist you are receiving this medicine. Avoid taking products that contain aspirin, acetaminophen, ibuprofen, naproxen, or ketoprofen unless instructed by your doctor. These medicines may hide a fever. Do not become pregnant while taking this medicine. Women should inform their doctor if they wish to become pregnant or think they might be pregnant. There is a potential for serious side effects to an unborn child. Talk to your health care professional or pharmacist for more information. Do not breast-feed an infant while taking this medicine. Men should inform their doctor if they wish to father a child. This medicine may lower sperm counts. Do not treat diarrhea with over the counter products. Contact your doctor if you have diarrhea that lasts more than 2 days or if it is severe and watery. This medicine can make you more sensitive to the sun. Keep out of the sun. If you cannot avoid being in the sun, wear protective clothing and use sunscreen. Do not use sun lamps or tanning beds/booths. What side effects may I notice from receiving this medication? Side effects that you should report to your doctor or health care professional as soon as possible: allergic reactions like skin rash, itching or hives, swelling of the face, lips, or tongue low blood counts - this medicine may decrease the number of white blood cells, red blood cells and platelets. You may be at increased risk for infections and bleeding. signs of infection - fever or chills, cough, sore throat, pain or difficulty passing urine signs of decreased platelets or bleeding - bruising, pinpoint red spots on the  skin, black, tarry stools, blood in the urine signs of decreased red blood cells - unusually weak or tired, fainting spells, lightheadedness breathing problems changes in vision chest pain mouth sores nausea and vomiting pain, swelling, redness at site where injected pain, tingling, numbness in the hands or feet redness, swelling, or sores on hands or feet stomach pain unusual bleeding Side effects that usually do not require medical attention (report to your doctor or health care professional if they continue or are bothersome): changes in finger or toe nails diarrhea dry or itchy skin hair loss headache loss of appetite sensitivity of eyes to the light stomach upset unusually teary eyes This list may not describe all possible side effects. Call your doctor for medical advice about side effects. You may report side effects to FDA at 1-800-FDA-1088. Where should I keep my medication? This drug is given in a hospital or clinic and will not be stored at home. NOTE: This sheet is a summary. It may not cover all possible information. If you have questions about this medicine, talk to your doctor, pharmacist, or health care provider.  2022 Elsevier/Gold Standard (2019-07-20 15:00:03)       To help prevent nausea and vomiting after your treatment, we encourage you to take your nausea medication as directed.  BELOW ARE SYMPTOMS THAT SHOULD BE REPORTED IMMEDIATELY: *FEVER  GREATER THAN 100.4 F (38 C) OR HIGHER *CHILLS OR SWEATING *NAUSEA AND VOMITING THAT IS NOT CONTROLLED WITH YOUR NAUSEA MEDICATION *UNUSUAL SHORTNESS OF BREATH *UNUSUAL BRUISING OR BLEEDING *URINARY PROBLEMS (pain or burning when urinating, or frequent urination) *BOWEL PROBLEMS (unusual diarrhea, constipation, pain near the anus) TENDERNESS IN MOUTH AND THROAT WITH OR WITHOUT PRESENCE OF ULCERS (sore throat, sores in mouth, or a toothache) UNUSUAL RASH, SWELLING OR PAIN  UNUSUAL VAGINAL DISCHARGE OR ITCHING    Items with * indicate a potential emergency and should be followed up as soon as possible or go to the Emergency Department if any problems should occur.  Please show the CHEMOTHERAPY ALERT CARD or IMMUNOTHERAPY ALERT CARD at check-in to the Emergency Department and triage nurse.  Should you have questions after your visit or need to cancel or reschedule your appointment, please contact [facility]  Dept: [phone]  and follow the prompts.  Office hours are 8:00 a.m. to 4:30 p.m. Monday - Friday. Please note that voicemails left after 4:00 p.m. may not be returned until the following business day.  We are closed weekends and major holidays. You have access to a nurse at all times for urgent questions. Please call the main number to the clinic Dept: [phone] and follow the prompts.   For any non-urgent questions, you may also contact your provider using MyChart. We now offer e-Visits for anyone 65 and older to request care online for non-urgent symptoms. For details visit [email or web address].   Also download the MyChart app! Go to the app store, search "MyChart", open the app, select [facility], and log in with your MyChart username and password.  Due to Covid, a mask is required upon entering the hospital/clinic. If you do not have a mask, one will be given to you upon arrival. For doctor visits, patients may have 1 support person aged 31 or older with them. For treatment visits, patients cannot have anyone with them due to current Covid guidelines and our immunocompromised population.

## 2021-07-05 ENCOUNTER — Encounter: Payer: Self-pay | Admitting: Genetic Counselor

## 2021-07-05 DIAGNOSIS — H5213 Myopia, bilateral: Secondary | ICD-10-CM | POA: Diagnosis not present

## 2021-07-05 DIAGNOSIS — Z0100 Encounter for examination of eyes and vision without abnormal findings: Secondary | ICD-10-CM | POA: Diagnosis not present

## 2021-07-05 DIAGNOSIS — H31003 Unspecified chorioretinal scars, bilateral: Secondary | ICD-10-CM | POA: Diagnosis not present

## 2021-07-06 ENCOUNTER — Encounter: Payer: Self-pay | Admitting: *Deleted

## 2021-07-06 NOTE — Progress Notes (Signed)
PATIENT NAVIGATOR PROGRESS NOTE  Name: David Jarvis Date: 07/06/2021 MRN: [id number]  DOB: [birth year]   Reason for visit:  F/U call after 2nd treatment cycle  Comments:  Patient states that he is doing well, feeling good and able to have a normal week. He completes day 3 of Dexamethasone tonight. No further issues or questions    Time spent counseling/coordinating care: 15-30 minutes

## 2021-07-09 ENCOUNTER — Other Ambulatory Visit: Payer: Self-pay

## 2021-07-09 ENCOUNTER — Other Ambulatory Visit: Payer: Self-pay | Admitting: Nurse Practitioner

## 2021-07-09 ENCOUNTER — Other Ambulatory Visit: Payer: Self-pay | Admitting: *Deleted

## 2021-07-09 ENCOUNTER — Encounter: Payer: Self-pay | Admitting: *Deleted

## 2021-07-09 ENCOUNTER — Inpatient Hospital Stay: Payer: Medicare HMO

## 2021-07-09 ENCOUNTER — Other Ambulatory Visit (HOSPITAL_BASED_OUTPATIENT_CLINIC_OR_DEPARTMENT_OTHER): Payer: Self-pay

## 2021-07-09 DIAGNOSIS — C250 Malignant neoplasm of head of pancreas: Secondary | ICD-10-CM

## 2021-07-09 DIAGNOSIS — Z5189 Encounter for other specified aftercare: Secondary | ICD-10-CM | POA: Diagnosis not present

## 2021-07-09 DIAGNOSIS — T451X5A Adverse effect of antineoplastic and immunosuppressive drugs, initial encounter: Secondary | ICD-10-CM | POA: Diagnosis not present

## 2021-07-09 DIAGNOSIS — D701 Agranulocytosis secondary to cancer chemotherapy: Secondary | ICD-10-CM | POA: Diagnosis not present

## 2021-07-09 DIAGNOSIS — C772 Secondary and unspecified malignant neoplasm of intra-abdominal lymph nodes: Secondary | ICD-10-CM | POA: Diagnosis not present

## 2021-07-09 DIAGNOSIS — D6959 Other secondary thrombocytopenia: Secondary | ICD-10-CM | POA: Diagnosis not present

## 2021-07-09 DIAGNOSIS — E785 Hyperlipidemia, unspecified: Secondary | ICD-10-CM | POA: Diagnosis not present

## 2021-07-09 DIAGNOSIS — Z5111 Encounter for antineoplastic chemotherapy: Secondary | ICD-10-CM | POA: Diagnosis not present

## 2021-07-09 DIAGNOSIS — I959 Hypotension, unspecified: Secondary | ICD-10-CM | POA: Diagnosis not present

## 2021-07-09 DIAGNOSIS — R748 Abnormal levels of other serum enzymes: Secondary | ICD-10-CM | POA: Diagnosis not present

## 2021-07-09 LAB — URINALYSIS, COMPLETE (UACMP) WITH MICROSCOPIC
Bilirubin Urine: NEGATIVE
Glucose, UA: NEGATIVE mg/dL
Hgb urine dipstick: NEGATIVE
Ketones, ur: NEGATIVE mg/dL
Nitrite: NEGATIVE
Protein, ur: NEGATIVE mg/dL
Specific Gravity, Urine: 1.024 (ref 1.005–1.030)
pH: 5.5 (ref 5.0–8.0)

## 2021-07-09 MED ORDER — SULFAMETHOXAZOLE-TRIMETHOPRIM 800-160 MG PO TABS
1.0000 | ORAL_TABLET | Freq: Two times a day (BID) | ORAL | 0 refills | Status: AC
Start: 2021-07-09 — End: 2021-07-16
  Filled 2021-07-09: qty 14, 7d supply, fill #0

## 2021-07-09 NOTE — Progress Notes (Signed)
Pt called with c/o frequent and painful urination for past 2 days, up several times  night and urinating small amounts. Describes fatigue, no fever/chills, tolerating liquids and diet.  Instructed patient to come in for urinalysis and urine culture

## 2021-07-09 NOTE — Progress Notes (Signed)
PATIENT NAVIGATOR PROGRESS NOTE  Name: David Jarvis Date: 07/09/2021 MRN: [id number]  DOB: April 25, 1951   Reason for visit:  Results from U/A  Comments:  Called patient with U/A results that he has a UTI, Bactrim DS called in per Ned Card NP and patient to start today. Also instructed patient to push oral fluids as well    Time spent counseling/coordinating care: < 15 minutes

## 2021-07-10 LAB — URINE CULTURE: Culture: NO GROWTH

## 2021-07-12 ENCOUNTER — Other Ambulatory Visit: Payer: Self-pay

## 2021-07-12 DIAGNOSIS — C250 Malignant neoplasm of head of pancreas: Secondary | ICD-10-CM

## 2021-07-15 ENCOUNTER — Other Ambulatory Visit: Payer: Self-pay | Admitting: Oncology

## 2021-07-16 ENCOUNTER — Inpatient Hospital Stay: Payer: Medicare HMO

## 2021-07-16 ENCOUNTER — Encounter: Payer: Self-pay | Admitting: Nurse Practitioner

## 2021-07-16 ENCOUNTER — Inpatient Hospital Stay: Payer: Medicare HMO | Admitting: Nurse Practitioner

## 2021-07-16 ENCOUNTER — Other Ambulatory Visit: Payer: Self-pay

## 2021-07-16 VITALS — BP 98/68 | HR 66 | Temp 97.8°F | Resp 18 | Ht 66.0 in | Wt 152.2 lb

## 2021-07-16 DIAGNOSIS — C250 Malignant neoplasm of head of pancreas: Secondary | ICD-10-CM

## 2021-07-16 DIAGNOSIS — R748 Abnormal levels of other serum enzymes: Secondary | ICD-10-CM | POA: Diagnosis not present

## 2021-07-16 DIAGNOSIS — C772 Secondary and unspecified malignant neoplasm of intra-abdominal lymph nodes: Secondary | ICD-10-CM | POA: Diagnosis not present

## 2021-07-16 DIAGNOSIS — D6959 Other secondary thrombocytopenia: Secondary | ICD-10-CM | POA: Diagnosis not present

## 2021-07-16 DIAGNOSIS — Z5189 Encounter for other specified aftercare: Secondary | ICD-10-CM | POA: Diagnosis not present

## 2021-07-16 DIAGNOSIS — E785 Hyperlipidemia, unspecified: Secondary | ICD-10-CM | POA: Diagnosis not present

## 2021-07-16 DIAGNOSIS — T451X5A Adverse effect of antineoplastic and immunosuppressive drugs, initial encounter: Secondary | ICD-10-CM | POA: Diagnosis not present

## 2021-07-16 DIAGNOSIS — Z5111 Encounter for antineoplastic chemotherapy: Secondary | ICD-10-CM | POA: Diagnosis not present

## 2021-07-16 DIAGNOSIS — I959 Hypotension, unspecified: Secondary | ICD-10-CM | POA: Diagnosis not present

## 2021-07-16 DIAGNOSIS — D701 Agranulocytosis secondary to cancer chemotherapy: Secondary | ICD-10-CM | POA: Diagnosis not present

## 2021-07-16 LAB — CMP (CANCER CENTER ONLY)
ALT: 16 U/L (ref 0–44)
AST: 14 U/L — ABNORMAL LOW (ref 15–41)
Albumin: 3.7 g/dL (ref 3.5–5.0)
Alkaline Phosphatase: 73 U/L (ref 38–126)
Anion gap: 9 (ref 5–15)
BUN: 17 mg/dL (ref 8–23)
CO2: 25 mmol/L (ref 22–32)
Calcium: 8.7 mg/dL — ABNORMAL LOW (ref 8.9–10.3)
Chloride: 104 mmol/L (ref 98–111)
Creatinine: 1.08 mg/dL (ref 0.61–1.24)
GFR, Estimated: >60 mL/min (ref >60)
Glucose, Bld: 103 mg/dL — ABNORMAL HIGH (ref 70–99)
Potassium: 4 mmol/L (ref 3.5–5.1)
Sodium: 138 mmol/L (ref 135–145)
Total Bilirubin: 0.3 mg/dL (ref 0.3–1.2)
Total Protein: 6 g/dL — ABNORMAL LOW (ref 6.5–8.1)

## 2021-07-16 LAB — CBC WITH DIFFERENTIAL (CANCER CENTER ONLY)
Abs Immature Granulocytes: 0.02 10*3/uL (ref 0.00–0.07)
Basophils Absolute: 0 10*3/uL (ref 0.0–0.1)
Basophils Relative: 1 %
Eosinophils Absolute: 0 10*3/uL (ref 0.0–0.5)
Eosinophils Relative: 1 %
HCT: 31.7 % — ABNORMAL LOW (ref 39.0–52.0)
Hemoglobin: 10.5 g/dL — ABNORMAL LOW (ref 13.0–17.0)
Immature Granulocytes: 1 %
Lymphocytes Relative: 55 %
Lymphs Abs: 1.3 10*3/uL (ref 0.7–4.0)
MCH: 28.9 pg (ref 26.0–34.0)
MCHC: 33.1 g/dL (ref 30.0–36.0)
MCV: 87.3 fL (ref 80.0–100.0)
Monocytes Absolute: 0.5 10*3/uL (ref 0.1–1.0)
Monocytes Relative: 22 %
Neutro Abs: 0.5 10*3/uL — ABNORMAL LOW (ref 1.7–7.7)
Neutrophils Relative %: 20 %
Platelet Count: 103 10*3/uL — ABNORMAL LOW (ref 150–400)
RBC: 3.63 MIL/uL — ABNORMAL LOW (ref 4.22–5.81)
RDW: 15.7 % — ABNORMAL HIGH (ref 11.5–15.5)
WBC Count: 2.4 10*3/uL — ABNORMAL LOW (ref 4.0–10.5)
nRBC: 0 % (ref 0.0–0.2)

## 2021-07-16 LAB — MAGNESIUM: Magnesium: 1.6 mg/dL — ABNORMAL LOW (ref 1.7–2.4)

## 2021-07-16 MED ORDER — SODIUM CHLORIDE 0.9% FLUSH
10.0000 mL | Freq: Once | INTRAVENOUS | Status: AC
  Administered 2021-07-16: 10 mL via INTRAVENOUS

## 2021-07-16 MED ORDER — HEPARIN SOD (PORK) LOCK FLUSH 100 UNIT/ML IV SOLN
500.0000 [IU] | Freq: Once | INTRAVENOUS | Status: AC
  Administered 2021-07-16: 500 [IU] via INTRAVENOUS

## 2021-07-16 NOTE — Progress Notes (Signed)
  [facility] OFFICE PROGRESS NOTE   Diagnosis: Pancreas cancer  INTERVAL HISTORY:   Mr. Muzzy returns as scheduled.  He completed cycle 2 FOLFIRINOX 07/02/2021.  Irinotecan and 5-FU dose reduced.  Prophylactic dexamethasone beginning day of pump discontinuation.  He did not have significant nausea.  No mouth sores.  Intermittent loose stools.  Cold sensitivity lasted "several days".  He had jaw pain.  Intermittent lightheadedness and dizziness for several days after the chemotherapy.  He thinks fluid intake has been adequate.  Objective:  Vital signs in last 24 hours:  Blood pressure 98/68, pulse 66, temperature 97.8 F (36.6 C), temperature source Oral, resp. rate 18, height 5\' 6"  (1.676 m), weight 152 lb 3.2 oz (69 kg), SpO2 100 %.    HEENT: No thrush or ulcers.  Mucous membranes appear moist. Resp: Lungs clear bilaterally. Cardio: Regular rate and rhythm. GI: Abdomen soft and nontender.  No hepatomegaly. Vascular: No leg edema. Skin: Skin turgor intact. Port-A-Cath without erythema   Lab Results:  Lab Results  Component Value Date   WBC 2.4 (L) 07/16/2021   HGB 10.5 (L) 07/16/2021   HCT 31.7 (L) 07/16/2021   MCV 87.3 07/16/2021   PLT 103 (L) 07/16/2021   NEUTROABS PENDING 07/16/2021    Imaging:  No results found.  Medications: I have reviewed the patient's current medications.  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, stent placed EUS 05/23/2021-25 x 15 mm pancreas head mass with irregular borders, no lymphadenopathy, no vascular involvement, T2 N0 by ultrasound CT chest 06/01/2021-negative for metastatic disease CT abdomen pancreas protocol 06/01/2021-3.2 x 2.3 cm hypoenhancing pancreas head mass inseparable from the third  portion of the duodenum, 1 cm left retroperitoneal lymph node, no vascular involvement Cycle 1 FOLFIRINOX 06/19/2021 Cycle 2 FOLFIRINOX 07/02/2021, irinotecan and 5-FU dose reduced, prophylactic dexamethasone added beginning day of pump discontinuation Obstructive jaundice secondary #1 Hypertension Admission in February 2022 with elevated liver enzymes MRI/MRCP-intra and extrahepatic biliary duct dilation, tapering smoothly to the ampulla, no mass or filling defect ERCP 10/06/2020-dilated main bile duct, biliary tree was swept and nothing was found, cytology from bile duct brushing-no malignant cells identified 5.  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  GERD 7.  Squamous cell carcinoma left forearm 2014 8.  Hyperlipidemia  Disposition: Mr. Beem appears stable.  He has completed 2 cycles of FOLFIRINOX.  CBC from today shows neutropenia and thrombocytopenia.  We are holding treatment and rescheduling for 1 week.  Precautions reviewed.  He understands to contact the also fever, chills, other signs of infection, bleeding.  Plan for white cell growth factor support on the day of pump discontinuation.  Potential toxicities reviewed including bone pain, rash, splenic rupture.  He agrees with the plan.  He has mild hypotension.  He will place valsartan/HCTZ on hold.  He will monitor his blood pressure at home.  He will return for lab, follow-up, FOLFIRINOX in 1 week.  He will contact the office in the interim as outlined above or with any other problems.    Ned Card ANP/GNP-BC   07/16/2021  9:02 AM

## 2021-07-16 NOTE — Addendum Note (Signed)
Addended by: Gerhard Perches on: 07/16/2021 09:22 AM   Modules accepted: Orders

## 2021-07-18 ENCOUNTER — Telehealth: Payer: Self-pay | Admitting: Nutrition

## 2021-07-18 ENCOUNTER — Inpatient Hospital Stay: Payer: Medicare HMO

## 2021-07-18 ENCOUNTER — Inpatient Hospital Stay: Payer: Medicare HMO | Admitting: Nutrition

## 2021-07-18 NOTE — Telephone Encounter (Signed)
Patient was scheduled for a nutrition appointment. He did not come in to the cancer center. I called him to see if we could follow up by phone however, he was unavailable. I left my name and contact number for return call.

## 2021-07-20 DIAGNOSIS — C250 Malignant neoplasm of head of pancreas: Secondary | ICD-10-CM | POA: Diagnosis not present

## 2021-07-23 ENCOUNTER — Inpatient Hospital Stay: Payer: Medicare HMO

## 2021-07-23 ENCOUNTER — Inpatient Hospital Stay (HOSPITAL_BASED_OUTPATIENT_CLINIC_OR_DEPARTMENT_OTHER): Payer: Medicare HMO | Admitting: Nurse Practitioner

## 2021-07-23 ENCOUNTER — Other Ambulatory Visit: Payer: Self-pay

## 2021-07-23 ENCOUNTER — Encounter: Payer: Self-pay | Admitting: Nurse Practitioner

## 2021-07-23 VITALS — BP 128/77 | HR 56 | Temp 97.5°F | Resp 18 | Ht 66.0 in | Wt 157.8 lb

## 2021-07-23 DIAGNOSIS — I959 Hypotension, unspecified: Secondary | ICD-10-CM | POA: Diagnosis not present

## 2021-07-23 DIAGNOSIS — C250 Malignant neoplasm of head of pancreas: Secondary | ICD-10-CM

## 2021-07-23 DIAGNOSIS — D701 Agranulocytosis secondary to cancer chemotherapy: Secondary | ICD-10-CM | POA: Diagnosis not present

## 2021-07-23 DIAGNOSIS — Z5189 Encounter for other specified aftercare: Secondary | ICD-10-CM | POA: Diagnosis not present

## 2021-07-23 DIAGNOSIS — Z5111 Encounter for antineoplastic chemotherapy: Secondary | ICD-10-CM | POA: Diagnosis not present

## 2021-07-23 DIAGNOSIS — D6959 Other secondary thrombocytopenia: Secondary | ICD-10-CM | POA: Diagnosis not present

## 2021-07-23 DIAGNOSIS — E785 Hyperlipidemia, unspecified: Secondary | ICD-10-CM | POA: Diagnosis not present

## 2021-07-23 DIAGNOSIS — T451X5A Adverse effect of antineoplastic and immunosuppressive drugs, initial encounter: Secondary | ICD-10-CM | POA: Diagnosis not present

## 2021-07-23 DIAGNOSIS — R748 Abnormal levels of other serum enzymes: Secondary | ICD-10-CM | POA: Diagnosis not present

## 2021-07-23 DIAGNOSIS — C772 Secondary and unspecified malignant neoplasm of intra-abdominal lymph nodes: Secondary | ICD-10-CM | POA: Diagnosis not present

## 2021-07-23 LAB — CBC WITH DIFFERENTIAL (CANCER CENTER ONLY)
Abs Immature Granulocytes: 0.07 10*3/uL (ref 0.00–0.07)
Basophils Absolute: 0.1 10*3/uL (ref 0.0–0.1)
Basophils Relative: 1 %
Eosinophils Absolute: 0 10*3/uL (ref 0.0–0.5)
Eosinophils Relative: 1 %
HCT: 32.5 % — ABNORMAL LOW (ref 39.0–52.0)
Hemoglobin: 10.7 g/dL — ABNORMAL LOW (ref 13.0–17.0)
Immature Granulocytes: 1 %
Lymphocytes Relative: 33 %
Lymphs Abs: 1.7 10*3/uL (ref 0.7–4.0)
MCH: 29.1 pg (ref 26.0–34.0)
MCHC: 32.9 g/dL (ref 30.0–36.0)
MCV: 88.3 fL (ref 80.0–100.0)
Monocytes Absolute: 0.7 10*3/uL (ref 0.1–1.0)
Monocytes Relative: 13 %
Neutro Abs: 2.6 10*3/uL (ref 1.7–7.7)
Neutrophils Relative %: 51 %
Platelet Count: 152 10*3/uL (ref 150–400)
RBC: 3.68 MIL/uL — ABNORMAL LOW (ref 4.22–5.81)
RDW: 16.8 % — ABNORMAL HIGH (ref 11.5–15.5)
WBC Count: 5.1 10*3/uL (ref 4.0–10.5)
nRBC: 0 % (ref 0.0–0.2)

## 2021-07-23 LAB — CMP (CANCER CENTER ONLY)
ALT: 22 U/L (ref 0–44)
AST: 20 U/L (ref 15–41)
Albumin: 3.8 g/dL (ref 3.5–5.0)
Alkaline Phosphatase: 81 U/L (ref 38–126)
Anion gap: 8 (ref 5–15)
BUN: 12 mg/dL (ref 8–23)
CO2: 27 mmol/L (ref 22–32)
Calcium: 9 mg/dL (ref 8.9–10.3)
Chloride: 106 mmol/L (ref 98–111)
Creatinine: 1.02 mg/dL (ref 0.61–1.24)
GFR, Estimated: >60 mL/min (ref >60)
Glucose, Bld: 79 mg/dL (ref 70–99)
Potassium: 4.4 mmol/L (ref 3.5–5.1)
Sodium: 141 mmol/L (ref 135–145)
Total Bilirubin: 0.5 mg/dL (ref 0.3–1.2)
Total Protein: 6 g/dL — ABNORMAL LOW (ref 6.5–8.1)

## 2021-07-23 LAB — MAGNESIUM: Magnesium: 1.8 mg/dL (ref 1.7–2.4)

## 2021-07-23 MED ORDER — DEXTROSE 5 % IV SOLN: Freq: Once | INTRAVENOUS | Status: AC

## 2021-07-23 MED ORDER — SODIUM CHLORIDE 0.9 % IV SOLN
2000.0000 mg/m2 | INTRAVENOUS | Status: DC
  Administered 2021-07-23: 3650 mg via INTRAVENOUS
  Filled 2021-07-23: qty 73

## 2021-07-23 MED ORDER — PALONOSETRON HCL INJECTION 0.25 MG/5ML
0.2500 mg | Freq: Once | INTRAVENOUS | Status: AC
  Administered 2021-07-23: 0.25 mg via INTRAVENOUS
  Filled 2021-07-23: qty 5

## 2021-07-23 MED ORDER — ATROPINE SULFATE 1 MG/ML IV SOLN
0.5000 mg | Freq: Once | INTRAVENOUS | Status: AC | PRN
  Administered 2021-07-23: 0.5 mg via INTRAVENOUS
  Filled 2021-07-23: qty 1

## 2021-07-23 MED ORDER — SODIUM CHLORIDE 0.9 % IV SOLN
120.0000 mg/m2 | Freq: Once | INTRAVENOUS | Status: AC
  Administered 2021-07-23: 220 mg via INTRAVENOUS
  Filled 2021-07-23: qty 11

## 2021-07-23 MED ORDER — OXALIPLATIN CHEMO INJECTION 100 MG/20ML
150.0000 mg | Freq: Once | INTRAVENOUS | Status: AC
  Administered 2021-07-23: 150 mg via INTRAVENOUS
  Filled 2021-07-23: qty 20

## 2021-07-23 MED ORDER — SODIUM CHLORIDE 0.9 % IV SOLN
10.0000 mg | Freq: Once | INTRAVENOUS | Status: AC
  Administered 2021-07-23: 10 mg via INTRAVENOUS
  Filled 2021-07-23: qty 1

## 2021-07-23 MED ORDER — SODIUM CHLORIDE 0.9 % IV SOLN
150.0000 mg | Freq: Once | INTRAVENOUS | Status: AC
  Administered 2021-07-23: 150 mg via INTRAVENOUS
  Filled 2021-07-23: qty 5

## 2021-07-23 MED ORDER — SODIUM CHLORIDE 0.9 % IV SOLN
400.0000 mg/m2 | Freq: Once | INTRAVENOUS | Status: AC
  Administered 2021-07-23: 728 mg via INTRAVENOUS
  Filled 2021-07-23: qty 36.4

## 2021-07-23 NOTE — Patient Instructions (Addendum)
The chemotherapy medication bag should finish at 46 hours, 96 hours, or 7 days. For example, if your pump is scheduled for 46 hours and it was put on at 4:00 p.m., it should finish at 2:00 p.m. the day it is scheduled to come off regardless of your appointment time.     Estimated time to finish at 2:15  Wednesday, July 25, 2021.   If the display on your pump reads "Low Volume" and it is beeping, take the batteries out of the pump and come to the cancer center for it to be taken off.   If the pump alarms go off prior to the pump reading "Low Volume" then call [phone] and someone can assist you.  If the plunger comes out and the chemotherapy medication is leaking out, please use your home chemo spill kit to clean up the spill. Do NOT use paper towels or other household products.  If you have problems or questions regarding your pump, please call either [phone] (24 hours a day) or the cancer center Monday-Friday 8:00 a.m.- 4:30 p.m. at the clinic number and we will assist you. If you are unable to get assistance, then go to the nearest Emergency Department and ask the staff to contact the IV team for assistance.  [facility]   Discharge Instructions: Thank you for choosing [facility] to provide your oncology and hematology care.   If you have a lab appointment with the [facility], please go directly to the [facility] and check in at the registration area.   Wear comfortable clothing and clothing appropriate for easy access to any Portacath or PICC line.   We strive to give you quality time with your provider. You may need to reschedule your appointment if you arrive late (15 or more minutes).  Arriving late affects you and other patients whose appointments are after yours.  Also, if you miss three or more appointments without notifying the office, you may be dismissed from the clinic at the provider's discretion.      For  prescription refill requests, have your pharmacy contact our office and allow 72 hours for refills to be completed.    Today you received the following chemotherapy and/or immunotherapy agents oxaliplatin, leucovorin, irinotecan, fluorouracil      To help prevent nausea and vomiting after your treatment, we encourage you to take your nausea medication as directed.  BELOW ARE SYMPTOMS THAT SHOULD BE REPORTED IMMEDIATELY: *FEVER GREATER THAN 100.4 F (38 C) OR HIGHER *CHILLS OR SWEATING *NAUSEA AND VOMITING THAT IS NOT CONTROLLED WITH YOUR NAUSEA MEDICATION *UNUSUAL SHORTNESS OF BREATH *UNUSUAL BRUISING OR BLEEDING *URINARY PROBLEMS (pain or burning when urinating, or frequent urination) *BOWEL PROBLEMS (unusual diarrhea, constipation, pain near the anus) TENDERNESS IN MOUTH AND THROAT WITH OR WITHOUT PRESENCE OF ULCERS (sore throat, sores in mouth, or a toothache) UNUSUAL RASH, SWELLING OR PAIN  UNUSUAL VAGINAL DISCHARGE OR ITCHING   Items with * indicate a potential emergency and should be followed up as soon as possible or go to the Emergency Department if any problems should occur.  Please show the CHEMOTHERAPY ALERT CARD or IMMUNOTHERAPY ALERT CARD at check-in to the Emergency Department and triage nurse.  Should you have questions after your visit or need to cancel or reschedule your appointment, please contact [facility]  Dept: [phone]  and follow the prompts.  Office hours are 8:00 a.m. to 4:30 p.m. Monday - Friday. Please note  that voicemails left after 4:00 p.m. may not be returned until the following business day.  We are closed weekends and major holidays. You have access to a nurse at all times for urgent questions. Please call the main number to the clinic Dept: [phone] and follow the prompts.   For any non-urgent questions, you may also contact your provider using MyChart. We now offer e-Visits for anyone 53 and older to request care online  for non-urgent symptoms. For details visit [email or web address].   Also download the MyChart app! Go to the app store, search "MyChart", open the app, select [facility], and log in with your MyChart username and password.  Due to Covid, a mask is required upon entering the hospital/clinic. If you do not have a mask, one will be given to you upon arrival. For doctor visits, patients may have 1 support person aged 58 or older with them. For treatment visits, patients cannot have anyone with them due to current Covid guidelines and our immunocompromised population.   Oxaliplatin Injection What is this medication? OXALIPLATIN (ox AL i PLA tin) is a chemotherapy drug. It targets fast dividing cells, like cancer cells, and causes these cells to die. This medicine is used to treat cancers of the colon and rectum, and many other cancers. This medicine may be used for other purposes; ask your health care provider or pharmacist if you have questions. COMMON BRAND NAME(S): Eloxatin What should I tell my care team before I take this medication? They need to know if you have any of these conditions: heart disease history of irregular heartbeat liver disease low blood counts, like white cells, platelets, or red blood cells lung or breathing disease, like asthma take medicines that treat or prevent blood clots tingling of the fingers or toes, or other nerve disorder an unusual or allergic reaction to oxaliplatin, other chemotherapy, other medicines, foods, dyes, or preservatives pregnant or trying to get pregnant breast-feeding How should I use this medication? This drug is given as an infusion into a vein. It is administered in a hospital or clinic by a specially trained health care professional. Talk to your pediatrician regarding the use of this medicine in children. Special care may be needed. Overdosage: If you think you have taken too much of this medicine contact a poison control center or  emergency room at once. NOTE: This medicine is only for you. Do not share this medicine with others. What if I miss a dose? It is important not to miss a dose. Call your doctor or health care professional if you are unable to keep an appointment. What may interact with this medication? Do not take this medicine with any of the following medications: cisapride dronedarone pimozide thioridazine This medicine may also interact with the following medications: aspirin and aspirin-like medicines certain medicines that treat or prevent blood clots like warfarin, apixaban, dabigatran, and rivaroxaban cisplatin cyclosporine diuretics medicines for infection like acyclovir, adefovir, amphotericin B, bacitracin, cidofovir, foscarnet, ganciclovir, gentamicin, pentamidine, vancomycin NSAIDs, medicines for pain and inflammation, like ibuprofen or naproxen other medicines that prolong the QT interval (an abnormal heart rhythm) pamidronate zoledronic acid This list may not describe all possible interactions. Give your health care provider a list of all the medicines, herbs, non-prescription drugs, or dietary supplements you use. Also tell them if you smoke, drink alcohol, or use illegal drugs. Some items may interact with your medicine. What should I watch for while using this medication? Your condition will be monitored carefully while you are  receiving this medicine. You may need blood work done while you are taking this medicine. This medicine may make you feel generally unwell. This is not uncommon as chemotherapy can affect healthy cells as well as cancer cells. Report any side effects. Continue your course of treatment even though you feel ill unless your healthcare professional tells you to stop. This medicine can make you more sensitive to cold. Do not drink cold drinks or use ice. Cover exposed skin before coming in contact with cold temperatures or cold objects. When out in cold weather wear warm  clothing and cover your mouth and nose to warm the air that goes into your lungs. Tell your doctor if you get sensitive to the cold. Do not become pregnant while taking this medicine or for 9 months after stopping it. Women should inform their health care professional if they wish to become pregnant or think they might be pregnant. Men should not father a child while taking this medicine and for 6 months after stopping it. There is potential for serious side effects to an unborn child. Talk to your health care professional for more information. Do not breast-feed a child while taking this medicine or for 3 months after stopping it. This medicine has caused ovarian failure in some women. This medicine may make it more difficult to get pregnant. Talk to your health care professional if you are concerned about your fertility. This medicine has caused decreased sperm counts in some men. This may make it more difficult to father a child. Talk to your health care professional if you are concerned about your fertility. This medicine may increase your risk of getting an infection. Call your health care professional for advice if you get a fever, chills, or sore throat, or other symptoms of a cold or flu. Do not treat yourself. Try to avoid being around people who are sick. Avoid taking medicines that contain aspirin, acetaminophen, ibuprofen, naproxen, or ketoprofen unless instructed by your health care professional. These medicines may hide a fever. Be careful brushing or flossing your teeth or using a toothpick because you may get an infection or bleed more easily. If you have any dental work done, tell your dentist you are receiving this medicine. What side effects may I notice from receiving this medication? Side effects that you should report to your doctor or health care professional as soon as possible: allergic reactions like skin rash, itching or hives, swelling of the face, lips, or tongue breathing  problems cough low blood counts - this medicine may decrease the number of white blood cells, red blood cells, and platelets. You may be at increased risk for infections and bleeding nausea, vomiting pain, redness, or irritation at site where injected pain, tingling, numbness in the hands or feet signs and symptoms of bleeding such as bloody or black, tarry stools; red or dark brown urine; spitting up blood or brown material that looks like coffee grounds; red spots on the skin; unusual bruising or bleeding from the eyes, gums, or nose signs and symptoms of a dangerous change in heartbeat or heart rhythm like chest pain; dizziness; fast, irregular heartbeat; palpitations; feeling faint or lightheaded; falls signs and symptoms of infection like fever; chills; cough; sore throat; pain or trouble passing urine signs and symptoms of liver injury like dark yellow or brown urine; general ill feeling or flu-like symptoms; light-colored stools; loss of appetite; nausea; right upper belly pain; unusually weak or tired; yellowing of the eyes or skin signs and  symptoms of low red blood cells or anemia such as unusually weak or tired; feeling faint or lightheaded; falls signs and symptoms of muscle injury like dark urine; trouble passing urine or change in the amount of urine; unusually weak or tired; muscle pain; back pain Side effects that usually do not require medical attention (report to your doctor or health care professional if they continue or are bothersome): changes in taste diarrhea gas hair loss loss of appetite mouth sores This list may not describe all possible side effects. Call your doctor for medical advice about side effects. You may report side effects to FDA at 1-800-FDA-1088. Where should I keep my medication? This drug is given in a hospital or clinic and will not be stored at home. NOTE: This sheet is a summary. It may not cover all possible information. If you have questions about  this medicine, talk to your doctor, pharmacist, or health care provider.  2022 Elsevier/[employer] Standard (2021-05-08 00:00:00)  Leucovorin injection What is this medication? LEUCOVORIN (loo koe VOR in) is used to prevent or treat the harmful effects of some medicines. This medicine is used to treat anemia caused by a low amount of folic acid in the body. It is also used with 5-fluorouracil (5-FU) to treat colon cancer. This medicine may be used for other purposes; ask your health care provider or pharmacist if you have questions. What should I tell my care team before I take this medication? They need to know if you have any of these conditions: anemia from low levels of vitamin B-12 in the blood an unusual or allergic reaction to leucovorin, folic acid, other medicines, foods, dyes, or preservatives pregnant or trying to get pregnant breast-feeding How should I use this medication? This medicine is for injection into a muscle or into a vein. It is given by a health care professional in a hospital or clinic setting. Talk to your pediatrician regarding the use of this medicine in children. Special care may be needed. Overdosage: If you think you have taken too much of this medicine contact a poison control center or emergency room at once. NOTE: This medicine is only for you. Do not share this medicine with others. What if I miss a dose? This does not apply. What may interact with this medication? capecitabine fluorouracil phenobarbital phenytoin primidone trimethoprim-sulfamethoxazole This list may not describe all possible interactions. Give your health care provider a list of all the medicines, herbs, non-prescription drugs, or dietary supplements you use. Also tell them if you smoke, drink alcohol, or use illegal drugs. Some items may interact with your medicine. What should I watch for while using this medication? Your condition will be monitored carefully while you are receiving this  medicine. This medicine may increase the side effects of 5-fluorouracil, 5-FU. Tell your doctor or health care professional if you have diarrhea or mouth sores that do not get better or that get worse. What side effects may I notice from receiving this medication? Side effects that you should report to your doctor or health care professional as soon as possible: allergic reactions like skin rash, itching or hives, swelling of the face, lips, or tongue breathing problems fever, infection mouth sores unusual bleeding or bruising unusually weak or tired Side effects that usually do not require medical attention (report to your doctor or health care professional if they continue or are bothersome): constipation or diarrhea loss of appetite nausea, vomiting This list may not describe all possible side effects. Call your doctor  for medical advice about side effects. You may report side effects to FDA at 1-800-FDA-1088. Where should I keep my medication? This drug is given in a hospital or clinic and will not be stored at home. NOTE: This sheet is a summary. It may not cover all possible information. If you have questions about this medicine, talk to your doctor, pharmacist, or health care provider.  2022 Elsevier/Gold Standard (2008-02-25 00:00:00)  Irinotecan injection What is this medication? IRINOTECAN (ir in oh TEE kan ) is a chemotherapy drug. It is used to treat colon and rectal cancer. This medicine may be used for other purposes; ask your health care provider or pharmacist if you have questions. COMMON BRAND NAME(S): Camptosar What should I tell my care team before I take this medication? They need to know if you have any of these conditions: dehydration diarrhea infection (especially a virus infection such as chickenpox, cold sores, or herpes) liver disease low blood counts, like low white cell, platelet, or red cell counts low levels of calcium, magnesium, or potassium in the  blood recent or ongoing radiation therapy an unusual or allergic reaction to irinotecan, other medicines, foods, dyes, or preservatives pregnant or trying to get pregnant breast-feeding How should I use this medication? This drug is given as an infusion into a vein. It is administered in a hospital or clinic by a specially trained health care professional. Talk to your pediatrician regarding the use of this medicine in children. Special care may be needed. Overdosage: If you think you have taken too much of this medicine contact a poison control center or emergency room at once. NOTE: This medicine is only for you. Do not share this medicine with others. What if I miss a dose? It is important not to miss your dose. Call your doctor or health care professional if you are unable to keep an appointment. What may interact with this medication? Do not take this medicine with any of the following medications: cobicistat itraconazole This medicine may interact with the following medications: antiviral medicines for HIV or AIDS certain antibiotics like rifampin or rifabutin certain medicines for fungal infections like ketoconazole, posaconazole, and voriconazole certain medicines for seizures like carbamazepine, phenobarbital, phenotoin clarithromycin gemfibrozil nefazodone [facility]. Maksim's Wort This list may not describe all possible interactions. Give your health care provider a list of all the medicines, herbs, non-prescription drugs, or dietary supplements you use. Also tell them if you smoke, drink alcohol, or use illegal drugs. Some items may interact with your medicine. What should I watch for while using this medication? Your condition will be monitored carefully while you are receiving this medicine. You will need important blood work done while you are taking this medicine. This drug may make you feel generally unwell. This is not uncommon, as chemotherapy can affect healthy cells as well as  cancer cells. Report any side effects. Continue your course of treatment even though you feel ill unless your doctor tells you to stop. In some cases, you may be given additional medicines to help with side effects. Follow all directions for their use. You may get drowsy or dizzy. Do not drive, use machinery, or do anything that needs mental alertness until you know how this medicine affects you. Do not stand or sit up quickly, especially if you are an older patient. This reduces the risk of dizzy or fainting spells. Call your health care professional for advice if you get a fever, chills, or sore throat, or other symptoms of a  cold or flu. Do not treat yourself. This medicine decreases your body's ability to fight infections. Try to avoid being around people who are sick. Avoid taking products that contain aspirin, acetaminophen, ibuprofen, naproxen, or ketoprofen unless instructed by your doctor. These medicines may hide a fever. This medicine may increase your risk to bruise or bleed. Call your doctor or health care professional if you notice any unusual bleeding. Be careful brushing and flossing your teeth or using a toothpick because you may get an infection or bleed more easily. If you have any dental work done, tell your dentist you are receiving this medicine. Do not become pregnant while taking this medicine or for 6 months after stopping it. Women should inform their health care professional if they wish to become pregnant or think they might be pregnant. Men should not father a child while taking this medicine and for 3 months after stopping it. There is potential for serious side effects to an unborn child. Talk to your health care professional for more information. Do not breast-feed an infant while taking this medicine or for 7 days after stopping it. This medicine has caused ovarian failure in some women. This medicine may make it more difficult to get pregnant. Talk to your health care  professional if you are concerned about your fertility. This medicine has caused decreased sperm counts in some men. This may make it more difficult to father a child. Talk to your health care professional if you are concerned about your fertility. What side effects may I notice from receiving this medication? Side effects that you should report to your doctor or health care professional as soon as possible: allergic reactions like skin rash, itching or hives, swelling of the face, lips, or tongue chest pain diarrhea flushing, runny nose, sweating during infusion low blood counts - this medicine may decrease the number of white blood cells, red blood cells and platelets. You may be at increased risk for infections and bleeding. nausea, vomiting pain, swelling, warmth in the leg signs of decreased platelets or bleeding - bruising, pinpoint red spots on the skin, black, tarry stools, blood in the urine signs of infection - fever or chills, cough, sore throat, pain or difficulty passing urine signs of decreased red blood cells - unusually weak or tired, fainting spells, lightheadedness Side effects that usually do not require medical attention (report to your doctor or health care professional if they continue or are bothersome): constipation hair loss headache loss of appetite mouth sores stomach pain This list may not describe all possible side effects. Call your doctor for medical advice about side effects. You may report side effects to FDA at 1-800-FDA-1088. Where should I keep my medication? This drug is given in a hospital or clinic and will not be stored at home. NOTE: This sheet is a summary. It may not cover all possible information. If you have questions about this medicine, talk to your doctor, pharmacist, or health care provider.  2022 [employer]/Gold Standard (2021-05-08 00:00:00)  Fluorouracil, 5-FU injection What is this medication? FLUOROURACIL, 5-FU (flure oh YOOR a sil) is  a chemotherapy drug. It slows the growth of cancer cells. This medicine is used to treat many types of cancer like breast cancer, colon or rectal cancer, pancreatic cancer, and stomach cancer. This medicine may be used for other purposes; ask your health care provider or pharmacist if you have questions. COMMON BRAND NAME(S): Adrucil What should I tell my care team before I take this medication? They  need to know if you have any of these conditions: blood disorders dihydropyrimidine dehydrogenase (DPD) deficiency infection (especially a virus infection such as chickenpox, cold sores, or herpes) kidney disease liver disease malnourished, poor nutrition recent or ongoing radiation therapy an unusual or allergic reaction to fluorouracil, other chemotherapy, other medicines, foods, dyes, or preservatives pregnant or trying to get pregnant breast-feeding How should I use this medication? This drug is given as an infusion or injection into a vein. It is administered in a hospital or clinic by a specially trained health care professional. Talk to your pediatrician regarding the use of this medicine in children. Special care may be needed. Overdosage: If you think you have taken too much of this medicine contact a poison control center or emergency room at once. NOTE: This medicine is only for you. Do not share this medicine with others. What if I miss a dose? It is important not to miss your dose. Call your doctor or health care professional if you are unable to keep an appointment. What may interact with this medication? Do not take this medicine with any of the following medications: live virus vaccines This medicine may also interact with the following medications: medicines that treat or prevent blood clots like warfarin, enoxaparin, and dalteparin This list may not describe all possible interactions. Give your health care provider a list of all the medicines, herbs, non-prescription drugs,  or dietary supplements you use. Also tell them if you smoke, drink alcohol, or use illegal drugs. Some items may interact with your medicine. What should I watch for while using this medication? Visit your doctor for checks on your progress. This drug may make you feel generally unwell. This is not uncommon, as chemotherapy can affect healthy cells as well as cancer cells. Report any side effects. Continue your course of treatment even though you feel ill unless your doctor tells you to stop. In some cases, you may be given additional medicines to help with side effects. Follow all directions for their use. Call your doctor or health care professional for advice if you get a fever, chills or sore throat, or other symptoms of a cold or flu. Do not treat yourself. This drug decreases your body's ability to fight infections. Try to avoid being around people who are sick. This medicine may increase your risk to bruise or bleed. Call your doctor or health care professional if you notice any unusual bleeding. Be careful brushing and flossing your teeth or using a toothpick because you may get an infection or bleed more easily. If you have any dental work done, tell your dentist you are receiving this medicine. Avoid taking products that contain aspirin, acetaminophen, ibuprofen, naproxen, or ketoprofen unless instructed by your doctor. These medicines may hide a fever. Do not become pregnant while taking this medicine. Women should inform their doctor if they wish to become pregnant or think they might be pregnant. There is a potential for serious side effects to an unborn child. Talk to your health care professional or pharmacist for more information. Do not breast-feed an infant while taking this medicine. Men should inform their doctor if they wish to father a child. This medicine may lower sperm counts. Do not treat diarrhea with over the counter products. Contact your doctor if you have diarrhea that lasts  more than 2 days or if it is severe and watery. This medicine can make you more sensitive to the sun. Keep out of the sun. If you cannot avoid being  in the sun, wear protective clothing and use sunscreen. Do not use sun lamps or tanning beds/booths. What side effects may I notice from receiving this medication? Side effects that you should report to your doctor or health care professional as soon as possible: allergic reactions like skin rash, itching or hives, swelling of the face, lips, or tongue low blood counts - this medicine may decrease the number of white blood cells, red blood cells and platelets. You may be at increased risk for infections and bleeding. signs of infection - fever or chills, cough, sore throat, pain or difficulty passing urine signs of decreased platelets or bleeding - bruising, pinpoint red spots on the skin, black, tarry stools, blood in the urine signs of decreased red blood cells - unusually weak or tired, fainting spells, lightheadedness breathing problems changes in vision chest pain mouth sores nausea and vomiting pain, swelling, redness at site where injected pain, tingling, numbness in the hands or feet redness, swelling, or sores on hands or feet stomach pain unusual bleeding Side effects that usually do not require medical attention (report to your doctor or health care professional if they continue or are bothersome): changes in finger or toe nails diarrhea dry or itchy skin hair loss headache loss of appetite sensitivity of eyes to the light stomach upset unusually teary eyes This list may not describe all possible side effects. Call your doctor for medical advice about side effects. You may report side effects to FDA at 1-800-FDA-1088. Where should I keep my medication? This drug is given in a hospital or clinic and will not be stored at home. NOTE: This sheet is a summary. It may not cover all possible information. If you have questions about  this medicine, talk to your doctor, pharmacist, or health care provider.  2022 [employer]/Gold Standard (2021-05-08 00:00:00)

## 2021-07-23 NOTE — Progress Notes (Signed)
Patient presents for treatment. RN assessment completed along with the following:  Labs/vitals reviewed - Yes, and within treatment parameters.   Weight within 10% of previous measurement - Yes Oncology Treatment Attestation completed for current therapy- Yes, on date 05/25/21 Informed consent completed and reflects current therapy/intent - Consent is active but does not reflect current intent of treatment. New consent to be obtained prior to today's treatment.            Provider progress note reviewed - Patient not seen by provider today. Most recent note dated 07/16/21 reviewed. Treatment/Antibody/Supportive plan reviewed - Yes, and there are no adjustments needed for today's treatment. S&H and other orders reviewed - Yes, and there are no additional orders identified. Previous treatment date reviewed - Yes, and the appropriate amount of time has elapsed between treatments. Clinic Hand Off Received from - none   Patient to proceed with treatment. .pre

## 2021-07-23 NOTE — Progress Notes (Signed)
  [facility] OFFICE PROGRESS NOTE   Diagnosis: Pancreas cancer  INTERVAL HISTORY:   David Jarvis returns as scheduled.  He completed cycle 2 FOLFIRINOX 07/02/2021.  Cycle 3 was held last week due to neutropenia and thrombocytopenia.  He reports having a good week off of treatment.  He denies nausea/vomiting.  No mouth sores.  No diarrhea.  No numbness or tingling in the hands or feet.  Good appetite.  Objective:  Vital signs in last 24 hours:  Temperature 97.5, heart rate 59, respirations 18, blood pressure 141/80, weight 157.8 pound  HEENT: No thrush or ulcers. Resp: Lungs clear bilaterally. Cardio: Regular rate and rhythm. GI: Abdomen soft and nontender. Vascular: No leg edema. Skin: Palms without erythema. Port-A-Cath without erythema.   Lab Results:  Lab Results  Component Value Date   WBC 5.1 07/23/2021   HGB 10.7 (L) 07/23/2021   HCT 32.5 (L) 07/23/2021   MCV 88.3 07/23/2021   PLT 152 07/23/2021   NEUTROABS 2.6 07/23/2021    Imaging:  No results found.  Medications: I have reviewed the patient's current medications.  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, stent placed EUS 05/23/2021-25 x 15 mm pancreas head mass with irregular borders, no lymphadenopathy, no vascular involvement, T2 N0 by ultrasound CT chest 06/01/2021-negative for metastatic disease CT abdomen pancreas protocol 06/01/2021-3.2 x 2.3 cm hypoenhancing pancreas head mass inseparable from the third portion of the duodenum, 1 cm left retroperitoneal lymph node, no vascular involvement Cycle 1 FOLFIRINOX 06/19/2021 Cycle 2 FOLFIRINOX 07/02/2021, irinotecan and 5-FU dose reduced, prophylactic dexamethasone added beginning day of pump discontinuation Cycle 3  FOLFIRINOX 07/23/2021, Udenyca Obstructive jaundice secondary #1 Hypertension Admission in February 2022 with elevated liver enzymes MRI/MRCP-intra and extrahepatic biliary duct dilation, tapering smoothly to the ampulla, no mass or filling defect ERCP 10/06/2020-dilated main bile duct, biliary tree was swept and nothing was found, cytology from bile duct brushing-no malignant cells identified 5.  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  GERD 7.  Squamous cell carcinoma left forearm 2014 8.  Hyperlipidemia    Disposition: [name] appears stable.  He has completed 2 cycles of FOLFIRINOX.  Cycle 3 was held last week due to neutropenia and thrombocytopenia.  We reviewed the CBC from today.  Counts adequate to proceed with treatment.  He will receive Udenyca on the day of pump discontinuation.  He agrees with this plan.  He will return for lab, follow-up, cycle 4 FOLFIRINOX in 2 weeks.  We are available to see him sooner if needed.    Ned Card ANP/GNP-BC   07/23/2021  10:55 AM

## 2021-07-25 ENCOUNTER — Other Ambulatory Visit: Payer: Self-pay

## 2021-07-25 ENCOUNTER — Inpatient Hospital Stay: Payer: Medicare HMO

## 2021-07-25 VITALS — BP 112/74 | HR 65 | Temp 97.8°F | Resp 18

## 2021-07-25 DIAGNOSIS — Z5111 Encounter for antineoplastic chemotherapy: Secondary | ICD-10-CM | POA: Diagnosis not present

## 2021-07-25 DIAGNOSIS — I959 Hypotension, unspecified: Secondary | ICD-10-CM | POA: Diagnosis not present

## 2021-07-25 DIAGNOSIS — C250 Malignant neoplasm of head of pancreas: Secondary | ICD-10-CM | POA: Diagnosis not present

## 2021-07-25 DIAGNOSIS — Z5189 Encounter for other specified aftercare: Secondary | ICD-10-CM | POA: Diagnosis not present

## 2021-07-25 DIAGNOSIS — D6959 Other secondary thrombocytopenia: Secondary | ICD-10-CM | POA: Diagnosis not present

## 2021-07-25 DIAGNOSIS — R748 Abnormal levels of other serum enzymes: Secondary | ICD-10-CM | POA: Diagnosis not present

## 2021-07-25 DIAGNOSIS — D701 Agranulocytosis secondary to cancer chemotherapy: Secondary | ICD-10-CM | POA: Diagnosis not present

## 2021-07-25 DIAGNOSIS — E785 Hyperlipidemia, unspecified: Secondary | ICD-10-CM | POA: Diagnosis not present

## 2021-07-25 DIAGNOSIS — C772 Secondary and unspecified malignant neoplasm of intra-abdominal lymph nodes: Secondary | ICD-10-CM | POA: Diagnosis not present

## 2021-07-25 DIAGNOSIS — T451X5A Adverse effect of antineoplastic and immunosuppressive drugs, initial encounter: Secondary | ICD-10-CM | POA: Diagnosis not present

## 2021-07-25 MED ORDER — PEGFILGRASTIM-CBQV 6 MG/0.6ML ~~LOC~~ SOSY
6.0000 mg | PREFILLED_SYRINGE | Freq: Once | SUBCUTANEOUS | Status: AC
  Administered 2021-07-25: 6 mg via SUBCUTANEOUS

## 2021-07-25 MED ORDER — HEPARIN SOD (PORK) LOCK FLUSH 100 UNIT/ML IV SOLN
500.0000 [IU] | Freq: Once | INTRAVENOUS | Status: AC | PRN
  Administered 2021-07-25: 500 [IU]

## 2021-07-25 MED ORDER — SODIUM CHLORIDE 0.9% FLUSH
10.0000 mL | INTRAVENOUS | Status: DC | PRN
  Administered 2021-07-25: 10 mL

## 2021-07-25 NOTE — Patient Instructions (Signed)
[facility]  Discharge Instructions: Thank you for choosing [facility] to provide your oncology and hematology care.   If you have a lab appointment with the [facility], please go directly to the [facility] and check in at the registration area.   Wear comfortable clothing and clothing appropriate for easy access to any Portacath or PICC line.   We strive to give you quality time with your provider. You may need to reschedule your appointment if you arrive late (15 or more minutes).  Arriving late affects you and other patients whose appointments are after yours.  Also, if you miss three or more appointments without notifying the office, you may be dismissed from the clinic at the provider's discretion.      For prescription refill requests, have your pharmacy contact our office and allow 72 hours for refills to be completed.    Today you received the following chemotherapy and/or immunotherapy agents 5FU.      To help prevent nausea and vomiting after your treatment, we encourage you to take your nausea medication as directed.  BELOW ARE SYMPTOMS THAT SHOULD BE REPORTED IMMEDIATELY: *FEVER GREATER THAN 100.4 F (38 C) OR HIGHER *CHILLS OR SWEATING *NAUSEA AND VOMITING THAT IS NOT CONTROLLED WITH YOUR NAUSEA MEDICATION *UNUSUAL SHORTNESS OF BREATH *UNUSUAL BRUISING OR BLEEDING *URINARY PROBLEMS (pain or burning when urinating, or frequent urination) *BOWEL PROBLEMS (unusual diarrhea, constipation, pain near the anus) TENDERNESS IN MOUTH AND THROAT WITH OR WITHOUT PRESENCE OF ULCERS (sore throat, sores in mouth, or a toothache) UNUSUAL RASH, SWELLING OR PAIN  UNUSUAL VAGINAL DISCHARGE OR ITCHING   Items with * indicate a potential emergency and should be followed up as soon as possible or go to the Emergency Department if any problems should occur.  Please show the CHEMOTHERAPY ALERT CARD or IMMUNOTHERAPY ALERT CARD at check-in to the  Emergency Department and triage nurse.  Should you have questions after your visit or need to cancel or reschedule your appointment, please contact [facility]  Dept: [phone]  and follow the prompts.  Office hours are 8:00 a.m. to 4:30 p.m. Monday - Friday. Please note that voicemails left after 4:00 p.m. may not be returned until the following business day.  We are closed weekends and major holidays. You have access to a nurse at all times for urgent questions. Please call the main number to the clinic Dept: [phone] and follow the prompts.   For any non-urgent questions, you may also contact your provider using MyChart. We now offer e-Visits for anyone 41 and older to request care online for non-urgent symptoms. For details visit [email or web address].   Also download the MyChart app! Go to the app store, search "MyChart", open the app, select [facility], and log in with your MyChart username and password.  Due to Covid, a mask is required upon entering the hospital/clinic. If you do not have a mask, one will be given to you upon arrival. For doctor visits, patients may have 1 support person aged 45 or older with them. For treatment visits, patients cannot have anyone with them due to current Covid guidelines and our immunocompromised population.

## 2021-07-27 ENCOUNTER — Other Ambulatory Visit (HOSPITAL_BASED_OUTPATIENT_CLINIC_OR_DEPARTMENT_OTHER): Payer: Self-pay

## 2021-07-27 MED ORDER — TAMSULOSIN HCL 0.4 MG PO CAPS
ORAL_CAPSULE | ORAL | 4 refills | Status: AC
  Filled 2021-09-03: qty 90, 90d supply, fill #0
  Filled 2021-12-10: qty 90, 90d supply, fill #1

## 2021-07-27 MED ORDER — ATORVASTATIN CALCIUM 10 MG PO TABS
ORAL_TABLET | ORAL | 1 refills | Status: DC
  Filled 2022-01-16: qty 90, 90d supply, fill #0

## 2021-07-27 MED ORDER — VALSARTAN-HYDROCHLOROTHIAZIDE 80-12.5 MG PO TABS: ORAL_TABLET | ORAL | 6 refills | Status: DC

## 2021-07-27 MED ORDER — LEVOTHYROXINE SODIUM 25 MCG PO TABS
ORAL_TABLET | ORAL | 3 refills | Status: AC
Start: 2021-06-06 — End: ?
  Filled 2021-09-04: qty 90, 90d supply, fill #0
  Filled 2021-12-10: qty 80, 80d supply, fill #1
  Filled 2021-12-10: qty 10, 10d supply, fill #1

## 2021-07-30 ENCOUNTER — Inpatient Hospital Stay: Payer: Medicare HMO

## 2021-07-30 ENCOUNTER — Inpatient Hospital Stay: Payer: Medicare HMO | Admitting: Oncology

## 2021-07-31 ENCOUNTER — Other Ambulatory Visit (HOSPITAL_BASED_OUTPATIENT_CLINIC_OR_DEPARTMENT_OTHER): Payer: Self-pay

## 2021-07-31 MED ORDER — PANTOPRAZOLE SODIUM 40 MG PO TBEC
DELAYED_RELEASE_TABLET | ORAL | 3 refills | Status: AC
  Filled 2021-07-31 – 2021-12-10 (×2): qty 90, 90d supply, fill #0

## 2021-08-01 ENCOUNTER — Other Ambulatory Visit: Payer: Self-pay

## 2021-08-01 ENCOUNTER — Telehealth: Payer: Self-pay | Admitting: Surgery

## 2021-08-01 ENCOUNTER — Emergency Department (HOSPITAL_BASED_OUTPATIENT_CLINIC_OR_DEPARTMENT_OTHER): Payer: Medicare HMO

## 2021-08-01 ENCOUNTER — Emergency Department (HOSPITAL_BASED_OUTPATIENT_CLINIC_OR_DEPARTMENT_OTHER)
Admission: EM | Admit: 2021-08-01 | Discharge: 2021-08-01 | Disposition: A | Discharge status: Home / Self Care | Payer: Medicare HMO | Attending: Emergency Medicine | Admitting: Emergency Medicine

## 2021-08-01 ENCOUNTER — Inpatient Hospital Stay: Payer: Medicare HMO

## 2021-08-01 ENCOUNTER — Encounter: Payer: Self-pay | Admitting: *Deleted

## 2021-08-01 ENCOUNTER — Encounter (HOSPITAL_BASED_OUTPATIENT_CLINIC_OR_DEPARTMENT_OTHER): Payer: Self-pay

## 2021-08-01 DIAGNOSIS — Z7984 Long term (current) use of oral hypoglycemic drugs: Secondary | ICD-10-CM | POA: Diagnosis not present

## 2021-08-01 DIAGNOSIS — I10 Essential (primary) hypertension: Secondary | ICD-10-CM | POA: Insufficient documentation

## 2021-08-01 DIAGNOSIS — C259 Malignant neoplasm of pancreas, unspecified: Secondary | ICD-10-CM | POA: Diagnosis not present

## 2021-08-01 DIAGNOSIS — R1013 Epigastric pain: Secondary | ICD-10-CM | POA: Diagnosis not present

## 2021-08-01 DIAGNOSIS — Z79899 Other long term (current) drug therapy: Secondary | ICD-10-CM | POA: Diagnosis not present

## 2021-08-01 DIAGNOSIS — R072 Precordial pain: Secondary | ICD-10-CM | POA: Insufficient documentation

## 2021-08-01 DIAGNOSIS — Z7982 Long term (current) use of aspirin: Secondary | ICD-10-CM | POA: Diagnosis not present

## 2021-08-01 DIAGNOSIS — R7989 Other specified abnormal findings of blood chemistry: Secondary | ICD-10-CM | POA: Diagnosis not present

## 2021-08-01 DIAGNOSIS — Z85828 Personal history of other malignant neoplasm of skin: Secondary | ICD-10-CM | POA: Insufficient documentation

## 2021-08-01 DIAGNOSIS — K219 Gastro-esophageal reflux disease without esophagitis: Secondary | ICD-10-CM | POA: Insufficient documentation

## 2021-08-01 DIAGNOSIS — J9811 Atelectasis: Secondary | ICD-10-CM | POA: Diagnosis not present

## 2021-08-01 LAB — COMPREHENSIVE METABOLIC PANEL
ALT: 19 U/L (ref 0–44)
AST: 15 U/L (ref 15–41)
Albumin: 4 g/dL (ref 3.5–5.0)
Alkaline Phosphatase: 98 U/L (ref 38–126)
Anion gap: 9 (ref 5–15)
BUN: 16 mg/dL (ref 8–23)
CO2: 27 mmol/L (ref 22–32)
Calcium: 9 mg/dL (ref 8.9–10.3)
Chloride: 103 mmol/L (ref 98–111)
Creatinine, Ser: 1.02 mg/dL (ref 0.61–1.24)
GFR, Estimated: >60 mL/min (ref >60)
Glucose, Bld: 110 mg/dL — ABNORMAL HIGH (ref 70–99)
Potassium: 3.6 mmol/L (ref 3.5–5.1)
Sodium: 139 mmol/L (ref 135–145)
Total Bilirubin: 0.4 mg/dL (ref 0.3–1.2)
Total Protein: 6.1 g/dL — ABNORMAL LOW (ref 6.5–8.1)

## 2021-08-01 LAB — CBC
HCT: 32.7 % — ABNORMAL LOW (ref 39.0–52.0)
Hemoglobin: 10.7 g/dL — ABNORMAL LOW (ref 13.0–17.0)
MCH: 29.2 pg (ref 26.0–34.0)
MCHC: 32.7 g/dL (ref 30.0–36.0)
MCV: 89.1 fL (ref 80.0–100.0)
Platelets: 126 10*3/uL — ABNORMAL LOW (ref 150–400)
RBC: 3.67 MIL/uL — ABNORMAL LOW (ref 4.22–5.81)
RDW: 16.3 % — ABNORMAL HIGH (ref 11.5–15.5)
WBC: 10.3 10*3/uL (ref 4.0–10.5)
nRBC: 0.6 % — ABNORMAL HIGH (ref 0.0–0.2)

## 2021-08-01 LAB — D-DIMER, QUANTITATIVE: D-Dimer, Quant: 1.5 ug/mL-FEU — ABNORMAL HIGH (ref 0.00–0.50)

## 2021-08-01 LAB — LIPASE, BLOOD: Lipase: <10 U/L — ABNORMAL LOW (ref 11–51)

## 2021-08-01 MED ORDER — IOHEXOL 350 MG/ML SOLN
75.0000 mL | Freq: Once | INTRAVENOUS | Status: AC | PRN
  Administered 2021-08-01: 75 mL via INTRAVENOUS

## 2021-08-01 MED ORDER — SODIUM CHLORIDE 0.9 % IV BOLUS
1000.0000 mL | Freq: Once | INTRAVENOUS | Status: AC
  Administered 2021-08-01: 1000 mL via INTRAVENOUS

## 2021-08-01 MED ORDER — SUCRALFATE 1 G PO TABS: 1.0000 g | ORAL_TABLET | Freq: Three times a day (TID) | ORAL | 0 refills | Status: DC

## 2021-08-01 MED ORDER — PANTOPRAZOLE SODIUM 40 MG IV SOLR
40.0000 mg | Freq: Once | INTRAVENOUS | Status: AC
  Administered 2021-08-01: 40 mg via INTRAVENOUS
  Filled 2021-08-01: qty 40

## 2021-08-01 NOTE — Telephone Encounter (Signed)
Pt left a voicemail stating that he talked to Peggye Form, and Dr. Benay Spice, and that he was advised to go to the ER.  He called to ask which ER he should go to.  I called him back to let him know to go to the [facility], in case he ends up being admitted.  Pt verbalized understanding.

## 2021-08-01 NOTE — Discharge Instructions (Addendum)

## 2021-08-01 NOTE — ED Triage Notes (Signed)
Patient here POV from Home with ABD Pain.  Pain is located in Epigastric Region and occurs with Deep Inspiration and began Monday. No SOB. No Nausea. No Vomiting. No Dizziness.  History of Pancreatic Cancer.  NAD Noted during Triage. A&Ox4. GCS 15. Ambulatory.

## 2021-08-01 NOTE — ED Provider Notes (Signed)
[facility] EMERGENCY DEPT Provider Note   CSN: [id number] Arrival date & time: 08/01/21  1521     History Chief Complaint  Patient presents with   Abdominal Pain    CHRISHAUN SASSO is a 70 y.o. male.  This is a 70 y.o. male with significant medical history as below, including pancreatic ca with active treatment who presents to the ED with complaint of pleuritic pain x3-4 days. Pt felt as though symptoms began 1-2 days after last cancer treatment. Epigastric discomfort provoked by deep inspiration, no pain otherwise, no dyspnea. No fevers, chills, n/v/d, no change to bladder fxn, no recent med changes, no recent travel or sick contacts, no hx dvt or PE.  No acute pain in his chest.  Pain is localized to his epigastrium, nonradiating.  Described as sharp, stabbing only with deep inspiration.  He has no pain at rest.  Feels that the symptoms have been gradually improving since the onset.  Sent by oncologist Dr Learta Codding for PE eval.   The history is provided by the patient. No language interpreter was used.  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   right temple (MOHS)   BPV (benign positional vertigo)    Colon polyps    GERD (gastroesophageal reflux disease)    Glucose intolerance (impaired glucose tolerance)    History of nuclear stress test    ETT-Myoview 2/18: EF 57, no ischemia, low risk   Hyperlipidemia    Hypertension    Melanoma ([place]) 07/22/2008   RIGHT TEMPLE END STAGE LICHENORO REGRESSION MELANODERMA TX MOHS   Right  knee pain    SCC (squamous cell carcinoma) 02/24/2013   left inner forearm ([id number])   Vitreous floaters of both eyes     Patient Active Problem List   Diagnosis Date Noted   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   Genetic testing 07/02/2021   Primary cancer of head of pancreas ([place]) 05/25/2021   Epigastric abdominal pain 10/06/2020   Transaminitis 10/06/2020   Elevated lipase 10/06/2020   Essential hypertension 10/06/2020   Common bile duct dilatation 10/06/2020   Hyperlipidemia 10/06/2020   BPH (benign prostatic hyperplasia) 10/06/2020   Abdominal pain 10/05/2020   Fatigue 10/22/2016   Chest discomfort 10/22/2016    Past Surgical History:  Procedure Laterality Date   ARTHROSCOPIC REPAIR ACL Right    BILIARY BRUSHING  10/06/2020   Procedure: BILIARY BRUSHING;  Surgeon: Ronnette Juniper, MD;  Location: [facility];  Service: Gastroenterology;;   BILIARY STENT PLACEMENT N/A 05/23/2021   Procedure: BILIARY STENT PLACEMENT;  Surgeon: Ronnette Juniper, MD;  Location: [facility];  Service: Gastroenterology;  Laterality: N/A;   cardiac stress test  08/2008   COLONOSCOPY W/ POLYPECTOMY  2006   ENDOSCOPIC RETROGRADE CHOLANGIOPANCREATOGRAPHY (ERCP) WITH PROPOFOL N/A 05/23/2021   Procedure: ENDOSCOPIC RETROGRADE CHOLANGIOPANCREATOGRAPHY (ERCP) WITH PROPOFOL;  Surgeon: Ronnette Juniper, MD;  Location: [facility];  Service: Gastroenterology;  Laterality: N/A;   ERCP N/A 10/06/2020   Procedure: ENDOSCOPIC RETROGRADE CHOLANGIOPANCREATOGRAPHY (ERCP);  Surgeon: Ronnette Juniper, MD;  Location: [facility];  Service: Gastroenterology;  Laterality:  N/A;   EUS N/A 05/23/2021   Procedure: UPPER ENDOSCOPIC ULTRASOUND (EUS) LINEAR;  Surgeon: Willis Modena, MD;  Location: [facility];  Service: Endoscopy;  Laterality: N/A;   FINE NEEDLE ASPIRATION N/A 05/23/2021   Procedure: FINE NEEDLE ASPIRATION (FNA) LINEAR;  Surgeon: Willis Modena, MD;  Location: [facility];  Service: Endoscopy;  Laterality: N/A;   I & D EXTREMITY Left  02/03/2020   Procedure: IRRIGATION AND DEBRIDEMENT EXTREMITY;  Surgeon: Knute Neu, MD;  Location: [facility];  Service: Plastics;  Laterality: Left;   IR IMAGING GUIDED PORT INSERTION  06/11/2021   laser surgery of the eye     SPHINCTEROTOMY  10/06/2020   Procedure: SPHINCTEROTOMY;  Surgeon: Kerin Salen, MD;  Location: [facility];  Service: Gastroenterology;;  balloon sweep       Family History  Problem Relation Age of Onset   CAD Mother    Diabetes Mother    Heart attack Mother 66   CVA Father    CVA Sister    CAD Brother    Diabetes Brother    Heart attack Brother 34   Cancer Paternal Aunt        unknown type dx. 40s   Breast cancer Cousin        paternal cousin, dx. 93s   Heart failure Neg Hx     Social History   Tobacco Use   Smoking status: Never   Smokeless tobacco: Never  Vaping Use   Vaping Use: Never used  Substance Use Topics   Alcohol use: Yes    Comment: occasionally   Drug use: No    Home Medications Prior to Admission medications   Medication Sig Start Date End Date Taking? Authorizing Provider  sucralfate (CARAFATE) 1 g tablet Take 1 tablet (1 g total) by mouth 4 (four) times daily -  with meals and at bedtime for 7 days. 08/01/21 08/08/21 Yes Tanda Rockers A, DO  amLODipine (NORVASC) 5 MG tablet Take 1 tablet (5 mg total) by mouth daily. Patient not taking: No sig reported 10/06/20   Rodolph Bong, MD  aspirin EC 81 MG tablet Take 81 mg by mouth daily.    [provider]  atorvastatin (LIPITOR) 10 MG tablet Take 10 mg by mouth daily. Patient not taking: No sig reported    [provider]  atorvastatin (LIPITOR) 10 MG tablet Take 1 tablet by mouth daily 05/30/21     dexamethasone (DECADRON) 4 MG tablet Take 1 tab twice daily for 3 days beginning day pump is removed 07/02/21   Rana Snare, NP  diphenoxylate-atropine (LOMOTIL) 2.5-0.025 MG tablet Take 1 tablet by mouth 4 (four) times daily as needed for diarrhea or loose stools.  06/27/21   Rana Snare, NP  tamsulosin (FLOMAX) 0.4 MG CAPS capsule Take 1 capsule by mouth once daily 03/08/21     levothyroxine (SYNTHROID) 25 MCG tablet Take 25 mcg by mouth every morning. 03/30/21   [provider]  levothyroxine (SYNTHROID) 25 MCG tablet Take 1 tablet by mouth daily before breakfast 06/06/21     lidocaine-prilocaine (EMLA) cream Apply 1 application topically as needed. Patient not taking: No sig reported 06/06/21   Ladene Artist, MD  LORazepam (ATIVAN) 0.5 MG tablet Place 1 tablet (0.5 mg total) under the tongue every 8 (eight) hours as needed (for nausea). 06/26/21   Rana Snare, NP  magnesium oxide (MAG-OX) 400 (240 Mg) MG tablet Take 1 tablet (400 mg total) by mouth daily. 07/02/21  Owens Shark, NP  ondansetron (ZOFRAN) 8 MG tablet Take 1 tablet (8 mg total) by mouth 2 (two) times daily. Patient not taking: No sig reported 06/06/21   Ladell Pier, MD  pantoprazole (PROTONIX) 40 MG tablet Take 40 mg by mouth daily.    [provider]  pantoprazole (PROTONIX) 40 MG tablet TAKE ONE TABLET BY MOUTH EVERY DAY 90 days 07/31/21     potassium chloride SA (KLOR-CON) 20 MEQ tablet Take 1 tab twice a day for 3 days, then 1 tab daily 07/02/21   Owens Shark, NP  prochlorperazine (COMPAZINE) 10 MG tablet Take 1 tablet (10 mg total) by mouth every 6 (six) hours as needed for nausea or vomiting. Patient not taking: No sig reported 06/06/21   Ladell Pier, MD  tamsulosin (FLOMAX) 0.4 MG CAPS capsule Take 0.4 mg by mouth daily. 11/30/19   [provider]  tamsulosin (FLOMAX) 0.4 MG CAPS capsule at bedtime 11/30/19   [provider]  valsartan-hydrochlorothiazide (DIOVAN-HCT) 80-12.5 MG tablet Take 0.5 tablets by mouth daily. 03/08/21   [provider]  valsartan-hydrochlorothiazide (DIOVAN-HCT) 80-12.5 MG tablet Take 1/2 tablet by mouth once daily *please half* 03/08/21       Allergies    Ace inhibitors and Lisinopril  Review of  Systems   Review of Systems  Constitutional:  Negative for chills, fatigue and fever.  HENT:  Negative for facial swelling and trouble swallowing.   Eyes:  Negative for photophobia and visual disturbance.  Respiratory:  Negative for cough and shortness of breath.   Cardiovascular:  Negative for chest pain and palpitations.  Gastrointestinal:  Positive for abdominal pain. Negative for nausea and vomiting.  Endocrine: Negative for polydipsia and polyuria.  Genitourinary:  Negative for difficulty urinating, dysuria and hematuria.  Musculoskeletal:  Negative for gait problem and joint swelling.  Skin:  Negative for pallor and rash.  Neurological:  Negative for syncope and headaches.  Psychiatric/Behavioral:  Negative for agitation and confusion.    Physical Exam Updated Vital Signs BP (!) 148/85   Pulse 63   Temp 98.4 F (36.9 C) (Oral)   Resp 16   Ht $R'5\' 6"'SZ$  (1.676 m)   Wt 71.2 kg   SpO2 97%   BMI 25.34 kg/m   Physical Exam Vitals and nursing note reviewed.  Constitutional:      General: He is not in acute distress.    Appearance: He is well-developed.  HENT:     Head: Normocephalic and atraumatic.     Right Ear: External ear normal.     Left Ear: External ear normal.     Mouth/Throat:     Mouth: Mucous membranes are moist.  Eyes:     General: No scleral icterus. Cardiovascular:     Rate and Rhythm: Normal rate and regular rhythm.     Pulses: Normal pulses.     Heart sounds: Normal heart sounds.  Pulmonary:     Effort: Pulmonary effort is normal. No respiratory distress.     Breath sounds: Normal breath sounds.  Abdominal:     General: Abdomen is flat.     Palpations: Abdomen is soft.     Tenderness: There is abdominal tenderness in the epigastric area.    Musculoskeletal:        General: Normal range of motion.     Cervical back: Normal range of motion.     Right lower leg: No edema.     Left lower leg: No edema.  Skin:  General: Skin is warm and dry.      Capillary Refill: Capillary refill takes less than 2 seconds.  Neurological:     Mental Status: He is alert and oriented to person, place, and time.  Psychiatric:        Mood and Affect: Mood normal.        Behavior: Behavior normal.    ED Results / Procedures / Treatments   Labs (all labs ordered are listed, but only abnormal results are displayed) Labs Reviewed  LIPASE, BLOOD - Abnormal; Notable for the following components:      Result Value   Lipase <10 (*)    All other components within normal limits  COMPREHENSIVE METABOLIC PANEL - Abnormal; Notable for the following components:   Glucose, Bld 110 (*)    Total Protein 6.1 (*)    All other components within normal limits  CBC - Abnormal; Notable for the following components:   RBC 3.67 (*)    Hemoglobin 10.7 (*)    HCT 32.7 (*)    RDW 16.3 (*)    Platelets 126 (*)    nRBC 0.6 (*)    All other components within normal limits  D-DIMER, QUANTITATIVE - Abnormal; Notable for the following components:   D-Dimer, Quant 1.50 (*)    All other components within normal limits  URINALYSIS, ROUTINE W REFLEX MICROSCOPIC    EKG EKG Interpretation  Date/Time:  Wednesday August 01 2021 15:59:35 EST Ventricular Rate:  67 PR Interval:  143 QRS Duration: 94 QT Interval:  404 QTC Calculation: 427 R Axis:   46 Text Interpretation: Sinus rhythm Inferior infarct, old Similar to prior tracing Confirmed by Wynona Dove ([id number]) on 08/01/2021 6:11:07 PM  Radiology CT Angio Chest PE W and/or Wo Contrast  Result Date: 08/01/2021 CLINICAL DATA:  Positive D-dimer. EXAM: CT ANGIOGRAPHY CHEST WITH CONTRAST TECHNIQUE: Multidetector CT imaging of the chest was performed using the standard protocol during bolus administration of intravenous contrast. Multiplanar CT image reconstructions and MIPs were obtained to evaluate the vascular anatomy. CONTRAST:  50mL OMNIPAQUE IOHEXOL 350 MG/ML SOLN COMPARISON:  Chest CT dated 06/01/2021. FINDINGS:  Cardiovascular: There is no cardiomegaly or pericardial effusion. There is advanced 3 vessel coronary vascular calcification. The thoracic aorta is unremarkable. No pulmonary artery embolus identified. Mediastinum/Nodes: No hilar or mediastinal adenopathy. The esophagus is grossly unremarkable. No mediastinal fluid collection. Lungs/Pleura: Minimal bibasilar dependent atelectasis. No focal consolidation, pleural effusion, or pneumothorax. The central airways are patent. Upper Abdomen: Biliary stent and pneumobilia. Ill-defined stranding surrounding the head of the pancreas better evaluated on the prior CT. Musculoskeletal: No acute osseous pathology. Review of the MIP images confirms the above findings. IMPRESSION: 1. No acute intrathoracic pathology. No CT evidence of pulmonary artery embolus. 2. Advanced 3 vessel coronary vascular calcification. 3. Biliary stent and pneumobilia. 4. Aortic Atherosclerosis (ICD10-I70.0). Electronically Signed   By: Anner Crete M.D.   On: 08/01/2021 17:32   CT ABDOMEN PELVIS W CONTRAST  Result Date: 08/01/2021 CLINICAL DATA:  Epigastric pain.  Pancreatic cancer. EXAM: CT ABDOMEN AND PELVIS WITH CONTRAST TECHNIQUE: Multidetector CT imaging of the abdomen and pelvis was performed using the standard protocol following bolus administration of intravenous contrast. CONTRAST:  38mL OMNIPAQUE IOHEXOL 350 MG/ML SOLN COMPARISON:  There is atelectasis in the lung bases. FINDINGS: Lower chest: There is atelectasis in the lung bases. Hepatobiliary: Gallbladder surgically absent. Pneumobilia is again noted. No focal liver lesions are seen. Pancreas: Hypodense mass in the uncinate process of the pancreas has  decreased in size in has less discrete margins now measuring proximally 1.3 by 1.6 cm (previously 3.2 x 2.3 cm). Atrophy of the pancreatic body and tail has progressed. Pancreatic ductal dilatation has decreased. There is a small focus of air in the pancreatic duct, presumably from  stent at the level of the pancreatic head. Spleen: Normal in size without focal abnormality. Adrenals/Urinary Tract: Adrenal glands are unremarkable. Kidneys are normal, without renal calculi, focal lesion, or hydronephrosis. Bladder is unremarkable. Stomach/Bowel: Is within normal limits. There is no wall thickening, inflammatory stranding or pneumatosis. No free air identified. Appendix is not seen. Vascular/Lymphatic: Aortic atherosclerosis. No enlarged abdominal or pelvic lymph nodes. Reproductive: Prostate gland is enlarged, unchanged. Other: There are surgical clips in the left inguinal region. There is no ascites or focal abdominal wall hernia. Musculoskeletal: No acute or significant osseous findings. IMPRESSION: 1. There is diffuse gaseous distension of large and small bowel which may be related to mild ileus. There is no bowel obstruction or focal inflammation. 2. Pancreatic head mass has decreased in size. Stent remains in place. Electronically Signed   By: Ronney Asters M.D.   On: 08/01/2021 17:32    Procedures Procedures   Medications Ordered in ED Medications  sodium chloride 0.9 % bolus 1,000 mL (0 mLs Intravenous Stopped 08/01/21 1757)  pantoprazole (PROTONIX) injection 40 mg (40 mg Intravenous Given 08/01/21 1654)  iohexol (OMNIPAQUE) 350 MG/ML injection 75 mL (75 mLs Intravenous Contrast Given 08/01/21 1704)    ED Course  I have reviewed the triage vital signs and the nursing notes.  Pertinent labs & imaging results that were available during my care of the patient were reviewed by me and considered in my medical decision making (see chart for details).    MDM Rules/Calculators/A&P                           CC: epigastric pain  This patient complains of above; this involves an extensive number of treatment options and is a complaint that carries with it a high risk of complications and morbidity. Vital signs were reviewed. Serious etiologies considered.  Record review:   Previous records obtained and reviewed    Work up as above, notable for:  Labs & imaging results that were available during my care of the patient were reviewed by me and considered in my medical decision making.   I ordered imaging studies which included CT pe CT a/p and I independently visualized and interpreted imaging which showed possible mild ileus, otherwise WNL  Patient was low risk [place], D-dimer is elevated.  CT PE was obtained.  No evidence of acute PE on imaging  Management: Patient given IV fluids, IV Protonix  Reassessment:  Patient does report that he is feeling better at this time.  He has no longer having any type of pleuritic midepigastric discomfort.  He has no chest pain whatsoever and did not have chest pain at any point during his ER evaluation.  Vital signs remained stable.  He is able to oral intake.  He is ambulatory.   Unclear etiology of patient's epigastric discomfort, favor acid reflux versus possible mild gastritis given resolution of symptoms following Protonix.  Recommend bland diet, acid use medications over the next few days.  He has appoint with his oncologist on Monday.  Discussed strict return precautions and advised to return to the ER for any worsening worrisome symptoms.  The patient improved significantly and was discharged in stable  condition. Detailed discussions were had with the patient regarding current findings, and need for close f/u with PCP or on call doctor. The patient has been instructed to return immediately if the symptoms worsen in any way for re-evaluation. Patient verbalized understanding and is in agreement with current care plan. All questions answered prior to discharge.           This chart was dictated using voice recognition software.  Despite best efforts to proofread,  errors can occur which can change the documentation meaning.  Final Clinical Impression(s) / ED Diagnoses Final diagnoses:  Epigastric pain    Rx /  DC Orders ED Discharge Orders          Ordered    sucralfate (CARAFATE) 1 g tablet  3 times daily with meals & bedtime        08/01/21 1808             Jeanell Sparrow, DO 08/01/21 1816

## 2021-08-02 ENCOUNTER — Encounter: Payer: Self-pay | Admitting: *Deleted

## 2021-08-02 NOTE — Progress Notes (Signed)
PATIENT NAVIGATOR PROGRESS NOTE  Name: David Jarvis Date: 08/02/2021 MRN: [id number]  DOB: [birth year]   Reason for visit:  Phone call after ER Visit   Comments:  Spoke with patient after ER visit yesterday to R/O Pulmonary Embolism PE ruled out and chest discomfort with deep breaths may be gastritis. Pt was given iv Protonix and sent home with carafate prescription which he did not fill. He states that he is not having any discomfort today. Also had CT abdomen which showed decrease in size of pancreatic mass.  David Jarvis will return to clinic on Monday for cycle 3     Time spent counseling/coordinating care: 15-30 minutes

## 2021-08-04 ENCOUNTER — Other Ambulatory Visit: Payer: Self-pay | Admitting: Oncology

## 2021-08-06 ENCOUNTER — Inpatient Hospital Stay: Payer: Medicare HMO

## 2021-08-06 ENCOUNTER — Inpatient Hospital Stay: Payer: Medicare HMO | Attending: Oncology

## 2021-08-06 ENCOUNTER — Inpatient Hospital Stay: Payer: Medicare HMO | Admitting: Nurse Practitioner

## 2021-08-06 ENCOUNTER — Other Ambulatory Visit (HOSPITAL_BASED_OUTPATIENT_CLINIC_OR_DEPARTMENT_OTHER): Payer: Self-pay

## 2021-08-06 ENCOUNTER — Encounter: Payer: Self-pay | Admitting: Nurse Practitioner

## 2021-08-06 ENCOUNTER — Other Ambulatory Visit: Payer: Self-pay

## 2021-08-06 VITALS — BP 133/81 | HR 59

## 2021-08-06 VITALS — BP 155/86 | HR 66 | Temp 98.2°F | Resp 18 | Ht 66.0 in | Wt 158.0 lb

## 2021-08-06 DIAGNOSIS — Z79899 Other long term (current) drug therapy: Secondary | ICD-10-CM | POA: Insufficient documentation

## 2021-08-06 DIAGNOSIS — E785 Hyperlipidemia, unspecified: Secondary | ICD-10-CM | POA: Insufficient documentation

## 2021-08-06 DIAGNOSIS — C772 Secondary and unspecified malignant neoplasm of intra-abdominal lymph nodes: Secondary | ICD-10-CM | POA: Diagnosis present

## 2021-08-06 DIAGNOSIS — Z85828 Personal history of other malignant neoplasm of skin: Secondary | ICD-10-CM | POA: Insufficient documentation

## 2021-08-06 DIAGNOSIS — Z5111 Encounter for antineoplastic chemotherapy: Secondary | ICD-10-CM | POA: Insufficient documentation

## 2021-08-06 DIAGNOSIS — Z5189 Encounter for other specified aftercare: Secondary | ICD-10-CM | POA: Diagnosis not present

## 2021-08-06 DIAGNOSIS — C250 Malignant neoplasm of head of pancreas: Secondary | ICD-10-CM

## 2021-08-06 DIAGNOSIS — I10 Essential (primary) hypertension: Secondary | ICD-10-CM | POA: Insufficient documentation

## 2021-08-06 DIAGNOSIS — K219 Gastro-esophageal reflux disease without esophagitis: Secondary | ICD-10-CM | POA: Diagnosis not present

## 2021-08-06 LAB — CBC WITH DIFFERENTIAL (CANCER CENTER ONLY)
Abs Immature Granulocytes: 0.24 10*3/uL — ABNORMAL HIGH (ref 0.00–0.07)
Basophils Absolute: 0 10*3/uL (ref 0.0–0.1)
Basophils Relative: 0 %
Eosinophils Absolute: 0.1 10*3/uL (ref 0.0–0.5)
Eosinophils Relative: 1 %
HCT: 30.1 % — ABNORMAL LOW (ref 39.0–52.0)
Hemoglobin: 9.9 g/dL — ABNORMAL LOW (ref 13.0–17.0)
Immature Granulocytes: 3 %
Lymphocytes Relative: 24 %
Lymphs Abs: 1.9 10*3/uL (ref 0.7–4.0)
MCH: 30.4 pg (ref 26.0–34.0)
MCHC: 32.9 g/dL (ref 30.0–36.0)
MCV: 92.3 fL (ref 80.0–100.0)
Monocytes Absolute: 0.7 10*3/uL (ref 0.1–1.0)
Monocytes Relative: 9 %
Neutro Abs: 5 10*3/uL (ref 1.7–7.7)
Neutrophils Relative %: 63 %
Platelet Count: 132 10*3/uL — ABNORMAL LOW (ref 150–400)
RBC: 3.26 MIL/uL — ABNORMAL LOW (ref 4.22–5.81)
RDW: 17.2 % — ABNORMAL HIGH (ref 11.5–15.5)
WBC Count: 7.9 10*3/uL (ref 4.0–10.5)
nRBC: 0.4 % — ABNORMAL HIGH (ref 0.0–0.2)

## 2021-08-06 LAB — CMP (CANCER CENTER ONLY)
ALT: 14 U/L (ref 0–44)
AST: 16 U/L (ref 15–41)
Albumin: 3.8 g/dL (ref 3.5–5.0)
Alkaline Phosphatase: 106 U/L (ref 38–126)
Anion gap: 9 (ref 5–15)
BUN: 17 mg/dL (ref 8–23)
CO2: 26 mmol/L (ref 22–32)
Calcium: 8.9 mg/dL (ref 8.9–10.3)
Chloride: 105 mmol/L (ref 98–111)
Creatinine: 1.21 mg/dL (ref 0.61–1.24)
GFR, Estimated: >60 mL/min (ref >60)
Glucose, Bld: 135 mg/dL — ABNORMAL HIGH (ref 70–99)
Potassium: 4 mmol/L (ref 3.5–5.1)
Sodium: 140 mmol/L (ref 135–145)
Total Bilirubin: 0.4 mg/dL (ref 0.3–1.2)
Total Protein: 5.7 g/dL — ABNORMAL LOW (ref 6.5–8.1)

## 2021-08-06 LAB — MAGNESIUM: Magnesium: 1.6 mg/dL — ABNORMAL LOW (ref 1.7–2.4)

## 2021-08-06 MED ORDER — SODIUM CHLORIDE 0.9 % IV SOLN
150.0000 mg | Freq: Once | INTRAVENOUS | Status: AC
  Administered 2021-08-06: 150 mg via INTRAVENOUS
  Filled 2021-08-06: qty 5

## 2021-08-06 MED ORDER — ATROPINE SULFATE 1 MG/ML IV SOLN
0.5000 mg | Freq: Once | INTRAVENOUS | Status: AC | PRN
  Administered 2021-08-06: 0.5 mg via INTRAVENOUS
  Filled 2021-08-06: qty 1

## 2021-08-06 MED ORDER — PALONOSETRON HCL INJECTION 0.25 MG/5ML
0.2500 mg | Freq: Once | INTRAVENOUS | Status: AC
  Administered 2021-08-06: 0.25 mg via INTRAVENOUS
  Filled 2021-08-06: qty 5

## 2021-08-06 MED ORDER — SODIUM CHLORIDE 0.9 % IV SOLN
120.0000 mg/m2 | Freq: Once | INTRAVENOUS | Status: AC
  Administered 2021-08-06: 220 mg via INTRAVENOUS
  Filled 2021-08-06: qty 6

## 2021-08-06 MED ORDER — DEXTROSE 5 % IV SOLN: Freq: Once | INTRAVENOUS | Status: AC

## 2021-08-06 MED ORDER — SODIUM CHLORIDE 0.9 % IV SOLN
2000.0000 mg/m2 | INTRAVENOUS | Status: DC
  Administered 2021-08-06: 3650 mg via INTRAVENOUS
  Filled 2021-08-06: qty 73

## 2021-08-06 MED ORDER — SODIUM CHLORIDE 0.9 % IV SOLN
400.0000 mg/m2 | Freq: Once | INTRAVENOUS | Status: AC
  Administered 2021-08-06: 728 mg via INTRAVENOUS
  Filled 2021-08-06: qty 36.4

## 2021-08-06 MED ORDER — SODIUM CHLORIDE 0.9 % IV SOLN
10.0000 mg | Freq: Once | INTRAVENOUS | Status: AC
  Administered 2021-08-06: 10 mg via INTRAVENOUS
  Filled 2021-08-06: qty 1

## 2021-08-06 MED ORDER — OXALIPLATIN CHEMO INJECTION 100 MG/20ML
85.0000 mg/m2 | Freq: Once | INTRAVENOUS | Status: AC
  Administered 2021-08-06: 155 mg via INTRAVENOUS
  Filled 2021-08-06: qty 31

## 2021-08-06 NOTE — Progress Notes (Signed)
[facility] OFFICE PROGRESS NOTE   Diagnosis: Pancreas cancer  INTERVAL HISTORY:   David Jarvis returns as scheduled.  He completed cycle 3 FOLFIRINOX 07/23/2021.  He was seen in the emergency department 08/01/2021 for complaints of pleuritic chest pain.  CT chest negative for PE.  CT abdomen/pelvis showed a pancreatic head mass was decreased in size.  There was diffuse gaseous distention of large and small bowel, no bowel obstruction or focal inflammation.  He reports the pain has resolved.  He did not have nausea or vomiting after cycle 3 FOLFIRINOX.  No mouth sores.  No significant diarrhea.  Cold sensitivity lasted about 3 days.  No persistent neuropathy symptoms.  He developed fatigue 07/29/2021.  This lasted about 1 day.  Objective:  Vital signs in last 24 hours:  Blood pressure (!) 155/86, pulse 66, temperature 98.2 F (36.8 C), temperature source Oral, resp. rate 18, height 5\' 6"  (1.676 m), weight 158 lb (71.7 kg), SpO2 100 %.    HEENT: No thrush or ulcers. Resp: Lungs clear bilaterally. Cardio: Regular rate and rhythm. GI: Abdomen soft and nontender.  No hepatomegaly. Vascular: No leg edema. Neuro: Vibratory sense mildly decreased over the fingertips per tuning fork exam. Skin: Palms without erythema. Port-A-Cath without erythema.   Lab Results:  Lab Results  Component Value Date   WBC 7.9 08/06/2021   HGB 9.9 (L) 08/06/2021   HCT 30.1 (L) 08/06/2021   MCV 92.3 08/06/2021   PLT 132 (L) 08/06/2021   NEUTROABS 5.0 08/06/2021    Imaging:  No results found.  Medications: I have reviewed the patient's current medications.  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, stent placed EUS 05/23/2021-25 x 15 mm pancreas head mass with  irregular borders, no lymphadenopathy, no vascular involvement, T2 N0 by ultrasound CT chest 06/01/2021-negative for metastatic disease CT abdomen pancreas protocol 06/01/2021-3.2 x 2.3 cm hypoenhancing pancreas head mass inseparable from the third portion of the duodenum, 1 cm left retroperitoneal lymph node, no vascular involvement Cycle 1 FOLFIRINOX 06/19/2021 Cycle 2 FOLFIRINOX 07/02/2021, irinotecan and 5-FU dose reduced, prophylactic dexamethasone added beginning day of pump discontinuation Cycle 3 FOLFIRINOX 07/23/2021, Udenyca CT abdomen/pelvis 08/01/2021-done in the emergency department to evaluate epigastric pain-pancreas head mass decreased in size.  Diffuse gaseous distention of large and small bowel.  No bowel obstruction or focal inflammation. Cycle 4 FOLFIRINOX 08/06/2021, Udenyca Obstructive jaundice secondary #1 Hypertension Admission in February 2022 with elevated liver enzymes MRI/MRCP-intra and extrahepatic biliary duct dilation, tapering smoothly to the ampulla, no mass or filling defect ERCP 10/06/2020-dilated main bile duct, biliary tree was swept and nothing was found, cytology from bile duct brushing-no malignant cells identified 5.  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  GERD 7.  Squamous cell carcinoma left forearm 2014 8.  Hyperlipidemia 9.  CT chest 08/01/2021, done in the emergency department to evaluate pleuritic chest pain-no PE.  Advanced three-vessel coronary vascular calcification.    Disposition: David Jarvis appears stable.  He has completed 3 cycles of FOLFIRINOX.  Plan to proceed with cycle 4 today as scheduled.  CBC and chemistry panel reviewed.  Labs adequate to proceed with treatment.  He has mild hypomagnesia.  He will increase magnesium from 400 mg once daily to twice daily.  He will return for lab, follow-up, FOLFIRINOX in 2 weeks.  He will contact  the office in the interim with any problems.   Ned Card ANP/GNP-BC   08/06/2021  9:41  AM

## 2021-08-06 NOTE — Progress Notes (Signed)
Patient presents for treatment. RN assessment completed along with the following:  Labs/vitals reviewed - Yes, and within treatment parameters.   Weight within 10% of previous measurement - Yes Oncology Treatment Attestation completed for current therapy- Yes, on date 05/25/21 Informed consent completed and reflects current therapy/intent - Yes, on date 06/19/21             Provider progress note reviewed - Today's provider note is not yet available. I reviewed the most recent oncology provider progress note in chart dated 07/23/21. Treatment/Antibody/Supportive plan reviewed - Yes, and there are no adjustments needed for today's treatment. S&H and other orders reviewed - Yes, and there are no additional orders identified. Previous treatment date reviewed - Yes, and the appropriate amount of time has elapsed between treatments. Clinic Hand Off Received from - [place]  Patient to proceed with treatment.

## 2021-08-06 NOTE — Patient Instructions (Signed)
[facility]  The chemotherapy medication bag should finish at 46 hours, 96 hours, or 7 days. For example, if your pump is scheduled for 46 hours and it was put on at 4:00 p.m., it should finish at 2:00 p.m. the day it is scheduled to come off regardless of your appointment time.     Estimated time to finish at 2:00 Wednesday, August 08, 2021.   If the display on your pump reads "Low Volume" and it is beeping, take the batteries out of the pump and come to the cancer center for it to be taken off.   If the pump alarms go off prior to the pump reading "Low Volume" then call [phone] and someone can assist you.  If the plunger comes out and the chemotherapy medication is leaking out, please use your home chemo spill kit to clean up the spill. Do NOT use paper towels or other household products.  If you have problems or questions regarding your pump, please call either [phone] (24 hours a day) or the cancer center Monday-Friday 8:00 a.m.- 4:30 p.m. at the clinic number and we will assist you. If you are unable to get assistance, then go to the nearest Emergency Department and ask the staff to contact the IV team for assistance.   Discharge Instructions: Thank you for choosing [facility] to provide your oncology and hematology care.   If you have a lab appointment with the [facility], please go directly to the [facility] and check in at the registration area.   Wear comfortable clothing and clothing appropriate for easy access to any Portacath or PICC line.   We strive to give you quality time with your provider. You may need to reschedule your appointment if you arrive late (15 or more minutes).  Arriving late affects you and other patients whose appointments are after yours.  Also, if you miss three or more appointments without notifying the office, you may be dismissed from the clinic at the provider's discretion.      For prescription  refill requests, have your pharmacy contact our office and allow 72 hours for refills to be completed.    Today you received the following chemotherapy and/or immunotherapy agents oxaliplatin, leucovorin, irinotecan, fluorouracil      To help prevent nausea and vomiting after your treatment, we encourage you to take your nausea medication as directed.  BELOW ARE SYMPTOMS THAT SHOULD BE REPORTED IMMEDIATELY: *FEVER GREATER THAN 100.4 F (38 C) OR HIGHER *CHILLS OR SWEATING *NAUSEA AND VOMITING THAT IS NOT CONTROLLED WITH YOUR NAUSEA MEDICATION *UNUSUAL SHORTNESS OF BREATH *UNUSUAL BRUISING OR BLEEDING *URINARY PROBLEMS (pain or burning when urinating, or frequent urination) *BOWEL PROBLEMS (unusual diarrhea, constipation, pain near the anus) TENDERNESS IN MOUTH AND THROAT WITH OR WITHOUT PRESENCE OF ULCERS (sore throat, sores in mouth, or a toothache) UNUSUAL RASH, SWELLING OR PAIN  UNUSUAL VAGINAL DISCHARGE OR ITCHING   Items with * indicate a potential emergency and should be followed up as soon as possible or go to the Emergency Department if any problems should occur.  Please show the CHEMOTHERAPY ALERT CARD or IMMUNOTHERAPY ALERT CARD at check-in to the Emergency Department and triage nurse.  Should you have questions after your visit or need to cancel or reschedule your appointment, please contact [facility]  Dept: [phone]  and follow the prompts.  Office hours are 8:00 a.m. to 4:30 p.m. Monday - Friday. Please note that  voicemails left after 4:00 p.m. may not be returned until the following business day.  We are closed weekends and major holidays. You have access to a nurse at all times for urgent questions. Please call the main number to the clinic Dept: [phone] and follow the prompts.   For any non-urgent questions, you may also contact your provider using MyChart. We now offer e-Visits for anyone 62 and older to request care online for  non-urgent symptoms. For details visit [email or web address].   Also download the MyChart app! Go to the app store, search "MyChart", open the app, select [facility], and log in with your MyChart username and password.  Due to Covid, a mask is required upon entering the hospital/clinic. If you do not have a mask, one will be given to you upon arrival. For doctor visits, patients may have 1 support person aged 34 or older with them. For treatment visits, patients cannot have anyone with them due to current Covid guidelines and our immunocompromised population.   Oxaliplatin Injection What is this medication? OXALIPLATIN (ox AL i PLA tin) is a chemotherapy drug. It targets fast dividing cells, like cancer cells, and causes these cells to die. This medicine is used to treat cancers of the colon and rectum, and many other cancers. This medicine may be used for other purposes; ask your health care provider or pharmacist if you have questions. COMMON BRAND NAME(S): Eloxatin What should I tell my care team before I take this medication? They need to know if you have any of these conditions: heart disease history of irregular heartbeat liver disease low blood counts, like white cells, platelets, or red blood cells lung or breathing disease, like asthma take medicines that treat or prevent blood clots tingling of the fingers or toes, or other nerve disorder an unusual or allergic reaction to oxaliplatin, other chemotherapy, other medicines, foods, dyes, or preservatives pregnant or trying to get pregnant breast-feeding How should I use this medication? This drug is given as an infusion into a vein. It is administered in a hospital or clinic by a specially trained health care professional. Talk to your pediatrician regarding the use of this medicine in children. Special care may be needed. Overdosage: If you think you have taken too much of this medicine contact a poison control center or emergency  room at once. NOTE: This medicine is only for you. Do not share this medicine with others. What if I miss a dose? It is important not to miss a dose. Call your doctor or health care professional if you are unable to keep an appointment. What may interact with this medication? Do not take this medicine with any of the following medications: cisapride dronedarone pimozide thioridazine This medicine may also interact with the following medications: aspirin and aspirin-like medicines certain medicines that treat or prevent blood clots like warfarin, apixaban, dabigatran, and rivaroxaban cisplatin cyclosporine diuretics medicines for infection like acyclovir, adefovir, amphotericin B, bacitracin, cidofovir, foscarnet, ganciclovir, gentamicin, pentamidine, vancomycin NSAIDs, medicines for pain and inflammation, like ibuprofen or naproxen other medicines that prolong the QT interval (an abnormal heart rhythm) pamidronate zoledronic acid This list may not describe all possible interactions. Give your health care provider a list of all the medicines, herbs, non-prescription drugs, or dietary supplements you use. Also tell them if you smoke, drink alcohol, or use illegal drugs. Some items may interact with your medicine. What should I watch for while using this medication? Your condition will be monitored carefully while you are receiving  this medicine. You may need blood work done while you are taking this medicine. This medicine may make you feel generally unwell. This is not uncommon as chemotherapy can affect healthy cells as well as cancer cells. Report any side effects. Continue your course of treatment even though you feel ill unless your healthcare professional tells you to stop. This medicine can make you more sensitive to cold. Do not drink cold drinks or use ice. Cover exposed skin before coming in contact with cold temperatures or cold objects. When out in cold weather wear warm clothing  and cover your mouth and nose to warm the air that goes into your lungs. Tell your doctor if you get sensitive to the cold. Do not become pregnant while taking this medicine or for 9 months after stopping it. Women should inform their health care professional if they wish to become pregnant or think they might be pregnant. Men should not father a child while taking this medicine and for 6 months after stopping it. There is potential for serious side effects to an unborn child. Talk to your health care professional for more information. Do not breast-feed a child while taking this medicine or for 3 months after stopping it. This medicine has caused ovarian failure in some women. This medicine may make it more difficult to get pregnant. Talk to your health care professional if you are concerned about your fertility. This medicine has caused decreased sperm counts in some men. This may make it more difficult to father a child. Talk to your health care professional if you are concerned about your fertility. This medicine may increase your risk of getting an infection. Call your health care professional for advice if you get a fever, chills, or sore throat, or other symptoms of a cold or flu. Do not treat yourself. Try to avoid being around people who are sick. Avoid taking medicines that contain aspirin, acetaminophen, ibuprofen, naproxen, or ketoprofen unless instructed by your health care professional. These medicines may hide a fever. Be careful brushing or flossing your teeth or using a toothpick because you may get an infection or bleed more easily. If you have any dental work done, tell your dentist you are receiving this medicine. What side effects may I notice from receiving this medication? Side effects that you should report to your doctor or health care professional as soon as possible: allergic reactions like skin rash, itching or hives, swelling of the face, lips, or tongue breathing  problems cough low blood counts - this medicine may decrease the number of white blood cells, red blood cells, and platelets. You may be at increased risk for infections and bleeding nausea, vomiting pain, redness, or irritation at site where injected pain, tingling, numbness in the hands or feet signs and symptoms of bleeding such as bloody or black, tarry stools; red or dark brown urine; spitting up blood or brown material that looks like coffee grounds; red spots on the skin; unusual bruising or bleeding from the eyes, gums, or nose signs and symptoms of a dangerous change in heartbeat or heart rhythm like chest pain; dizziness; fast, irregular heartbeat; palpitations; feeling faint or lightheaded; falls signs and symptoms of infection like fever; chills; cough; sore throat; pain or trouble passing urine signs and symptoms of liver injury like dark yellow or brown urine; general ill feeling or flu-like symptoms; light-colored stools; loss of appetite; nausea; right upper belly pain; unusually weak or tired; yellowing of the eyes or skin signs and symptoms  of low red blood cells or anemia such as unusually weak or tired; feeling faint or lightheaded; falls signs and symptoms of muscle injury like dark urine; trouble passing urine or change in the amount of urine; unusually weak or tired; muscle pain; back pain Side effects that usually do not require medical attention (report to your doctor or health care professional if they continue or are bothersome): changes in taste diarrhea gas hair loss loss of appetite mouth sores This list may not describe all possible side effects. Call your doctor for medical advice about side effects. You may report side effects to FDA at 1-800-FDA-1088. Where should I keep my medication? This drug is given in a hospital or clinic and will not be stored at home. NOTE: This sheet is a summary. It may not cover all possible information. If you have questions about  this medicine, talk to your doctor, pharmacist, or health care provider.  2022 Elsevier/Gold Standard (2021-05-08 00:00:00)  Leucovorin injection What is this medication? LEUCOVORIN (loo koe VOR in) is used to prevent or treat the harmful effects of some medicines. This medicine is used to treat anemia caused by a low amount of folic acid in the body. It is also used with 5-fluorouracil (5-FU) to treat colon cancer. This medicine may be used for other purposes; ask your health care provider or pharmacist if you have questions. What should I tell my care team before I take this medication? They need to know if you have any of these conditions: anemia from low levels of vitamin B-12 in the blood an unusual or allergic reaction to leucovorin, folic acid, other medicines, foods, dyes, or preservatives pregnant or trying to get pregnant breast-feeding How should I use this medication? This medicine is for injection into a muscle or into a vein. It is given by a health care professional in a hospital or clinic setting. Talk to your pediatrician regarding the use of this medicine in children. Special care may be needed. Overdosage: If you think you have taken too much of this medicine contact a poison control center or emergency room at once. NOTE: This medicine is only for you. Do not share this medicine with others. What if I miss a dose? This does not apply. What may interact with this medication? capecitabine fluorouracil phenobarbital phenytoin primidone trimethoprim-sulfamethoxazole This list may not describe all possible interactions. Give your health care provider a list of all the medicines, herbs, non-prescription drugs, or dietary supplements you use. Also tell them if you smoke, drink alcohol, or use illegal drugs. Some items may interact with your medicine. What should I watch for while using this medication? Your condition will be monitored carefully while you are receiving this  medicine. This medicine may increase the side effects of 5-fluorouracil, 5-FU. Tell your doctor or health care professional if you have diarrhea or mouth sores that do not get better or that get worse. What side effects may I notice from receiving this medication? Side effects that you should report to your doctor or health care professional as soon as possible: allergic reactions like skin rash, itching or hives, swelling of the face, lips, or tongue breathing problems fever, infection mouth sores unusual bleeding or bruising unusually weak or tired Side effects that usually do not require medical attention (report to your doctor or health care professional if they continue or are bothersome): constipation or diarrhea loss of appetite nausea, vomiting This list may not describe all possible side effects. Call your doctor for  medical advice about side effects. You may report side effects to FDA at 1-800-FDA-1088. Where should I keep my medication? This drug is given in a hospital or clinic and will not be stored at home. NOTE: This sheet is a summary. It may not cover all possible information. If you have questions about this medicine, talk to your doctor, pharmacist, or health care provider.  2022 Elsevier/Gold Standard (2008-02-25 00:00:00)  Irinotecan injection What is this medication? IRINOTECAN (ir in oh TEE kan ) is a chemotherapy drug. It is used to treat colon and rectal cancer. This medicine may be used for other purposes; ask your health care provider or pharmacist if you have questions. COMMON BRAND NAME(S): Camptosar What should I tell my care team before I take this medication? They need to know if you have any of these conditions: dehydration diarrhea infection (especially a virus infection such as chickenpox, cold sores, or herpes) liver disease low blood counts, like low white cell, platelet, or red cell counts low levels of calcium, magnesium, or potassium in the  blood recent or ongoing radiation therapy an unusual or allergic reaction to irinotecan, other medicines, foods, dyes, or preservatives pregnant or trying to get pregnant breast-feeding How should I use this medication? This drug is given as an infusion into a vein. It is administered in a hospital or clinic by a specially trained health care professional. Talk to your pediatrician regarding the use of this medicine in children. Special care may be needed. Overdosage: If you think you have taken too much of this medicine contact a poison control center or emergency room at once. NOTE: This medicine is only for you. Do not share this medicine with others. What if I miss a dose? It is important not to miss your dose. Call your doctor or health care professional if you are unable to keep an appointment. What may interact with this medication? Do not take this medicine with any of the following medications: cobicistat itraconazole This medicine may interact with the following medications: antiviral medicines for HIV or AIDS certain antibiotics like rifampin or rifabutin certain medicines for fungal infections like ketoconazole, posaconazole, and voriconazole certain medicines for seizures like carbamazepine, phenobarbital, phenotoin clarithromycin gemfibrozil nefazodone [place]'s Wort This list may not describe all possible interactions. Give your health care provider a list of all the medicines, herbs, non-prescription drugs, or dietary supplements you use. Also tell them if you smoke, drink alcohol, or use illegal drugs. Some items may interact with your medicine. What should I watch for while using this medication? Your condition will be monitored carefully while you are receiving this medicine. You will need important blood work done while you are taking this medicine. This drug may make you feel generally unwell. This is not uncommon, as chemotherapy can affect healthy cells as well as  cancer cells. Report any side effects. Continue your course of treatment even though you feel ill unless your doctor tells you to stop. In some cases, you may be given additional medicines to help with side effects. Follow all directions for their use. You may get drowsy or dizzy. Do not drive, use machinery, or do anything that needs mental alertness until you know how this medicine affects you. Do not stand or sit up quickly, especially if you are an older patient. This reduces the risk of dizzy or fainting spells. Call your health care professional for advice if you get a fever, chills, or sore throat, or other symptoms of a cold  or flu. Do not treat yourself. This medicine decreases your body's ability to fight infections. Try to avoid being around people who are sick. Avoid taking products that contain aspirin, acetaminophen, ibuprofen, naproxen, or ketoprofen unless instructed by your doctor. These medicines may hide a fever. This medicine may increase your risk to bruise or bleed. Call your doctor or health care professional if you notice any unusual bleeding. Be careful brushing and flossing your teeth or using a toothpick because you may get an infection or bleed more easily. If you have any dental work done, tell your dentist you are receiving this medicine. Do not become pregnant while taking this medicine or for 6 months after stopping it. Women should inform their health care professional if they wish to become pregnant or think they might be pregnant. Men should not father a child while taking this medicine and for 3 months after stopping it. There is potential for serious side effects to an unborn child. Talk to your health care professional for more information. Do not breast-feed an infant while taking this medicine or for 7 days after stopping it. This medicine has caused ovarian failure in some women. This medicine may make it more difficult to get pregnant. Talk to your health care  professional if you are concerned about your fertility. This medicine has caused decreased sperm counts in some men. This may make it more difficult to father a child. Talk to your health care professional if you are concerned about your fertility. What side effects may I notice from receiving this medication? Side effects that you should report to your doctor or health care professional as soon as possible: allergic reactions like skin rash, itching or hives, swelling of the face, lips, or tongue chest pain diarrhea flushing, runny nose, sweating during infusion low blood counts - this medicine may decrease the number of white blood cells, red blood cells and platelets. You may be at increased risk for infections and bleeding. nausea, vomiting pain, swelling, warmth in the leg signs of decreased platelets or bleeding - bruising, pinpoint red spots on the skin, black, tarry stools, blood in the urine signs of infection - fever or chills, cough, sore throat, pain or difficulty passing urine signs of decreased red blood cells - unusually weak or tired, fainting spells, lightheadedness Side effects that usually do not require medical attention (report to your doctor or health care professional if they continue or are bothersome): constipation hair loss headache loss of appetite mouth sores stomach pain This list may not describe all possible side effects. Call your doctor for medical advice about side effects. You may report side effects to FDA at 1-800-FDA-1088. Where should I keep my medication? This drug is given in a hospital or clinic and will not be stored at home. NOTE: This sheet is a summary. It may not cover all possible information. If you have questions about this medicine, talk to your doctor, pharmacist, or health care provider.  2022 Elsevier/Gold Standard (2021-05-08 00:00:00)  Fluorouracil, 5-FU injection What is this medication? FLUOROURACIL, 5-FU (flure oh YOOR a sil) is  a chemotherapy drug. It slows the growth of cancer cells. This medicine is used to treat many types of cancer like breast cancer, colon or rectal cancer, pancreatic cancer, and stomach cancer. This medicine may be used for other purposes; ask your health care provider or pharmacist if you have questions. COMMON BRAND NAME(S): Adrucil What should I tell my care team before I take this medication? They need  to know if you have any of these conditions: blood disorders dihydropyrimidine dehydrogenase (DPD) deficiency infection (especially a virus infection such as chickenpox, cold sores, or herpes) kidney disease liver disease malnourished, poor nutrition recent or ongoing radiation therapy an unusual or allergic reaction to fluorouracil, other chemotherapy, other medicines, foods, dyes, or preservatives pregnant or trying to get pregnant breast-feeding How should I use this medication? This drug is given as an infusion or injection into a vein. It is administered in a hospital or clinic by a specially trained health care professional. Talk to your pediatrician regarding the use of this medicine in children. Special care may be needed. Overdosage: If you think you have taken too much of this medicine contact a poison control center or emergency room at once. NOTE: This medicine is only for you. Do not share this medicine with others. What if I miss a dose? It is important not to miss your dose. Call your doctor or health care professional if you are unable to keep an appointment. What may interact with this medication? Do not take this medicine with any of the following medications: live virus vaccines This medicine may also interact with the following medications: medicines that treat or prevent blood clots like warfarin, enoxaparin, and dalteparin This list may not describe all possible interactions. Give your health care provider a list of all the medicines, herbs, non-prescription drugs,  or dietary supplements you use. Also tell them if you smoke, drink alcohol, or use illegal drugs. Some items may interact with your medicine. What should I watch for while using this medication? Visit your doctor for checks on your progress. This drug may make you feel generally unwell. This is not uncommon, as chemotherapy can affect healthy cells as well as cancer cells. Report any side effects. Continue your course of treatment even though you feel ill unless your doctor tells you to stop. In some cases, you may be given additional medicines to help with side effects. Follow all directions for their use. Call your doctor or health care professional for advice if you get a fever, chills or sore throat, or other symptoms of a cold or flu. Do not treat yourself. This drug decreases your body's ability to fight infections. Try to avoid being around people who are sick. This medicine may increase your risk to bruise or bleed. Call your doctor or health care professional if you notice any unusual bleeding. Be careful brushing and flossing your teeth or using a toothpick because you may get an infection or bleed more easily. If you have any dental work done, tell your dentist you are receiving this medicine. Avoid taking products that contain aspirin, acetaminophen, ibuprofen, naproxen, or ketoprofen unless instructed by your doctor. These medicines may hide a fever. Do not become pregnant while taking this medicine. Women should inform their doctor if they wish to become pregnant or think they might be pregnant. There is a potential for serious side effects to an unborn child. Talk to your health care professional or pharmacist for more information. Do not breast-feed an infant while taking this medicine. Men should inform their doctor if they wish to father a child. This medicine may lower sperm counts. Do not treat diarrhea with over the counter products. Contact your doctor if you have diarrhea that lasts  more than 2 days or if it is severe and watery. This medicine can make you more sensitive to the sun. Keep out of the sun. If you cannot avoid being in  the sun, wear protective clothing and use sunscreen. Do not use sun lamps or tanning beds/booths. What side effects may I notice from receiving this medication? Side effects that you should report to your doctor or health care professional as soon as possible: allergic reactions like skin rash, itching or hives, swelling of the face, lips, or tongue low blood counts - this medicine may decrease the number of white blood cells, red blood cells and platelets. You may be at increased risk for infections and bleeding. signs of infection - fever or chills, cough, sore throat, pain or difficulty passing urine signs of decreased platelets or bleeding - bruising, pinpoint red spots on the skin, black, tarry stools, blood in the urine signs of decreased red blood cells - unusually weak or tired, fainting spells, lightheadedness breathing problems changes in vision chest pain mouth sores nausea and vomiting pain, swelling, redness at site where injected pain, tingling, numbness in the hands or feet redness, swelling, or sores on hands or feet stomach pain unusual bleeding Side effects that usually do not require medical attention (report to your doctor or health care professional if they continue or are bothersome): changes in finger or toe nails diarrhea dry or itchy skin hair loss headache loss of appetite sensitivity of eyes to the light stomach upset unusually teary eyes This list may not describe all possible side effects. Call your doctor for medical advice about side effects. You may report side effects to FDA at 1-800-FDA-1088. Where should I keep my medication? This drug is given in a hospital or clinic and will not be stored at home. NOTE: This sheet is a summary. It may not cover all possible information. If you have questions about  this medicine, talk to your doctor, pharmacist, or health care provider.  2022 Elsevier/Gold Standard (2021-05-08 00:00:00)

## 2021-08-06 NOTE — Progress Notes (Signed)
Patient seen by Lisa Thomas NP today  Vitals are within treatment parameters.  Labs reviewed by Lisa Thomas NP and are within treatment parameters.  Per physician team, patient is ready for treatment and there are NO modifications to the treatment plan.     

## 2021-08-08 ENCOUNTER — Inpatient Hospital Stay: Payer: Medicare HMO

## 2021-08-08 ENCOUNTER — Other Ambulatory Visit: Payer: Self-pay

## 2021-08-08 VITALS — BP 145/94 | HR 60 | Temp 98.5°F | Resp 20

## 2021-08-08 DIAGNOSIS — C250 Malignant neoplasm of head of pancreas: Secondary | ICD-10-CM | POA: Diagnosis not present

## 2021-08-08 DIAGNOSIS — Z85828 Personal history of other malignant neoplasm of skin: Secondary | ICD-10-CM | POA: Diagnosis not present

## 2021-08-08 DIAGNOSIS — E785 Hyperlipidemia, unspecified: Secondary | ICD-10-CM | POA: Diagnosis not present

## 2021-08-08 DIAGNOSIS — C772 Secondary and unspecified malignant neoplasm of intra-abdominal lymph nodes: Secondary | ICD-10-CM | POA: Diagnosis not present

## 2021-08-08 DIAGNOSIS — K219 Gastro-esophageal reflux disease without esophagitis: Secondary | ICD-10-CM | POA: Diagnosis not present

## 2021-08-08 DIAGNOSIS — I10 Essential (primary) hypertension: Secondary | ICD-10-CM | POA: Diagnosis not present

## 2021-08-08 DIAGNOSIS — Z5111 Encounter for antineoplastic chemotherapy: Secondary | ICD-10-CM | POA: Diagnosis not present

## 2021-08-08 DIAGNOSIS — Z5189 Encounter for other specified aftercare: Secondary | ICD-10-CM | POA: Diagnosis not present

## 2021-08-08 DIAGNOSIS — Z79899 Other long term (current) drug therapy: Secondary | ICD-10-CM | POA: Diagnosis not present

## 2021-08-08 MED ORDER — PEGFILGRASTIM-CBQV 6 MG/0.6ML ~~LOC~~ SOSY
6.0000 mg | PREFILLED_SYRINGE | Freq: Once | SUBCUTANEOUS | Status: AC
  Administered 2021-08-08: 6 mg via SUBCUTANEOUS

## 2021-08-08 MED ORDER — HEPARIN SOD (PORK) LOCK FLUSH 100 UNIT/ML IV SOLN
500.0000 [IU] | Freq: Once | INTRAVENOUS | Status: AC | PRN
  Administered 2021-08-08: 500 [IU]

## 2021-08-08 MED ORDER — SODIUM CHLORIDE 0.9% FLUSH
10.0000 mL | INTRAVENOUS | Status: DC | PRN
  Administered 2021-08-08: 10 mL

## 2021-08-08 NOTE — Patient Instructions (Signed)

## 2021-08-14 ENCOUNTER — Encounter (HOSPITAL_BASED_OUTPATIENT_CLINIC_OR_DEPARTMENT_OTHER): Payer: Self-pay | Admitting: Emergency Medicine

## 2021-08-14 ENCOUNTER — Other Ambulatory Visit: Payer: Self-pay

## 2021-08-14 ENCOUNTER — Emergency Department (HOSPITAL_BASED_OUTPATIENT_CLINIC_OR_DEPARTMENT_OTHER): Payer: Medicare HMO | Admitting: Radiology

## 2021-08-14 ENCOUNTER — Emergency Department (HOSPITAL_BASED_OUTPATIENT_CLINIC_OR_DEPARTMENT_OTHER)
Admission: EM | Admit: 2021-08-14 | Discharge: 2021-08-14 | Disposition: A | Discharge status: Home / Self Care | Payer: Medicare HMO | Attending: Emergency Medicine | Admitting: Emergency Medicine

## 2021-08-14 DIAGNOSIS — Z79899 Other long term (current) drug therapy: Secondary | ICD-10-CM | POA: Insufficient documentation

## 2021-08-14 DIAGNOSIS — Z7982 Long term (current) use of aspirin: Secondary | ICD-10-CM | POA: Diagnosis not present

## 2021-08-14 DIAGNOSIS — Z85828 Personal history of other malignant neoplasm of skin: Secondary | ICD-10-CM | POA: Insufficient documentation

## 2021-08-14 DIAGNOSIS — Z8507 Personal history of malignant neoplasm of pancreas: Secondary | ICD-10-CM | POA: Insufficient documentation

## 2021-08-14 DIAGNOSIS — R5383 Other fatigue: Secondary | ICD-10-CM | POA: Insufficient documentation

## 2021-08-14 DIAGNOSIS — R509 Fever, unspecified: Secondary | ICD-10-CM | POA: Diagnosis not present

## 2021-08-14 DIAGNOSIS — I10 Essential (primary) hypertension: Secondary | ICD-10-CM | POA: Insufficient documentation

## 2021-08-14 DIAGNOSIS — R300 Dysuria: Secondary | ICD-10-CM | POA: Insufficient documentation

## 2021-08-14 DIAGNOSIS — Z20822 Contact with and (suspected) exposure to covid-19: Secondary | ICD-10-CM | POA: Insufficient documentation

## 2021-08-14 DIAGNOSIS — R791 Abnormal coagulation profile: Secondary | ICD-10-CM | POA: Diagnosis not present

## 2021-08-14 DIAGNOSIS — Z0389 Encounter for observation for other suspected diseases and conditions ruled out: Secondary | ICD-10-CM | POA: Diagnosis not present

## 2021-08-14 LAB — CBC WITH DIFFERENTIAL/PLATELET
Abs Immature Granulocytes: 0.09 10*3/uL — ABNORMAL HIGH (ref 0.00–0.07)
Basophils Absolute: 0 10*3/uL (ref 0.0–0.1)
Basophils Relative: 0 %
Eosinophils Absolute: 0 10*3/uL (ref 0.0–0.5)
Eosinophils Relative: 1 %
HCT: 32.1 % — ABNORMAL LOW (ref 39.0–52.0)
Hemoglobin: 10.7 g/dL — ABNORMAL LOW (ref 13.0–17.0)
Immature Granulocytes: 1 %
Lymphocytes Relative: 7 %
Lymphs Abs: 0.5 10*3/uL — ABNORMAL LOW (ref 0.7–4.0)
MCH: 30.1 pg (ref 26.0–34.0)
MCHC: 33.3 g/dL (ref 30.0–36.0)
MCV: 90.2 fL (ref 80.0–100.0)
Monocytes Absolute: 0.8 10*3/uL (ref 0.1–1.0)
Monocytes Relative: 13 %
Neutro Abs: 4.8 10*3/uL (ref 1.7–7.7)
Neutrophils Relative %: 78 %
Platelets: 169 10*3/uL (ref 150–400)
RBC: 3.56 MIL/uL — ABNORMAL LOW (ref 4.22–5.81)
RDW: 16.6 % — ABNORMAL HIGH (ref 11.5–15.5)
WBC: 6 10*3/uL (ref 4.0–10.5)
nRBC: 0 % (ref 0.0–0.2)

## 2021-08-14 LAB — COMPREHENSIVE METABOLIC PANEL
ALT: 44 U/L (ref 0–44)
AST: 30 U/L (ref 15–41)
Albumin: 4.3 g/dL (ref 3.5–5.0)
Alkaline Phosphatase: 145 U/L — ABNORMAL HIGH (ref 38–126)
Anion gap: 11 (ref 5–15)
BUN: 13 mg/dL (ref 8–23)
CO2: 24 mmol/L (ref 22–32)
Calcium: 8.8 mg/dL — ABNORMAL LOW (ref 8.9–10.3)
Chloride: 100 mmol/L (ref 98–111)
Creatinine, Ser: 1.2 mg/dL (ref 0.61–1.24)
GFR, Estimated: >60 mL/min (ref >60)
Glucose, Bld: 126 mg/dL — ABNORMAL HIGH (ref 70–99)
Potassium: 3.4 mmol/L — ABNORMAL LOW (ref 3.5–5.1)
Sodium: 135 mmol/L (ref 135–145)
Total Bilirubin: 0.4 mg/dL (ref 0.3–1.2)
Total Protein: 6.7 g/dL (ref 6.5–8.1)

## 2021-08-14 LAB — URINALYSIS, ROUTINE W REFLEX MICROSCOPIC
Bilirubin Urine: NEGATIVE
Glucose, UA: 100 mg/dL — AB
Hgb urine dipstick: NEGATIVE
Ketones, ur: NEGATIVE mg/dL
Leukocytes,Ua: NEGATIVE
Nitrite: NEGATIVE
Protein, ur: NEGATIVE mg/dL
Specific Gravity, Urine: 1.019 (ref 1.005–1.030)
pH: 5 (ref 5.0–8.0)

## 2021-08-14 LAB — RESP PANEL BY RT-PCR (FLU A&B, COVID) ARPGX2
Influenza A by PCR: NEGATIVE
Influenza B by PCR: NEGATIVE
SARS Coronavirus 2 by RT PCR: NEGATIVE

## 2021-08-14 LAB — PROTIME-INR
INR: 1 (ref 0.8–1.2)
Prothrombin Time: 13.4 seconds (ref 11.4–15.2)

## 2021-08-14 LAB — LACTIC ACID, PLASMA
Lactic Acid, Venous: 2.3 mmol/L (ref 0.5–1.9)
Lactic Acid, Venous: 0.8 mmol/L (ref 0.5–1.9)

## 2021-08-14 MED ORDER — HYDROMORPHONE HCL 1 MG/ML IJ SOLN
INTRAMUSCULAR | Status: AC
  Filled 2021-08-14: qty 1

## 2021-08-14 MED ORDER — ACETAMINOPHEN 325 MG PO TABS
650.0000 mg | ORAL_TABLET | Freq: Once | ORAL | Status: AC
  Administered 2021-08-14: 650 mg via ORAL

## 2021-08-14 MED ORDER — ONDANSETRON HCL 4 MG/2ML IJ SOLN
INTRAMUSCULAR | Status: AC
  Filled 2021-08-14: qty 2

## 2021-08-14 MED ORDER — ACETAMINOPHEN 325 MG PO TABS
ORAL_TABLET | ORAL | Status: AC
  Filled 2021-08-14: qty 2

## 2021-08-14 NOTE — Discharge Instructions (Signed)
Seen today for evaluation of your fever.  We did not find a clear reasoning for why you are having fevers, however your work-up was very reassuring for emergent findings.  I have called and discussed your work-up and symptoms with on-call oncology who recommended that you see your oncologist tomorrow in the office to follow-up on your blood and urine cultures that were taken today and determine further management.  Please call them in the morning.  In the interim please take Tylenol as needed for fevers.  Return if development of any new or worsening symptoms.

## 2021-08-14 NOTE — ED Provider Notes (Signed)
[facility] DEPT Provider Note   CSN: [id number] Arrival date & time: 08/14/21  1717     History Chief Complaint  Patient presents with   Dysuria   Fever    David Jarvis is a 70 y.o. male.  Patient with pancreatic cancer presents today with chief complaint of fever.  States that he was asymptomatic until 2pm today when he started to feel fatigued, took a nap until 4pm and awoke and checked his temperature and saw that it was elevated at 101.  He attempted to call his oncologist who did not get back to him which prompted his ER visit.  He is actively doing chemotherapy with his last infusion being 1 week ago.  His next infusion is December 19 with plans for Whipple procedure after he finishes current chemotherapy regimen.  Dr. Learta Codding is his oncologist.  He denies any other symptoms, was treated for a UTI about 6 weeks ago and states he may have some mild burning with urination but much less significant in than when he had his UTI previously.  No cough, congestion, rhinorrhea, shortness of breath, abdominal pain, nausea, vomiting, diarrhea, dysuria, hematuria.  He states that he is otherwise healthy with no other comorbid factors aside from his cancer.   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   right temple (MOHS)   BPV (benign positional vertigo)    Colon polyps    GERD (gastroesophageal reflux disease)    Glucose intolerance (impaired glucose  tolerance)    History of nuclear stress test    ETT-Myoview 2/18: EF 57, no ischemia, low risk   Hyperlipidemia    Hypertension    Melanoma ([place]) 07/22/2008   RIGHT TEMPLE END STAGE LICHENORO REGRESSION MELANODERMA TX MOHS   Right knee pain    SCC (squamous cell carcinoma) 02/24/2013   left inner forearm (Cx35FU)   Vitreous floaters of both eyes     Patient Active Problem List   Diagnosis Date Noted   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   Genetic testing 07/02/2021   Primary cancer of head of pancreas ([place]) 05/25/2021   Epigastric abdominal pain 10/06/2020   Transaminitis 10/06/2020   Elevated lipase 10/06/2020   Essential hypertension 10/06/2020   Common bile duct dilatation 10/06/2020   Hyperlipidemia 10/06/2020   BPH (benign prostatic hyperplasia) 10/06/2020   Abdominal pain 10/05/2020   Fatigue 10/22/2016   Chest discomfort 10/22/2016    Past Surgical History:  Procedure Laterality Date   ARTHROSCOPIC REPAIR ACL Right    BILIARY BRUSHING  10/06/2020   Procedure: BILIARY BRUSHING;  Surgeon: Ronnette Juniper, MD;  Location: [facility];  Service: Gastroenterology;;   BILIARY STENT PLACEMENT N/A 05/23/2021   Procedure: BILIARY STENT PLACEMENT;  Surgeon: Ronnette Juniper, MD;  Location: [facility];  Service: Gastroenterology;  Laterality: N/A;   cardiac stress test  08/2008   COLONOSCOPY W/ POLYPECTOMY  2006   ENDOSCOPIC RETROGRADE CHOLANGIOPANCREATOGRAPHY (ERCP) WITH PROPOFOL N/A 05/23/2021   Procedure: ENDOSCOPIC RETROGRADE CHOLANGIOPANCREATOGRAPHY (ERCP) WITH PROPOFOL;  Surgeon: Ronnette Juniper, MD;  Location: [facility];  Service: Gastroenterology;  Laterality: N/A;   ERCP N/A 10/06/2020   Procedure: ENDOSCOPIC RETROGRADE CHOLANGIOPANCREATOGRAPHY (ERCP);  Surgeon: Ronnette Juniper, MD;  Location: [facility];  Service: Gastroenterology;  Laterality: N/A;   EUS N/A 05/23/2021   Procedure: UPPER ENDOSCOPIC ULTRASOUND (EUS) LINEAR;  Surgeon: Arta Silence, MD;  Location: [facility];  Service:  Endoscopy;  Laterality: N/A;   FINE NEEDLE ASPIRATION N/A 05/23/2021   Procedure: FINE NEEDLE ASPIRATION (FNA) LINEAR;  Surgeon: Arta Silence, MD;  Location: [facility];  Service: Endoscopy;  Laterality: N/A;   I & D EXTREMITY Left 02/03/2020   Procedure: IRRIGATION AND DEBRIDEMENT EXTREMITY;  Surgeon: Dayna Barker, MD;  Location: [facility];  Service: Plastics;  Laterality: Left;   IR IMAGING GUIDED PORT INSERTION  06/11/2021   laser surgery of the eye     SPHINCTEROTOMY  10/06/2020   Procedure: SPHINCTEROTOMY;  Surgeon: Ronnette Juniper, MD;  Location: [facility];  Service: Gastroenterology;;  balloon sweep       Family History  Problem Relation Age of Onset   CAD Mother    Diabetes Mother    Heart attack Mother 34   CVA Father    CVA Sister    CAD Brother    Diabetes Brother    Heart attack Brother 53   Cancer Paternal Aunt        unknown type dx. 7s   Breast cancer Cousin        paternal cousin, dx. 76s   Heart failure Neg Hx     Social History   Tobacco Use   Smoking status: Never   Smokeless tobacco: Never  Vaping Use   Vaping Use: Never used  Substance Use Topics   Alcohol use: Yes    Comment: occasionally   Drug use: No    Home Medications Prior to Admission medications   Medication Sig Start Date End Date Taking? Authorizing Provider  amLODipine (NORVASC) 5 MG tablet Take 1 tablet (5 mg total) by mouth daily. Patient not taking: Reported on 05/21/2021 10/06/20   Eugenie Filler, MD  aspirin EC 81 MG tablet Take 81 mg by mouth daily.    [provider]  atorvastatin (LIPITOR) 10 MG tablet Take 10 mg by mouth daily. Patient not taking: Reported on 06/19/2021    [provider]  atorvastatin (LIPITOR) 10 MG tablet Take 1 tablet by mouth daily Patient not taking: Reported on 08/06/2021 05/30/21     dexamethasone (DECADRON) 4 MG tablet Take 1 tab twice daily for 3 days beginning day pump is removed 07/02/21   Owens Shark, NP   diphenoxylate-atropine (LOMOTIL) 2.5-0.025 MG tablet Take 1 tablet by mouth 4 (four) times daily as needed for diarrhea or loose stools. 06/27/21   Owens Shark, NP  levothyroxine (SYNTHROID) 25 MCG tablet Take 25 mcg by mouth every morning. 03/30/21   [provider]  levothyroxine (SYNTHROID) 25 MCG tablet Take 1 tablet by mouth daily before breakfast 06/06/21     lidocaine-prilocaine (EMLA) cream Apply 1 application topically as needed. 06/06/21   Ladell Pier, MD  LORazepam (ATIVAN) 0.5 MG tablet Place 1 tablet (0.5 mg total) under the tongue every 8 (eight) hours as needed (for nausea). 06/26/21   Owens Shark, NP  magnesium oxide (MAG-OX) 400 (240 Mg) MG tablet Take 1 tablet (400 mg total) by mouth daily. 07/02/21   Owens Shark, NP  ondansetron (ZOFRAN) 8 MG tablet Take 1 tablet (  8 mg total) by mouth 2 (two) times daily. Patient not taking: Reported on 06/19/2021 06/06/21   Ladell Pier, MD  pantoprazole (PROTONIX) 40 MG tablet Take 40 mg by mouth daily.    [provider]  pantoprazole (PROTONIX) 40 MG tablet TAKE ONE TABLET BY MOUTH EVERY DAY 90 days 07/31/21     potassium chloride SA (KLOR-CON M) 20 MEQ tablet Take 1 tab twice a day for 3 days, then 1 tab daily 07/02/21   Owens Shark, NP  prochlorperazine (COMPAZINE) 10 MG tablet Take 1 tablet (10 mg total) by mouth every 6 (six) hours as needed for nausea or vomiting. Patient not taking: Reported on 06/19/2021 06/06/21   Ladell Pier, MD  sucralfate (CARAFATE) 1 g tablet Take 1 tablet (1 g total) by mouth 4 (four) times daily -  with meals and at bedtime for 7 days. 08/01/21 08/08/21  Jeanell Sparrow, DO  tamsulosin (FLOMAX) 0.4 MG CAPS capsule Take 0.4 mg by mouth daily. 11/30/19   [provider]  tamsulosin (FLOMAX) 0.4 MG CAPS capsule at bedtime 11/30/19   [provider]  tamsulosin (FLOMAX) 0.4 MG CAPS capsule Take 1 capsule by mouth once daily 03/08/21      valsartan-hydrochlorothiazide (DIOVAN-HCT) 80-12.5 MG tablet Take 0.5 tablets by mouth daily. 03/08/21   [provider]  valsartan-hydrochlorothiazide (DIOVAN-HCT) 80-12.5 MG tablet Take 1/2 tablet by mouth once daily *please half* 03/08/21       Allergies    Ace inhibitors and Lisinopril  Review of Systems   Review of Systems  Constitutional:  Positive for fever. Negative for chills.  HENT:  Negative for congestion, postnasal drip, rhinorrhea, sore throat, trouble swallowing and voice change.   Respiratory:  Negative for cough, shortness of breath, wheezing and stridor.   Cardiovascular:  Negative for chest pain.  Gastrointestinal:  Negative for abdominal distention, abdominal pain, blood in stool, diarrhea, nausea and vomiting.  Genitourinary:  Positive for dysuria. Negative for decreased urine volume, difficulty urinating, flank pain, hematuria and urgency.  Allergic/Immunologic: Positive for immunocompromised state.  Neurological:  Negative for headaches.  Psychiatric/Behavioral:  Negative for confusion and decreased concentration.   All other systems reviewed and are negative.  Physical Exam Updated Vital Signs BP 129/84    Pulse 86    Temp (!) 103.2 F (39.6 C) (Oral)    Resp 18    Ht $R'5\' 6"'ui$  (1.676 m)    Wt 71.7 kg    SpO2 99%    BMI 25.50 kg/m   Physical Exam Vitals and nursing note reviewed.  Constitutional:      Appearance: Normal appearance. He is normal weight.     Comments: Patient well-appearing resting comfortably in bed in no acute distress  HENT:     Head: Normocephalic and atraumatic.     Nose: Nose normal.     Mouth/Throat:     Mouth: Mucous membranes are moist.  Eyes:     Conjunctiva/sclera: Conjunctivae normal.  Cardiovascular:     Rate and Rhythm: Normal rate and regular rhythm.     Heart sounds: Normal heart sounds.  Pulmonary:     Effort: Pulmonary effort is normal. No respiratory distress.     Breath sounds: Normal breath sounds.  Abdominal:      General: Abdomen is flat. Bowel sounds are normal. There is no distension.     Palpations: Abdomen is soft.     Tenderness: There is no abdominal tenderness. There is no right CVA tenderness  or left CVA tenderness.  Musculoskeletal:        General: Normal range of motion.  Skin:    General: Skin is warm and dry.  Neurological:     General: No focal deficit present.     Mental Status: He is alert.  Psychiatric:        Mood and Affect: Mood normal.        Behavior: Behavior normal.    ED Results / Procedures / Treatments   Labs (all labs ordered are listed, but only abnormal results are displayed) Labs Reviewed  COMPREHENSIVE METABOLIC PANEL - Abnormal; Notable for the following components:      Result Value   Potassium 3.4 (*)    Glucose, Bld 126 (*)    Calcium 8.8 (*)    Alkaline Phosphatase 145 (*)    All other components within normal limits  LACTIC ACID, PLASMA - Abnormal; Notable for the following components:   Lactic Acid, Venous 2.3 (*)    All other components within normal limits  CBC WITH DIFFERENTIAL/PLATELET - Abnormal; Notable for the following components:   RBC 3.56 (*)    Hemoglobin 10.7 (*)    HCT 32.1 (*)    RDW 16.6 (*)    Lymphs Abs 0.5 (*)    Abs Immature Granulocytes 0.09 (*)    All other components within normal limits  URINALYSIS, ROUTINE W REFLEX MICROSCOPIC - Abnormal; Notable for the following components:   Glucose, UA 100 (*)    All other components within normal limits  RESP PANEL BY RT-PCR (FLU A&B, COVID) [id number]  CULTURE, BLOOD (ROUTINE X 2)  CULTURE, BLOOD (ROUTINE X 2)  URINE CULTURE  PROTIME-INR  LACTIC ACID, PLASMA    EKG None  Radiology DG Chest 2 View  Result Date: 08/14/2021 CLINICAL DATA:  Suspected sepsis EXAM: CHEST - 2 VIEW COMPARISON:  04/15/2017, CT chest 08/01/2021 FINDINGS: Right-sided central venous port tip over the cavoatrial region. No focal opacity or pleural effusion. Normal cardiomediastinal silhouette. No  pneumothorax IMPRESSION: No active cardiopulmonary disease. Electronically Signed   By: Donavan Foil M.D.   On: 08/14/2021 17:53    Procedures Procedures   Medications Ordered in ED Medications  acetaminophen (TYLENOL) tablet 650 mg (650 mg Oral Given 08/14/21 1729)    ED Course  I have reviewed the triage vital signs and the nursing notes.  Pertinent labs & imaging results that were available during my care of the patient were reviewed by me and considered in my medical decision making (see chart for details).    MDM Rules/Calculators/A&P                         Patient with pancreatic cancer actively being treated with chemotherapy presents today with fever that started approximately 2 hours prior to arrival.  Endorsed some mild dysuria, however no other symptoms.  Patient extremely well-appearing in the setting of pancreatic cancer, no other comorbid factors.  Labs reveal no neutropenia, white count of 6.  Anemia at 10.7, however stable over the past month.  No electrolyte abnormalities, small increase of alk phos at 145 doubtful to be etiology of patient's fever.  Lactic elevated at 2.3.  UA without signs of infection.  Urine and blood cultures pending at this time.  Chest x-ray without evidence of acute cardiopulmonary disease.  COVID and flu negative.  In the setting of extremely well-appearing patient with normal vital signs save for a fever of 103 who is  not neutropenic, consulted oncology on-call for further management. Discussed with Dr. Alvy Bimler admission with antibiotics versus discharge and close follow-up, she is advised no empiric antibiotics and for patient to see his oncologist in the office tomorrow who can follow-up on his culture results.  After reassessment, patient still feeling well, lactic down to 0.8. Feel he is stable for discharge at this time. Discussed this with patient who is amenable with plan.  Educated on red flag symptoms of prompt immediate return.  Discharged  in stable condition.  This is a shared visit with supervising physician Dr. Sherry Ruffing who has independently evaluated patient & provided guidance in evaluation/management/disposition, in agreement with care    Final Clinical Impression(s) / ED Diagnoses Final diagnoses:  Pyrexia of unknown origin    Rx / DC Orders ED Discharge Orders     None     An After Visit Summary was printed and given to the patient.    Nestor Lewandowsky 08/14/21 2029    Tegeler, Gwenyth Allegra, MD 08/15/21 [phone]

## 2021-08-14 NOTE — ED Triage Notes (Addendum)
Pt states that he has had a fever 101.9 at home states having some dysuria no cough  or cold  s/ s . Last chemo was 1 week ago

## 2021-08-15 ENCOUNTER — Encounter: Payer: Self-pay | Admitting: *Deleted

## 2021-08-15 LAB — URINE CULTURE: Culture: NO GROWTH

## 2021-08-15 NOTE — Progress Notes (Signed)
S/W pt this am after he presented to ER last evening with fever of 103F. No other symptoms other than mild urinary discomfort. U/A unremarkable, blood and urine cultures pending. Feels well this am.  Instructed him to take temp before taking Tylenol and to call office if he has any fever or other symptoms. Will continue to follow cultures

## 2021-08-17 ENCOUNTER — Other Ambulatory Visit: Payer: Self-pay | Admitting: Nurse Practitioner

## 2021-08-17 ENCOUNTER — Inpatient Hospital Stay: Payer: Medicare HMO | Admitting: Oncology

## 2021-08-17 ENCOUNTER — Other Ambulatory Visit: Payer: Self-pay | Admitting: *Deleted

## 2021-08-17 ENCOUNTER — Other Ambulatory Visit (HOSPITAL_BASED_OUTPATIENT_CLINIC_OR_DEPARTMENT_OTHER): Payer: Self-pay

## 2021-08-17 ENCOUNTER — Other Ambulatory Visit: Payer: Self-pay

## 2021-08-17 ENCOUNTER — Inpatient Hospital Stay: Payer: Medicare HMO

## 2021-08-17 ENCOUNTER — Encounter: Payer: Self-pay | Admitting: *Deleted

## 2021-08-17 VITALS — BP 125/86 | HR 75 | Temp 98.2°F | Resp 18 | Ht 66.0 in | Wt 149.4 lb

## 2021-08-17 DIAGNOSIS — Z5189 Encounter for other specified aftercare: Secondary | ICD-10-CM | POA: Diagnosis not present

## 2021-08-17 DIAGNOSIS — Z5111 Encounter for antineoplastic chemotherapy: Secondary | ICD-10-CM | POA: Diagnosis not present

## 2021-08-17 DIAGNOSIS — I10 Essential (primary) hypertension: Secondary | ICD-10-CM | POA: Diagnosis not present

## 2021-08-17 DIAGNOSIS — C250 Malignant neoplasm of head of pancreas: Secondary | ICD-10-CM

## 2021-08-17 DIAGNOSIS — C772 Secondary and unspecified malignant neoplasm of intra-abdominal lymph nodes: Secondary | ICD-10-CM | POA: Diagnosis not present

## 2021-08-17 DIAGNOSIS — Z95828 Presence of other vascular implants and grafts: Secondary | ICD-10-CM

## 2021-08-17 DIAGNOSIS — E785 Hyperlipidemia, unspecified: Secondary | ICD-10-CM | POA: Diagnosis not present

## 2021-08-17 DIAGNOSIS — K219 Gastro-esophageal reflux disease without esophagitis: Secondary | ICD-10-CM | POA: Diagnosis not present

## 2021-08-17 DIAGNOSIS — Z79899 Other long term (current) drug therapy: Secondary | ICD-10-CM | POA: Diagnosis not present

## 2021-08-17 DIAGNOSIS — Z85828 Personal history of other malignant neoplasm of skin: Secondary | ICD-10-CM | POA: Diagnosis not present

## 2021-08-17 LAB — CBC WITH DIFFERENTIAL (CANCER CENTER ONLY)
Abs Immature Granulocytes: 0.14 10*3/uL — ABNORMAL HIGH (ref 0.00–0.07)
Basophils Absolute: 0 10*3/uL (ref 0.0–0.1)
Basophils Relative: 0 %
Eosinophils Absolute: 0 10*3/uL (ref 0.0–0.5)
Eosinophils Relative: 0 %
HCT: 33 % — ABNORMAL LOW (ref 39.0–52.0)
Hemoglobin: 10.8 g/dL — ABNORMAL LOW (ref 13.0–17.0)
Immature Granulocytes: 2 %
Lymphocytes Relative: 19 %
Lymphs Abs: 1.4 10*3/uL (ref 0.7–4.0)
MCH: 29.7 pg (ref 26.0–34.0)
MCHC: 32.7 g/dL (ref 30.0–36.0)
MCV: 90.7 fL (ref 80.0–100.0)
Monocytes Absolute: 1 10*3/uL (ref 0.1–1.0)
Monocytes Relative: 13 %
Neutro Abs: 4.7 10*3/uL (ref 1.7–7.7)
Neutrophils Relative %: 66 %
Platelet Count: 125 10*3/uL — ABNORMAL LOW (ref 150–400)
RBC: 3.64 MIL/uL — ABNORMAL LOW (ref 4.22–5.81)
RDW: 17 % — ABNORMAL HIGH (ref 11.5–15.5)
WBC Count: 7.2 10*3/uL (ref 4.0–10.5)
nRBC: 0 % (ref 0.0–0.2)

## 2021-08-17 LAB — CMP (CANCER CENTER ONLY)
ALT: 41 U/L (ref 0–44)
AST: 28 U/L (ref 15–41)
Albumin: 3.9 g/dL (ref 3.5–5.0)
Alkaline Phosphatase: 123 U/L (ref 38–126)
Anion gap: 10 (ref 5–15)
BUN: 12 mg/dL (ref 8–23)
CO2: 24 mmol/L (ref 22–32)
Calcium: 8.9 mg/dL (ref 8.9–10.3)
Chloride: 103 mmol/L (ref 98–111)
Creatinine: 0.98 mg/dL (ref 0.61–1.24)
GFR, Estimated: >60 mL/min (ref >60)
Glucose, Bld: 207 mg/dL — ABNORMAL HIGH (ref 70–99)
Potassium: 3.6 mmol/L (ref 3.5–5.1)
Sodium: 137 mmol/L (ref 135–145)
Total Bilirubin: 0.4 mg/dL (ref 0.3–1.2)
Total Protein: 6.4 g/dL — ABNORMAL LOW (ref 6.5–8.1)

## 2021-08-17 LAB — URINALYSIS, COMPLETE (UACMP) WITH MICROSCOPIC
Bilirubin Urine: NEGATIVE
Glucose, UA: 250 mg/dL — AB
Hgb urine dipstick: NEGATIVE
Nitrite: NEGATIVE
Protein, ur: 30 mg/dL — AB
Specific Gravity, Urine: 1.04 — ABNORMAL HIGH (ref 1.005–1.030)
pH: 5.5 (ref 5.0–8.0)

## 2021-08-17 MED ORDER — CIPROFLOXACIN HCL 500 MG PO TABS
500.0000 mg | ORAL_TABLET | Freq: Two times a day (BID) | ORAL | 0 refills | Status: AC
  Filled 2021-08-17: qty 10, 5d supply, fill #0

## 2021-08-17 MED ORDER — HEPARIN SOD (PORK) LOCK FLUSH 100 UNIT/ML IV SOLN
500.0000 [IU] | Freq: Once | INTRAVENOUS | Status: AC
  Administered 2021-08-17: 500 [IU] via INTRAVENOUS

## 2021-08-17 MED ORDER — SODIUM CHLORIDE 0.9% FLUSH
10.0000 mL | Freq: Once | INTRAVENOUS | Status: AC
  Administered 2021-08-17: 10 mL via INTRAVENOUS

## 2021-08-17 NOTE — Progress Notes (Signed)
a 

## 2021-08-17 NOTE — Progress Notes (Signed)
Spoke with Mr Tindel and he continues to have fever that responds to Tylenol. Spikes are sporatic morning and evening with last spike last night at 2100 to 101.3, he also stated that he woke last night to sweats. Reviewed blood and urine cultures from ED visit on 12/13 and they report no growth. Reviewed with Dr Benay Spice and Ned Card NP and called patient to report to clinic today for labs and MD visit. Pt called and scheduled

## 2021-08-17 NOTE — Progress Notes (Signed)
[place] OFFICE PROGRESS NOTE   Diagnosis: Pancreas cancer  INTERVAL HISTORY:   David Jarvis returns for an unscheduled visit.  He completed another cycle FOLFIRINOX on 08/06/2021.  No nausea/vomiting.  He reports mild diarrhea following chemotherapy, relieved with Lomotil.  He has cold sensitivity, but no peripheral numbness.  No pain.  He developed a fever of 101 degrees on the afternoon of 08/14/2021.  He was seen in the emergency room.  His fever was measured at 103.2 in the emergency room.  He reported mild dysuria.  He appeared well.  A urinalysis did not reveal evidence of infection.  A chest x-ray revealed no acute disease.  He was discharged to home. He reports recurrent fever in the evening each of the last 2 days.  He had malaise and anorexia for the past several days.  He feels much better today.  He has 1 anterior lip ulcer.  No dyspnea or cough.  No sore throat.  No pain or tenderness at the Port-A-Cath.  Objective:  Vital signs in last 24 hours:  Blood pressure 125/86, pulse 75, temperature 98.2 F (36.8 C), temperature source Oral, resp. rate 18, height 5\' 6"  (1.676 m), weight 149 lb 6 oz (67.8 kg), SpO2 99 %.    HEENT: No thrush.  Pharynx without erythema or exudate.  2 to 3 mm healing ulcer at the lower inner lip Resp: Lungs clear bilaterally Cardio: Regular rate and rhythm GI: Nontender, no hepatosplenomegaly Vascular: No leg edema    Portacath/PICC-without erythema or tenderness.  Lab Results:  Lab Results  Component Value Date   WBC 7.2 08/17/2021   HGB 10.8 (L) 08/17/2021   HCT 33.0 (L) 08/17/2021   MCV 90.7 08/17/2021   PLT 125 (L) 08/17/2021   NEUTROABS 4.7 08/17/2021    CMP  Lab Results  Component Value Date   NA 137 08/17/2021   K 3.6 08/17/2021   CL 103 08/17/2021   CO2 24 08/17/2021   GLUCOSE 207 (H) 08/17/2021   BUN 12 08/17/2021   CREATININE 0.98 08/17/2021   CALCIUM 8.9 08/17/2021   PROT 6.4 (L) 08/17/2021   ALBUMIN  3.9 08/17/2021   AST 28 08/17/2021   ALT 41 08/17/2021   ALKPHOS 123 08/17/2021   BILITOT 0.4 08/17/2021   GFRNONAA >60 08/17/2021    Lab Results  Component Value Date   CAN199 320 (H) 06/19/2021    Lab Results  Component Value Date   INR 1.0 08/14/2021   LABPROT 13.4 08/14/2021    Imaging:  DG Chest 2 View  Result Date: 08/14/2021 CLINICAL DATA:  Suspected sepsis EXAM: CHEST - 2 VIEW COMPARISON:  04/15/2017, CT chest 08/01/2021 FINDINGS: Right-sided central venous port tip over the cavoatrial region. No focal opacity or pleural effusion. Normal cardiomediastinal silhouette. No pneumothorax IMPRESSION: No active cardiopulmonary disease. Electronically Signed   By: Donavan Foil M.D.   On: 08/14/2021 17:53    Medications: I have reviewed the patient's current medications.   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, stent placed EUS 05/23/2021-25 x 15 mm pancreas head mass with irregular borders, no lymphadenopathy, no vascular involvement, T2 N0 by ultrasound CT chest 06/01/2021-negative for metastatic disease CT abdomen pancreas protocol 06/01/2021-3.2 x 2.3 cm hypoenhancing pancreas head mass inseparable from the third portion of the duodenum, 1 cm left retroperitoneal  lymph node, no vascular involvement Cycle 1 FOLFIRINOX 06/19/2021 Cycle 2 FOLFIRINOX 07/02/2021, irinotecan and 5-FU dose reduced, prophylactic dexamethasone added beginning day of pump discontinuation Cycle 3 FOLFIRINOX 07/23/2021, Udenyca CT abdomen/pelvis 08/01/2021-done in the emergency department to evaluate epigastric pain-pancreas head mass decreased in size.  Diffuse gaseous distention of large and small bowel.  No bowel obstruction or focal inflammation. Cycle 4 FOLFIRINOX 08/06/2021,  Udenyca Obstructive jaundice secondary #1 Hypertension Admission in February 2022 with elevated liver enzymes MRI/MRCP-intra and extrahepatic biliary duct dilation, tapering smoothly to the ampulla, no mass or filling defect ERCP 10/06/2020-dilated main bile duct, biliary tree was swept and nothing was found, cytology from bile duct brushing-no malignant cells identified 5.  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  GERD 7.  Squamous cell carcinoma left forearm 2014 8.  Hyperlipidemia 9.  CT chest 08/01/2021, done in the emergency department to evaluate pleuritic chest pain-no PE.  Advanced three-vessel coronary vascular calcification.      Disposition: David Jarvis is now at day 12 following cycle 4 FOLFIRINOX.  He has developed a fever each of the last 3 evenings.  No associated symptoms.  No apparent source for infection.  Cultures from the emergency room 08/14/2021 remain negative.  I discussed the differential diagnosis for the fever with David. [place].  He understands a fever could be related to an unrecognized infection such as a Port-A-Cath infection or an infected bile duct stent.  There is no clinical evidence of a Port-A-Cath infection of the liver enzymes are normal.  He could have "tumor "fever, but he has not experienced a fever in the past and an abdominal CT 08/01/2021 revealed a decrease in the pancreas mass.  We obtained repeat peripheral blood cultures and a culture from the Port-A-Cath today.  We will also obtain a urinalysis and urine culture.  He will begin a persistent course of ciprofloxacin.  He will call for a persistent fever, dyspnea, increased weakness, or new symptoms.  He will return as scheduled on 08/20/2021.  Betsy Coder, MD  08/17/2021  12:56 PM

## 2021-08-17 NOTE — Patient Instructions (Signed)

## 2021-08-18 ENCOUNTER — Other Ambulatory Visit: Payer: Self-pay | Admitting: Hematology and Oncology

## 2021-08-18 MED ORDER — LEVOFLOXACIN 500 MG PO TABS
500.0000 mg | ORAL_TABLET | Freq: Every day | ORAL | 0 refills | Status: DC
Start: 2021-08-18 — End: 2021-09-17

## 2021-08-18 MED ORDER — LEVOFLOXACIN 500 MG PO TABS
500.0000 mg | ORAL_TABLET | Freq: Every day | ORAL | 0 refills | Status: DC
  Filled 2021-08-18: qty 14, 14d supply, fill #0

## 2021-08-19 ENCOUNTER — Other Ambulatory Visit: Payer: Self-pay | Admitting: Oncology

## 2021-08-19 DIAGNOSIS — C250 Malignant neoplasm of head of pancreas: Secondary | ICD-10-CM | POA: Diagnosis not present

## 2021-08-19 LAB — CULTURE, BLOOD (ROUTINE X 2)
Culture: NO GROWTH
Culture: NO GROWTH
Special Requests: ADEQUATE
Special Requests: ADEQUATE

## 2021-08-20 ENCOUNTER — Inpatient Hospital Stay: Payer: Medicare HMO

## 2021-08-20 ENCOUNTER — Other Ambulatory Visit (HOSPITAL_BASED_OUTPATIENT_CLINIC_OR_DEPARTMENT_OTHER): Payer: Self-pay

## 2021-08-20 ENCOUNTER — Other Ambulatory Visit: Payer: Self-pay

## 2021-08-20 ENCOUNTER — Inpatient Hospital Stay: Payer: Medicare HMO | Admitting: Oncology

## 2021-08-20 VITALS — BP 132/85 | HR 77 | Temp 97.8°F | Resp 20 | Ht 66.0 in | Wt 155.6 lb

## 2021-08-20 VITALS — BP 160/85 | HR 64

## 2021-08-20 DIAGNOSIS — C250 Malignant neoplasm of head of pancreas: Secondary | ICD-10-CM

## 2021-08-20 DIAGNOSIS — Z5189 Encounter for other specified aftercare: Secondary | ICD-10-CM | POA: Diagnosis not present

## 2021-08-20 DIAGNOSIS — Z79899 Other long term (current) drug therapy: Secondary | ICD-10-CM | POA: Diagnosis not present

## 2021-08-20 DIAGNOSIS — K219 Gastro-esophageal reflux disease without esophagitis: Secondary | ICD-10-CM | POA: Diagnosis not present

## 2021-08-20 DIAGNOSIS — Z5111 Encounter for antineoplastic chemotherapy: Secondary | ICD-10-CM | POA: Diagnosis not present

## 2021-08-20 DIAGNOSIS — I10 Essential (primary) hypertension: Secondary | ICD-10-CM | POA: Diagnosis not present

## 2021-08-20 DIAGNOSIS — C772 Secondary and unspecified malignant neoplasm of intra-abdominal lymph nodes: Secondary | ICD-10-CM | POA: Diagnosis not present

## 2021-08-20 DIAGNOSIS — E785 Hyperlipidemia, unspecified: Secondary | ICD-10-CM | POA: Diagnosis not present

## 2021-08-20 DIAGNOSIS — Z85828 Personal history of other malignant neoplasm of skin: Secondary | ICD-10-CM | POA: Diagnosis not present

## 2021-08-20 LAB — CBC WITH DIFFERENTIAL (CANCER CENTER ONLY)
Abs Immature Granulocytes: 0.43 10*3/uL — ABNORMAL HIGH (ref 0.00–0.07)
Basophils Absolute: 0.1 10*3/uL (ref 0.0–0.1)
Basophils Relative: 1 %
Eosinophils Absolute: 0 10*3/uL (ref 0.0–0.5)
Eosinophils Relative: 0 %
HCT: 30.5 % — ABNORMAL LOW (ref 39.0–52.0)
Hemoglobin: 10 g/dL — ABNORMAL LOW (ref 13.0–17.0)
Immature Granulocytes: 4 %
Lymphocytes Relative: 19 %
Lymphs Abs: 2.2 10*3/uL (ref 0.7–4.0)
MCH: 30.6 pg (ref 26.0–34.0)
MCHC: 32.8 g/dL (ref 30.0–36.0)
MCV: 93.3 fL (ref 80.0–100.0)
Monocytes Absolute: 0.7 10*3/uL (ref 0.1–1.0)
Monocytes Relative: 6 %
Neutro Abs: 8.1 10*3/uL — ABNORMAL HIGH (ref 1.7–7.7)
Neutrophils Relative %: 70 %
Platelet Count: 148 10*3/uL — ABNORMAL LOW (ref 150–400)
RBC: 3.27 MIL/uL — ABNORMAL LOW (ref 4.22–5.81)
RDW: 17.2 % — ABNORMAL HIGH (ref 11.5–15.5)
WBC Count: 11.5 10*3/uL — ABNORMAL HIGH (ref 4.0–10.5)
nRBC: 0.3 % — ABNORMAL HIGH (ref 0.0–0.2)

## 2021-08-20 LAB — CMP (CANCER CENTER ONLY)
ALT: 31 U/L (ref 0–44)
AST: 26 U/L (ref 15–41)
Albumin: 3.8 g/dL (ref 3.5–5.0)
Alkaline Phosphatase: 148 U/L — ABNORMAL HIGH (ref 38–126)
Anion gap: 9 (ref 5–15)
BUN: 12 mg/dL (ref 8–23)
CO2: 25 mmol/L (ref 22–32)
Calcium: 8.9 mg/dL (ref 8.9–10.3)
Chloride: 105 mmol/L (ref 98–111)
Creatinine: 0.93 mg/dL (ref 0.61–1.24)
GFR, Estimated: >60 mL/min (ref >60)
Glucose, Bld: 132 mg/dL — ABNORMAL HIGH (ref 70–99)
Potassium: 3.9 mmol/L (ref 3.5–5.1)
Sodium: 139 mmol/L (ref 135–145)
Total Bilirubin: 0.3 mg/dL (ref 0.3–1.2)
Total Protein: 6 g/dL — ABNORMAL LOW (ref 6.5–8.1)

## 2021-08-20 LAB — MAGNESIUM: Magnesium: 1.7 mg/dL (ref 1.7–2.4)

## 2021-08-20 MED ORDER — SODIUM CHLORIDE 0.9 % IV SOLN
10.0000 mg | Freq: Once | INTRAVENOUS | Status: AC
  Administered 2021-08-20: 11:00:00 10 mg via INTRAVENOUS
  Filled 2021-08-20: qty 1

## 2021-08-20 MED ORDER — DEXTROSE 5 % IV SOLN: Freq: Once | INTRAVENOUS | Status: AC

## 2021-08-20 MED ORDER — ATROPINE SULFATE 1 MG/ML IV SOLN
0.5000 mg | Freq: Once | INTRAVENOUS | Status: AC | PRN
  Administered 2021-08-20: 14:00:00 0.5 mg via INTRAVENOUS
  Filled 2021-08-20: qty 1

## 2021-08-20 MED ORDER — SODIUM CHLORIDE 0.9 % IV SOLN
120.0000 mg/m2 | Freq: Once | INTRAVENOUS | Status: AC
  Administered 2021-08-20: 14:00:00 220 mg via INTRAVENOUS
  Filled 2021-08-20: qty 11

## 2021-08-20 MED ORDER — SODIUM CHLORIDE 0.9 % IV SOLN
150.0000 mg | Freq: Once | INTRAVENOUS | Status: AC
  Administered 2021-08-20: 11:00:00 150 mg via INTRAVENOUS
  Filled 2021-08-20: qty 5

## 2021-08-20 MED ORDER — PALONOSETRON HCL INJECTION 0.25 MG/5ML
0.2500 mg | Freq: Once | INTRAVENOUS | Status: AC
  Administered 2021-08-20: 11:00:00 0.25 mg via INTRAVENOUS
  Filled 2021-08-20: qty 5

## 2021-08-20 MED ORDER — SODIUM CHLORIDE 0.9 % IV SOLN
2000.0000 mg/m2 | INTRAVENOUS | Status: DC
  Administered 2021-08-20: 16:00:00 3650 mg via INTRAVENOUS
  Filled 2021-08-20: qty 73

## 2021-08-20 MED ORDER — SODIUM CHLORIDE 0.9 % IV SOLN
400.0000 mg/m2 | Freq: Once | INTRAVENOUS | Status: AC
  Administered 2021-08-20: 14:00:00 728 mg via INTRAVENOUS
  Filled 2021-08-20: qty 36.4

## 2021-08-20 MED ORDER — OXALIPLATIN CHEMO INJECTION 100 MG/20ML
150.0000 mg | Freq: Once | INTRAVENOUS | Status: AC
  Administered 2021-08-20: 12:00:00 150 mg via INTRAVENOUS
  Filled 2021-08-20: qty 10

## 2021-08-20 NOTE — Progress Notes (Signed)
[name] OFFICE PROGRESS NOTE   Diagnosis: Pancreas cancer  INTERVAL HISTORY:   David Jarvis returns as scheduled.  No further fever.  He began ciprofloxacin 08/17/2021.  He developed anxiety and insomnia on ciprofloxacin.  He contacted the on-call physician over the weekend and was changed to [place].  He feels well today.  No peripheral numbness.  No new complaint.  Objective:  Vital signs in last 24 hours:  Blood pressure 132/85, pulse 77, temperature 97.8 F (36.6 C), temperature source Oral, resp. rate 20, height 5\' 6"  (1.676 m), weight 155 lb 9.6 oz (70.6 kg), SpO2 100 %.    HEENT: No thrush or ulcers Resp: Lungs clear bilaterally Cardio: Regular rate and rhythm GI: No hepatosplenomegaly, no mass, nontender Vascular: No leg edema  Skin: Palms without erythema  Portacath/PICC-without erythema  Lab Results:  Lab Results  Component Value Date   WBC 11.5 (H) 08/20/2021   HGB 10.0 (L) 08/20/2021   HCT 30.5 (L) 08/20/2021   MCV 93.3 08/20/2021   PLT 148 (L) 08/20/2021   NEUTROABS 8.1 (H) 08/20/2021    CMP  Lab Results  Component Value Date   NA 137 08/17/2021   K 3.6 08/17/2021   CL 103 08/17/2021   CO2 24 08/17/2021   GLUCOSE 207 (H) 08/17/2021   BUN 12 08/17/2021   CREATININE 0.98 08/17/2021   CALCIUM 8.9 08/17/2021   PROT 6.4 (L) 08/17/2021   ALBUMIN 3.9 08/17/2021   AST 28 08/17/2021   ALT 41 08/17/2021   ALKPHOS 123 08/17/2021   BILITOT 0.4 08/17/2021   GFRNONAA >60 08/17/2021    Lab Results  Component Value Date   ALP379 320 (H) 06/19/2021      Medications: I have reviewed the patient's current medications.   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, stent placed EUS 05/23/2021-25 x 15 mm pancreas  head mass with irregular borders, no lymphadenopathy, no vascular involvement, T2 N0 by ultrasound CT chest 06/01/2021-negative for metastatic disease CT abdomen pancreas protocol 06/01/2021-3.2 x 2.3 cm hypoenhancing pancreas head mass inseparable from the third portion of the duodenum, 1 cm left retroperitoneal lymph node, no vascular involvement Cycle 1 FOLFIRINOX 06/19/2021 Cycle 2 FOLFIRINOX 07/02/2021, irinotecan and 5-FU dose reduced, prophylactic dexamethasone added beginning day of pump discontinuation Cycle 3 FOLFIRINOX 07/23/2021, Udenyca CT abdomen/pelvis 08/01/2021-done in the emergency department to evaluate epigastric pain-pancreas head mass decreased in size.  Diffuse gaseous distention of large and small bowel.  No bowel obstruction or focal inflammation. Cycle 4 FOLFIRINOX 08/06/2021, Udenyca Cycle 5 FOLFIRINOX 08/20/2021, Udenyca Obstructive jaundice secondary #1 Hypertension Admission in February 2022 with elevated liver enzymes MRI/MRCP-intra and extrahepatic biliary duct dilation, tapering smoothly to the ampulla, no mass or filling defect ERCP 10/06/2020-dilated main bile duct, biliary tree was swept and nothing was found, cytology from bile duct brushing-no malignant cells identified 5.  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  GERD 7.  Squamous cell carcinoma left forearm 2014 8.  Hyperlipidemia 9.  CT chest 08/01/2021, done in the emergency department to evaluate pleuritic chest pain-no PE.  Advanced three-vessel coronary vascular calcification. 10.  Fever of unknown origin beginning 08/14/2021-blood and urine cultures negative, no source for infection identified, placed on antibiotic prophylaxis beginning 08/17/2021      Disposition: David Jarvis appears well.  No further fever.  Cultures of the  blood and urine from last week remain negative.  No source for infection was identified when he presented with a high fever last week.  He will complete a 1 week course of  Levaquin.  The plan is to proceed with cycle 5 FOLFIRINOX today.  He will return for an office visit and chemotherapy in 2 weeks.  He will call for recurrent fever or new symptoms.  Betsy Coder, MD  08/20/2021  9:29 AM

## 2021-08-20 NOTE — Progress Notes (Signed)
Pt reports cipro antibiotic made him feel "wired" and reports inability to sleep. Pt called triage line on 08/18/21 and on call physician gave him Levaquin instead. MD Sherrill notified.   Patient seen by Dr. Benay Spice today  Vitals are within treatment parameters.  Labs reviewed by Dr. Benay Spice and are within treatment parameters.  Per physician team, patient is ready for treatment and there are NO modifications to the treatment plan.

## 2021-08-20 NOTE — Progress Notes (Signed)
Patient presents for treatment. RN assessment completed along with the following:  Labs/vitals reviewed - Yes, and within treatment parameters.   Weight within 10% of previous measurement - Yes Oncology Treatment Attestation completed for current therapy- Yes, on date 05/25/21 Informed consent completed and reflects current therapy/intent - Yes, on date 06/19/21             Provider progress note reviewed - Yes, today's provider note was reviewed. Treatment/Antibody/Supportive plan reviewed - Yes, and there are no adjustments needed for today's treatment. S&H and other orders reviewed - Yes, and there are no additional orders identified. Previous treatment date reviewed - Yes, and the appropriate amount of time has elapsed between treatments. Clinic Hand Off Received from - Doristine Section, RN    Patient to proceed with treatment.

## 2021-08-20 NOTE — Patient Instructions (Signed)
[place]   The chemotherapy medication bag should finish at 46 hours, 96 hours, or 7 days. For example, if your pump is scheduled for 46 hours and it was put on at 4:00 p.m., it should finish at 2:00 p.m. the day it is scheduled to come off regardless of your appointment time.     Estimated time to finish at 1:45 Wednesday, August 22, 2021.   If the display on your pump reads "Low Volume" and it is beeping, take the batteries out of the pump and come to the cancer center for it to be taken off.   If the pump alarms go off prior to the pump reading "Low Volume" then call [phone] and someone can assist you.  If the plunger comes out and the chemotherapy medication is leaking out, please use your home chemo spill kit to clean up the spill. Do NOT use paper towels or other household products.  If you have problems or questions regarding your pump, please call either [phone] (24 hours a day) or the cancer center Monday-Friday 8:00 a.m.- 4:30 p.m. at the clinic number and we will assist you. If you are unable to get assistance, then go to the nearest Emergency Department and ask the staff to contact the IV team for assistance.  Discharge Instructions: Thank you for choosing [facility] to provide your oncology and hematology care.   If you have a lab appointment with the [facility], please go directly to the [facility] and check in at the registration area.   Wear comfortable clothing and clothing appropriate for easy access to any Portacath or PICC line.   We strive to give you quality time with your provider. You may need to reschedule your appointment if you arrive late (15 or more minutes).  Arriving late affects you and other patients whose appointments are after yours.  Also, if you miss three or more appointments without notifying the office, you may be dismissed from the clinic at the providers discretion.      For  prescription refill requests, have your pharmacy contact our office and allow 72 hours for refills to be completed.    Today you received the following chemotherapy and/or immunotherapy agents Oxaliplatin, leucovorin, irinotecan, fluorouracil.      To help prevent nausea and vomiting after your treatment, we encourage you to take your nausea medication as directed.  BELOW ARE SYMPTOMS THAT SHOULD BE REPORTED IMMEDIATELY: *FEVER GREATER THAN 100.4 F (38 C) OR HIGHER *CHILLS OR SWEATING *NAUSEA AND VOMITING THAT IS NOT CONTROLLED WITH YOUR NAUSEA MEDICATION *UNUSUAL SHORTNESS OF BREATH *UNUSUAL BRUISING OR BLEEDING *URINARY PROBLEMS (pain or burning when urinating, or frequent urination) *BOWEL PROBLEMS (unusual diarrhea, constipation, pain near the anus) TENDERNESS IN MOUTH AND THROAT WITH OR WITHOUT PRESENCE OF ULCERS (sore throat, sores in mouth, or a toothache) UNUSUAL RASH, SWELLING OR PAIN  UNUSUAL VAGINAL DISCHARGE OR ITCHING   Items with * indicate a potential emergency and should be followed up as soon as possible or go to the Emergency Department if any problems should occur.  Please show the CHEMOTHERAPY ALERT CARD or IMMUNOTHERAPY ALERT CARD at check-in to the Emergency Department and triage nurse.  Should you have questions after your visit or need to cancel or reschedule your appointment, please contact [facility]  Dept: [phone]  and follow the prompts.  Office hours are 8:00 a.m. to 4:30 p.m. Monday - Friday. Please note that  voicemails left after 4:00 p.m. may not be returned until the following business day.  We are closed weekends and major holidays. You have access to a nurse at all times for urgent questions. Please call the main number to the clinic Dept: [phone] and follow the prompts.   For any non-urgent questions, you may also contact your provider using MyChart. We now offer e-Visits for anyone 3 and older to request care  online for non-urgent symptoms. For details visit [email or web address].   Also download the MyChart app! Go to the app store, search "MyChart", open the app, select [facility], and log in with your MyChart username and password.  Due to Covid, a mask is required upon entering the hospital/clinic. If you do not have a mask, one will be given to you upon arrival. For doctor visits, patients may have 1 support person aged 75 or older with them. For treatment visits, patients cannot have anyone with them due to current Covid guidelines and our immunocompromised population.   Oxaliplatin Injection What is this medication? OXALIPLATIN (ox AL i PLA tin) is a chemotherapy drug. It targets fast dividing cells, like cancer cells, and causes these cells to die. This medicine is used to treat cancers of the colon and rectum, and many other cancers. This medicine may be used for other purposes; ask your health care provider or pharmacist if you have questions. COMMON BRAND NAME(S): Eloxatin What should I tell my care team before I take this medication? They need to know if you have any of these conditions: heart disease history of irregular heartbeat liver disease low blood counts, like white cells, platelets, or red blood cells lung or breathing disease, like asthma take medicines that treat or prevent blood clots tingling of the fingers or toes, or other nerve disorder an unusual or allergic reaction to oxaliplatin, other chemotherapy, other medicines, foods, dyes, or preservatives pregnant or trying to get pregnant breast-feeding How should I use this medication? This drug is given as an infusion into a vein. It is administered in a hospital or clinic by a specially trained health care professional. Talk to your pediatrician regarding the use of this medicine in children. Special care may be needed. Overdosage: If you think you have taken too much of this medicine contact a poison control center  or emergency room at once. NOTE: This medicine is only for you. Do not share this medicine with others. What if I miss a dose? It is important not to miss a dose. Call your doctor or health care professional if you are unable to keep an appointment. What may interact with this medication? Do not take this medicine with any of the following medications: cisapride dronedarone pimozide thioridazine This medicine may also interact with the following medications: aspirin and aspirin-like medicines certain medicines that treat or prevent blood clots like warfarin, apixaban, dabigatran, and rivaroxaban cisplatin cyclosporine diuretics medicines for infection like acyclovir, adefovir, amphotericin B, bacitracin, cidofovir, foscarnet, ganciclovir, gentamicin, pentamidine, vancomycin NSAIDs, medicines for pain and inflammation, like ibuprofen or naproxen other medicines that prolong the QT interval (an abnormal heart rhythm) pamidronate zoledronic acid This list may not describe all possible interactions. Give your health care provider a list of all the medicines, herbs, non-prescription drugs, or dietary supplements you use. Also tell them if you smoke, drink alcohol, or use illegal drugs. Some items may interact with your medicine. What should I watch for while using this medication? Your condition will be monitored carefully while you are receiving  this medicine. You may need blood work done while you are taking this medicine. This medicine may make you feel generally unwell. This is not uncommon as chemotherapy can affect healthy cells as well as cancer cells. Report any side effects. Continue your course of treatment even though you feel ill unless your healthcare professional tells you to stop. This medicine can make you more sensitive to cold. Do not drink cold drinks or use ice. Cover exposed skin before coming in contact with cold temperatures or cold objects. When out in cold weather wear  warm clothing and cover your mouth and nose to warm the air that goes into your lungs. Tell your doctor if you get sensitive to the cold. Do not become pregnant while taking this medicine or for 9 months after stopping it. Women should inform their health care professional if they wish to become pregnant or think they might be pregnant. Men should not father a child while taking this medicine and for 6 months after stopping it. There is potential for serious side effects to an unborn child. Talk to your health care professional for more information. Do not breast-feed a child while taking this medicine or for 3 months after stopping it. This medicine has caused ovarian failure in some women. This medicine may make it more difficult to get pregnant. Talk to your health care professional if you are concerned about your fertility. This medicine has caused decreased sperm counts in some men. This may make it more difficult to father a child. Talk to your health care professional if you are concerned about your fertility. This medicine may increase your risk of getting an infection. Call your health care professional for advice if you get a fever, chills, or sore throat, or other symptoms of a cold or flu. Do not treat yourself. Try to avoid being around people who are sick. Avoid taking medicines that contain aspirin, acetaminophen, ibuprofen, naproxen, or ketoprofen unless instructed by your health care professional. These medicines may hide a fever. Be careful brushing or flossing your teeth or using a toothpick because you may get an infection or bleed more easily. If you have any dental work done, tell your dentist you are receiving this medicine. What side effects may I notice from receiving this medication? Side effects that you should report to your doctor or health care professional as soon as possible: allergic reactions like skin rash, itching or hives, swelling of the face, lips, or  tongue breathing problems cough low blood counts - this medicine may decrease the number of white blood cells, red blood cells, and platelets. You may be at increased risk for infections and bleeding nausea, vomiting pain, redness, or irritation at site where injected pain, tingling, numbness in the hands or feet signs and symptoms of bleeding such as bloody or black, tarry stools; red or dark brown urine; spitting up blood or brown material that looks like coffee grounds; red spots on the skin; unusual bruising or bleeding from the eyes, gums, or nose signs and symptoms of a dangerous change in heartbeat or heart rhythm like chest pain; dizziness; fast, irregular heartbeat; palpitations; feeling faint or lightheaded; falls signs and symptoms of infection like fever; chills; cough; sore throat; pain or trouble passing urine signs and symptoms of liver injury like dark yellow or brown urine; general ill feeling or flu-like symptoms; light-colored stools; loss of appetite; nausea; right upper belly pain; unusually weak or tired; yellowing of the eyes or skin signs and symptoms  of low red blood cells or anemia such as unusually weak or tired; feeling faint or lightheaded; falls signs and symptoms of muscle injury like dark urine; trouble passing urine or change in the amount of urine; unusually weak or tired; muscle pain; back pain Side effects that usually do not require medical attention (report to your doctor or health care professional if they continue or are bothersome): changes in taste diarrhea gas hair loss loss of appetite mouth sores This list may not describe all possible side effects. Call your doctor for medical advice about side effects. You may report side effects to FDA at 1-800-FDA-1088. Where should I keep my medication? This drug is given in a hospital or clinic and will not be stored at home. NOTE: This sheet is a summary. It may not cover all possible information. If you have  questions about this medicine, talk to your doctor, pharmacist, or health care provider.  2022 Elsevier/Gold Standard (2021-05-08 00:00:00)  Leucovorin injection What is this medication? LEUCOVORIN (loo koe VOR in) is used to prevent or treat the harmful effects of some medicines. This medicine is used to treat anemia caused by a low amount of folic acid in the body. It is also used with 5-fluorouracil (5-FU) to treat colon cancer. This medicine may be used for other purposes; ask your health care provider or pharmacist if you have questions. What should I tell my care team before I take this medication? They need to know if you have any of these conditions: anemia from low levels of vitamin B-12 in the blood an unusual or allergic reaction to leucovorin, folic acid, other medicines, foods, dyes, or preservatives pregnant or trying to get pregnant breast-feeding How should I use this medication? This medicine is for injection into a muscle or into a vein. It is given by a health care professional in a hospital or clinic setting. Talk to your pediatrician regarding the use of this medicine in children. Special care may be needed. Overdosage: If you think you have taken too much of this medicine contact a poison control center or emergency room at once. NOTE: This medicine is only for you. Do not share this medicine with others. What if I miss a dose? This does not apply. What may interact with this medication? capecitabine fluorouracil phenobarbital phenytoin primidone trimethoprim-sulfamethoxazole This list may not describe all possible interactions. Give your health care provider a list of all the medicines, herbs, non-prescription drugs, or dietary supplements you use. Also tell them if you smoke, drink alcohol, or use illegal drugs. Some items may interact with your medicine. What should I watch for while using this medication? Your condition will be monitored carefully while you are  receiving this medicine. This medicine may increase the side effects of 5-fluorouracil, 5-FU. Tell your doctor or health care professional if you have diarrhea or mouth sores that do not get better or that get worse. What side effects may I notice from receiving this medication? Side effects that you should report to your doctor or health care professional as soon as possible: allergic reactions like skin rash, itching or hives, swelling of the face, lips, or tongue breathing problems fever, infection mouth sores unusual bleeding or bruising unusually weak or tired Side effects that usually do not require medical attention (report to your doctor or health care professional if they continue or are bothersome): constipation or diarrhea loss of appetite nausea, vomiting This list may not describe all possible side effects. Call your doctor for  medical advice about side effects. You may report side effects to FDA at 1-800-FDA-1088. Where should I keep my medication? This drug is given in a hospital or clinic and will not be stored at home. NOTE: This sheet is a summary. It may not cover all possible information. If you have questions about this medicine, talk to your doctor, pharmacist, or health care provider.  2022 Elsevier/Gold Standard (2008-02-25 00:00:00)  Irinotecan injection What is this medication? IRINOTECAN (ir in oh TEE kan ) is a chemotherapy drug. It is used to treat colon and rectal cancer. This medicine may be used for other purposes; ask your health care provider or pharmacist if you have questions. COMMON BRAND NAME(S): Camptosar What should I tell my care team before I take this medication? They need to know if you have any of these conditions: dehydration diarrhea infection (especially a virus infection such as chickenpox, cold sores, or herpes) liver disease low blood counts, like low white cell, platelet, or red cell counts low levels of calcium, magnesium, or  potassium in the blood recent or ongoing radiation therapy an unusual or allergic reaction to irinotecan, other medicines, foods, dyes, or preservatives pregnant or trying to get pregnant breast-feeding How should I use this medication? This drug is given as an infusion into a vein. It is administered in a hospital or clinic by a specially trained health care professional. Talk to your pediatrician regarding the use of this medicine in children. Special care may be needed. Overdosage: If you think you have taken too much of this medicine contact a poison control center or emergency room at once. NOTE: This medicine is only for you. Do not share this medicine with others. What if I miss a dose? It is important not to miss your dose. Call your doctor or health care professional if you are unable to keep an appointment. What may interact with this medication? Do not take this medicine with any of the following medications: cobicistat itraconazole This medicine may interact with the following medications: antiviral medicines for HIV or AIDS certain antibiotics like rifampin or rifabutin certain medicines for fungal infections like ketoconazole, posaconazole, and voriconazole certain medicines for seizures like carbamazepine, phenobarbital, phenotoin clarithromycin gemfibrozil nefazodone [place]'s Wort This list may not describe all possible interactions. Give your health care provider a list of all the medicines, herbs, non-prescription drugs, or dietary supplements you use. Also tell them if you smoke, drink alcohol, or use illegal drugs. Some items may interact with your medicine. What should I watch for while using this medication? Your condition will be monitored carefully while you are receiving this medicine. You will need important blood work done while you are taking this medicine. This drug may make you feel generally unwell. This is not uncommon, as chemotherapy can affect healthy  cells as well as cancer cells. Report any side effects. Continue your course of treatment even though you feel ill unless your doctor tells you to stop. In some cases, you may be given additional medicines to help with side effects. Follow all directions for their use. You may get drowsy or dizzy. Do not drive, use machinery, or do anything that needs mental alertness until you know how this medicine affects you. Do not stand or sit up quickly, especially if you are an older patient. This reduces the risk of dizzy or fainting spells. Call your health care professional for advice if you get a fever, chills, or sore throat, or other symptoms of a cold  or flu. Do not treat yourself. This medicine decreases your body's ability to fight infections. Try to avoid being around people who are sick. Avoid taking products that contain aspirin, acetaminophen, ibuprofen, naproxen, or ketoprofen unless instructed by your doctor. These medicines may hide a fever. This medicine may increase your risk to bruise or bleed. Call your doctor or health care professional if you notice any unusual bleeding. Be careful brushing and flossing your teeth or using a toothpick because you may get an infection or bleed more easily. If you have any dental work done, tell your dentist you are receiving this medicine. Do not become pregnant while taking this medicine or for 6 months after stopping it. Women should inform their health care professional if they wish to become pregnant or think they might be pregnant. Men should not father a child while taking this medicine and for 3 months after stopping it. There is potential for serious side effects to an unborn child. Talk to your health care professional for more information. Do not breast-feed an infant while taking this medicine or for 7 days after stopping it. This medicine has caused ovarian failure in some women. This medicine may make it more difficult to get pregnant. Talk to your  health care professional if you are concerned about your fertility. This medicine has caused decreased sperm counts in some men. This may make it more difficult to father a child. Talk to your health care professional if you are concerned about your fertility. What side effects may I notice from receiving this medication? Side effects that you should report to your doctor or health care professional as soon as possible: allergic reactions like skin rash, itching or hives, swelling of the face, lips, or tongue chest pain diarrhea flushing, runny nose, sweating during infusion low blood counts - this medicine may decrease the number of white blood cells, red blood cells and platelets. You may be at increased risk for infections and bleeding. nausea, vomiting pain, swelling, warmth in the leg signs of decreased platelets or bleeding - bruising, pinpoint red spots on the skin, black, tarry stools, blood in the urine signs of infection - fever or chills, cough, sore throat, pain or difficulty passing urine signs of decreased red blood cells - unusually weak or tired, fainting spells, lightheadedness Side effects that usually do not require medical attention (report to your doctor or health care professional if they continue or are bothersome): constipation hair loss headache loss of appetite mouth sores stomach pain This list may not describe all possible side effects. Call your doctor for medical advice about side effects. You may report side effects to FDA at 1-800-FDA-1088. Where should I keep my medication? This drug is given in a hospital or clinic and will not be stored at home. NOTE: This sheet is a summary. It may not cover all possible information. If you have questions about this medicine, talk to your doctor, pharmacist, or health care provider.  2022 Elsevier/Gold Standard (2021-05-08 00:00:00)  Fluorouracil, 5-FU injection What is this medication? FLUOROURACIL, 5-FU (flure oh  YOOR a sil) is a chemotherapy drug. It slows the growth of cancer cells. This medicine is used to treat many types of cancer like breast cancer, colon or rectal cancer, pancreatic cancer, and stomach cancer. This medicine may be used for other purposes; ask your health care provider or pharmacist if you have questions. COMMON BRAND NAME(S): Adrucil What should I tell my care team before I take this medication? They need  to know if you have any of these conditions: blood disorders dihydropyrimidine dehydrogenase (DPD) deficiency infection (especially a virus infection such as chickenpox, cold sores, or herpes) kidney disease liver disease malnourished, poor nutrition recent or ongoing radiation therapy an unusual or allergic reaction to fluorouracil, other chemotherapy, other medicines, foods, dyes, or preservatives pregnant or trying to get pregnant breast-feeding How should I use this medication? This drug is given as an infusion or injection into a vein. It is administered in a hospital or clinic by a specially trained health care professional. Talk to your pediatrician regarding the use of this medicine in children. Special care may be needed. Overdosage: If you think you have taken too much of this medicine contact a poison control center or emergency room at once. NOTE: This medicine is only for you. Do not share this medicine with others. What if I miss a dose? It is important not to miss your dose. Call your doctor or health care professional if you are unable to keep an appointment. What may interact with this medication? Do not take this medicine with any of the following medications: live virus vaccines This medicine may also interact with the following medications: medicines that treat or prevent blood clots like warfarin, enoxaparin, and dalteparin This list may not describe all possible interactions. Give your health care provider a list of all the medicines, herbs,  non-prescription drugs, or dietary supplements you use. Also tell them if you smoke, drink alcohol, or use illegal drugs. Some items may interact with your medicine. What should I watch for while using this medication? Visit your doctor for checks on your progress. This drug may make you feel generally unwell. This is not uncommon, as chemotherapy can affect healthy cells as well as cancer cells. Report any side effects. Continue your course of treatment even though you feel ill unless your doctor tells you to stop. In some cases, you may be given additional medicines to help with side effects. Follow all directions for their use. Call your doctor or health care professional for advice if you get a fever, chills or sore throat, or other symptoms of a cold or flu. Do not treat yourself. This drug decreases your body's ability to fight infections. Try to avoid being around people who are sick. This medicine may increase your risk to bruise or bleed. Call your doctor or health care professional if you notice any unusual bleeding. Be careful brushing and flossing your teeth or using a toothpick because you may get an infection or bleed more easily. If you have any dental work done, tell your dentist you are receiving this medicine. Avoid taking products that contain aspirin, acetaminophen, ibuprofen, naproxen, or ketoprofen unless instructed by your doctor. These medicines may hide a fever. Do not become pregnant while taking this medicine. Women should inform their doctor if they wish to become pregnant or think they might be pregnant. There is a potential for serious side effects to an unborn child. Talk to your health care professional or pharmacist for more information. Do not breast-feed an infant while taking this medicine. Men should inform their doctor if they wish to father a child. This medicine may lower sperm counts. Do not treat diarrhea with over the counter products. Contact your doctor if you  have diarrhea that lasts more than 2 days or if it is severe and watery. This medicine can make you more sensitive to the sun. Keep out of the sun. If you cannot avoid being in  the sun, wear protective clothing and use sunscreen. Do not use sun lamps or tanning beds/booths. What side effects may I notice from receiving this medication? Side effects that you should report to your doctor or health care professional as soon as possible: allergic reactions like skin rash, itching or hives, swelling of the face, lips, or tongue low blood counts - this medicine may decrease the number of white blood cells, red blood cells and platelets. You may be at increased risk for infections and bleeding. signs of infection - fever or chills, cough, sore throat, pain or difficulty passing urine signs of decreased platelets or bleeding - bruising, pinpoint red spots on the skin, black, tarry stools, blood in the urine signs of decreased red blood cells - unusually weak or tired, fainting spells, lightheadedness breathing problems changes in vision chest pain mouth sores nausea and vomiting pain, swelling, redness at site where injected pain, tingling, numbness in the hands or feet redness, swelling, or sores on hands or feet stomach pain unusual bleeding Side effects that usually do not require medical attention (report to your doctor or health care professional if they continue or are bothersome): changes in finger or toe nails diarrhea dry or itchy skin hair loss headache loss of appetite sensitivity of eyes to the light stomach upset unusually teary eyes This list may not describe all possible side effects. Call your doctor for medical advice about side effects. You may report side effects to FDA at 1-800-FDA-1088. Where should I keep my medication? This drug is given in a hospital or clinic and will not be stored at home. NOTE: This sheet is a summary. It may not cover all possible information. If  you have questions about this medicine, talk to your doctor, pharmacist, or health care provider.  2022 [employer] (2021-05-08 00:00:00)

## 2021-08-21 ENCOUNTER — Ambulatory Visit: Payer: Medicare HMO | Admitting: Oncology

## 2021-08-21 ENCOUNTER — Ambulatory Visit: Payer: Medicare HMO

## 2021-08-21 LAB — CANCER ANTIGEN 19-9: CA 19-9: 32 U/mL (ref 0–35)

## 2021-08-22 ENCOUNTER — Other Ambulatory Visit: Payer: Self-pay

## 2021-08-22 ENCOUNTER — Inpatient Hospital Stay: Payer: Medicare HMO

## 2021-08-22 VITALS — BP 119/71 | HR 61 | Temp 98.0°F | Resp 20

## 2021-08-22 DIAGNOSIS — C772 Secondary and unspecified malignant neoplasm of intra-abdominal lymph nodes: Secondary | ICD-10-CM | POA: Diagnosis not present

## 2021-08-22 DIAGNOSIS — Z85828 Personal history of other malignant neoplasm of skin: Secondary | ICD-10-CM | POA: Diagnosis not present

## 2021-08-22 DIAGNOSIS — Z79899 Other long term (current) drug therapy: Secondary | ICD-10-CM | POA: Diagnosis not present

## 2021-08-22 DIAGNOSIS — K219 Gastro-esophageal reflux disease without esophagitis: Secondary | ICD-10-CM | POA: Diagnosis not present

## 2021-08-22 DIAGNOSIS — C250 Malignant neoplasm of head of pancreas: Secondary | ICD-10-CM

## 2021-08-22 DIAGNOSIS — Z5189 Encounter for other specified aftercare: Secondary | ICD-10-CM | POA: Diagnosis not present

## 2021-08-22 DIAGNOSIS — I10 Essential (primary) hypertension: Secondary | ICD-10-CM | POA: Diagnosis not present

## 2021-08-22 DIAGNOSIS — Z5111 Encounter for antineoplastic chemotherapy: Secondary | ICD-10-CM | POA: Diagnosis not present

## 2021-08-22 DIAGNOSIS — E785 Hyperlipidemia, unspecified: Secondary | ICD-10-CM | POA: Diagnosis not present

## 2021-08-22 LAB — CULTURE, BLOOD (SINGLE)
Culture: NO GROWTH
Culture: NO GROWTH
Culture: NO GROWTH
Special Requests: ADEQUATE

## 2021-08-22 MED ORDER — PEGFILGRASTIM-CBQV 6 MG/0.6ML ~~LOC~~ SOSY
6.0000 mg | PREFILLED_SYRINGE | Freq: Once | SUBCUTANEOUS | Status: AC
  Administered 2021-08-22: 14:00:00 6 mg via SUBCUTANEOUS

## 2021-08-22 MED ORDER — HEPARIN SOD (PORK) LOCK FLUSH 100 UNIT/ML IV SOLN
500.0000 [IU] | Freq: Once | INTRAVENOUS | Status: AC | PRN
  Administered 2021-08-22: 14:00:00 500 [IU]

## 2021-08-22 MED ORDER — SODIUM CHLORIDE 0.9% FLUSH
10.0000 mL | INTRAVENOUS | Status: DC | PRN
  Administered 2021-08-22: 14:00:00 10 mL

## 2021-08-22 NOTE — Patient Instructions (Signed)

## 2021-08-29 ENCOUNTER — Other Ambulatory Visit (HOSPITAL_BASED_OUTPATIENT_CLINIC_OR_DEPARTMENT_OTHER): Payer: Self-pay

## 2021-08-29 DIAGNOSIS — K649 Unspecified hemorrhoids: Secondary | ICD-10-CM | POA: Diagnosis not present

## 2021-08-29 MED ORDER — "NITROGLYCERIN NICU 2% OINTMENT "
TOPICAL_OINTMENT | TRANSDERMAL | 0 refills | Status: DC
  Filled 2021-08-29: qty 30, 14d supply, fill #0

## 2021-08-29 MED ORDER — HYDROCORTISONE (PERIANAL) 2.5 % EX CREA
TOPICAL_CREAM | CUTANEOUS | 0 refills | Status: DC
  Filled 2021-08-29: qty 30, 14d supply, fill #0

## 2021-08-30 ENCOUNTER — Other Ambulatory Visit (HOSPITAL_BASED_OUTPATIENT_CLINIC_OR_DEPARTMENT_OTHER): Payer: Self-pay

## 2021-09-03 ENCOUNTER — Other Ambulatory Visit: Payer: Self-pay | Admitting: Oncology

## 2021-09-04 ENCOUNTER — Inpatient Hospital Stay: Payer: Medicare HMO | Attending: Oncology

## 2021-09-04 ENCOUNTER — Inpatient Hospital Stay: Payer: Medicare HMO | Admitting: Nurse Practitioner

## 2021-09-04 ENCOUNTER — Inpatient Hospital Stay: Payer: Medicare HMO

## 2021-09-04 ENCOUNTER — Other Ambulatory Visit (HOSPITAL_BASED_OUTPATIENT_CLINIC_OR_DEPARTMENT_OTHER): Payer: Self-pay

## 2021-09-04 ENCOUNTER — Other Ambulatory Visit: Payer: Self-pay

## 2021-09-04 ENCOUNTER — Encounter: Payer: Self-pay | Admitting: Nurse Practitioner

## 2021-09-04 VITALS — BP 139/69 | HR 56 | Resp 18

## 2021-09-04 VITALS — BP 112/77 | HR 90 | Temp 97.8°F | Resp 20 | Ht 66.0 in | Wt 153.4 lb

## 2021-09-04 DIAGNOSIS — Z5189 Encounter for other specified aftercare: Secondary | ICD-10-CM | POA: Insufficient documentation

## 2021-09-04 DIAGNOSIS — C250 Malignant neoplasm of head of pancreas: Secondary | ICD-10-CM

## 2021-09-04 DIAGNOSIS — C772 Secondary and unspecified malignant neoplasm of intra-abdominal lymph nodes: Secondary | ICD-10-CM | POA: Insufficient documentation

## 2021-09-04 DIAGNOSIS — Z5111 Encounter for antineoplastic chemotherapy: Secondary | ICD-10-CM | POA: Diagnosis present

## 2021-09-04 DIAGNOSIS — E785 Hyperlipidemia, unspecified: Secondary | ICD-10-CM | POA: Insufficient documentation

## 2021-09-04 DIAGNOSIS — I10 Essential (primary) hypertension: Secondary | ICD-10-CM | POA: Insufficient documentation

## 2021-09-04 DIAGNOSIS — Z79899 Other long term (current) drug therapy: Secondary | ICD-10-CM | POA: Insufficient documentation

## 2021-09-04 LAB — CBC WITH DIFFERENTIAL (CANCER CENTER ONLY)
Abs Immature Granulocytes: 0.12 10*3/uL — ABNORMAL HIGH (ref 0.00–0.07)
Basophils Absolute: 0 10*3/uL (ref 0.0–0.1)
Basophils Relative: 0 %
Eosinophils Absolute: 0 10*3/uL (ref 0.0–0.5)
Eosinophils Relative: 0 %
HCT: 32.2 % — ABNORMAL LOW (ref 39.0–52.0)
Hemoglobin: 10.5 g/dL — ABNORMAL LOW (ref 13.0–17.0)
Immature Granulocytes: 2 %
Lymphocytes Relative: 24 %
Lymphs Abs: 1.9 10*3/uL (ref 0.7–4.0)
MCH: 30.4 pg (ref 26.0–34.0)
MCHC: 32.6 g/dL (ref 30.0–36.0)
MCV: 93.3 fL (ref 80.0–100.0)
Monocytes Absolute: 0.9 10*3/uL (ref 0.1–1.0)
Monocytes Relative: 11 %
Neutro Abs: 5 10*3/uL (ref 1.7–7.7)
Neutrophils Relative %: 63 %
Platelet Count: 114 10*3/uL — ABNORMAL LOW (ref 150–400)
RBC: 3.45 MIL/uL — ABNORMAL LOW (ref 4.22–5.81)
RDW: 18.3 % — ABNORMAL HIGH (ref 11.5–15.5)
WBC Count: 7.9 10*3/uL (ref 4.0–10.5)
nRBC: 0.3 % — ABNORMAL HIGH (ref 0.0–0.2)

## 2021-09-04 LAB — CMP (CANCER CENTER ONLY)
ALT: 24 U/L (ref 0–44)
AST: 25 U/L (ref 15–41)
Albumin: 3.9 g/dL (ref 3.5–5.0)
Alkaline Phosphatase: 118 U/L (ref 38–126)
Anion gap: 9 (ref 5–15)
BUN: 14 mg/dL (ref 8–23)
CO2: 25 mmol/L (ref 22–32)
Calcium: 8.9 mg/dL (ref 8.9–10.3)
Chloride: 104 mmol/L (ref 98–111)
Creatinine: 0.99 mg/dL (ref 0.61–1.24)
GFR, Estimated: >60 mL/min (ref >60)
Glucose, Bld: 107 mg/dL — ABNORMAL HIGH (ref 70–99)
Potassium: 3.8 mmol/L (ref 3.5–5.1)
Sodium: 138 mmol/L (ref 135–145)
Total Bilirubin: 0.4 mg/dL (ref 0.3–1.2)
Total Protein: 6.3 g/dL — ABNORMAL LOW (ref 6.5–8.1)

## 2021-09-04 LAB — MAGNESIUM: Magnesium: 2 mg/dL (ref 1.7–2.4)

## 2021-09-04 MED ORDER — SODIUM CHLORIDE 0.9 % IV SOLN
2000.0000 mg/m2 | INTRAVENOUS | Status: DC
  Administered 2021-09-04: 3650 mg via INTRAVENOUS
  Filled 2021-09-04: qty 73

## 2021-09-04 MED ORDER — DEXTROSE 5 % IV SOLN: Freq: Once | INTRAVENOUS | Status: AC

## 2021-09-04 MED ORDER — SODIUM CHLORIDE 0.9 % IV SOLN
400.0000 mg/m2 | Freq: Once | INTRAVENOUS | Status: AC
  Administered 2021-09-04: 728 mg via INTRAVENOUS
  Filled 2021-09-04: qty 36.4

## 2021-09-04 MED ORDER — SODIUM CHLORIDE 0.9 % IV SOLN
120.0000 mg/m2 | Freq: Once | INTRAVENOUS | Status: AC
  Administered 2021-09-04: 220 mg via INTRAVENOUS
  Filled 2021-09-04: qty 11

## 2021-09-04 MED ORDER — SODIUM CHLORIDE 0.9% FLUSH
10.0000 mL | INTRAVENOUS | Status: DC | PRN
  Administered 2021-09-04: 10 mL

## 2021-09-04 MED ORDER — SODIUM CHLORIDE 0.9 % IV SOLN
150.0000 mg | Freq: Once | INTRAVENOUS | Status: AC
  Administered 2021-09-04: 150 mg via INTRAVENOUS
  Filled 2021-09-04: qty 5

## 2021-09-04 MED ORDER — SODIUM CHLORIDE 0.9 % IV SOLN
10.0000 mg | Freq: Once | INTRAVENOUS | Status: AC
  Administered 2021-09-04: 10 mg via INTRAVENOUS
  Filled 2021-09-04: qty 1

## 2021-09-04 MED ORDER — PALONOSETRON HCL INJECTION 0.25 MG/5ML
0.2500 mg | Freq: Once | INTRAVENOUS | Status: AC
  Administered 2021-09-04: 0.25 mg via INTRAVENOUS
  Filled 2021-09-04: qty 5

## 2021-09-04 MED ORDER — OXALIPLATIN CHEMO INJECTION 100 MG/20ML
150.0000 mg | Freq: Once | INTRAVENOUS | Status: AC
  Administered 2021-09-04: 150 mg via INTRAVENOUS
  Filled 2021-09-04: qty 20

## 2021-09-04 MED ORDER — DEXAMETHASONE 4 MG PO TABS
ORAL_TABLET | ORAL | 1 refills | Status: DC
  Filled 2021-09-04: qty 12, 6d supply, fill #0

## 2021-09-04 MED ORDER — ATROPINE SULFATE 1 MG/ML IV SOLN
0.5000 mg | Freq: Once | INTRAVENOUS | Status: AC | PRN
  Administered 2021-09-04: 0.5 mg via INTRAVENOUS
  Filled 2021-09-04: qty 1

## 2021-09-04 NOTE — Progress Notes (Signed)
Patient seen by Lisa Thomas NP today  Vitals are within treatment parameters.  Labs reviewed by Lisa Thomas NP and are within treatment parameters.  Per physician team, patient is ready for treatment and there are NO modifications to the treatment plan.     

## 2021-09-04 NOTE — Patient Instructions (Signed)

## 2021-09-04 NOTE — Progress Notes (Signed)
Patient presents for treatment. RN assessment completed along with the following:  Labs/vitals reviewed - Yes, and within treatment parameters.   Weight within 10% of previous measurement - Yes Oncology Treatment Attestation completed for current therapy- Yes, on date 05/25/21 Informed consent completed and reflects current therapy/intent - Yes, on date 06/19/21             Provider progress note reviewed - Yes, today's provider note was reviewed. Treatment/Antibody/Supportive plan reviewed - Yes, and there are no adjustments needed for today's treatment. S&H and other orders reviewed - Yes, and there are no additional orders identified. Previous treatment date reviewed - Yes, and the appropriate amount of time has elapsed between treatments. Clinic Hand Off Received from - Ned Card, NP  Patient to proceed with treatment.

## 2021-09-04 NOTE — Progress Notes (Signed)
°  [facility] OFFICE PROGRESS NOTE   Diagnosis: Pancreas cancer  INTERVAL HISTORY:   David Jarvis returns as scheduled.  He completed cycle 5 FOLFIRINOX 08/20/2021.  He noted more nausea following the most recent cycle of chemotherapy.  No vomiting.  He developed constipation.  Bowels are now moving.  Cold sensitivity lasted about 1 week.  No persistent neuropathy symptoms.  No mouth sores.  No further fever.  Objective:  Vital signs in last 24 hours:  Blood pressure 112/77, pulse 90, temperature 97.8 F (36.6 C), temperature source Oral, resp. rate 20, height 5\' 6"  (1.676 m), weight 153 lb 6.4 oz (69.6 kg), SpO2 100 %.    HEENT: No thrush or ulcers. Resp: Lungs clear bilaterally. Cardio: Regular rate and rhythm. GI: Abdomen soft and nontender.  No hepatosplenomegaly.  No mass. Vascular: No leg edema. Neuro: Vibratory sense mildly decreased over the fingertips per tuning fork exam. Skin: Palms without erythema. Port-A-Cath without erythema.   Lab Results:  Lab Results  Component Value Date   WBC 7.9 09/04/2021   HGB 10.5 (L) 09/04/2021   HCT 32.2 (L) 09/04/2021   MCV 93.3 09/04/2021   PLT 114 (L) 09/04/2021   NEUTROABS 5.0 09/04/2021    Imaging:  No results found.  Medications: I have reviewed the patient's current medications.  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, stent placed EUS 05/23/2021-25 x 15 mm pancreas head mass with irregular borders, no lymphadenopathy, no vascular involvement, T2 N0 by ultrasound CT chest 06/01/2021-negative for metastatic disease CT abdomen pancreas protocol 06/01/2021-3.2 x 2.3 cm hypoenhancing pancreas head mass inseparable from the third portion of the duodenum, 1 cm left retroperitoneal lymph  node, no vascular involvement Cycle 1 FOLFIRINOX 06/19/2021 Cycle 2 FOLFIRINOX 07/02/2021, irinotecan and 5-FU dose reduced, prophylactic dexamethasone added beginning day of pump discontinuation Cycle 3 FOLFIRINOX 07/23/2021, Udenyca CT abdomen/pelvis 08/01/2021-done in the emergency department to evaluate epigastric pain-pancreas head mass decreased in size.  Diffuse gaseous distention of large and small bowel.  No bowel obstruction or focal inflammation. Cycle 4 FOLFIRINOX 08/06/2021, Udenyca Cycle 5 FOLFIRINOX 08/20/2021, Udenyca Cycle 6 FOLFIRINOX 09/04/2021, Udenyca Obstructive jaundice secondary #1 Hypertension Admission in February 2022 with elevated liver enzymes MRI/MRCP-intra and extrahepatic biliary duct dilation, tapering smoothly to the ampulla, no mass or filling defect ERCP 10/06/2020-dilated main bile duct, biliary tree was swept and nothing was found, cytology from bile duct brushing-no malignant cells identified 5.  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  GERD 7.  Squamous cell carcinoma left forearm 2014 8.  Hyperlipidemia 9.  CT chest 08/01/2021, done in the emergency department to evaluate pleuritic chest pain-no PE.  Advanced three-vessel coronary vascular calcification. 10.  Fever of unknown origin beginning 08/14/2021-blood and urine cultures negative, no source for infection identified, placed on antibiotic prophylaxis beginning 08/17/2021    Disposition: David Jarvis appears stable.  He has completed 5 cycles of FOLFIRINOX.  He continues to tolerate the chemotherapy well overall.  Plan to proceed with cycle 6 today as scheduled.  CBC from today reviewed.  Counts adequate to proceed with treatment.  He will return for lab, follow-up, cycle 7 FOLFIRINOX in 2 weeks.  We are available to see him sooner if needed.    Ned Card ANP/GNP-BC   09/04/2021  8:44 AM

## 2021-09-04 NOTE — Patient Instructions (Addendum)
[place]   The chemotherapy medication bag should finish at 46 hours, 96 hours, or 7 days. For example, if your pump is scheduled for 46 hours and it was put on at 4:00 p.m., it should finish at 2:00 p.m. the day it is scheduled to come off regardless of your appointment time.     Estimated time to finish at 12pm on Thursday, September 06, 2021.    If the display on your pump reads "Low Volume" and it is beeping, take the batteries out of the pump and come to the cancer center for it to be taken off.   If the pump alarms go off prior to the pump reading "Low Volume" then call [phone] and someone can assist you.  If the plunger comes out and the chemotherapy medication is leaking out, please use your home chemo spill kit to clean up the spill. Do NOT use paper towels or other household products.  If you have problems or questions regarding your pump, please call either [phone] (24 hours a day) or the cancer center Monday-Friday 8:00 a.m.- 4:30 p.m. at the clinic number and we will assist you. If you are unable to get assistance, then go to the nearest Emergency Department and ask the staff to contact the IV team for assistance.  Discharge Instructions: Thank you for choosing [facility] to provide your oncology and hematology care.   If you have a lab appointment with the [facility], please go directly to the [facility] and check in at the registration area.   Wear comfortable clothing and clothing appropriate for easy access to any Portacath or PICC line.   We strive to give you quality time with your provider. You may need to reschedule your appointment if you arrive late (15 or more minutes).  Arriving late affects you and other patients whose appointments are after yours.  Also, if you miss three or more appointments without notifying the office, you may be dismissed from the clinic at the providers discretion.      For  prescription refill requests, have your pharmacy contact our office and allow 72 hours for refills to be completed.    Today you received the following chemotherapy and/or immunotherapy agents Oxaliplatin, leucovorin, irinotecan, fluorouracil.      To help prevent nausea and vomiting after your treatment, we encourage you to take your nausea medication as directed.  BELOW ARE SYMPTOMS THAT SHOULD BE REPORTED IMMEDIATELY: *FEVER GREATER THAN 100.4 F (38 C) OR HIGHER *CHILLS OR SWEATING *NAUSEA AND VOMITING THAT IS NOT CONTROLLED WITH YOUR NAUSEA MEDICATION *UNUSUAL SHORTNESS OF BREATH *UNUSUAL BRUISING OR BLEEDING *URINARY PROBLEMS (pain or burning when urinating, or frequent urination) *BOWEL PROBLEMS (unusual diarrhea, constipation, pain near the anus) TENDERNESS IN MOUTH AND THROAT WITH OR WITHOUT PRESENCE OF ULCERS (sore throat, sores in mouth, or a toothache) UNUSUAL RASH, SWELLING OR PAIN  UNUSUAL VAGINAL DISCHARGE OR ITCHING   Items with * indicate a potential emergency and should be followed up as soon as possible or go to the Emergency Department if any problems should occur.  Please show the CHEMOTHERAPY ALERT CARD or IMMUNOTHERAPY ALERT CARD at check-in to the Emergency Department and triage nurse.  Should you have questions after your visit or need to cancel or reschedule your appointment, please contact [facility]  Dept: [phone]  and follow the prompts.  Office hours are 8:00 a.m. to 4:30 p.m. Monday - Friday. Please  note that voicemails left after 4:00 p.m. may not be returned until the following business day.  We are closed weekends and major holidays. You have access to a nurse at all times for urgent questions. Please call the main number to the clinic Dept: [phone] and follow the prompts.   For any non-urgent questions, you may also contact your provider using MyChart. We now offer e-Visits for anyone 66 and older to request care  online for non-urgent symptoms. For details visit [email or web address].   Also download the MyChart app! Go to the app store, search "MyChart", open the app, select [facility], and log in with your MyChart username and password.  Due to Covid, a mask is required upon entering the hospital/clinic. If you do not have a mask, one will be given to you upon arrival. For doctor visits, patients may have 1 support person aged 37 or older with them. For treatment visits, patients cannot have anyone with them due to current Covid guidelines and our immunocompromised population.   Oxaliplatin Injection What is this medication? OXALIPLATIN (ox AL i PLA tin) is a chemotherapy drug. It targets fast dividing cells, like cancer cells, and causes these cells to die. This medicine is used to treat cancers of the colon and rectum, and many other cancers. This medicine may be used for other purposes; ask your health care provider or pharmacist if you have questions. COMMON BRAND NAME(S): Eloxatin What should I tell my care team before I take this medication? They need to know if you have any of these conditions: heart disease history of irregular heartbeat liver disease low blood counts, like white cells, platelets, or red blood cells lung or breathing disease, like asthma take medicines that treat or prevent blood clots tingling of the fingers or toes, or other nerve disorder an unusual or allergic reaction to oxaliplatin, other chemotherapy, other medicines, foods, dyes, or preservatives pregnant or trying to get pregnant breast-feeding How should I use this medication? This drug is given as an infusion into a vein. It is administered in a hospital or clinic by a specially trained health care professional. Talk to your pediatrician regarding the use of this medicine in children. Special care may be needed. Overdosage: If you think you have taken too much of this medicine contact a poison control center  or emergency room at once. NOTE: This medicine is only for you. Do not share this medicine with others. What if I miss a dose? It is important not to miss a dose. Call your doctor or health care professional if you are unable to keep an appointment. What may interact with this medication? Do not take this medicine with any of the following medications: cisapride dronedarone pimozide thioridazine This medicine may also interact with the following medications: aspirin and aspirin-like medicines certain medicines that treat or prevent blood clots like warfarin, apixaban, dabigatran, and rivaroxaban cisplatin cyclosporine diuretics medicines for infection like acyclovir, adefovir, amphotericin B, bacitracin, cidofovir, foscarnet, ganciclovir, gentamicin, pentamidine, vancomycin NSAIDs, medicines for pain and inflammation, like ibuprofen or naproxen other medicines that prolong the QT interval (an abnormal heart rhythm) pamidronate zoledronic acid This list may not describe all possible interactions. Give your health care provider a list of all the medicines, herbs, non-prescription drugs, or dietary supplements you use. Also tell them if you smoke, drink alcohol, or use illegal drugs. Some items may interact with your medicine. What should I watch for while using this medication? Your condition will be monitored carefully while you  are receiving this medicine. You may need blood work done while you are taking this medicine. This medicine may make you feel generally unwell. This is not uncommon as chemotherapy can affect healthy cells as well as cancer cells. Report any side effects. Continue your course of treatment even though you feel ill unless your healthcare professional tells you to stop. This medicine can make you more sensitive to cold. Do not drink cold drinks or use ice. Cover exposed skin before coming in contact with cold temperatures or cold objects. When out in cold weather wear  warm clothing and cover your mouth and nose to warm the air that goes into your lungs. Tell your doctor if you get sensitive to the cold. Do not become pregnant while taking this medicine or for 9 months after stopping it. Women should inform their health care professional if they wish to become pregnant or think they might be pregnant. Men should not father a child while taking this medicine and for 6 months after stopping it. There is potential for serious side effects to an unborn child. Talk to your health care professional for more information. Do not breast-feed a child while taking this medicine or for 3 months after stopping it. This medicine has caused ovarian failure in some women. This medicine may make it more difficult to get pregnant. Talk to your health care professional if you are concerned about your fertility. This medicine has caused decreased sperm counts in some men. This may make it more difficult to father a child. Talk to your health care professional if you are concerned about your fertility. This medicine may increase your risk of getting an infection. Call your health care professional for advice if you get a fever, chills, or sore throat, or other symptoms of a cold or flu. Do not treat yourself. Try to avoid being around people who are sick. Avoid taking medicines that contain aspirin, acetaminophen, ibuprofen, naproxen, or ketoprofen unless instructed by your health care professional. These medicines may hide a fever. Be careful brushing or flossing your teeth or using a toothpick because you may get an infection or bleed more easily. If you have any dental work done, tell your dentist you are receiving this medicine. What side effects may I notice from receiving this medication? Side effects that you should report to your doctor or health care professional as soon as possible: allergic reactions like skin rash, itching or hives, swelling of the face, lips, or  tongue breathing problems cough low blood counts - this medicine may decrease the number of white blood cells, red blood cells, and platelets. You may be at increased risk for infections and bleeding nausea, vomiting pain, redness, or irritation at site where injected pain, tingling, numbness in the hands or feet signs and symptoms of bleeding such as bloody or black, tarry stools; red or dark brown urine; spitting up blood or brown material that looks like coffee grounds; red spots on the skin; unusual bruising or bleeding from the eyes, gums, or nose signs and symptoms of a dangerous change in heartbeat or heart rhythm like chest pain; dizziness; fast, irregular heartbeat; palpitations; feeling faint or lightheaded; falls signs and symptoms of infection like fever; chills; cough; sore throat; pain or trouble passing urine signs and symptoms of liver injury like dark yellow or brown urine; general ill feeling or flu-like symptoms; light-colored stools; loss of appetite; nausea; right upper belly pain; unusually weak or tired; yellowing of the eyes or skin signs  and symptoms of low red blood cells or anemia such as unusually weak or tired; feeling faint or lightheaded; falls signs and symptoms of muscle injury like dark urine; trouble passing urine or change in the amount of urine; unusually weak or tired; muscle pain; back pain Side effects that usually do not require medical attention (report to your doctor or health care professional if they continue or are bothersome): changes in taste diarrhea gas hair loss loss of appetite mouth sores This list may not describe all possible side effects. Call your doctor for medical advice about side effects. You may report side effects to FDA at 1-800-FDA-1088. Where should I keep my medication? This drug is given in a hospital or clinic and will not be stored at home. NOTE: This sheet is a summary. It may not cover all possible information. If you have  questions about this medicine, talk to your doctor, pharmacist, or health care provider.  2022 Elsevier/Gold Standard (2021-05-08 00:00:00)  Leucovorin injection What is this medication? LEUCOVORIN (loo koe VOR in) is used to prevent or treat the harmful effects of some medicines. This medicine is used to treat anemia caused by a low amount of folic acid in the body. It is also used with 5-fluorouracil (5-FU) to treat colon cancer. This medicine may be used for other purposes; ask your health care provider or pharmacist if you have questions. What should I tell my care team before I take this medication? They need to know if you have any of these conditions: anemia from low levels of vitamin B-12 in the blood an unusual or allergic reaction to leucovorin, folic acid, other medicines, foods, dyes, or preservatives pregnant or trying to get pregnant breast-feeding How should I use this medication? This medicine is for injection into a muscle or into a vein. It is given by a health care professional in a hospital or clinic setting. Talk to your pediatrician regarding the use of this medicine in children. Special care may be needed. Overdosage: If you think you have taken too much of this medicine contact a poison control center or emergency room at once. NOTE: This medicine is only for you. Do not share this medicine with others. What if I miss a dose? This does not apply. What may interact with this medication? capecitabine fluorouracil phenobarbital phenytoin primidone trimethoprim-sulfamethoxazole This list may not describe all possible interactions. Give your health care provider a list of all the medicines, herbs, non-prescription drugs, or dietary supplements you use. Also tell them if you smoke, drink alcohol, or use illegal drugs. Some items may interact with your medicine. What should I watch for while using this medication? Your condition will be monitored carefully while you are  receiving this medicine. This medicine may increase the side effects of 5-fluorouracil, 5-FU. Tell your doctor or health care professional if you have diarrhea or mouth sores that do not get better or that get worse. What side effects may I notice from receiving this medication? Side effects that you should report to your doctor or health care professional as soon as possible: allergic reactions like skin rash, itching or hives, swelling of the face, lips, or tongue breathing problems fever, infection mouth sores unusual bleeding or bruising unusually weak or tired Side effects that usually do not require medical attention (report to your doctor or health care professional if they continue or are bothersome): constipation or diarrhea loss of appetite nausea, vomiting This list may not describe all possible side effects. Call your  doctor for medical advice about side effects. You may report side effects to FDA at 1-800-FDA-1088. Where should I keep my medication? This drug is given in a hospital or clinic and will not be stored at home. NOTE: This sheet is a summary. It may not cover all possible information. If you have questions about this medicine, talk to your doctor, pharmacist, or health care provider.  2022 Elsevier/Gold Standard (2008-02-25 00:00:00)  Irinotecan injection What is this medication? IRINOTECAN (ir in oh TEE kan ) is a chemotherapy drug. It is used to treat colon and rectal cancer. This medicine may be used for other purposes; ask your health care provider or pharmacist if you have questions. COMMON BRAND NAME(S): Camptosar What should I tell my care team before I take this medication? They need to know if you have any of these conditions: dehydration diarrhea infection (especially a virus infection such as chickenpox, cold sores, or herpes) liver disease low blood counts, like low white cell, platelet, or red cell counts low levels of calcium, magnesium, or  potassium in the blood recent or ongoing radiation therapy an unusual or allergic reaction to irinotecan, other medicines, foods, dyes, or preservatives pregnant or trying to get pregnant breast-feeding How should I use this medication? This drug is given as an infusion into a vein. It is administered in a hospital or clinic by a specially trained health care professional. Talk to your pediatrician regarding the use of this medicine in children. Special care may be needed. Overdosage: If you think you have taken too much of this medicine contact a poison control center or emergency room at once. NOTE: This medicine is only for you. Do not share this medicine with others. What if I miss a dose? It is important not to miss your dose. Call your doctor or health care professional if you are unable to keep an appointment. What may interact with this medication? Do not take this medicine with any of the following medications: cobicistat itraconazole This medicine may interact with the following medications: antiviral medicines for HIV or AIDS certain antibiotics like rifampin or rifabutin certain medicines for fungal infections like ketoconazole, posaconazole, and voriconazole certain medicines for seizures like carbamazepine, phenobarbital, phenotoin clarithromycin gemfibrozil nefazodone [place]'s Wort This list may not describe all possible interactions. Give your health care provider a list of all the medicines, herbs, non-prescription drugs, or dietary supplements you use. Also tell them if you smoke, drink alcohol, or use illegal drugs. Some items may interact with your medicine. What should I watch for while using this medication? Your condition will be monitored carefully while you are receiving this medicine. You will need important blood work done while you are taking this medicine. This drug may make you feel generally unwell. This is not uncommon, as chemotherapy can affect healthy  cells as well as cancer cells. Report any side effects. Continue your course of treatment even though you feel ill unless your doctor tells you to stop. In some cases, you may be given additional medicines to help with side effects. Follow all directions for their use. You may get drowsy or dizzy. Do not drive, use machinery, or do anything that needs mental alertness until you know how this medicine affects you. Do not stand or sit up quickly, especially if you are an older patient. This reduces the risk of dizzy or fainting spells. Call your health care professional for advice if you get a fever, chills, or sore throat, or other symptoms of  a cold or flu. Do not treat yourself. This medicine decreases your body's ability to fight infections. Try to avoid being around people who are sick. Avoid taking products that contain aspirin, acetaminophen, ibuprofen, naproxen, or ketoprofen unless instructed by your doctor. These medicines may hide a fever. This medicine may increase your risk to bruise or bleed. Call your doctor or health care professional if you notice any unusual bleeding. Be careful brushing and flossing your teeth or using a toothpick because you may get an infection or bleed more easily. If you have any dental work done, tell your dentist you are receiving this medicine. Do not become pregnant while taking this medicine or for 6 months after stopping it. Women should inform their health care professional if they wish to become pregnant or think they might be pregnant. Men should not father a child while taking this medicine and for 3 months after stopping it. There is potential for serious side effects to an unborn child. Talk to your health care professional for more information. Do not breast-feed an infant while taking this medicine or for 7 days after stopping it. This medicine has caused ovarian failure in some women. This medicine may make it more difficult to get pregnant. Talk to your  health care professional if you are concerned about your fertility. This medicine has caused decreased sperm counts in some men. This may make it more difficult to father a child. Talk to your health care professional if you are concerned about your fertility. What side effects may I notice from receiving this medication? Side effects that you should report to your doctor or health care professional as soon as possible: allergic reactions like skin rash, itching or hives, swelling of the face, lips, or tongue chest pain diarrhea flushing, runny nose, sweating during infusion low blood counts - this medicine may decrease the number of white blood cells, red blood cells and platelets. You may be at increased risk for infections and bleeding. nausea, vomiting pain, swelling, warmth in the leg signs of decreased platelets or bleeding - bruising, pinpoint red spots on the skin, black, tarry stools, blood in the urine signs of infection - fever or chills, cough, sore throat, pain or difficulty passing urine signs of decreased red blood cells - unusually weak or tired, fainting spells, lightheadedness Side effects that usually do not require medical attention (report to your doctor or health care professional if they continue or are bothersome): constipation hair loss headache loss of appetite mouth sores stomach pain This list may not describe all possible side effects. Call your doctor for medical advice about side effects. You may report side effects to FDA at 1-800-FDA-1088. Where should I keep my medication? This drug is given in a hospital or clinic and will not be stored at home. NOTE: This sheet is a summary. It may not cover all possible information. If you have questions about this medicine, talk to your doctor, pharmacist, or health care provider.  2022 Elsevier/Gold Standard (2021-05-08 00:00:00)  Fluorouracil, 5-FU injection What is this medication? FLUOROURACIL, 5-FU (flure oh  YOOR a sil) is a chemotherapy drug. It slows the growth of cancer cells. This medicine is used to treat many types of cancer like breast cancer, colon or rectal cancer, pancreatic cancer, and stomach cancer. This medicine may be used for other purposes; ask your health care provider or pharmacist if you have questions. COMMON BRAND NAME(S): Adrucil What should I tell my care team before I take this medication?  They need to know if you have any of these conditions: blood disorders dihydropyrimidine dehydrogenase (DPD) deficiency infection (especially a virus infection such as chickenpox, cold sores, or herpes) kidney disease liver disease malnourished, poor nutrition recent or ongoing radiation therapy an unusual or allergic reaction to fluorouracil, other chemotherapy, other medicines, foods, dyes, or preservatives pregnant or trying to get pregnant breast-feeding How should I use this medication? This drug is given as an infusion or injection into a vein. It is administered in a hospital or clinic by a specially trained health care professional. Talk to your pediatrician regarding the use of this medicine in children. Special care may be needed. Overdosage: If you think you have taken too much of this medicine contact a poison control center or emergency room at once. NOTE: This medicine is only for you. Do not share this medicine with others. What if I miss a dose? It is important not to miss your dose. Call your doctor or health care professional if you are unable to keep an appointment. What may interact with this medication? Do not take this medicine with any of the following medications: live virus vaccines This medicine may also interact with the following medications: medicines that treat or prevent blood clots like warfarin, enoxaparin, and dalteparin This list may not describe all possible interactions. Give your health care provider a list of all the medicines, herbs,  non-prescription drugs, or dietary supplements you use. Also tell them if you smoke, drink alcohol, or use illegal drugs. Some items may interact with your medicine. What should I watch for while using this medication? Visit your doctor for checks on your progress. This drug may make you feel generally unwell. This is not uncommon, as chemotherapy can affect healthy cells as well as cancer cells. Report any side effects. Continue your course of treatment even though you feel ill unless your doctor tells you to stop. In some cases, you may be given additional medicines to help with side effects. Follow all directions for their use. Call your doctor or health care professional for advice if you get a fever, chills or sore throat, or other symptoms of a cold or flu. Do not treat yourself. This drug decreases your body's ability to fight infections. Try to avoid being around people who are sick. This medicine may increase your risk to bruise or bleed. Call your doctor or health care professional if you notice any unusual bleeding. Be careful brushing and flossing your teeth or using a toothpick because you may get an infection or bleed more easily. If you have any dental work done, tell your dentist you are receiving this medicine. Avoid taking products that contain aspirin, acetaminophen, ibuprofen, naproxen, or ketoprofen unless instructed by your doctor. These medicines may hide a fever. Do not become pregnant while taking this medicine. Women should inform their doctor if they wish to become pregnant or think they might be pregnant. There is a potential for serious side effects to an unborn child. Talk to your health care professional or pharmacist for more information. Do not breast-feed an infant while taking this medicine. Men should inform their doctor if they wish to father a child. This medicine may lower sperm counts. Do not treat diarrhea with over the counter products. Contact your doctor if you  have diarrhea that lasts more than 2 days or if it is severe and watery. This medicine can make you more sensitive to the sun. Keep out of the sun. If you cannot avoid  being in the sun, wear protective clothing and use sunscreen. Do not use sun lamps or tanning beds/booths. What side effects may I notice from receiving this medication? Side effects that you should report to your doctor or health care professional as soon as possible: allergic reactions like skin rash, itching or hives, swelling of the face, lips, or tongue low blood counts - this medicine may decrease the number of white blood cells, red blood cells and platelets. You may be at increased risk for infections and bleeding. signs of infection - fever or chills, cough, sore throat, pain or difficulty passing urine signs of decreased platelets or bleeding - bruising, pinpoint red spots on the skin, black, tarry stools, blood in the urine signs of decreased red blood cells - unusually weak or tired, fainting spells, lightheadedness breathing problems changes in vision chest pain mouth sores nausea and vomiting pain, swelling, redness at site where injected pain, tingling, numbness in the hands or feet redness, swelling, or sores on hands or feet stomach pain unusual bleeding Side effects that usually do not require medical attention (report to your doctor or health care professional if they continue or are bothersome): changes in finger or toe nails diarrhea dry or itchy skin hair loss headache loss of appetite sensitivity of eyes to the light stomach upset unusually teary eyes This list may not describe all possible side effects. Call your doctor for medical advice about side effects. You may report side effects to FDA at 1-800-FDA-1088. Where should I keep my medication? This drug is given in a hospital or clinic and will not be stored at home. NOTE: This sheet is a summary. It may not cover all possible information. If  you have questions about this medicine, talk to your doctor, pharmacist, or health care provider.  2022 [employer] (2021-05-08 00:00:00)

## 2021-09-05 LAB — CANCER ANTIGEN 19-9: CA 19-9: 23 U/mL (ref 0–35)

## 2021-09-06 ENCOUNTER — Other Ambulatory Visit: Payer: Self-pay

## 2021-09-06 ENCOUNTER — Inpatient Hospital Stay: Payer: Medicare HMO

## 2021-09-06 VITALS — BP 126/75 | HR 62 | Temp 98.6°F | Resp 20

## 2021-09-06 DIAGNOSIS — C250 Malignant neoplasm of head of pancreas: Secondary | ICD-10-CM

## 2021-09-06 DIAGNOSIS — C772 Secondary and unspecified malignant neoplasm of intra-abdominal lymph nodes: Secondary | ICD-10-CM | POA: Diagnosis not present

## 2021-09-06 DIAGNOSIS — Z79899 Other long term (current) drug therapy: Secondary | ICD-10-CM | POA: Diagnosis not present

## 2021-09-06 DIAGNOSIS — I10 Essential (primary) hypertension: Secondary | ICD-10-CM | POA: Diagnosis not present

## 2021-09-06 DIAGNOSIS — E785 Hyperlipidemia, unspecified: Secondary | ICD-10-CM | POA: Diagnosis not present

## 2021-09-06 DIAGNOSIS — Z5111 Encounter for antineoplastic chemotherapy: Secondary | ICD-10-CM | POA: Diagnosis not present

## 2021-09-06 DIAGNOSIS — Z5189 Encounter for other specified aftercare: Secondary | ICD-10-CM | POA: Diagnosis not present

## 2021-09-06 MED ORDER — PEGFILGRASTIM-CBQV 6 MG/0.6ML ~~LOC~~ SOSY
6.0000 mg | PREFILLED_SYRINGE | Freq: Once | SUBCUTANEOUS | Status: AC
  Administered 2021-09-06: 6 mg via SUBCUTANEOUS

## 2021-09-06 MED ORDER — HEPARIN SOD (PORK) LOCK FLUSH 100 UNIT/ML IV SOLN
500.0000 [IU] | Freq: Once | INTRAVENOUS | Status: AC | PRN
  Administered 2021-09-06: 500 [IU]

## 2021-09-06 MED ORDER — SODIUM CHLORIDE 0.9% FLUSH
10.0000 mL | INTRAVENOUS | Status: DC | PRN
  Administered 2021-09-06: 10 mL

## 2021-09-06 NOTE — Patient Instructions (Signed)

## 2021-09-16 ENCOUNTER — Other Ambulatory Visit: Payer: Self-pay | Admitting: Oncology

## 2021-09-17 ENCOUNTER — Encounter: Payer: Self-pay | Admitting: *Deleted

## 2021-09-17 ENCOUNTER — Other Ambulatory Visit: Payer: Self-pay

## 2021-09-17 ENCOUNTER — Inpatient Hospital Stay: Payer: Medicare HMO | Admitting: Oncology

## 2021-09-17 ENCOUNTER — Inpatient Hospital Stay: Payer: Medicare HMO

## 2021-09-17 VITALS — BP 126/80 | HR 66 | Temp 98.1°F | Resp 20 | Ht 66.0 in | Wt 153.6 lb

## 2021-09-17 VITALS — BP 147/88 | HR 64

## 2021-09-17 DIAGNOSIS — E785 Hyperlipidemia, unspecified: Secondary | ICD-10-CM | POA: Diagnosis not present

## 2021-09-17 DIAGNOSIS — C251 Malignant neoplasm of body of pancreas: Secondary | ICD-10-CM

## 2021-09-17 DIAGNOSIS — Z5189 Encounter for other specified aftercare: Secondary | ICD-10-CM | POA: Diagnosis not present

## 2021-09-17 DIAGNOSIS — Z79899 Other long term (current) drug therapy: Secondary | ICD-10-CM | POA: Diagnosis not present

## 2021-09-17 DIAGNOSIS — I10 Essential (primary) hypertension: Secondary | ICD-10-CM | POA: Diagnosis not present

## 2021-09-17 DIAGNOSIS — C772 Secondary and unspecified malignant neoplasm of intra-abdominal lymph nodes: Secondary | ICD-10-CM | POA: Diagnosis not present

## 2021-09-17 DIAGNOSIS — C250 Malignant neoplasm of head of pancreas: Secondary | ICD-10-CM | POA: Diagnosis not present

## 2021-09-17 DIAGNOSIS — Z5111 Encounter for antineoplastic chemotherapy: Secondary | ICD-10-CM | POA: Diagnosis not present

## 2021-09-17 LAB — CMP (CANCER CENTER ONLY)
ALT: 25 U/L (ref 0–44)
AST: 21 U/L (ref 15–41)
Albumin: 4 g/dL (ref 3.5–5.0)
Alkaline Phosphatase: 124 U/L (ref 38–126)
Anion gap: 9 (ref 5–15)
BUN: 14 mg/dL (ref 8–23)
CO2: 23 mmol/L (ref 22–32)
Calcium: 8.9 mg/dL (ref 8.9–10.3)
Chloride: 105 mmol/L (ref 98–111)
Creatinine: 0.9 mg/dL (ref 0.61–1.24)
GFR, Estimated: >60 mL/min (ref >60)
Glucose, Bld: 128 mg/dL — ABNORMAL HIGH (ref 70–99)
Potassium: 3.7 mmol/L (ref 3.5–5.1)
Sodium: 137 mmol/L (ref 135–145)
Total Bilirubin: 0.3 mg/dL (ref 0.3–1.2)
Total Protein: 6.3 g/dL — ABNORMAL LOW (ref 6.5–8.1)

## 2021-09-17 LAB — CBC WITH DIFFERENTIAL (CANCER CENTER ONLY)
Abs Immature Granulocytes: 0.16 10*3/uL — ABNORMAL HIGH (ref 0.00–0.07)
Basophils Absolute: 0 10*3/uL (ref 0.0–0.1)
Basophils Relative: 0 %
Eosinophils Absolute: 0 10*3/uL (ref 0.0–0.5)
Eosinophils Relative: 0 %
HCT: 33.4 % — ABNORMAL LOW (ref 39.0–52.0)
Hemoglobin: 10.9 g/dL — ABNORMAL LOW (ref 13.0–17.0)
Immature Granulocytes: 2 %
Lymphocytes Relative: 20 %
Lymphs Abs: 1.7 10*3/uL (ref 0.7–4.0)
MCH: 30.4 pg (ref 26.0–34.0)
MCHC: 32.6 g/dL (ref 30.0–36.0)
MCV: 93 fL (ref 80.0–100.0)
Monocytes Absolute: 0.8 10*3/uL (ref 0.1–1.0)
Monocytes Relative: 9 %
Neutro Abs: 5.7 10*3/uL (ref 1.7–7.7)
Neutrophils Relative %: 69 %
Platelet Count: 96 10*3/uL — ABNORMAL LOW (ref 150–400)
RBC: 3.59 MIL/uL — ABNORMAL LOW (ref 4.22–5.81)
RDW: 18 % — ABNORMAL HIGH (ref 11.5–15.5)
WBC Count: 8.4 10*3/uL (ref 4.0–10.5)
nRBC: 0 % (ref 0.0–0.2)

## 2021-09-17 LAB — MAGNESIUM: Magnesium: 1.7 mg/dL (ref 1.7–2.4)

## 2021-09-17 MED ORDER — DEXTROSE 5 % IV SOLN: Freq: Once | INTRAVENOUS | Status: AC

## 2021-09-17 MED ORDER — SODIUM CHLORIDE 0.9 % IV SOLN
400.0000 mg/m2 | Freq: Once | INTRAVENOUS | Status: AC
  Administered 2021-09-17: 728 mg via INTRAVENOUS
  Filled 2021-09-17: qty 17.5

## 2021-09-17 MED ORDER — SODIUM CHLORIDE 0.9 % IV SOLN
120.0000 mg/m2 | Freq: Once | INTRAVENOUS | Status: AC
  Administered 2021-09-17: 220 mg via INTRAVENOUS
  Filled 2021-09-17: qty 10

## 2021-09-17 MED ORDER — SODIUM CHLORIDE 0.9 % IV SOLN
150.0000 mg | Freq: Once | INTRAVENOUS | Status: AC
  Administered 2021-09-17: 150 mg via INTRAVENOUS
  Filled 2021-09-17: qty 150

## 2021-09-17 MED ORDER — SODIUM CHLORIDE 0.9 % IV SOLN
400.0000 mg/m2 | Freq: Once | INTRAVENOUS | Status: DC
  Filled 2021-09-17: qty 36.4

## 2021-09-17 MED ORDER — SODIUM CHLORIDE 0.9 % IV SOLN
10.0000 mg | Freq: Once | INTRAVENOUS | Status: AC
  Administered 2021-09-17: 10 mg via INTRAVENOUS
  Filled 2021-09-17: qty 10

## 2021-09-17 MED ORDER — PALONOSETRON HCL INJECTION 0.25 MG/5ML
0.2500 mg | Freq: Once | INTRAVENOUS | Status: AC
  Administered 2021-09-17: 0.25 mg via INTRAVENOUS
  Filled 2021-09-17: qty 5

## 2021-09-17 MED ORDER — SODIUM CHLORIDE 0.9 % IV SOLN
2000.0000 mg/m2 | INTRAVENOUS | Status: DC
  Administered 2021-09-17: 3650 mg via INTRAVENOUS
  Filled 2021-09-17: qty 50

## 2021-09-17 MED ORDER — OXALIPLATIN CHEMO INJECTION 100 MG/20ML
83.0000 mg/m2 | Freq: Once | INTRAVENOUS | Status: AC
  Administered 2021-09-17: 150 mg via INTRAVENOUS
  Filled 2021-09-17: qty 10

## 2021-09-17 MED ORDER — ATROPINE SULFATE 1 MG/ML IV SOLN
0.5000 mg | Freq: Once | INTRAVENOUS | Status: AC | PRN
  Administered 2021-09-17: 0.5 mg via INTRAVENOUS
  Filled 2021-09-17: qty 1

## 2021-09-17 NOTE — Patient Instructions (Addendum)
[name]   The chemotherapy medication bag should finish at 46 hours, 96 hours, or 7 days. For example, if your pump is scheduled for 46 hours and it was put on at 4:00 p.m., it should finish at 2:00 p.m. the day it is scheduled to come off regardless of your appointment time.     Estimated time to finish at 1:45 Wednesday, 09/19/2021.   If the display on your pump reads "Low Volume" and it is beeping, take the batteries out of the pump and come to the cancer center for it to be taken off.   If the pump alarms go off prior to the pump reading "Low Volume" then call [phone] and someone can assist you.  If the plunger comes out and the chemotherapy medication is leaking out, please use your home chemo spill kit to clean up the spill. Do NOT use paper towels or other household products.  If you have problems or questions regarding your pump, please call either [phone] (24 hours a day) or the cancer center Monday-Friday 8:00 a.m.- 4:30 p.m. at the clinic number and we will assist you. If you are unable to get assistance, then go to the nearest Emergency Department and ask the staff to contact the IV team for assistance.  Discharge Instructions: Thank you for choosing [facility] to provide your oncology and hematology care.   If you have a lab appointment with the [facility], please go directly to the [facility] and check in at the registration area.   Wear comfortable clothing and clothing appropriate for easy access to any Portacath or PICC line.   We strive to give you quality time with your provider. You may need to reschedule your appointment if you arrive late (15 or more minutes).  Arriving late affects you and other patients whose appointments are after yours.  Also, if you miss three or more appointments without notifying the office, you may be dismissed from the clinic at the providers discretion.      For prescription refill  requests, have your pharmacy contact our office and allow 72 hours for refills to be completed.    Today you received the following chemotherapy and/or immunotherapy agents Oxaliplatin, leucovorin, irinotecan, fluorouracil      To help prevent nausea and vomiting after your treatment, we encourage you to take your nausea medication as directed.  BELOW ARE SYMPTOMS THAT SHOULD BE REPORTED IMMEDIATELY: *FEVER GREATER THAN 100.4 F (38 C) OR HIGHER *CHILLS OR SWEATING *NAUSEA AND VOMITING THAT IS NOT CONTROLLED WITH YOUR NAUSEA MEDICATION *UNUSUAL SHORTNESS OF BREATH *UNUSUAL BRUISING OR BLEEDING *URINARY PROBLEMS (pain or burning when urinating, or frequent urination) *BOWEL PROBLEMS (unusual diarrhea, constipation, pain near the anus) TENDERNESS IN MOUTH AND THROAT WITH OR WITHOUT PRESENCE OF ULCERS (sore throat, sores in mouth, or a toothache) UNUSUAL RASH, SWELLING OR PAIN  UNUSUAL VAGINAL DISCHARGE OR ITCHING   Items with * indicate a potential emergency and should be followed up as soon as possible or go to the Emergency Department if any problems should occur.  Please show the CHEMOTHERAPY ALERT CARD or IMMUNOTHERAPY ALERT CARD at check-in to the Emergency Department and triage nurse.  Should you have questions after your visit or need to cancel or reschedule your appointment, please contact [facility]  Dept: [phone]  and follow the prompts.  Office hours are 8:00 a.m. to 4:30 p.m. Monday - Friday. Please note that voicemails left  after 4:00 p.m. may not be returned until the following business day.  We are closed weekends and major holidays. You have access to a nurse at all times for urgent questions. Please call the main number to the clinic Dept: [phone] and follow the prompts.   For any non-urgent questions, you may also contact your provider using MyChart. We now offer e-Visits for anyone 93 and older to request care online for non-urgent  symptoms. For details visit [email or web address].   Also download the MyChart app! Go to the app store, search "MyChart", open the app, select [facility], and log in with your MyChart username and password.  Due to Covid, a mask is required upon entering the hospital/clinic. If you do not have a mask, one will be given to you upon arrival. For doctor visits, patients may have 1 support person aged 39 or older with them. For treatment visits, patients cannot have anyone with them due to current Covid guidelines and our immunocompromised population.   Oxaliplatin Injection What is this medication? OXALIPLATIN (ox AL i PLA tin) is a chemotherapy drug. It targets fast dividing cells, like cancer cells, and causes these cells to die. This medicine is used to treat cancers of the colon and rectum, and many other cancers. This medicine may be used for other purposes; ask your health care provider or pharmacist if you have questions. COMMON BRAND NAME(S): Eloxatin What should I tell my care team before I take this medication? They need to know if you have any of these conditions: heart disease history of irregular heartbeat liver disease low blood counts, like white cells, platelets, or red blood cells lung or breathing disease, like asthma take medicines that treat or prevent blood clots tingling of the fingers or toes, or other nerve disorder an unusual or allergic reaction to oxaliplatin, other chemotherapy, other medicines, foods, dyes, or preservatives pregnant or trying to get pregnant breast-feeding How should I use this medication? This drug is given as an infusion into a vein. It is administered in a hospital or clinic by a specially trained health care professional. Talk to your pediatrician regarding the use of this medicine in children. Special care may be needed. Overdosage: If you think you have taken too much of this medicine contact a poison control center or emergency room at  once. NOTE: This medicine is only for you. Do not share this medicine with others. What if I miss a dose? It is important not to miss a dose. Call your doctor or health care professional if you are unable to keep an appointment. What may interact with this medication? Do not take this medicine with any of the following medications: cisapride dronedarone pimozide thioridazine This medicine may also interact with the following medications: aspirin and aspirin-like medicines certain medicines that treat or prevent blood clots like warfarin, apixaban, dabigatran, and rivaroxaban cisplatin cyclosporine diuretics medicines for infection like acyclovir, adefovir, amphotericin B, bacitracin, cidofovir, foscarnet, ganciclovir, gentamicin, pentamidine, vancomycin NSAIDs, medicines for pain and inflammation, like ibuprofen or naproxen other medicines that prolong the QT interval (an abnormal heart rhythm) pamidronate zoledronic acid This list may not describe all possible interactions. Give your health care provider a list of all the medicines, herbs, non-prescription drugs, or dietary supplements you use. Also tell them if you smoke, drink alcohol, or use illegal drugs. Some items may interact with your medicine. What should I watch for while using this medication? Your condition will be monitored carefully while you are receiving this medicine.  You may need blood work done while you are taking this medicine. This medicine may make you feel generally unwell. This is not uncommon as chemotherapy can affect healthy cells as well as cancer cells. Report any side effects. Continue your course of treatment even though you feel ill unless your healthcare professional tells you to stop. This medicine can make you more sensitive to cold. Do not drink cold drinks or use ice. Cover exposed skin before coming in contact with cold temperatures or cold objects. When out in cold weather wear warm clothing and  cover your mouth and nose to warm the air that goes into your lungs. Tell your doctor if you get sensitive to the cold. Do not become pregnant while taking this medicine or for 9 months after stopping it. Women should inform their health care professional if they wish to become pregnant or think they might be pregnant. Men should not father a child while taking this medicine and for 6 months after stopping it. There is potential for serious side effects to an unborn child. Talk to your health care professional for more information. Do not breast-feed a child while taking this medicine or for 3 months after stopping it. This medicine has caused ovarian failure in some women. This medicine may make it more difficult to get pregnant. Talk to your health care professional if you are concerned about your fertility. This medicine has caused decreased sperm counts in some men. This may make it more difficult to father a child. Talk to your health care professional if you are concerned about your fertility. This medicine may increase your risk of getting an infection. Call your health care professional for advice if you get a fever, chills, or sore throat, or other symptoms of a cold or flu. Do not treat yourself. Try to avoid being around people who are sick. Avoid taking medicines that contain aspirin, acetaminophen, ibuprofen, naproxen, or ketoprofen unless instructed by your health care professional. These medicines may hide a fever. Be careful brushing or flossing your teeth or using a toothpick because you may get an infection or bleed more easily. If you have any dental work done, tell your dentist you are receiving this medicine. What side effects may I notice from receiving this medication? Side effects that you should report to your doctor or health care professional as soon as possible: allergic reactions like skin rash, itching or hives, swelling of the face, lips, or tongue breathing  problems cough low blood counts - this medicine may decrease the number of white blood cells, red blood cells, and platelets. You may be at increased risk for infections and bleeding nausea, vomiting pain, redness, or irritation at site where injected pain, tingling, numbness in the hands or feet signs and symptoms of bleeding such as bloody or black, tarry stools; red or dark brown urine; spitting up blood or brown material that looks like coffee grounds; red spots on the skin; unusual bruising or bleeding from the eyes, gums, or nose signs and symptoms of a dangerous change in heartbeat or heart rhythm like chest pain; dizziness; fast, irregular heartbeat; palpitations; feeling faint or lightheaded; falls signs and symptoms of infection like fever; chills; cough; sore throat; pain or trouble passing urine signs and symptoms of liver injury like dark yellow or brown urine; general ill feeling or flu-like symptoms; light-colored stools; loss of appetite; nausea; right upper belly pain; unusually weak or tired; yellowing of the eyes or skin signs and symptoms of low  red blood cells or anemia such as unusually weak or tired; feeling faint or lightheaded; falls signs and symptoms of muscle injury like dark urine; trouble passing urine or change in the amount of urine; unusually weak or tired; muscle pain; back pain Side effects that usually do not require medical attention (report to your doctor or health care professional if they continue or are bothersome): changes in taste diarrhea gas hair loss loss of appetite mouth sores This list may not describe all possible side effects. Call your doctor for medical advice about side effects. You may report side effects to FDA at 1-800-FDA-1088. Where should I keep my medication? This drug is given in a hospital or clinic and will not be stored at home. NOTE: This sheet is a summary. It may not cover all possible information. If you have questions about  this medicine, talk to your doctor, pharmacist, or health care provider.  2022 Elsevier/Gold Standard (2021-05-08 00:00:00)  Leucovorin injection What is this medication? LEUCOVORIN (loo koe VOR in) is used to prevent or treat the harmful effects of some medicines. This medicine is used to treat anemia caused by a low amount of folic acid in the body. It is also used with 5-fluorouracil (5-FU) to treat colon cancer. This medicine may be used for other purposes; ask your health care provider or pharmacist if you have questions. What should I tell my care team before I take this medication? They need to know if you have any of these conditions: anemia from low levels of vitamin B-12 in the blood an unusual or allergic reaction to leucovorin, folic acid, other medicines, foods, dyes, or preservatives pregnant or trying to get pregnant breast-feeding How should I use this medication? This medicine is for injection into a muscle or into a vein. It is given by a health care professional in a hospital or clinic setting. Talk to your pediatrician regarding the use of this medicine in children. Special care may be needed. Overdosage: If you think you have taken too much of this medicine contact a poison control center or emergency room at once. NOTE: This medicine is only for you. Do not share this medicine with others. What if I miss a dose? This does not apply. What may interact with this medication? capecitabine fluorouracil phenobarbital phenytoin primidone trimethoprim-sulfamethoxazole This list may not describe all possible interactions. Give your health care provider a list of all the medicines, herbs, non-prescription drugs, or dietary supplements you use. Also tell them if you smoke, drink alcohol, or use illegal drugs. Some items may interact with your medicine. What should I watch for while using this medication? Your condition will be monitored carefully while you are receiving this  medicine. This medicine may increase the side effects of 5-fluorouracil, 5-FU. Tell your doctor or health care professional if you have diarrhea or mouth sores that do not get better or that get worse. What side effects may I notice from receiving this medication? Side effects that you should report to your doctor or health care professional as soon as possible: allergic reactions like skin rash, itching or hives, swelling of the face, lips, or tongue breathing problems fever, infection mouth sores unusual bleeding or bruising unusually weak or tired Side effects that usually do not require medical attention (report to your doctor or health care professional if they continue or are bothersome): constipation or diarrhea loss of appetite nausea, vomiting This list may not describe all possible side effects. Call your doctor for medical advice  about side effects. You may report side effects to FDA at 1-800-FDA-1088. Where should I keep my medication? This drug is given in a hospital or clinic and will not be stored at home. NOTE: This sheet is a summary. It may not cover all possible information. If you have questions about this medicine, talk to your doctor, pharmacist, or health care provider.  2022 Elsevier/Gold Standard (2008-02-25 00:00:00)  Irinotecan injection What is this medication? IRINOTECAN (ir in oh TEE kan ) is a chemotherapy drug. It is used to treat colon and rectal cancer. This medicine may be used for other purposes; ask your health care provider or pharmacist if you have questions. COMMON BRAND NAME(S): Camptosar What should I tell my care team before I take this medication? They need to know if you have any of these conditions: dehydration diarrhea infection (especially a virus infection such as chickenpox, cold sores, or herpes) liver disease low blood counts, like low white cell, platelet, or red cell counts low levels of calcium, magnesium, or potassium in the  blood recent or ongoing radiation therapy an unusual or allergic reaction to irinotecan, other medicines, foods, dyes, or preservatives pregnant or trying to get pregnant breast-feeding How should I use this medication? This drug is given as an infusion into a vein. It is administered in a hospital or clinic by a specially trained health care professional. Talk to your pediatrician regarding the use of this medicine in children. Special care may be needed. Overdosage: If you think you have taken too much of this medicine contact a poison control center or emergency room at once. NOTE: This medicine is only for you. Do not share this medicine with others. What if I miss a dose? It is important not to miss your dose. Call your doctor or health care professional if you are unable to keep an appointment. What may interact with this medication? Do not take this medicine with any of the following medications: cobicistat itraconazole This medicine may interact with the following medications: antiviral medicines for HIV or AIDS certain antibiotics like rifampin or rifabutin certain medicines for fungal infections like ketoconazole, posaconazole, and voriconazole certain medicines for seizures like carbamazepine, phenobarbital, phenotoin clarithromycin gemfibrozil nefazodone [place]'s Wort This list may not describe all possible interactions. Give your health care provider a list of all the medicines, herbs, non-prescription drugs, or dietary supplements you use. Also tell them if you smoke, drink alcohol, or use illegal drugs. Some items may interact with your medicine. What should I watch for while using this medication? Your condition will be monitored carefully while you are receiving this medicine. You will need important blood work done while you are taking this medicine. This drug may make you feel generally unwell. This is not uncommon, as chemotherapy can affect healthy cells as well as  cancer cells. Report any side effects. Continue your course of treatment even though you feel ill unless your doctor tells you to stop. In some cases, you may be given additional medicines to help with side effects. Follow all directions for their use. You may get drowsy or dizzy. Do not drive, use machinery, or do anything that needs mental alertness until you know how this medicine affects you. Do not stand or sit up quickly, especially if you are an older patient. This reduces the risk of dizzy or fainting spells. Call your health care professional for advice if you get a fever, chills, or sore throat, or other symptoms of a cold or flu.  Do not treat yourself. This medicine decreases your body's ability to fight infections. Try to avoid being around people who are sick. Avoid taking products that contain aspirin, acetaminophen, ibuprofen, naproxen, or ketoprofen unless instructed by your doctor. These medicines may hide a fever. This medicine may increase your risk to bruise or bleed. Call your doctor or health care professional if you notice any unusual bleeding. Be careful brushing and flossing your teeth or using a toothpick because you may get an infection or bleed more easily. If you have any dental work done, tell your dentist you are receiving this medicine. Do not become pregnant while taking this medicine or for 6 months after stopping it. Women should inform their health care professional if they wish to become pregnant or think they might be pregnant. Men should not father a child while taking this medicine and for 3 months after stopping it. There is potential for serious side effects to an unborn child. Talk to your health care professional for more information. Do not breast-feed an infant while taking this medicine or for 7 days after stopping it. This medicine has caused ovarian failure in some women. This medicine may make it more difficult to get pregnant. Talk to your health care  professional if you are concerned about your fertility. This medicine has caused decreased sperm counts in some men. This may make it more difficult to father a child. Talk to your health care professional if you are concerned about your fertility. What side effects may I notice from receiving this medication? Side effects that you should report to your doctor or health care professional as soon as possible: allergic reactions like skin rash, itching or hives, swelling of the face, lips, or tongue chest pain diarrhea flushing, runny nose, sweating during infusion low blood counts - this medicine may decrease the number of white blood cells, red blood cells and platelets. You may be at increased risk for infections and bleeding. nausea, vomiting pain, swelling, warmth in the leg signs of decreased platelets or bleeding - bruising, pinpoint red spots on the skin, black, tarry stools, blood in the urine signs of infection - fever or chills, cough, sore throat, pain or difficulty passing urine signs of decreased red blood cells - unusually weak or tired, fainting spells, lightheadedness Side effects that usually do not require medical attention (report to your doctor or health care professional if they continue or are bothersome): constipation hair loss headache loss of appetite mouth sores stomach pain This list may not describe all possible side effects. Call your doctor for medical advice about side effects. You may report side effects to FDA at 1-800-FDA-1088. Where should I keep my medication? This drug is given in a hospital or clinic and will not be stored at home. NOTE: This sheet is a summary. It may not cover all possible information. If you have questions about this medicine, talk to your doctor, pharmacist, or health care provider.  2022 Elsevier/Gold Standard (2021-05-08 00:00:00)  Fluorouracil, 5-FU injection What is this medication? FLUOROURACIL, 5-FU (flure oh YOOR a sil) is  a chemotherapy drug. It slows the growth of cancer cells. This medicine is used to treat many types of cancer like breast cancer, colon or rectal cancer, pancreatic cancer, and stomach cancer. This medicine may be used for other purposes; ask your health care provider or pharmacist if you have questions. COMMON BRAND NAME(S): Adrucil What should I tell my care team before I take this medication? They need to know  if you have any of these conditions: blood disorders dihydropyrimidine dehydrogenase (DPD) deficiency infection (especially a virus infection such as chickenpox, cold sores, or herpes) kidney disease liver disease malnourished, poor nutrition recent or ongoing radiation therapy an unusual or allergic reaction to fluorouracil, other chemotherapy, other medicines, foods, dyes, or preservatives pregnant or trying to get pregnant breast-feeding How should I use this medication? This drug is given as an infusion or injection into a vein. It is administered in a hospital or clinic by a specially trained health care professional. Talk to your pediatrician regarding the use of this medicine in children. Special care may be needed. Overdosage: If you think you have taken too much of this medicine contact a poison control center or emergency room at once. NOTE: This medicine is only for you. Do not share this medicine with others. What if I miss a dose? It is important not to miss your dose. Call your doctor or health care professional if you are unable to keep an appointment. What may interact with this medication? Do not take this medicine with any of the following medications: live virus vaccines This medicine may also interact with the following medications: medicines that treat or prevent blood clots like warfarin, enoxaparin, and dalteparin This list may not describe all possible interactions. Give your health care provider a list of all the medicines, herbs, non-prescription drugs,  or dietary supplements you use. Also tell them if you smoke, drink alcohol, or use illegal drugs. Some items may interact with your medicine. What should I watch for while using this medication? Visit your doctor for checks on your progress. This drug may make you feel generally unwell. This is not uncommon, as chemotherapy can affect healthy cells as well as cancer cells. Report any side effects. Continue your course of treatment even though you feel ill unless your doctor tells you to stop. In some cases, you may be given additional medicines to help with side effects. Follow all directions for their use. Call your doctor or health care professional for advice if you get a fever, chills or sore throat, or other symptoms of a cold or flu. Do not treat yourself. This drug decreases your body's ability to fight infections. Try to avoid being around people who are sick. This medicine may increase your risk to bruise or bleed. Call your doctor or health care professional if you notice any unusual bleeding. Be careful brushing and flossing your teeth or using a toothpick because you may get an infection or bleed more easily. If you have any dental work done, tell your dentist you are receiving this medicine. Avoid taking products that contain aspirin, acetaminophen, ibuprofen, naproxen, or ketoprofen unless instructed by your doctor. These medicines may hide a fever. Do not become pregnant while taking this medicine. Women should inform their doctor if they wish to become pregnant or think they might be pregnant. There is a potential for serious side effects to an unborn child. Talk to your health care professional or pharmacist for more information. Do not breast-feed an infant while taking this medicine. Men should inform their doctor if they wish to father a child. This medicine may lower sperm counts. Do not treat diarrhea with over the counter products. Contact your doctor if you have diarrhea that lasts  more than 2 days or if it is severe and watery. This medicine can make you more sensitive to the sun. Keep out of the sun. If you cannot avoid being in the sun,  wear protective clothing and use sunscreen. Do not use sun lamps or tanning beds/booths. What side effects may I notice from receiving this medication? Side effects that you should report to your doctor or health care professional as soon as possible: allergic reactions like skin rash, itching or hives, swelling of the face, lips, or tongue low blood counts - this medicine may decrease the number of white blood cells, red blood cells and platelets. You may be at increased risk for infections and bleeding. signs of infection - fever or chills, cough, sore throat, pain or difficulty passing urine signs of decreased platelets or bleeding - bruising, pinpoint red spots on the skin, black, tarry stools, blood in the urine signs of decreased red blood cells - unusually weak or tired, fainting spells, lightheadedness breathing problems changes in vision chest pain mouth sores nausea and vomiting pain, swelling, redness at site where injected pain, tingling, numbness in the hands or feet redness, swelling, or sores on hands or feet stomach pain unusual bleeding Side effects that usually do not require medical attention (report to your doctor or health care professional if they continue or are bothersome): changes in finger or toe nails diarrhea dry or itchy skin hair loss headache loss of appetite sensitivity of eyes to the light stomach upset unusually teary eyes This list may not describe all possible side effects. Call your doctor for medical advice about side effects. You may report side effects to FDA at 1-800-FDA-1088. Where should I keep my medication? This drug is given in a hospital or clinic and will not be stored at home. NOTE: This sheet is a summary. It may not cover all possible information. If you have questions about  this medicine, talk to your doctor, pharmacist, or health care provider.  2022 [employer] Standard (2021-05-08 00:00:00)

## 2021-09-17 NOTE — Progress Notes (Signed)
Patient seen by Dr. Benay Spice today  Vitals are within treatment parameters.  Labs reviewed by Dr. Benay Spice and are not all within treatment parameters. OK to treat w/platelet count 96,000.  Per physician team, patient is ready for treatment and there are NO modifications to the treatment plan.

## 2021-09-17 NOTE — Progress Notes (Signed)
[place] OFFICE PROGRESS NOTE   Diagnosis: Pancreas cancer  INTERVAL HISTORY:   David Jarvis completed another cycle FOLFIRINOX on 09/04/2021.  No nausea/vomiting.  He reports mild diarrhea.  Mild nosebleeding.  He has cold sensitivity for a few days following chemotherapy.  No neuropathy symptoms at present.  No fever.  Objective:  Vital signs in last 24 hours:  Blood pressure 126/80, pulse 66, temperature 98.1 F (36.7 C), temperature source Oral, resp. rate 20, height 5\' 6"  (1.676 m), weight 153 lb 9.6 oz (69.7 kg), SpO2 100 %.    HEENT: No thrush or ulcers Resp: Lungs clear bilaterally Cardio: Regular rate and rhythm GI: Nontender, no mass, no hepatosplenomegaly Vascular: No leg edema Neuro: Mild loss of vibratory sense at the fingertips bilaterally    Portacath/PICC-without erythema  Lab Results:  Lab Results  Component Value Date   WBC 8.4 09/17/2021   HGB 10.9 (L) 09/17/2021   HCT 33.4 (L) 09/17/2021   MCV 93.0 09/17/2021   PLT 96 (L) 09/17/2021   NEUTROABS 5.7 09/17/2021    CMP  Lab Results  Component Value Date   NA 137 09/17/2021   K 3.7 09/17/2021   CL 105 09/17/2021   CO2 23 09/17/2021   GLUCOSE 128 (H) 09/17/2021   BUN 14 09/17/2021   CREATININE 0.90 09/17/2021   CALCIUM 8.9 09/17/2021   PROT 6.3 (L) 09/17/2021   ALBUMIN 4.0 09/17/2021   AST 21 09/17/2021   ALT 25 09/17/2021   ALKPHOS 124 09/17/2021   BILITOT 0.3 09/17/2021   GFRNONAA >60 09/17/2021    Lab Results  Component Value Date   VVO160 23 09/04/2021    Medications: I have reviewed the patient's current medications.   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, stent placed EUS 05/23/2021-25 x 15 mm pancreas head mass with irregular borders,  no lymphadenopathy, no vascular involvement, T2 N0 by ultrasound CT chest 06/01/2021-negative for metastatic disease CT abdomen pancreas protocol 06/01/2021-3.2 x 2.3 cm hypoenhancing pancreas head mass inseparable from the third portion of the duodenum, 1 cm left retroperitoneal lymph node, no vascular involvement Cycle 1 FOLFIRINOX 06/19/2021 Cycle 2 FOLFIRINOX 07/02/2021, irinotecan and 5-FU dose reduced, prophylactic dexamethasone added beginning day of pump discontinuation Cycle 3 FOLFIRINOX 07/23/2021, Udenyca CT abdomen/pelvis 08/01/2021-done in the emergency department to evaluate epigastric pain-pancreas head mass decreased in size.  Diffuse gaseous distention of large and small bowel.  No bowel obstruction or focal inflammation. Cycle 4 FOLFIRINOX 08/06/2021, Udenyca Cycle 5 FOLFIRINOX 08/20/2021, Udenyca Cycle 6 FOLFIRINOX 09/04/2021, Udenyca Cycle 7 FOLFIRINOX 09/17/2021, Udenyca Obstructive jaundice secondary #1 Hypertension Admission in February 2022 with elevated liver enzymes MRI/MRCP-intra and extrahepatic biliary duct dilation, tapering smoothly to the ampulla, no mass or filling defect ERCP 10/06/2020-dilated main bile duct, biliary tree was swept and nothing was found, cytology from bile duct brushing-no malignant cells identified 5.  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  GERD 7.  Squamous cell carcinoma left forearm 2014 8.  Hyperlipidemia 9.  CT chest 08/01/2021, done in the emergency department to evaluate pleuritic chest pain-no PE.  Advanced three-vessel coronary vascular calcification. 10.  Fever of unknown origin beginning 08/14/2021-blood and urine cultures negative, no source for infection identified, placed on antibiotic prophylaxis beginning 08/17/2021    Disposition: David. [name] appears stable.  He will complete cycle 7 FOLFIRINOX today.  He will return for an office visit and the final cycle of chemotherapy in 2 weeks.  He is scheduled for restaging CT evaluation  at [facility] on 10/16/2021. He has mild thrombocytopenia.  He will call for bleeding. Betsy Coder, MD  09/17/2021  10:14 AM

## 2021-09-17 NOTE — Progress Notes (Signed)
Patient presents for treatment. RN assessment completed along with the following:  Labs/vitals reviewed - Yes, and ok to treat with platelets of 96 per Dr Benay Spice    Weight within 10% of previous measurement - Yes Oncology Treatment Attestation completed for current therapy- Yes, on date 05/25/21 Informed consent completed and reflects current therapy/intent - Yes, on date 06/19/21             Provider progress note reviewed - Yes, today's provider note was reviewed. Treatment/Antibody/Supportive plan reviewed - Yes, and there are no adjustments needed for today's treatment. S&H and other orders reviewed - Yes, and there are no additional orders identified. Previous treatment date reviewed - Yes, and the appropriate amount of time has elapsed between treatments. Clinic Hand Off Received from - Cristy Friedlander, RN  Patient to proceed with treatment.

## 2021-09-18 ENCOUNTER — Other Ambulatory Visit (HOSPITAL_BASED_OUTPATIENT_CLINIC_OR_DEPARTMENT_OTHER): Payer: Self-pay

## 2021-09-18 ENCOUNTER — Other Ambulatory Visit: Payer: Self-pay

## 2021-09-18 DIAGNOSIS — C250 Malignant neoplasm of head of pancreas: Secondary | ICD-10-CM

## 2021-09-18 MED ORDER — POTASSIUM CHLORIDE CRYS ER 20 MEQ PO TBCR
EXTENDED_RELEASE_TABLET | ORAL | 1 refills | Status: DC
  Filled 2021-09-18: qty 36, 33d supply, fill #0
  Filled 2021-10-31: qty 36, 33d supply, fill #1

## 2021-09-18 MED ORDER — MAGNESIUM OXIDE -MG SUPPLEMENT 400 (240 MG) MG PO TABS
400.0000 mg | ORAL_TABLET | Freq: Every day | ORAL | 0 refills | Status: DC
  Filled 2021-09-18: qty 120, 120d supply, fill #0

## 2021-09-19 ENCOUNTER — Inpatient Hospital Stay: Payer: Medicare HMO

## 2021-09-19 ENCOUNTER — Other Ambulatory Visit: Payer: Self-pay

## 2021-09-19 VITALS — BP 119/84 | HR 64 | Temp 97.6°F | Resp 20

## 2021-09-19 DIAGNOSIS — C250 Malignant neoplasm of head of pancreas: Secondary | ICD-10-CM

## 2021-09-19 DIAGNOSIS — Z5189 Encounter for other specified aftercare: Secondary | ICD-10-CM | POA: Diagnosis not present

## 2021-09-19 DIAGNOSIS — Z79899 Other long term (current) drug therapy: Secondary | ICD-10-CM | POA: Diagnosis not present

## 2021-09-19 DIAGNOSIS — Z5111 Encounter for antineoplastic chemotherapy: Secondary | ICD-10-CM | POA: Diagnosis not present

## 2021-09-19 DIAGNOSIS — I10 Essential (primary) hypertension: Secondary | ICD-10-CM | POA: Diagnosis not present

## 2021-09-19 DIAGNOSIS — E785 Hyperlipidemia, unspecified: Secondary | ICD-10-CM | POA: Diagnosis not present

## 2021-09-19 DIAGNOSIS — C772 Secondary and unspecified malignant neoplasm of intra-abdominal lymph nodes: Secondary | ICD-10-CM | POA: Diagnosis not present

## 2021-09-19 MED ORDER — HEPARIN SOD (PORK) LOCK FLUSH 100 UNIT/ML IV SOLN
500.0000 [IU] | Freq: Once | INTRAVENOUS | Status: AC | PRN
  Administered 2021-09-19: 500 [IU]

## 2021-09-19 MED ORDER — SODIUM CHLORIDE 0.9% FLUSH
10.0000 mL | INTRAVENOUS | Status: DC | PRN
  Administered 2021-09-19: 10 mL

## 2021-09-19 MED ORDER — PEGFILGRASTIM-CBQV 6 MG/0.6ML ~~LOC~~ SOSY
6.0000 mg | PREFILLED_SYRINGE | Freq: Once | SUBCUTANEOUS | Status: AC
  Administered 2021-09-19: 6 mg via SUBCUTANEOUS
  Filled 2021-09-19: qty 0.6

## 2021-09-19 NOTE — Patient Instructions (Signed)

## 2021-09-30 ENCOUNTER — Other Ambulatory Visit: Payer: Self-pay | Admitting: Oncology

## 2021-10-01 ENCOUNTER — Encounter: Payer: Self-pay | Admitting: *Deleted

## 2021-10-01 ENCOUNTER — Encounter: Payer: Self-pay | Admitting: Nurse Practitioner

## 2021-10-01 ENCOUNTER — Inpatient Hospital Stay: Payer: Medicare HMO

## 2021-10-01 ENCOUNTER — Inpatient Hospital Stay: Payer: Medicare HMO | Admitting: Nurse Practitioner

## 2021-10-01 ENCOUNTER — Other Ambulatory Visit: Payer: Self-pay

## 2021-10-01 VITALS — BP 144/89 | HR 64

## 2021-10-01 VITALS — BP 139/87 | HR 74 | Temp 97.7°F | Resp 18 | Ht 66.0 in | Wt 155.0 lb

## 2021-10-01 DIAGNOSIS — Z79899 Other long term (current) drug therapy: Secondary | ICD-10-CM | POA: Diagnosis not present

## 2021-10-01 DIAGNOSIS — C250 Malignant neoplasm of head of pancreas: Secondary | ICD-10-CM

## 2021-10-01 DIAGNOSIS — Z5189 Encounter for other specified aftercare: Secondary | ICD-10-CM | POA: Diagnosis not present

## 2021-10-01 DIAGNOSIS — Z5111 Encounter for antineoplastic chemotherapy: Secondary | ICD-10-CM | POA: Diagnosis not present

## 2021-10-01 DIAGNOSIS — E785 Hyperlipidemia, unspecified: Secondary | ICD-10-CM | POA: Diagnosis not present

## 2021-10-01 DIAGNOSIS — I10 Essential (primary) hypertension: Secondary | ICD-10-CM | POA: Diagnosis not present

## 2021-10-01 DIAGNOSIS — C772 Secondary and unspecified malignant neoplasm of intra-abdominal lymph nodes: Secondary | ICD-10-CM | POA: Diagnosis not present

## 2021-10-01 LAB — CBC WITH DIFFERENTIAL (CANCER CENTER ONLY)
Abs Immature Granulocytes: 0.4 10*3/uL — ABNORMAL HIGH (ref 0.00–0.07)
Band Neutrophils: 3 %
Basophils Absolute: 0 10*3/uL (ref 0.0–0.1)
Basophils Relative: 0 %
Eosinophils Absolute: 0 10*3/uL (ref 0.0–0.5)
Eosinophils Relative: 0 %
HCT: 32.2 % — ABNORMAL LOW (ref 39.0–52.0)
Hemoglobin: 10.6 g/dL — ABNORMAL LOW (ref 13.0–17.0)
Lymphocytes Relative: 12 %
Lymphs Abs: 1.3 10*3/uL (ref 0.7–4.0)
MCH: 31.3 pg (ref 26.0–34.0)
MCHC: 32.9 g/dL (ref 30.0–36.0)
MCV: 95 fL (ref 80.0–100.0)
Metamyelocytes Relative: 4 %
Monocytes Absolute: 0.6 10*3/uL (ref 0.1–1.0)
Monocytes Relative: 6 %
Neutro Abs: 8.2 10*3/uL — ABNORMAL HIGH (ref 1.7–7.7)
Neutrophils Relative %: 75 %
Platelet Count: 84 10*3/uL — ABNORMAL LOW (ref 150–400)
RBC: 3.39 MIL/uL — ABNORMAL LOW (ref 4.22–5.81)
RDW: 17.9 % — ABNORMAL HIGH (ref 11.5–15.5)
WBC Count: 10.5 10*3/uL (ref 4.0–10.5)
nRBC: 0.3 % — ABNORMAL HIGH (ref 0.0–0.2)

## 2021-10-01 LAB — CMP (CANCER CENTER ONLY)
ALT: 33 U/L (ref 0–44)
AST: 31 U/L (ref 15–41)
Albumin: 3.9 g/dL (ref 3.5–5.0)
Alkaline Phosphatase: 151 U/L — ABNORMAL HIGH (ref 38–126)
Anion gap: 9 (ref 5–15)
BUN: 12 mg/dL (ref 8–23)
CO2: 26 mmol/L (ref 22–32)
Calcium: 8.8 mg/dL — ABNORMAL LOW (ref 8.9–10.3)
Chloride: 105 mmol/L (ref 98–111)
Creatinine: 0.99 mg/dL (ref 0.61–1.24)
GFR, Estimated: >60 mL/min (ref >60)
Glucose, Bld: 120 mg/dL — ABNORMAL HIGH (ref 70–99)
Potassium: 4 mmol/L (ref 3.5–5.1)
Sodium: 140 mmol/L (ref 135–145)
Total Bilirubin: 0.3 mg/dL (ref 0.3–1.2)
Total Protein: 6.2 g/dL — ABNORMAL LOW (ref 6.5–8.1)

## 2021-10-01 LAB — MAGNESIUM: Magnesium: 1.8 mg/dL (ref 1.7–2.4)

## 2021-10-01 MED ORDER — OXALIPLATIN CHEMO INJECTION 100 MG/20ML
150.0000 mg | Freq: Once | INTRAVENOUS | Status: AC
  Administered 2021-10-01: 150 mg via INTRAVENOUS
  Filled 2021-10-01: qty 30

## 2021-10-01 MED ORDER — SODIUM CHLORIDE 0.9 % IV SOLN
2000.0000 mg/m2 | INTRAVENOUS | Status: DC
  Administered 2021-10-01: 3650 mg via INTRAVENOUS
  Filled 2021-10-01: qty 73

## 2021-10-01 MED ORDER — SODIUM CHLORIDE 0.9 % IV SOLN
10.0000 mg | Freq: Once | INTRAVENOUS | Status: AC
  Administered 2021-10-01: 10 mg via INTRAVENOUS
  Filled 2021-10-01: qty 1

## 2021-10-01 MED ORDER — ATROPINE SULFATE 1 MG/ML IV SOLN
0.5000 mg | Freq: Once | INTRAVENOUS | Status: AC | PRN
  Administered 2021-10-01: 0.5 mg via INTRAVENOUS
  Filled 2021-10-01: qty 1

## 2021-10-01 MED ORDER — SODIUM CHLORIDE 0.9 % IV SOLN
150.0000 mg | Freq: Once | INTRAVENOUS | Status: AC
  Administered 2021-10-01: 150 mg via INTRAVENOUS
  Filled 2021-10-01: qty 5

## 2021-10-01 MED ORDER — SODIUM CHLORIDE 0.9 % IV SOLN
400.0000 mg/m2 | Freq: Once | INTRAVENOUS | Status: AC
  Administered 2021-10-01: 728 mg via INTRAVENOUS
  Filled 2021-10-01: qty 36.4

## 2021-10-01 MED ORDER — PALONOSETRON HCL INJECTION 0.25 MG/5ML
0.2500 mg | Freq: Once | INTRAVENOUS | Status: AC
  Administered 2021-10-01: 0.25 mg via INTRAVENOUS
  Filled 2021-10-01: qty 5

## 2021-10-01 MED ORDER — SODIUM CHLORIDE 0.9 % IV SOLN
120.0000 mg/m2 | Freq: Once | INTRAVENOUS | Status: AC
  Administered 2021-10-01: 220 mg via INTRAVENOUS
  Filled 2021-10-01: qty 2

## 2021-10-01 MED ORDER — DEXTROSE 5 % IV SOLN: Freq: Once | INTRAVENOUS | Status: AC

## 2021-10-01 NOTE — Progress Notes (Signed)
Patient seen by Ned Card NP today  Vitals are within treatment parameters.  Labs reviewed by Ned Card NP and are not all within treatment parameters. Platelet count 84,000. OK to treat--this is his last neoadjuvant treatment  Per physician team, patient is ready for treatment and there are NO modifications to the treatment plan.

## 2021-10-01 NOTE — Progress Notes (Signed)
Patient presents for treatment. RN assessment completed along with the following:  Labs/vitals reviewed - OK to treat with plts of 84. Weight within 10% of previous measurement - Yes Informed consent completed and reflects current therapy/intent - Yes, on date 06/19/21             Provider progress note reviewed - Yes, today's provider note was reviewed. Treatment/Antibody/Supportive plan reviewed - Yes, and there are no adjustments needed for today's treatment. S&H and other orders reviewed - Yes, and there are no additional orders identified. Previous treatment date reviewed - Yes, and the appropriate amount of time has elapsed between treatments. Clinic Hand Off Received from - Cristy Friedlander, RN  Patient to proceed with treatment.

## 2021-10-01 NOTE — Progress Notes (Signed)
PATIENT NAVIGATOR PROGRESS NOTE  Name: David Jarvis Date: 10/01/2021 MRN: [id number]  DOB: [birth year]   Reason for visit:  Final Neoadjuvant Chemo treatment  Comments:  Met with patient during final neoadjuvant chemo, he will have restaging CT at [facility] on 10/16/21 for evaluation for surgery.  He will see Dr Benay Spice after scans completed.  Encouraged to call with any issues or questions.    Time spent counseling/coordinating care: 30-45 minutes

## 2021-10-01 NOTE — Progress Notes (Signed)
[place] [place] OFFICE PROGRESS NOTE   Diagnosis: Pancreas cancer  INTERVAL HISTORY:   David Jarvis returns as scheduled.  He completed cycle 7 FOLFIRINOX 09/17/2021.  He denies nausea/vomiting.  No mouth sores.  He had a few loose stools, resolved with Imodium.  Cold sensitivity lasted approximately 1 week.  No persistent neuropathy symptoms.  Objective:  Vital signs in last 24 hours:  Blood pressure 139/87, pulse 74, temperature 97.7 F (36.5 C), temperature source Oral, resp. rate 18, height 5\' 6"  (1.676 m), weight 155 lb (70.3 kg), SpO2 98 %.    HEENT: No thrush or ulcers. Resp: Lungs clear bilaterally. Cardio: Regular rate and rhythm. GI: Abdomen soft and nontender.  No hepatosplenomegaly. Vascular: No leg edema. Neuro: Vibratory sense mildly decreased over the fingertips per tuning fork exam. Skin: Palms without erythema. Port-A-Cath without erythema.   Lab Results:  Lab Results  Component Value Date   WBC 10.5 10/01/2021   HGB 10.6 (L) 10/01/2021   HCT 32.2 (L) 10/01/2021   MCV 95.0 10/01/2021   PLT 84 (L) 10/01/2021   NEUTROABS PENDING 10/01/2021    Imaging:  No results found.  Medications: I have reviewed the patient's current medications.  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, stent placed EUS 05/23/2021-25 x 15 mm pancreas head mass with irregular borders, no lymphadenopathy, no vascular involvement, T2 N0 by ultrasound CT chest 06/01/2021-negative for metastatic disease CT abdomen pancreas protocol 06/01/2021-3.2 x 2.3 cm hypoenhancing pancreas head mass inseparable from the third portion of the duodenum, 1 cm left retroperitoneal lymph node, no vascular involvement Cycle 1 FOLFIRINOX 06/19/2021 Cycle 2 FOLFIRINOX  07/02/2021, irinotecan and 5-FU dose reduced, prophylactic dexamethasone added beginning day of pump discontinuation Cycle 3 FOLFIRINOX 07/23/2021, Udenyca CT abdomen/pelvis 08/01/2021-done in the emergency department to evaluate epigastric pain-pancreas head mass decreased in size.  Diffuse gaseous distention of large and small bowel.  No bowel obstruction or focal inflammation. Cycle 4 FOLFIRINOX 08/06/2021, Udenyca Cycle 5 FOLFIRINOX 08/20/2021, Udenyca Cycle 6 FOLFIRINOX 09/04/2021, Udenyca Cycle 7 FOLFIRINOX 09/17/2021, Udenyca Cycle 8 FOLFIRINOX 10/01/2021, Udenyca Obstructive jaundice secondary #1 Hypertension Admission in February 2022 with elevated liver enzymes MRI/MRCP-intra and extrahepatic biliary duct dilation, tapering smoothly to the ampulla, no mass or filling defect ERCP 10/06/2020-dilated main bile duct, biliary tree was swept and nothing was found, cytology from bile duct brushing-no malignant cells identified 5.  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  GERD 7.  Squamous cell carcinoma left forearm 2014 8.  Hyperlipidemia 9.  CT chest 08/01/2021, done in the emergency department to evaluate pleuritic chest pain-no PE.  Advanced three-vessel coronary vascular calcification. 10.  Fever of unknown origin beginning 08/14/2021-blood and urine cultures negative, no source for infection identified, placed on antibiotic prophylaxis beginning 08/17/2021    Disposition: David Jarvis appears stable.  He has completed 7 cycles of FOLFIRINOX.  Plan to proceed with cycle 8 today as scheduled.  He has scans and follow-up at [facility] on 10/16/2021.  We reviewed the CBC and chemistry panel from today.  Labs adequate to proceed with treatment.  He has moderate thrombocytopenia.  He will contact the office with bleeding.  He will return for lab and follow-up on 10/19/2021.  We are available to see him sooner if needed.  Plan reviewed with Dr. Benay Spice.      Ned Card ANP/GNP-BC  10/01/2021   8:56 AM

## 2021-10-01 NOTE — Patient Instructions (Addendum)
[facility]  The chemotherapy medication bag should finish at 46 hours, 96 hours, or 7 days. For example, if your pump is scheduled for 46 hours and it was put on at 4:00 p.m., it should finish at 2:00 p.m. the day it is scheduled to come off regardless of your appointment time.     Estimated time to finish at 12:30 Wednesday,  October 03, 2021.   If the display on your pump reads "Low Volume" and it is beeping, take the batteries out of the pump and come to the cancer center for it to be taken off.   If the pump alarms go off prior to the pump reading "Low Volume" then call [phone] and someone can assist you.  If the plunger comes out and the chemotherapy medication is leaking out, please use your home chemo spill kit to clean up the spill. Do NOT use paper towels or other household products.  If you have problems or questions regarding your pump, please call either [phone] (24 hours a day) or the cancer center Monday-Friday 8:00 a.m.- 4:30 p.m. at the clinic number and we will assist you. If you are unable to get assistance, then go to the nearest Emergency Department and ask the staff to contact the IV team for assistance.   Discharge Instructions: Thank you for choosing [facility] to provide your oncology and hematology care.   If you have a lab appointment with the [facility], please go directly to the [facility] and check in at the registration area.   Wear comfortable clothing and clothing appropriate for easy access to any Portacath or PICC line.   We strive to give you quality time with your provider. You may need to reschedule your appointment if you arrive late (15 or more minutes).  Arriving late affects you and other patients whose appointments are after yours.  Also, if you miss three or more appointments without notifying the office, you may be dismissed from the clinic at the providers discretion.      For  prescription refill requests, have your pharmacy contact our office and allow 72 hours for refills to be completed.    Today you received the following chemotherapy and/or immunotherapy agents Oxaliplatin, leucovorin, irinotecan, fluorouracil       To help prevent nausea and vomiting after your treatment, we encourage you to take your nausea medication as directed.  BELOW ARE SYMPTOMS THAT SHOULD BE REPORTED IMMEDIATELY: *FEVER GREATER THAN 100.4 F (38 C) OR HIGHER *CHILLS OR SWEATING *NAUSEA AND VOMITING THAT IS NOT CONTROLLED WITH YOUR NAUSEA MEDICATION *UNUSUAL SHORTNESS OF BREATH *UNUSUAL BRUISING OR BLEEDING *URINARY PROBLEMS (pain or burning when urinating, or frequent urination) *BOWEL PROBLEMS (unusual diarrhea, constipation, pain near the anus) TENDERNESS IN MOUTH AND THROAT WITH OR WITHOUT PRESENCE OF ULCERS (sore throat, sores in mouth, or a toothache) UNUSUAL RASH, SWELLING OR PAIN  UNUSUAL VAGINAL DISCHARGE OR ITCHING   Items with * indicate a potential emergency and should be followed up as soon as possible or go to the Emergency Department if any problems should occur.  Please show the CHEMOTHERAPY ALERT CARD or IMMUNOTHERAPY ALERT CARD at check-in to the Emergency Department and triage nurse.  Should you have questions after your visit or need to cancel or reschedule your appointment, please contact [facility]  Dept: [phone]  and follow the prompts.  Office hours are 8:00 a.m. to 4:30 p.m. Monday - Friday. Please  note that voicemails left after 4:00 p.m. may not be returned until the following business day.  We are closed weekends and major holidays. You have access to a nurse at all times for urgent questions. Please call the main number to the clinic Dept: [phone] and follow the prompts.   For any non-urgent questions, you may also contact your provider using MyChart. We now offer e-Visits for anyone 83 and older to request care  online for non-urgent symptoms. For details visit [email or web address].   Also download the MyChart app! Go to the app store, search "MyChart", open the app, select [facility], and log in with your MyChart username and password.  Due to Covid, a mask is required upon entering the hospital/clinic. If you do not have a mask, one will be given to you upon arrival. For doctor visits, patients may have 1 support person aged 5 or older with them. For treatment visits, patients cannot have anyone with them due to current Covid guidelines and our immunocompromised population.   Oxaliplatin Injection What is this medication? OXALIPLATIN (ox AL i PLA tin) is a chemotherapy drug. It targets fast dividing cells, like cancer cells, and causes these cells to die. This medicine is used to treat cancers of the colon and rectum, and many other cancers. This medicine may be used for other purposes; ask your health care provider or pharmacist if you have questions. COMMON BRAND NAME(S): Eloxatin What should I tell my care team before I take this medication? They need to know if you have any of these conditions: heart disease history of irregular heartbeat liver disease low blood counts, like white cells, platelets, or red blood cells lung or breathing disease, like asthma take medicines that treat or prevent blood clots tingling of the fingers or toes, or other nerve disorder an unusual or allergic reaction to oxaliplatin, other chemotherapy, other medicines, foods, dyes, or preservatives pregnant or trying to get pregnant breast-feeding How should I use this medication? This drug is given as an infusion into a vein. It is administered in a hospital or clinic by a specially trained health care professional. Talk to your pediatrician regarding the use of this medicine in children. Special care may be needed. Overdosage: If you think you have taken too much of this medicine contact a poison control center  or emergency room at once. NOTE: This medicine is only for you. Do not share this medicine with others. What if I miss a dose? It is important not to miss a dose. Call your doctor or health care professional if you are unable to keep an appointment. What may interact with this medication? Do not take this medicine with any of the following medications: cisapride dronedarone pimozide thioridazine This medicine may also interact with the following medications: aspirin and aspirin-like medicines certain medicines that treat or prevent blood clots like warfarin, apixaban, dabigatran, and rivaroxaban cisplatin cyclosporine diuretics medicines for infection like acyclovir, adefovir, amphotericin B, bacitracin, cidofovir, foscarnet, ganciclovir, gentamicin, pentamidine, vancomycin NSAIDs, medicines for pain and inflammation, like ibuprofen or naproxen other medicines that prolong the QT interval (an abnormal heart rhythm) pamidronate zoledronic acid This list may not describe all possible interactions. Give your health care provider a list of all the medicines, herbs, non-prescription drugs, or dietary supplements you use. Also tell them if you smoke, drink alcohol, or use illegal drugs. Some items may interact with your medicine. What should I watch for while using this medication? Your condition will be monitored carefully while you  are receiving this medicine. You may need blood work done while you are taking this medicine. This medicine may make you feel generally unwell. This is not uncommon as chemotherapy can affect healthy cells as well as cancer cells. Report any side effects. Continue your course of treatment even though you feel ill unless your healthcare professional tells you to stop. This medicine can make you more sensitive to cold. Do not drink cold drinks or use ice. Cover exposed skin before coming in contact with cold temperatures or cold objects. When out in cold weather wear  warm clothing and cover your mouth and nose to warm the air that goes into your lungs. Tell your doctor if you get sensitive to the cold. Do not become pregnant while taking this medicine or for 9 months after stopping it. Women should inform their health care professional if they wish to become pregnant or think they might be pregnant. Men should not father a child while taking this medicine and for 6 months after stopping it. There is potential for serious side effects to an unborn child. Talk to your health care professional for more information. Do not breast-feed a child while taking this medicine or for 3 months after stopping it. This medicine has caused ovarian failure in some women. This medicine may make it more difficult to get pregnant. Talk to your health care professional if you are concerned about your fertility. This medicine has caused decreased sperm counts in some men. This may make it more difficult to father a child. Talk to your health care professional if you are concerned about your fertility. This medicine may increase your risk of getting an infection. Call your health care professional for advice if you get a fever, chills, or sore throat, or other symptoms of a cold or flu. Do not treat yourself. Try to avoid being around people who are sick. Avoid taking medicines that contain aspirin, acetaminophen, ibuprofen, naproxen, or ketoprofen unless instructed by your health care professional. These medicines may hide a fever. Be careful brushing or flossing your teeth or using a toothpick because you may get an infection or bleed more easily. If you have any dental work done, tell your dentist you are receiving this medicine. What side effects may I notice from receiving this medication? Side effects that you should report to your doctor or health care professional as soon as possible: allergic reactions like skin rash, itching or hives, swelling of the face, lips, or  tongue breathing problems cough low blood counts - this medicine may decrease the number of white blood cells, red blood cells, and platelets. You may be at increased risk for infections and bleeding nausea, vomiting pain, redness, or irritation at site where injected pain, tingling, numbness in the hands or feet signs and symptoms of bleeding such as bloody or black, tarry stools; red or dark brown urine; spitting up blood or brown material that looks like coffee grounds; red spots on the skin; unusual bruising or bleeding from the eyes, gums, or nose signs and symptoms of a dangerous change in heartbeat or heart rhythm like chest pain; dizziness; fast, irregular heartbeat; palpitations; feeling faint or lightheaded; falls signs and symptoms of infection like fever; chills; cough; sore throat; pain or trouble passing urine signs and symptoms of liver injury like dark yellow or brown urine; general ill feeling or flu-like symptoms; light-colored stools; loss of appetite; nausea; right upper belly pain; unusually weak or tired; yellowing of the eyes or skin signs  and symptoms of low red blood cells or anemia such as unusually weak or tired; feeling faint or lightheaded; falls signs and symptoms of muscle injury like dark urine; trouble passing urine or change in the amount of urine; unusually weak or tired; muscle pain; back pain Side effects that usually do not require medical attention (report to your doctor or health care professional if they continue or are bothersome): changes in taste diarrhea gas hair loss loss of appetite mouth sores This list may not describe all possible side effects. Call your doctor for medical advice about side effects. You may report side effects to FDA at 1-800-FDA-1088. Where should I keep my medication? This drug is given in a hospital or clinic and will not be stored at home. NOTE: This sheet is a summary. It may not cover all possible information. If you have  questions about this medicine, talk to your doctor, pharmacist, or health care provider.  2022 Elsevier/Gold Standard (2021-05-08 00:00:00)  Leucovorin injection What is this medication? LEUCOVORIN (loo koe VOR in) is used to prevent or treat the harmful effects of some medicines. This medicine is used to treat anemia caused by a low amount of folic acid in the body. It is also used with 5-fluorouracil (5-FU) to treat colon cancer. This medicine may be used for other purposes; ask your health care provider or pharmacist if you have questions. What should I tell my care team before I take this medication? They need to know if you have any of these conditions: anemia from low levels of vitamin B-12 in the blood an unusual or allergic reaction to leucovorin, folic acid, other medicines, foods, dyes, or preservatives pregnant or trying to get pregnant breast-feeding How should I use this medication? This medicine is for injection into a muscle or into a vein. It is given by a health care professional in a hospital or clinic setting. Talk to your pediatrician regarding the use of this medicine in children. Special care may be needed. Overdosage: If you think you have taken too much of this medicine contact a poison control center or emergency room at once. NOTE: This medicine is only for you. Do not share this medicine with others. What if I miss a dose? This does not apply. What may interact with this medication? capecitabine fluorouracil phenobarbital phenytoin primidone trimethoprim-sulfamethoxazole This list may not describe all possible interactions. Give your health care provider a list of all the medicines, herbs, non-prescription drugs, or dietary supplements you use. Also tell them if you smoke, drink alcohol, or use illegal drugs. Some items may interact with your medicine. What should I watch for while using this medication? Your condition will be monitored carefully while you are  receiving this medicine. This medicine may increase the side effects of 5-fluorouracil, 5-FU. Tell your doctor or health care professional if you have diarrhea or mouth sores that do not get better or that get worse. What side effects may I notice from receiving this medication? Side effects that you should report to your doctor or health care professional as soon as possible: allergic reactions like skin rash, itching or hives, swelling of the face, lips, or tongue breathing problems fever, infection mouth sores unusual bleeding or bruising unusually weak or tired Side effects that usually do not require medical attention (report to your doctor or health care professional if they continue or are bothersome): constipation or diarrhea loss of appetite nausea, vomiting This list may not describe all possible side effects. Call your  doctor for medical advice about side effects. You may report side effects to FDA at 1-800-FDA-1088. Where should I keep my medication? This drug is given in a hospital or clinic and will not be stored at home. NOTE: This sheet is a summary. It may not cover all possible information. If you have questions about this medicine, talk to your doctor, pharmacist, or health care provider.  2022 Elsevier/Gold Standard (2008-02-25 00:00:00)  Irinotecan injection What is this medication? IRINOTECAN (ir in oh TEE kan ) is a chemotherapy drug. It is used to treat colon and rectal cancer. This medicine may be used for other purposes; ask your health care provider or pharmacist if you have questions. COMMON BRAND NAME(S): Camptosar What should I tell my care team before I take this medication? They need to know if you have any of these conditions: dehydration diarrhea infection (especially a virus infection such as chickenpox, cold sores, or herpes) liver disease low blood counts, like low white cell, platelet, or red cell counts low levels of calcium, magnesium, or  potassium in the blood recent or ongoing radiation therapy an unusual or allergic reaction to irinotecan, other medicines, foods, dyes, or preservatives pregnant or trying to get pregnant breast-feeding How should I use this medication? This drug is given as an infusion into a vein. It is administered in a hospital or clinic by a specially trained health care professional. Talk to your pediatrician regarding the use of this medicine in children. Special care may be needed. Overdosage: If you think you have taken too much of this medicine contact a poison control center or emergency room at once. NOTE: This medicine is only for you. Do not share this medicine with others. What if I miss a dose? It is important not to miss your dose. Call your doctor or health care professional if you are unable to keep an appointment. What may interact with this medication? Do not take this medicine with any of the following medications: cobicistat itraconazole This medicine may interact with the following medications: antiviral medicines for HIV or AIDS certain antibiotics like rifampin or rifabutin certain medicines for fungal infections like ketoconazole, posaconazole, and voriconazole certain medicines for seizures like carbamazepine, phenobarbital, phenotoin clarithromycin gemfibrozil nefazodone [place]'s Wort This list may not describe all possible interactions. Give your health care provider a list of all the medicines, herbs, non-prescription drugs, or dietary supplements you use. Also tell them if you smoke, drink alcohol, or use illegal drugs. Some items may interact with your medicine. What should I watch for while using this medication? Your condition will be monitored carefully while you are receiving this medicine. You will need important blood work done while you are taking this medicine. This drug may make you feel generally unwell. This is not uncommon, as chemotherapy can affect healthy  cells as well as cancer cells. Report any side effects. Continue your course of treatment even though you feel ill unless your doctor tells you to stop. In some cases, you may be given additional medicines to help with side effects. Follow all directions for their use. You may get drowsy or dizzy. Do not drive, use machinery, or do anything that needs mental alertness until you know how this medicine affects you. Do not stand or sit up quickly, especially if you are an older patient. This reduces the risk of dizzy or fainting spells. Call your health care professional for advice if you get a fever, chills, or sore throat, or other symptoms of  a cold or flu. Do not treat yourself. This medicine decreases your body's ability to fight infections. Try to avoid being around people who are sick. Avoid taking products that contain aspirin, acetaminophen, ibuprofen, naproxen, or ketoprofen unless instructed by your doctor. These medicines may hide a fever. This medicine may increase your risk to bruise or bleed. Call your doctor or health care professional if you notice any unusual bleeding. Be careful brushing and flossing your teeth or using a toothpick because you may get an infection or bleed more easily. If you have any dental work done, tell your dentist you are receiving this medicine. Do not become pregnant while taking this medicine or for 6 months after stopping it. Women should inform their health care professional if they wish to become pregnant or think they might be pregnant. Men should not father a child while taking this medicine and for 3 months after stopping it. There is potential for serious side effects to an unborn child. Talk to your health care professional for more information. Do not breast-feed an infant while taking this medicine or for 7 days after stopping it. This medicine has caused ovarian failure in some women. This medicine may make it more difficult to get pregnant. Talk to your  health care professional if you are concerned about your fertility. This medicine has caused decreased sperm counts in some men. This may make it more difficult to father a child. Talk to your health care professional if you are concerned about your fertility. What side effects may I notice from receiving this medication? Side effects that you should report to your doctor or health care professional as soon as possible: allergic reactions like skin rash, itching or hives, swelling of the face, lips, or tongue chest pain diarrhea flushing, runny nose, sweating during infusion low blood counts - this medicine may decrease the number of white blood cells, red blood cells and platelets. You may be at increased risk for infections and bleeding. nausea, vomiting pain, swelling, warmth in the leg signs of decreased platelets or bleeding - bruising, pinpoint red spots on the skin, black, tarry stools, blood in the urine signs of infection - fever or chills, cough, sore throat, pain or difficulty passing urine signs of decreased red blood cells - unusually weak or tired, fainting spells, lightheadedness Side effects that usually do not require medical attention (report to your doctor or health care professional if they continue or are bothersome): constipation hair loss headache loss of appetite mouth sores stomach pain This list may not describe all possible side effects. Call your doctor for medical advice about side effects. You may report side effects to FDA at 1-800-FDA-1088. Where should I keep my medication? This drug is given in a hospital or clinic and will not be stored at home. NOTE: This sheet is a summary. It may not cover all possible information. If you have questions about this medicine, talk to your doctor, pharmacist, or health care provider.  2022 Elsevier/Gold Standard (2021-05-08 00:00:00)  Fluorouracil, 5-FU injection What is this medication? FLUOROURACIL, 5-FU (flure oh  YOOR a sil) is a chemotherapy drug. It slows the growth of cancer cells. This medicine is used to treat many types of cancer like breast cancer, colon or rectal cancer, pancreatic cancer, and stomach cancer. This medicine may be used for other purposes; ask your health care provider or pharmacist if you have questions. COMMON BRAND NAME(S): Adrucil What should I tell my care team before I take this medication?  They need to know if you have any of these conditions: blood disorders dihydropyrimidine dehydrogenase (DPD) deficiency infection (especially a virus infection such as chickenpox, cold sores, or herpes) kidney disease liver disease malnourished, poor nutrition recent or ongoing radiation therapy an unusual or allergic reaction to fluorouracil, other chemotherapy, other medicines, foods, dyes, or preservatives pregnant or trying to get pregnant breast-feeding How should I use this medication? This drug is given as an infusion or injection into a vein. It is administered in a hospital or clinic by a specially trained health care professional. Talk to your pediatrician regarding the use of this medicine in children. Special care may be needed. Overdosage: If you think you have taken too much of this medicine contact a poison control center or emergency room at once. NOTE: This medicine is only for you. Do not share this medicine with others. What if I miss a dose? It is important not to miss your dose. Call your doctor or health care professional if you are unable to keep an appointment. What may interact with this medication? Do not take this medicine with any of the following medications: live virus vaccines This medicine may also interact with the following medications: medicines that treat or prevent blood clots like warfarin, enoxaparin, and dalteparin This list may not describe all possible interactions. Give your health care provider a list of all the medicines, herbs,  non-prescription drugs, or dietary supplements you use. Also tell them if you smoke, drink alcohol, or use illegal drugs. Some items may interact with your medicine. What should I watch for while using this medication? Visit your doctor for checks on your progress. This drug may make you feel generally unwell. This is not uncommon, as chemotherapy can affect healthy cells as well as cancer cells. Report any side effects. Continue your course of treatment even though you feel ill unless your doctor tells you to stop. In some cases, you may be given additional medicines to help with side effects. Follow all directions for their use. Call your doctor or health care professional for advice if you get a fever, chills or sore throat, or other symptoms of a cold or flu. Do not treat yourself. This drug decreases your body's ability to fight infections. Try to avoid being around people who are sick. This medicine may increase your risk to bruise or bleed. Call your doctor or health care professional if you notice any unusual bleeding. Be careful brushing and flossing your teeth or using a toothpick because you may get an infection or bleed more easily. If you have any dental work done, tell your dentist you are receiving this medicine. Avoid taking products that contain aspirin, acetaminophen, ibuprofen, naproxen, or ketoprofen unless instructed by your doctor. These medicines may hide a fever. Do not become pregnant while taking this medicine. Women should inform their doctor if they wish to become pregnant or think they might be pregnant. There is a potential for serious side effects to an unborn child. Talk to your health care professional or pharmacist for more information. Do not breast-feed an infant while taking this medicine. Men should inform their doctor if they wish to father a child. This medicine may lower sperm counts. Do not treat diarrhea with over the counter products. Contact your doctor if you  have diarrhea that lasts more than 2 days or if it is severe and watery. This medicine can make you more sensitive to the sun. Keep out of the sun. If you cannot avoid  being in the sun, wear protective clothing and use sunscreen. Do not use sun lamps or tanning beds/booths. What side effects may I notice from receiving this medication? Side effects that you should report to your doctor or health care professional as soon as possible: allergic reactions like skin rash, itching or hives, swelling of the face, lips, or tongue low blood counts - this medicine may decrease the number of white blood cells, red blood cells and platelets. You may be at increased risk for infections and bleeding. signs of infection - fever or chills, cough, sore throat, pain or difficulty passing urine signs of decreased platelets or bleeding - bruising, pinpoint red spots on the skin, black, tarry stools, blood in the urine signs of decreased red blood cells - unusually weak or tired, fainting spells, lightheadedness breathing problems changes in vision chest pain mouth sores nausea and vomiting pain, swelling, redness at site where injected pain, tingling, numbness in the hands or feet redness, swelling, or sores on hands or feet stomach pain unusual bleeding Side effects that usually do not require medical attention (report to your doctor or health care professional if they continue or are bothersome): changes in finger or toe nails diarrhea dry or itchy skin hair loss headache loss of appetite sensitivity of eyes to the light stomach upset unusually teary eyes This list may not describe all possible side effects. Call your doctor for medical advice about side effects. You may report side effects to FDA at 1-800-FDA-1088. Where should I keep my medication? This drug is given in a hospital or clinic and will not be stored at home. NOTE: This sheet is a summary. It may not cover all possible information. If  you have questions about this medicine, talk to your doctor, pharmacist, or health care provider.  2022 Elsevier/Gold Standard (2021-05-08 00:00:00)

## 2021-10-02 LAB — CANCER ANTIGEN 19-9: CA 19-9: 28 U/mL (ref 0–35)

## 2021-10-03 ENCOUNTER — Inpatient Hospital Stay: Payer: Medicare HMO | Attending: Oncology

## 2021-10-03 ENCOUNTER — Other Ambulatory Visit: Payer: Self-pay

## 2021-10-03 VITALS — BP 134/85 | HR 61 | Temp 98.3°F | Resp 18

## 2021-10-03 DIAGNOSIS — E785 Hyperlipidemia, unspecified: Secondary | ICD-10-CM | POA: Diagnosis not present

## 2021-10-03 DIAGNOSIS — R509 Fever, unspecified: Secondary | ICD-10-CM | POA: Insufficient documentation

## 2021-10-03 DIAGNOSIS — I700 Atherosclerosis of aorta: Secondary | ICD-10-CM | POA: Diagnosis not present

## 2021-10-03 DIAGNOSIS — R7303 Prediabetes: Secondary | ICD-10-CM | POA: Diagnosis not present

## 2021-10-03 DIAGNOSIS — C259 Malignant neoplasm of pancreas, unspecified: Secondary | ICD-10-CM | POA: Diagnosis not present

## 2021-10-03 DIAGNOSIS — Z79899 Other long term (current) drug therapy: Secondary | ICD-10-CM | POA: Diagnosis not present

## 2021-10-03 DIAGNOSIS — C772 Secondary and unspecified malignant neoplasm of intra-abdominal lymph nodes: Secondary | ICD-10-CM | POA: Insufficient documentation

## 2021-10-03 DIAGNOSIS — Z95828 Presence of other vascular implants and grafts: Secondary | ICD-10-CM

## 2021-10-03 DIAGNOSIS — Z5189 Encounter for other specified aftercare: Secondary | ICD-10-CM | POA: Diagnosis not present

## 2021-10-03 DIAGNOSIS — I10 Essential (primary) hypertension: Secondary | ICD-10-CM | POA: Insufficient documentation

## 2021-10-03 DIAGNOSIS — K219 Gastro-esophageal reflux disease without esophagitis: Secondary | ICD-10-CM | POA: Insufficient documentation

## 2021-10-03 DIAGNOSIS — E039 Hypothyroidism, unspecified: Secondary | ICD-10-CM | POA: Diagnosis not present

## 2021-10-03 DIAGNOSIS — C250 Malignant neoplasm of head of pancreas: Secondary | ICD-10-CM | POA: Diagnosis not present

## 2021-10-03 DIAGNOSIS — C61 Malignant neoplasm of prostate: Secondary | ICD-10-CM | POA: Diagnosis not present

## 2021-10-03 DIAGNOSIS — E782 Mixed hyperlipidemia: Secondary | ICD-10-CM | POA: Diagnosis not present

## 2021-10-03 MED ORDER — PEGFILGRASTIM-CBQV 6 MG/0.6ML ~~LOC~~ SOSY
6.0000 mg | PREFILLED_SYRINGE | Freq: Once | SUBCUTANEOUS | Status: AC
  Administered 2021-10-03: 6 mg via SUBCUTANEOUS
  Filled 2021-10-03: qty 0.6

## 2021-10-03 MED ORDER — HEPARIN SOD (PORK) LOCK FLUSH 100 UNIT/ML IV SOLN
500.0000 [IU] | Freq: Once | INTRAVENOUS | Status: AC
  Administered 2021-10-03: 500 [IU] via INTRAVENOUS

## 2021-10-03 MED ORDER — SODIUM CHLORIDE 0.9% FLUSH
10.0000 mL | Freq: Once | INTRAVENOUS | Status: AC
  Administered 2021-10-03: 10 mL via INTRAVENOUS

## 2021-10-03 NOTE — Patient Instructions (Signed)

## 2021-10-11 ENCOUNTER — Other Ambulatory Visit: Payer: Self-pay | Admitting: *Deleted

## 2021-10-11 ENCOUNTER — Encounter: Payer: Self-pay | Admitting: Nurse Practitioner

## 2021-10-11 ENCOUNTER — Other Ambulatory Visit (HOSPITAL_BASED_OUTPATIENT_CLINIC_OR_DEPARTMENT_OTHER): Payer: Self-pay

## 2021-10-11 ENCOUNTER — Other Ambulatory Visit: Payer: Self-pay

## 2021-10-11 ENCOUNTER — Encounter: Payer: Self-pay | Admitting: *Deleted

## 2021-10-11 ENCOUNTER — Inpatient Hospital Stay: Payer: Medicare HMO | Admitting: Nurse Practitioner

## 2021-10-11 ENCOUNTER — Inpatient Hospital Stay: Payer: Medicare HMO

## 2021-10-11 VITALS — BP 140/88 | HR 84 | Temp 97.9°F | Resp 18 | Ht 66.0 in | Wt 152.4 lb

## 2021-10-11 DIAGNOSIS — K219 Gastro-esophageal reflux disease without esophagitis: Secondary | ICD-10-CM | POA: Diagnosis not present

## 2021-10-11 DIAGNOSIS — C250 Malignant neoplasm of head of pancreas: Secondary | ICD-10-CM

## 2021-10-11 DIAGNOSIS — C772 Secondary and unspecified malignant neoplasm of intra-abdominal lymph nodes: Secondary | ICD-10-CM | POA: Diagnosis not present

## 2021-10-11 DIAGNOSIS — Z79899 Other long term (current) drug therapy: Secondary | ICD-10-CM | POA: Diagnosis not present

## 2021-10-11 DIAGNOSIS — I10 Essential (primary) hypertension: Secondary | ICD-10-CM | POA: Diagnosis not present

## 2021-10-11 DIAGNOSIS — R509 Fever, unspecified: Secondary | ICD-10-CM | POA: Diagnosis not present

## 2021-10-11 DIAGNOSIS — E785 Hyperlipidemia, unspecified: Secondary | ICD-10-CM | POA: Diagnosis not present

## 2021-10-11 DIAGNOSIS — Z5189 Encounter for other specified aftercare: Secondary | ICD-10-CM | POA: Diagnosis not present

## 2021-10-11 LAB — CBC WITH DIFFERENTIAL (CANCER CENTER ONLY)
Abs Immature Granulocytes: 0.2 10*3/uL — ABNORMAL HIGH (ref 0.00–0.07)
Band Neutrophils: 3 %
Basophils Absolute: 0 10*3/uL (ref 0.0–0.1)
Basophils Relative: 0 %
Eosinophils Absolute: 0 10*3/uL (ref 0.0–0.5)
Eosinophils Relative: 0 %
HCT: 33.1 % — ABNORMAL LOW (ref 39.0–52.0)
Hemoglobin: 10.6 g/dL — ABNORMAL LOW (ref 13.0–17.0)
Lymphocytes Relative: 18 %
Lymphs Abs: 1.3 10*3/uL (ref 0.7–4.0)
MCH: 30.3 pg (ref 26.0–34.0)
MCHC: 32 g/dL (ref 30.0–36.0)
MCV: 94.6 fL (ref 80.0–100.0)
Metamyelocytes Relative: 2 %
Monocytes Absolute: 1 10*3/uL (ref 0.1–1.0)
Monocytes Relative: 14 %
Myelocytes: 1 %
Neutro Abs: 4.6 10*3/uL (ref 1.7–7.7)
Neutrophils Relative %: 62 %
Platelet Count: 58 10*3/uL — ABNORMAL LOW (ref 150–400)
RBC: 3.5 MIL/uL — ABNORMAL LOW (ref 4.22–5.81)
RDW: 16.5 % — ABNORMAL HIGH (ref 11.5–15.5)
Smear Review: DECREASED
WBC Count: 7.1 10*3/uL (ref 4.0–10.5)
nRBC: 0 % (ref 0.0–0.2)

## 2021-10-11 LAB — COMPREHENSIVE METABOLIC PANEL
ALT: 54 U/L — ABNORMAL HIGH (ref 0–44)
AST: 31 U/L (ref 15–41)
Albumin: 4.4 g/dL (ref 3.5–5.0)
Alkaline Phosphatase: 143 U/L — ABNORMAL HIGH (ref 38–126)
Anion gap: 10 (ref 5–15)
BUN: 10 mg/dL (ref 8–23)
CO2: 25 mmol/L (ref 22–32)
Calcium: 9.1 mg/dL (ref 8.9–10.3)
Chloride: 101 mmol/L (ref 98–111)
Creatinine, Ser: 1.08 mg/dL (ref 0.61–1.24)
GFR, Estimated: >60 mL/min (ref >60)
Glucose, Bld: 139 mg/dL — ABNORMAL HIGH (ref 70–99)
Potassium: 3.8 mmol/L (ref 3.5–5.1)
Sodium: 136 mmol/L (ref 135–145)
Total Bilirubin: 0.4 mg/dL (ref 0.3–1.2)
Total Protein: 7.5 g/dL (ref 6.5–8.1)

## 2021-10-11 LAB — URINALYSIS, COMPLETE (UACMP) WITH MICROSCOPIC
Bilirubin Urine: NEGATIVE
Glucose, UA: NEGATIVE mg/dL
Hgb urine dipstick: NEGATIVE
Ketones, ur: NEGATIVE mg/dL
Leukocytes,Ua: NEGATIVE
Nitrite: NEGATIVE
Protein, ur: NEGATIVE mg/dL
Specific Gravity, Urine: 1.005 (ref 1.005–1.030)
pH: 6.5 (ref 5.0–8.0)

## 2021-10-11 MED ORDER — LEVOFLOXACIN 500 MG PO TABS
500.0000 mg | ORAL_TABLET | Freq: Every day | ORAL | 0 refills | Status: DC
  Filled 2021-10-11: qty 7, 7d supply, fill #0

## 2021-10-11 NOTE — Progress Notes (Unsigned)
blo

## 2021-10-11 NOTE — Progress Notes (Signed)
[place] OFFICE PROGRESS NOTE   Diagnosis: Pancreas cancer  INTERVAL HISTORY:   David Jarvis returns prior to scheduled follow-up for evaluation of a fever.  He completed cycle 8 FOLFIRINOX 10/01/2021.  On 10/09/2021 at 4:00 in the afternoon he felt as if he had a fever.  He checked his temperature, 101.1.  He took Tylenol at 4 PM and 8 PM.  The fever resolved.  On 10/10/2021 he again felt feverish at 4 PM, temperature 101 point something.  He took Tylenol.  He otherwise feels well.  No chills.  No shortness of breath or cough.  No urinary symptoms.  No discomfort at the Port-A-Cath site.  No significant abdominal pain.  Objective:  Vital signs in last 24 hours:  Blood pressure 140/88, pulse 84, temperature 97.9 F (36.6 C), temperature source Oral, resp. rate 18, height 5\' 6"  (1.676 m), weight 152 lb 6.4 oz (69.1 kg), SpO2 99 %.    HEENT: Sclera anicteric.  Oropharynx is without thrush or ulceration. Resp: Lungs clear bilaterally. Cardio: Regular rate and rhythm. GI: Abdomen soft and nontender.  No hepatomegaly. Vascular: No leg edema. Neuro: Alert and oriented. Skin: No rash. Port-A-Cath nontender and without erythema   Lab Results:  Lab Results  Component Value Date   WBC 7.1 10/11/2021   HGB 10.6 (L) 10/11/2021   HCT 33.1 (L) 10/11/2021   MCV 94.6 10/11/2021   PLT 58 (L) 10/11/2021   NEUTROABS 4.6 10/11/2021    Imaging:  No results found.  Medications: I have reviewed the patient's current medications.  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, stent placed EUS 05/23/2021-25 x 15 mm pancreas head mass with irregular borders, no lymphadenopathy, no vascular involvement, T2 N0 by ultrasound CT chest 06/01/2021-negative for metastatic  disease CT abdomen pancreas protocol 06/01/2021-3.2 x 2.3 cm hypoenhancing pancreas head mass inseparable from the third portion of the duodenum, 1 cm left retroperitoneal lymph node, no vascular involvement Cycle 1 FOLFIRINOX 06/19/2021 Cycle 2 FOLFIRINOX 07/02/2021, irinotecan and 5-FU dose reduced, prophylactic dexamethasone added beginning day of pump discontinuation Cycle 3 FOLFIRINOX 07/23/2021, Udenyca CT abdomen/pelvis 08/01/2021-done in the emergency department to evaluate epigastric pain-pancreas head mass decreased in size.  Diffuse gaseous distention of large and small bowel.  No bowel obstruction or focal inflammation. Cycle 4 FOLFIRINOX 08/06/2021, [facility] Cycle 5 FOLFIRINOX 08/20/2021, [facility] Cycle 6 FOLFIRINOX 09/04/2021, [facility] Cycle 7 FOLFIRINOX 09/17/2021, Udeny[facility] Cycle 8 FOLFIRINOX 10/01/2021, Uden[facility] Obstructive jaundice secondary #1 Hypertension Admission in February 2022 with elevated liver enzymes MRI/MRCP-intra and extrahepatic biliary duct dilation, tapering smoothly to the ampulla, no mass or filling defect ERCP 10/06/2020-dilated main bile duct, biliary tree was swept and nothing was found, cytology from bile duct brushing-no malignant cells identified 5.  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  GERD 7.  Squamous cell carcinoma left forearm 2014 8.  Hyperlipidemia 9.  CT chest 08/01/2021, done in the emergency department to evaluate pleuritic chest pain-no PE.  Advanced three-vessel coronary vascular calcification. 10.  Fever of unknown origin beginning 08/14/2021-blood and urine cultures negative, no source for infection identified, placed on antibiotic prophylaxis beginning 08/17/2021    Disposition: David Jarvis.  He completed cycle 8 FOLFIRINOX 10/01/2021.  He is scheduled for scans and follow-up with surgical oncology at [facility] next week.  He is seen today for evaluation of  intermittent fever over the past 2 days.  The temperature elevations occurred in  the afternoon hours, resolved after Tylenol.  No localizing source based on history or physical examination.  CBC overall looks good.  He is not neutropenic.  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  Chemistry panel with mild elevation of ALT and alkaline phosphatase.  Bilirubin is normal.  Blood and urine cultures obtained.  He will complete a 7-day course of Levaquin.  He understands to contact the office with persistent fever or other concerning symptoms.  He will return for follow-up as scheduled.  Plan reviewed with Dr. Benay Spice.    Ned Card ANP/GNP-BC   10/11/2021  2:32 PM

## 2021-10-11 NOTE — Progress Notes (Signed)
PATIENT NAVIGATOR PROGRESS NOTE  Name: ALFONSO CARDEN Date: 10/11/2021 MRN: [id number]  DOB: Jul 10, 1951   Reason for visit:  Phone call from patient  Comments:  Patient called and reported having diarrhea on Sunday that responded to 1 dose of imodium and patient felt good, worked on Monday outside, on Monday afternoon devleoped fever of 101 F took tylenol and fever resolved by bedtime Tuesday am no fever, Tuesday afternoon fever of 101 resolved again with Tylenol, no other symptoms, no urinary symptoms, PAC site feels okay, no pain. Wednesday afternoon fever of 100 and responded to Tylenol  Discussed with Dr Benay Spice and Ned Card NP and patient coming to clinic today for labs, blood cultures and U/A and culture    Time spent counseling/coordinating care: 30-45 minutes

## 2021-10-12 ENCOUNTER — Encounter: Payer: Self-pay | Admitting: Oncology

## 2021-10-13 LAB — URINE CULTURE: Culture: NO GROWTH

## 2021-10-15 ENCOUNTER — Telehealth: Payer: Self-pay

## 2021-10-15 NOTE — Telephone Encounter (Signed)
-----   Message from Owens Shark, NP sent at 10/15/2021  8:26 AM EST ----- Please let him know the cultures are negative.  Is he still having fever?

## 2021-10-15 NOTE — Telephone Encounter (Signed)
Called and spoke with the patient to let him know the cultures are negative. I also ask if he still have a fever? The patient stated he doesn't have a fever anymore. Patient voiced of understanding and had no further questions or concerns at this time.

## 2021-10-16 DIAGNOSIS — R932 Abnormal findings on diagnostic imaging of liver and biliary tract: Secondary | ICD-10-CM | POA: Diagnosis not present

## 2021-10-16 DIAGNOSIS — C250 Malignant neoplasm of head of pancreas: Secondary | ICD-10-CM | POA: Diagnosis not present

## 2021-10-16 DIAGNOSIS — K769 Liver disease, unspecified: Secondary | ICD-10-CM | POA: Diagnosis not present

## 2021-10-16 LAB — CULTURE, BLOOD (SINGLE)
Culture: NO GROWTH
Culture: NO GROWTH
Special Requests: ADEQUATE

## 2021-10-19 ENCOUNTER — Inpatient Hospital Stay: Payer: Medicare HMO | Admitting: Oncology

## 2021-10-19 ENCOUNTER — Inpatient Hospital Stay: Payer: Medicare HMO

## 2021-10-22 ENCOUNTER — Inpatient Hospital Stay: Payer: Medicare HMO

## 2021-10-22 ENCOUNTER — Other Ambulatory Visit: Payer: Self-pay

## 2021-10-22 ENCOUNTER — Inpatient Hospital Stay: Payer: Medicare HMO | Admitting: Oncology

## 2021-10-22 VITALS — BP 124/90 | HR 97 | Temp 97.8°F | Resp 20 | Ht 66.0 in | Wt 152.2 lb

## 2021-10-22 DIAGNOSIS — C250 Malignant neoplasm of head of pancreas: Secondary | ICD-10-CM | POA: Diagnosis not present

## 2021-10-22 DIAGNOSIS — Z5189 Encounter for other specified aftercare: Secondary | ICD-10-CM | POA: Diagnosis not present

## 2021-10-22 DIAGNOSIS — Z95828 Presence of other vascular implants and grafts: Secondary | ICD-10-CM

## 2021-10-22 DIAGNOSIS — E785 Hyperlipidemia, unspecified: Secondary | ICD-10-CM | POA: Diagnosis not present

## 2021-10-22 DIAGNOSIS — R509 Fever, unspecified: Secondary | ICD-10-CM | POA: Diagnosis not present

## 2021-10-22 DIAGNOSIS — K219 Gastro-esophageal reflux disease without esophagitis: Secondary | ICD-10-CM | POA: Diagnosis not present

## 2021-10-22 DIAGNOSIS — Z79899 Other long term (current) drug therapy: Secondary | ICD-10-CM | POA: Diagnosis not present

## 2021-10-22 DIAGNOSIS — I10 Essential (primary) hypertension: Secondary | ICD-10-CM | POA: Diagnosis not present

## 2021-10-22 DIAGNOSIS — C772 Secondary and unspecified malignant neoplasm of intra-abdominal lymph nodes: Secondary | ICD-10-CM | POA: Diagnosis not present

## 2021-10-22 LAB — CBC WITH DIFFERENTIAL (CANCER CENTER ONLY)
Abs Immature Granulocytes: 0.04 10*3/uL (ref 0.00–0.07)
Basophils Absolute: 0 10*3/uL (ref 0.0–0.1)
Basophils Relative: 0 %
Eosinophils Absolute: 0 10*3/uL (ref 0.0–0.5)
Eosinophils Relative: 0 %
HCT: 36.6 % — ABNORMAL LOW (ref 39.0–52.0)
Hemoglobin: 11.6 g/dL — ABNORMAL LOW (ref 13.0–17.0)
Immature Granulocytes: 1 %
Lymphocytes Relative: 17 %
Lymphs Abs: 1 10*3/uL (ref 0.7–4.0)
MCH: 30.1 pg (ref 26.0–34.0)
MCHC: 31.7 g/dL (ref 30.0–36.0)
MCV: 94.8 fL (ref 80.0–100.0)
Monocytes Absolute: 0.6 10*3/uL (ref 0.1–1.0)
Monocytes Relative: 10 %
Neutro Abs: 4.2 10*3/uL (ref 1.7–7.7)
Neutrophils Relative %: 72 %
Platelet Count: 220 10*3/uL (ref 150–400)
RBC: 3.86 MIL/uL — ABNORMAL LOW (ref 4.22–5.81)
RDW: 16.4 % — ABNORMAL HIGH (ref 11.5–15.5)
WBC Count: 5.8 10*3/uL (ref 4.0–10.5)
nRBC: 0 % (ref 0.0–0.2)

## 2021-10-22 LAB — CMP (CANCER CENTER ONLY)
ALT: 21 U/L (ref 0–44)
AST: 25 U/L (ref 15–41)
Albumin: 4 g/dL (ref 3.5–5.0)
Alkaline Phosphatase: 99 U/L (ref 38–126)
Anion gap: 9 (ref 5–15)
BUN: 12 mg/dL (ref 8–23)
CO2: 25 mmol/L (ref 22–32)
Calcium: 9.2 mg/dL (ref 8.9–10.3)
Chloride: 103 mmol/L (ref 98–111)
Creatinine: 1 mg/dL (ref 0.61–1.24)
GFR, Estimated: >60 mL/min (ref >60)
Glucose, Bld: 179 mg/dL — ABNORMAL HIGH (ref 70–99)
Potassium: 4 mmol/L (ref 3.5–5.1)
Sodium: 137 mmol/L (ref 135–145)
Total Bilirubin: 0.4 mg/dL (ref 0.3–1.2)
Total Protein: 6.3 g/dL — ABNORMAL LOW (ref 6.5–8.1)

## 2021-10-22 LAB — MAGNESIUM: Magnesium: 1.6 mg/dL — ABNORMAL LOW (ref 1.7–2.4)

## 2021-10-22 MED ORDER — SODIUM CHLORIDE 0.9% FLUSH
10.0000 mL | Freq: Once | INTRAVENOUS | Status: AC
  Administered 2021-10-22: 10 mL via INTRAVENOUS

## 2021-10-22 MED ORDER — HEPARIN SOD (PORK) LOCK FLUSH 100 UNIT/ML IV SOLN
500.0000 [IU] | Freq: Once | INTRAVENOUS | Status: AC
  Administered 2021-10-22: 500 [IU] via INTRAVENOUS

## 2021-10-22 NOTE — Patient Instructions (Signed)

## 2021-10-22 NOTE — Progress Notes (Signed)
[place] OFFICE PROGRESS NOTE   Diagnosis: Pancreas cancer  INTERVAL HISTORY:   David Jarvis completed a final planned cycle of FOLFIRINOX on 10/01/2021. David Jarvis developed fever on 10/09/2021 and 10/10/2021.  David Jarvis was evaluated on 10/11/2021.  David Jarvis completed a course of Levaquin.  Cultures returned negative.  The fever resolved.  No recurrent fever. David Jarvis reports an episode of nausea and vomiting with diarrhea this past weekend.  This has resolved.  David Jarvis was seen for follow-up at [facility] on 10/16/2021.  Restaging CTs found the bile duct stent no longer present.  A new linear area of hypoattenuation was noted in segment 4A.  The pancreas mass has decreased in size with similar soft tissue adjacent to the celiac axis and hepatic takeoff.  No lymphadenopathy.  David Jarvis reports surgery is being scheduled for 11/12/2021. Objective:  Vital signs in last 24 hours:  Blood pressure 124/90, pulse 97, temperature 97.8 F (36.6 C), temperature source Oral, resp. rate 20, height 5\' 6"  (1.676 m), weight 152 lb 3.2 oz (69 kg), SpO2 98 %.    HEENT: No thrush or ulcers Lymphatics: No cervical, supraclavicular, axillary, or inguinal nodes Resp: Lungs clear bilaterally Cardio: Regular rate and rhythm GI: No hepatosplenomegaly, nontender, no mass Vascular: No leg edema   Portacath/PICC-without erythema  Lab Results:  Lab Results  Component Value Date   WBC 5.8 10/22/2021   HGB 11.6 (L) 10/22/2021   HCT 36.6 (L) 10/22/2021   MCV 94.8 10/22/2021   PLT 220 10/22/2021   NEUTROABS 4.2 10/22/2021    CMP  Lab Results  Component Value Date   NA 137 10/22/2021   K 4.0 10/22/2021   CL 103 10/22/2021   CO2 25 10/22/2021   GLUCOSE 179 (H) 10/22/2021   BUN 12 10/22/2021   CREATININE 1.00 10/22/2021   CALCIUM 9.2 10/22/2021   PROT 6.3 (L) 10/22/2021   ALBUMIN 4.0 10/22/2021   AST 25 10/22/2021   ALT 21 10/22/2021   ALKPHOS 99 10/22/2021   BILITOT 0.4 10/22/2021   GFRNONAA >60 10/22/2021    Lab Results   Component Value Date   TDD220 28 10/01/2021    Medications: I have reviewed the patient's current medications.   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, stent placed EUS 05/23/2021-25 x 15 mm pancreas head mass with irregular borders, no lymphadenopathy, no vascular involvement, T2 N0 by ultrasound CT chest 06/01/2021-negative for metastatic disease CT abdomen pancreas protocol 06/01/2021-3.2 x 2.3 cm hypoenhancing pancreas head mass inseparable from the third portion of the duodenum, 1 cm left retroperitoneal lymph node, no vascular involvement Cycle 1 FOLFIRINOX 06/19/2021 Cycle 2 FOLFIRINOX 07/02/2021, irinotecan and 5-FU dose reduced, prophylactic dexamethasone added beginning day of pump discontinuation Cycle 3 FOLFIRINOX 07/23/2021, Udenyca CT abdomen/pelvis 08/01/2021-done in the emergency department to evaluate epigastric pain-pancreas head mass decreased in size.  Diffuse gaseous distention of large and small bowel.  No bowel obstruction or focal inflammation. Cycle 4 FOLFIRINOX 08/06/2021, Uden[facility]ca Cycle 5 FOLFIRINOX 08/20/2021, Uden[facility] Cycle 6 FOLFIRINOX 09/04/2021, Uden[facility] Cycle 7 FOLFIRINOX 09/17/2021, Udenyca Cycle 8 FOLFIRINOX 10/01/2021, Udenyca CTs at [facility] 10/16/2021-decrease size of pancreas uncinate mass with similar stranding adjacent to the celiac axis/hepatic takeoff, new linear band of hypoattenuation in the left liver Obstructive jaundice secondary #1 Hypertension Admission in February 2022 with elevated liver enzymes MRI/MRCP-intra and extrahepatic biliary duct  dilation, tapering smoothly to the ampulla, no mass or filling defect ERCP 10/06/2020-dilated main bile duct, biliary tree was swept and nothing was found, cytology from bile duct brushing-no  malignant cells identified 5.  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  GERD 7.  Squamous cell carcinoma left forearm 2014 8.  Hyperlipidemia 9.  CT chest 08/01/2021, done in the emergency department to evaluate pleuritic chest pain-no PE.  Advanced three-vessel coronary vascular calcification. 10.  Fever of unknown origin beginning 08/14/2021-blood and urine cultures negative, no source for infection identified, placed on antibiotic prophylaxis beginning 08/17/2021, recurrent fever 10/09/2021-cultures negative, completed a course of Levaquin     Disposition: David Jarvis has completed 8 cycles of neoadjuvant FOLFIRINOX.  David Jarvis has tolerated the chemotherapy well.  The restaging CT reveals no evidence of disease progression.  David Jarvis is being scheduled for surgery at [facility] in March.  I will see him after surgery to review the final pathology and recommend adjuvant treatment options.  The etiology of the recurrent high fever is unclear.  No source for infection has been identified.  The Port-A-Cath does not appear infected.  David Jarvis will call for recurrent fever.    Betsy Coder, MD  10/22/2021  11:58 AM

## 2021-10-30 ENCOUNTER — Other Ambulatory Visit (HOSPITAL_BASED_OUTPATIENT_CLINIC_OR_DEPARTMENT_OTHER): Payer: Self-pay

## 2021-11-01 ENCOUNTER — Other Ambulatory Visit (HOSPITAL_BASED_OUTPATIENT_CLINIC_OR_DEPARTMENT_OTHER): Payer: Self-pay

## 2021-11-09 ENCOUNTER — Encounter: Payer: Self-pay | Admitting: *Deleted

## 2021-11-09 NOTE — Progress Notes (Signed)
PATIENT NAVIGATOR PROGRESS NOTE ? ?Name: David Jarvis ?Date: 11/09/2021 ?MRN: [id number]  ?DOB: [birth year] ? ? ?Reason for visit:  ?Phone call to report episodes of diarrhea this week ? ?Comments:  Patient called to report two episodes of nausea, no vomiting but watery diarrhea on Tuesday and Thursday this week.  The episode of nausea lasts about 12 hours and resolves without nausea med.  After diarrhea patient took 1 tablet of Lomotil with results.  Describes diarrhea bowel movement as watery, light brown.  No pain but abdominal tenderness. Normal BM on Wednesday. No fever ? ?He is going to [facility] on Monday for pre op, encouraged him to push oral fluids, chart bowel movements over the weekend and to follow a "gentle" diet and report to [facility] the results. He can also call after hours number to [facility] if he has any significant issues over the weekend ? ? ? ?Time spent counseling/coordinating care: 30-45 minutes ? ?

## 2021-11-12 DIAGNOSIS — Z01818 Encounter for other preprocedural examination: Secondary | ICD-10-CM | POA: Diagnosis not present

## 2021-11-12 DIAGNOSIS — R7401 Elevation of levels of liver transaminase levels: Secondary | ICD-10-CM | POA: Diagnosis not present

## 2021-11-12 DIAGNOSIS — K219 Gastro-esophageal reflux disease without esophagitis: Secondary | ICD-10-CM | POA: Diagnosis not present

## 2021-11-12 DIAGNOSIS — R634 Abnormal weight loss: Secondary | ICD-10-CM | POA: Diagnosis not present

## 2021-11-12 DIAGNOSIS — N400 Enlarged prostate without lower urinary tract symptoms: Secondary | ICD-10-CM | POA: Diagnosis not present

## 2021-11-12 DIAGNOSIS — E785 Hyperlipidemia, unspecified: Secondary | ICD-10-CM | POA: Diagnosis not present

## 2021-11-12 DIAGNOSIS — I10 Essential (primary) hypertension: Secondary | ICD-10-CM | POA: Diagnosis not present

## 2021-11-12 DIAGNOSIS — E039 Hypothyroidism, unspecified: Secondary | ICD-10-CM | POA: Diagnosis not present

## 2021-11-12 DIAGNOSIS — C250 Malignant neoplasm of head of pancreas: Secondary | ICD-10-CM | POA: Diagnosis not present

## 2021-11-15 DIAGNOSIS — I10 Essential (primary) hypertension: Secondary | ICD-10-CM | POA: Diagnosis not present

## 2021-11-15 DIAGNOSIS — E039 Hypothyroidism, unspecified: Secondary | ICD-10-CM | POA: Diagnosis not present

## 2021-11-15 DIAGNOSIS — Z8507 Personal history of malignant neoplasm of pancreas: Secondary | ICD-10-CM | POA: Diagnosis not present

## 2021-11-15 DIAGNOSIS — Z8616 Personal history of COVID-19: Secondary | ICD-10-CM | POA: Diagnosis not present

## 2021-11-15 DIAGNOSIS — D378 Neoplasm of uncertain behavior of other specified digestive organs: Secondary | ICD-10-CM | POA: Diagnosis not present

## 2021-11-15 DIAGNOSIS — K811 Chronic cholecystitis: Secondary | ICD-10-CM | POA: Diagnosis not present

## 2021-11-15 DIAGNOSIS — N401 Enlarged prostate with lower urinary tract symptoms: Secondary | ICD-10-CM | POA: Diagnosis not present

## 2021-11-15 DIAGNOSIS — Z9221 Personal history of antineoplastic chemotherapy: Secondary | ICD-10-CM | POA: Diagnosis not present

## 2021-11-15 DIAGNOSIS — G8918 Other acute postprocedural pain: Secondary | ICD-10-CM | POA: Diagnosis not present

## 2021-11-15 DIAGNOSIS — K8689 Other specified diseases of pancreas: Secondary | ICD-10-CM | POA: Diagnosis not present

## 2021-11-15 DIAGNOSIS — K2950 Unspecified chronic gastritis without bleeding: Secondary | ICD-10-CM | POA: Diagnosis not present

## 2021-11-15 DIAGNOSIS — K863 Pseudocyst of pancreas: Secondary | ICD-10-CM | POA: Diagnosis not present

## 2021-11-15 DIAGNOSIS — D490 Neoplasm of unspecified behavior of digestive system: Secondary | ICD-10-CM | POA: Diagnosis not present

## 2021-11-15 DIAGNOSIS — Z20822 Contact with and (suspected) exposure to covid-19: Secondary | ICD-10-CM | POA: Diagnosis not present

## 2021-11-15 DIAGNOSIS — E785 Hyperlipidemia, unspecified: Secondary | ICD-10-CM | POA: Diagnosis not present

## 2021-11-15 DIAGNOSIS — R338 Other retention of urine: Secondary | ICD-10-CM | POA: Diagnosis not present

## 2021-11-15 DIAGNOSIS — Z888 Allergy status to other drugs, medicaments and biological substances status: Secondary | ICD-10-CM | POA: Diagnosis not present

## 2021-11-16 DIAGNOSIS — G8918 Other acute postprocedural pain: Secondary | ICD-10-CM | POA: Diagnosis not present

## 2021-11-17 DIAGNOSIS — G8918 Other acute postprocedural pain: Secondary | ICD-10-CM | POA: Diagnosis not present

## 2021-11-23 DIAGNOSIS — C250 Malignant neoplasm of head of pancreas: Secondary | ICD-10-CM | POA: Diagnosis not present

## 2021-11-26 ENCOUNTER — Encounter: Payer: Self-pay | Admitting: *Deleted

## 2021-11-26 NOTE — Progress Notes (Signed)
PATIENT NAVIGATOR PROGRESS NOTE ? ?Name: David Jarvis ?Date: 11/26/2021 ?MRN: [id number]  ?DOB: 20-Nov-1950 ? ? ?Reason for visit:  ?Post op telephone visit ? ?Comments:  Spoke with [name] regarding post op from surgery at [facility] on 3/16, he stated that he was doing well, walking 3/4 mile daily, eating and drinking well. Moving bowels daily with miralax.   ?Will have follow up this week with Dr Mariah Milling to review pathology from surgery. He will have F/U with Dr Benay Spice on 12/06/21 to discuss adjuvant treatment options ?Appreciated call ? ? ?Time spent counseling/coordinating care: 30-45 minutes ? ?

## 2021-11-30 ENCOUNTER — Encounter: Payer: Self-pay | Admitting: *Deleted

## 2021-11-30 NOTE — Progress Notes (Signed)
PATIENT NAVIGATOR PROGRESS NOTE ? ?Name: David Jarvis ?Date: 11/30/2021 ?MRN: [id number]  ?DOB: 1951-02-09 ? ? ?Reason for visit:  ?Received telephone call ? ?Comments:  Patient called to report of slight fever yesterday evening of 99.5 F, stated that he was dressed warmly and covered with blanket. Fever resolved after taking Tylenol. Pt reports taking Tylenol 2-3 times a day for post surgical pain.  ?Instructed him to call if fever is greater than 100.5 and to take temperature prior to taking tylenol dose to prevent masking of fever. He describes no pain, nausea, urinary symptoms or anything out of the norm.  ? ?Encouraged to call with any new symptoms or issues ? ? ?Time spent counseling/coordinating care: 30-45 minutes ? ?

## 2021-12-02 ENCOUNTER — Encounter (HOSPITAL_BASED_OUTPATIENT_CLINIC_OR_DEPARTMENT_OTHER): Payer: Self-pay

## 2021-12-02 ENCOUNTER — Other Ambulatory Visit: Payer: Self-pay

## 2021-12-02 ENCOUNTER — Emergency Department (HOSPITAL_BASED_OUTPATIENT_CLINIC_OR_DEPARTMENT_OTHER): Payer: Medicare HMO | Admitting: Radiology

## 2021-12-02 ENCOUNTER — Emergency Department (HOSPITAL_BASED_OUTPATIENT_CLINIC_OR_DEPARTMENT_OTHER): Payer: Medicare HMO

## 2021-12-02 ENCOUNTER — Emergency Department (HOSPITAL_BASED_OUTPATIENT_CLINIC_OR_DEPARTMENT_OTHER)
Admission: EM | Admit: 2021-12-02 | Discharge: 2021-12-02 | Disposition: A | Discharge status: Other Acute Inpatient Hospital | Payer: Medicare HMO | Attending: Emergency Medicine | Admitting: Emergency Medicine

## 2021-12-02 DIAGNOSIS — R509 Fever, unspecified: Secondary | ICD-10-CM

## 2021-12-02 DIAGNOSIS — K8689 Other specified diseases of pancreas: Secondary | ICD-10-CM | POA: Diagnosis not present

## 2021-12-02 DIAGNOSIS — Z7982 Long term (current) use of aspirin: Secondary | ICD-10-CM | POA: Insufficient documentation

## 2021-12-02 DIAGNOSIS — Z8507 Personal history of malignant neoplasm of pancreas: Secondary | ICD-10-CM | POA: Diagnosis not present

## 2021-12-02 DIAGNOSIS — J9811 Atelectasis: Secondary | ICD-10-CM | POA: Diagnosis not present

## 2021-12-02 DIAGNOSIS — A419 Sepsis, unspecified organism: Secondary | ICD-10-CM | POA: Diagnosis not present

## 2021-12-02 DIAGNOSIS — I700 Atherosclerosis of aorta: Secondary | ICD-10-CM | POA: Diagnosis not present

## 2021-12-02 DIAGNOSIS — Z20822 Contact with and (suspected) exposure to covid-19: Secondary | ICD-10-CM | POA: Insufficient documentation

## 2021-12-02 DIAGNOSIS — Z90410 Acquired total absence of pancreas: Secondary | ICD-10-CM

## 2021-12-02 DIAGNOSIS — R000 Tachycardia, unspecified: Secondary | ICD-10-CM | POA: Diagnosis not present

## 2021-12-02 LAB — RESP PANEL BY RT-PCR (FLU A&B, COVID) ARPGX2
Influenza A by PCR: NEGATIVE
Influenza B by PCR: NEGATIVE
SARS Coronavirus 2 by RT PCR: NEGATIVE

## 2021-12-02 LAB — COMPREHENSIVE METABOLIC PANEL
ALT: 102 U/L — ABNORMAL HIGH (ref 0–44)
AST: 184 U/L — ABNORMAL HIGH (ref 15–41)
Albumin: 3.8 g/dL (ref 3.5–5.0)
Alkaline Phosphatase: 364 U/L — ABNORMAL HIGH (ref 38–126)
Anion gap: 12 (ref 5–15)
BUN: 16 mg/dL (ref 8–23)
CO2: 22 mmol/L (ref 22–32)
Calcium: 8.7 mg/dL — ABNORMAL LOW (ref 8.9–10.3)
Chloride: 101 mmol/L (ref 98–111)
Creatinine, Ser: 1.04 mg/dL (ref 0.61–1.24)
GFR, Estimated: >60 mL/min (ref >60)
Glucose, Bld: 108 mg/dL — ABNORMAL HIGH (ref 70–99)
Potassium: 3.7 mmol/L (ref 3.5–5.1)
Sodium: 135 mmol/L (ref 135–145)
Total Bilirubin: 0.6 mg/dL (ref 0.3–1.2)
Total Protein: 6.5 g/dL (ref 6.5–8.1)

## 2021-12-02 LAB — URINALYSIS, ROUTINE W REFLEX MICROSCOPIC
Bilirubin Urine: NEGATIVE
Glucose, UA: NEGATIVE mg/dL
Hgb urine dipstick: NEGATIVE
Ketones, ur: 40 mg/dL — AB
Leukocytes,Ua: NEGATIVE
Nitrite: NEGATIVE
Protein, ur: 30 mg/dL — AB
Specific Gravity, Urine: 1.034 — ABNORMAL HIGH (ref 1.005–1.030)
pH: 6 (ref 5.0–8.0)

## 2021-12-02 LAB — CBC WITH DIFFERENTIAL/PLATELET
Abs Immature Granulocytes: 0.02 10*3/uL (ref 0.00–0.07)
Basophils Absolute: 0 10*3/uL (ref 0.0–0.1)
Basophils Relative: 1 %
Eosinophils Absolute: 0 10*3/uL (ref 0.0–0.5)
Eosinophils Relative: 0 %
HCT: 34.7 % — ABNORMAL LOW (ref 39.0–52.0)
Hemoglobin: 11.3 g/dL — ABNORMAL LOW (ref 13.0–17.0)
Immature Granulocytes: 0 %
Lymphocytes Relative: 6 %
Lymphs Abs: 0.4 10*3/uL — ABNORMAL LOW (ref 0.7–4.0)
MCH: 28.5 pg (ref 26.0–34.0)
MCHC: 32.6 g/dL (ref 30.0–36.0)
MCV: 87.4 fL (ref 80.0–100.0)
Monocytes Absolute: 0.6 10*3/uL (ref 0.1–1.0)
Monocytes Relative: 9 %
Neutro Abs: 5.5 10*3/uL (ref 1.7–7.7)
Neutrophils Relative %: 84 %
Platelets: 216 10*3/uL (ref 150–400)
RBC: 3.97 MIL/uL — ABNORMAL LOW (ref 4.22–5.81)
RDW: 13.4 % (ref 11.5–15.5)
WBC: 6.6 10*3/uL (ref 4.0–10.5)
nRBC: 0 % (ref 0.0–0.2)

## 2021-12-02 LAB — PROTIME-INR
INR: 1.1 (ref 0.8–1.2)
Prothrombin Time: 14 seconds (ref 11.4–15.2)

## 2021-12-02 LAB — LACTIC ACID, PLASMA
Lactic Acid, Venous: 0.7 mmol/L (ref 0.5–1.9)
Lactic Acid, Venous: 1.1 mmol/L (ref 0.5–1.9)

## 2021-12-02 LAB — CBG MONITORING, ED: Glucose-Capillary: 88 mg/dL (ref 70–99)

## 2021-12-02 LAB — LIPASE, BLOOD: Lipase: 13 U/L (ref 11–51)

## 2021-12-02 MED ORDER — VANCOMYCIN HCL IN DEXTROSE 1-5 GM/200ML-% IV SOLN
1000.0000 mg | Freq: Once | INTRAVENOUS | Status: AC
  Administered 2021-12-02: 1000 mg via INTRAVENOUS
  Filled 2021-12-02: qty 200

## 2021-12-02 MED ORDER — VANCOMYCIN HCL 1250 MG/250ML IV SOLN
1250.0000 mg | INTRAVENOUS | Status: DC
  Filled 2021-12-02: qty 250

## 2021-12-02 MED ORDER — IOHEXOL 300 MG/ML  SOLN
100.0000 mL | Freq: Once | INTRAMUSCULAR | Status: AC | PRN
  Administered 2021-12-02: 100 mL via INTRAVENOUS

## 2021-12-02 MED ORDER — LACTATED RINGERS IV SOLN: INTRAVENOUS | Status: DC

## 2021-12-02 MED ORDER — ACETAMINOPHEN 325 MG PO TABS
650.0000 mg | ORAL_TABLET | Freq: Once | ORAL | Status: AC
Start: 2021-12-02 — End: 2021-12-02
  Administered 2021-12-02: 650 mg via ORAL
  Filled 2021-12-02: qty 2

## 2021-12-02 MED ORDER — LACTATED RINGERS IV BOLUS (SEPSIS)
1000.0000 mL | Freq: Once | INTRAVENOUS | Status: AC
  Administered 2021-12-02: 1000 mL via INTRAVENOUS

## 2021-12-02 MED ORDER — SODIUM CHLORIDE 0.9 % IV SOLN
2.0000 g | Freq: Once | INTRAVENOUS | Status: AC
  Administered 2021-12-02: 2 g via INTRAVENOUS
  Filled 2021-12-02: qty 2

## 2021-12-02 MED ORDER — CIPROFLOXACIN HCL 500 MG PO TABS: 500.0000 mg | ORAL_TABLET | Freq: Two times a day (BID) | ORAL | 0 refills | Status: DC

## 2021-12-02 MED ORDER — METRONIDAZOLE 500 MG PO TABS: 500.0000 mg | ORAL_TABLET | Freq: Three times a day (TID) | ORAL | 0 refills | Status: DC

## 2021-12-02 MED ORDER — VANCOMYCIN HCL 500 MG IV SOLR
500.0000 mg | Freq: Once | INTRAVENOUS | Status: AC
  Administered 2021-12-02: 500 mg via INTRAVENOUS

## 2021-12-02 MED ORDER — LACTATED RINGERS IV BOLUS (SEPSIS)
250.0000 mL | Freq: Once | INTRAVENOUS | Status: AC
  Administered 2021-12-02: 250 mL via INTRAVENOUS

## 2021-12-02 MED ORDER — SODIUM CHLORIDE 0.9 % IV SOLN
2.0000 g | Freq: Three times a day (TID) | INTRAVENOUS | Status: DC
  Administered 2021-12-02: 2 g via INTRAVENOUS
  Filled 2021-12-02: qty 2

## 2021-12-02 MED ORDER — METRONIDAZOLE 500 MG/100ML IV SOLN
500.0000 mg | Freq: Once | INTRAVENOUS | Status: AC
  Administered 2021-12-02: 500 mg via INTRAVENOUS
  Filled 2021-12-02: qty 100

## 2021-12-02 NOTE — Sepsis Progress Note (Signed)
Sepsis protocol is being followed by [facility]. 

## 2021-12-02 NOTE — ED Provider Notes (Addendum)
?[facility] EMERGENCY DEPT ?Provider Note ? ? ?CSN: [id number] ?Arrival date & time: 12/02/21  1058 ? ?  ? ?History ? ?Chief Complaint  ?Patient presents with  ? Chills  ? ? ?David Jarvis is a 71 y.o. male. ? ?HPI ? ?71 year old male with medical history significant for pancreatic adenocarcinoma status post neoadjuvant chemotherapy currently over 2 weeks postoperative from a Whipple procedure with Dr. Mariah Milling at [facility] who presents to the emergency department with fever, chills and rigors.  The patient states that he had been doing well since his surgery.  He had an episode of rigors yesterday which lasted for around 30 minutes.  He has had low-grade temperature at home with chills since this past Thursday evening.  He is tolerating oral intake and has had no nausea or vomiting.  He denies any diarrhea.  He denies any abdominal pain or skin changes.  He denies any headache or neck stiffness.  He denies any cough, chest pain or shortness of breath. ? ?Home Medications ?Prior to Admission medications   ?Medication Sig Start Date End Date Taking? Authorizing Provider  ?aspirin EC 81 MG tablet Take 81 mg by mouth daily.    [provider]  ?atorvastatin (LIPITOR) 10 MG tablet Take 1 tablet by mouth daily ?Patient not taking: Reported on 08/06/2021 05/30/21     ?diphenoxylate-atropine (LOMOTIL) 2.5-0.025 MG tablet Take 1 tablet by mouth 4 (four) times daily as needed for diarrhea or loose stools. 06/27/21   Owens Shark, NP  ?hydrocortisone (ANUSOL-HC) 2.5 % rectal cream 1 application ?Patient not taking: Reported on 10/01/2021 08/29/21   [provider]  ?levothyroxine (SYNTHROID) 25 MCG tablet Take 1 tablet by mouth daily before breakfast 06/06/21     ?lidocaine-prilocaine (EMLA) cream Apply 1 application topically as needed. ?Patient not taking: Reported on 10/11/2021 06/06/21   Ladell Pier, MD  ?LORazepam (ATIVAN) 0.5 MG tablet Place 1 tablet (0.5 mg total) under the tongue every  8 (eight) hours as needed (for nausea). ?Patient not taking: Reported on 10/01/2021 06/26/21   Owens Shark, NP  ?magnesium oxide (MAG-OX) 400 (240 Mg) MG tablet Take 1 tablet (400 mg total) by mouth daily. 09/18/21   Ladell Pier, MD  ?ondansetron (ZOFRAN) 8 MG tablet Take 1 tablet (8 mg total) by mouth 2 (two) times daily. 06/06/21   Ladell Pier, MD  ?pantoprazole (PROTONIX) 40 MG tablet TAKE ONE TABLET BY MOUTH EVERY DAY 90 days ?Patient not taking: Reported on 10/11/2021 07/31/21     ?potassium chloride SA (KLOR-CON M) 20 MEQ tablet Take 1 tab twice a day for 3 days, then 1 tab daily 09/18/21   Ladell Pier, MD  ?prochlorperazine (COMPAZINE) 10 MG tablet Take 1 tablet (10 mg total) by mouth every 6 (six) hours as needed for nausea or vomiting. ?Patient not taking: Reported on 10/01/2021 06/06/21   Ladell Pier, MD  ?tamsulosin [facility]) 0.4 MG CAPS capsule Take 1 capsule by mouth once daily 03/08/21     ?valsartan-hydrochlorothiazide (DIOVAN-HCT) 80-12.5 MG tablet Take 1/2 tablet by mouth once daily *please half* ?Patient not taking: Reported on 10/01/2021 03/08/21     ?nitroGLYCERIN (NITROGLYN) 2 % OINT ointment Apply pea size amount to hemorrhoid twice daily for 14 days. 08/29/21 08/30/21    ?   ? ?Allergies    ?Ace inhibitors and Lisinopril   ? ?Review of Systems   ?Review of Systems  ?Constitutional:  Positive for chills and fever.  ?All other  systems reviewed and are negative. ? ?Physical Exam ?Updated Vital Signs ?BP 111/69   Pulse 64   Temp (!) 101.8 ?F (38.8 ?C) (Oral)   Resp 11   SpO2 97%  ?Physical Exam ?Vitals and nursing note reviewed.  ?Constitutional:   ?   General: He is not in acute distress. ?   Appearance: He is well-developed. He is not ill-appearing.  ?HENT:  ?   Head: Normocephalic and atraumatic.  ?Eyes:  ?   Conjunctiva/sclera: Conjunctivae normal.  ?Neck:  ?   Comments: No nuchal rigidity ?Cardiovascular:  ?   Rate and Rhythm: Normal rate and regular rhythm.  ?Pulmonary:  ?    Effort: Pulmonary effort is normal. No respiratory distress.  ?   Breath sounds: Normal breath sounds.  ?Abdominal:  ?   Palpations: Abdomen is soft.  ?   Tenderness: There is no abdominal tenderness.  ?   Comments: Postoperative robotic incision scars appear to be well-healing without erythema or drainage.  No tenderness to palpation over the right upper quadrant or epigastrium.  ?Musculoskeletal:     ?   General: No swelling.  ?   Cervical back: Normal range of motion and neck supple.  ?Skin: ?   General: Skin is warm and dry.  ?   Capillary Refill: Capillary refill takes less than 2 seconds.  ?Neurological:  ?   Mental Status: He is alert.  ?Psychiatric:     ?   Mood and Affect: Mood normal.  ? ? ?ED Results / Procedures / Treatments   ?Labs ?(all labs ordered are listed, but only abnormal results are displayed) ?Labs Reviewed  ?COMPREHENSIVE METABOLIC PANEL - Abnormal; Notable for the following components:  ?    Result Value  ? Glucose, Bld 108 (*)   ? Calcium 8.7 (*)   ? AST 184 (*)   ? ALT 102 (*)   ? Alkaline Phosphatase 364 (*)   ? All other components within normal limits  ?CBC WITH DIFFERENTIAL/PLATELET - Abnormal; Notable for the following components:  ? RBC 3.97 (*)   ? Hemoglobin 11.3 (*)   ? HCT 34.7 (*)   ? Lymphs Abs 0.4 (*)   ? All other components within normal limits  ?URINALYSIS, ROUTINE W REFLEX MICROSCOPIC - Abnormal; Notable for the following components:  ? Specific Gravity, Urine 1.034 (*)   ? Ketones, ur 40 (*)   ? Protein, ur 30 (*)   ? Bacteria, UA RARE (*)   ? Non Squamous Epithelial 0-5 (*)   ? All other components within normal limits  ?RESP PANEL BY RT-PCR (FLU A&B, COVID) [id number]  ?CULTURE, BLOOD (ROUTINE X 2)  ?CULTURE, BLOOD (ROUTINE X 2)  ?URINE CULTURE  ?LACTIC ACID, PLASMA  ?LACTIC ACID, PLASMA  ?PROTIME-INR  ?LIPASE, BLOOD  ? ? ?EKG ?EKG Interpretation ? ?Date/Time:  Sunday December 02 2021 11:16:59 EDT ?Ventricular Rate:  102 ?PR Interval:  123 ?QRS Duration: 95 ?QT  Interval:  335 ?QTC Calculation: 437 ?R Axis:   63 ?Text Interpretation: Sinus tachycardia Confirmed by Regan Lemming ([id number]) on 12/02/2021 11:43:11 AM ? ?Radiology ?DG Chest 2 View ? ?Result Date: 12/02/2021 ?CLINICAL DATA:  Suspected sepsis.  History of pancreatic cancer. EXAM: CHEST - 2 VIEW COMPARISON:  August 14, 2021 FINDINGS: Port-A-Cath is stable. No pneumothorax. Mild opacity in left base is platelike in appearance, likely atelectasis. Linear opacity in the right base is consistent with atelectasis. No suspicious infiltrates otherwise identified. No nodules or masses. No change in the  cardiomediastinal silhouette. IMPRESSION: Bilateral subsegmental atelectasis. No evidence of pneumonia or cause for fever identified. Electronically Signed   By: Dorise Bullion III M.D.   On: 12/02/2021 12:10  ? ?CT ABDOMEN PELVIS W CONTRAST ? ?Result Date: 12/02/2021 ?CLINICAL DATA:  Sepsis.  Status post Whipple 3 weeks ago. EXAM: CT ABDOMEN AND PELVIS WITH CONTRAST TECHNIQUE: Multidetector CT imaging of the abdomen and pelvis was performed using the standard protocol following bolus administration of intravenous contrast. RADIATION DOSE REDUCTION: This exam was performed according to the departmental dose-optimization program which includes automated exposure control, adjustment of the mA and/or kV according to patient size and/or use of iterative reconstruction technique. CONTRAST:  115m OMNIPAQUE IOHEXOL 300 MG/ML  SOLN COMPARISON:  08/01/2021 FINDINGS: Lower chest: Scar versus subsegmental atelectasis noted in the lung bases. No pleural effusion or airspace consolidation. Hepatobiliary: No suspicious liver lesion identified. Pneumobilia is identified consistent with biliary patency. Pancreas: Postsurgical change from recent Whipple procedure is identified. There is a stent which extends from the pancreaticojejunostomy into the main duct to the level of the mid tail. Atrophy of the body and tail of pancreas. No signs of edema  or inflammation involving the remaining portions of the pancreas. Spleen: Normal in size without focal abnormality. Adrenals/Urinary Tract: Normal appearance of the adrenal glands. No kidney mass, hydronephrosis, or nephr

## 2021-12-02 NOTE — ED Notes (Signed)
Report given to [facility]. ?

## 2021-12-02 NOTE — Progress Notes (Signed)
Pharmacy Antibiotic Note ? ?David Jarvis is a 71 y.o. male admitted on 12/02/2021 presenting with rigors s/p whipple procedure.  Pharmacy has been consulted for vancomycin and cefepime dosing. ? ?Plan: ?Vancomycin 1500 IV x 1, then 1250 mg IV q 24h (eAUC 454, Goal AUC 400-550, SCr 1.04) ?Cefepime 2g IV every 8 hours ?Monitor renal function, Cx and clinical progression to narrow ?Vancomycin levels as needed ? ?  ? ?Temp (24hrs), Avg:101.8 ?F (38.8 ?C), Min:101.8 ?F (38.8 ?C), Max:101.8 ?F (38.8 ?C) ? ?Recent Labs  ?Lab 12/02/21 ?1216  ?WBC 6.6  ?CREATININE 1.04  ?LATICACIDVEN 0.7  ?  ?CrCl cannot be calculated (Unknown ideal weight.).   ? ?Allergies  ?Allergen Reactions  ? Ace Inhibitors Cough  ? Lisinopril Cough  ? ? ?Bertis Ruddy, PharmD ?Clinical Pharmacist ?ED Pharmacist Phone # [phone] ?12/02/2021 1:45 PM ? ?

## 2021-12-02 NOTE — ED Triage Notes (Signed)
He tells me that he underwent a wipple surgical procedure at [facility] some 3 weeks ago, after which he was hospitalized for 3 days. He tells me he has done well since then until he had an episode of rigors yesterday which lasted for ~ 30 min. He had another episode of rigors this morning, became concerned and came is to be examined. He is ambulatory and in no distress. ?

## 2021-12-02 NOTE — ED Notes (Signed)
Report given to the Charge RN at [facility] ED. ?

## 2021-12-02 NOTE — Discharge Instructions (Addendum)
Your work-up was largely unremarkable.  Your vital signs on reassessment stabilized with improvement in heart rate after fluid resuscitation.  I discussed your case with your surgeon at [facility], Dr. Mariah Milling who recommended starting you on ciprofloxacin and Flagyl and following up in clinic on Tuesday.  If for any reason your condition worsens, return immediately to the nearest emergency department for admission for IV antibiotics. ? ?CT Results: ?IMPRESSION: ?1. Postoperative changes from recent Whipple procedure is ?identified. There is edema with surrounding soft tissue stranding ?involving the proximal jejunum at the level of the choledocho ?jejunostomy and pancreaticojejunostomy, favored to represent early ?postoperative change. There are no specific findings identified to ?suggest anastomotic dehiscence. ?2. No evidence for bowel obstruction or bowel obstruction. ?3. Aortic Atherosclerosis (ICD10-I70.0). ?

## 2021-12-03 ENCOUNTER — Telehealth: Payer: Self-pay

## 2021-12-03 DIAGNOSIS — R7303 Prediabetes: Secondary | ICD-10-CM | POA: Diagnosis not present

## 2021-12-03 DIAGNOSIS — E039 Hypothyroidism, unspecified: Secondary | ICD-10-CM | POA: Diagnosis not present

## 2021-12-03 DIAGNOSIS — C250 Malignant neoplasm of head of pancreas: Secondary | ICD-10-CM | POA: Diagnosis not present

## 2021-12-03 DIAGNOSIS — R509 Fever, unspecified: Secondary | ICD-10-CM | POA: Diagnosis not present

## 2021-12-03 DIAGNOSIS — K8689 Other specified diseases of pancreas: Secondary | ICD-10-CM | POA: Diagnosis not present

## 2021-12-03 DIAGNOSIS — I959 Hypotension, unspecified: Secondary | ICD-10-CM | POA: Diagnosis not present

## 2021-12-03 DIAGNOSIS — I10 Essential (primary) hypertension: Secondary | ICD-10-CM | POA: Diagnosis not present

## 2021-12-03 DIAGNOSIS — M549 Dorsalgia, unspecified: Secondary | ICD-10-CM | POA: Diagnosis not present

## 2021-12-03 DIAGNOSIS — Z9889 Other specified postprocedural states: Secondary | ICD-10-CM | POA: Diagnosis not present

## 2021-12-03 DIAGNOSIS — R531 Weakness: Secondary | ICD-10-CM | POA: Diagnosis not present

## 2021-12-03 NOTE — Telephone Encounter (Signed)
Called and spoke with the patient to check on him, the patient was admitted to [facility] on 12/02/21 for a fever.  ?

## 2021-12-05 LAB — URINE CULTURE: Culture: 60000 — AB

## 2021-12-06 ENCOUNTER — Other Ambulatory Visit: Payer: Self-pay

## 2021-12-06 ENCOUNTER — Inpatient Hospital Stay: Payer: Medicare HMO | Attending: Oncology | Admitting: Oncology

## 2021-12-06 ENCOUNTER — Other Ambulatory Visit (HOSPITAL_BASED_OUTPATIENT_CLINIC_OR_DEPARTMENT_OTHER): Payer: Self-pay

## 2021-12-06 VITALS — BP 118/78 | HR 74 | Temp 97.8°F | Resp 18 | Ht 66.0 in | Wt 147.2 lb

## 2021-12-06 DIAGNOSIS — E785 Hyperlipidemia, unspecified: Secondary | ICD-10-CM | POA: Insufficient documentation

## 2021-12-06 DIAGNOSIS — Z5189 Encounter for other specified aftercare: Secondary | ICD-10-CM | POA: Diagnosis not present

## 2021-12-06 DIAGNOSIS — B952 Enterococcus as the cause of diseases classified elsewhere: Secondary | ICD-10-CM | POA: Diagnosis not present

## 2021-12-06 DIAGNOSIS — K219 Gastro-esophageal reflux disease without esophagitis: Secondary | ICD-10-CM | POA: Diagnosis not present

## 2021-12-06 DIAGNOSIS — R509 Fever, unspecified: Secondary | ICD-10-CM | POA: Insufficient documentation

## 2021-12-06 DIAGNOSIS — B958 Unspecified staphylococcus as the cause of diseases classified elsewhere: Secondary | ICD-10-CM | POA: Insufficient documentation

## 2021-12-06 DIAGNOSIS — I10 Essential (primary) hypertension: Secondary | ICD-10-CM | POA: Insufficient documentation

## 2021-12-06 DIAGNOSIS — N390 Urinary tract infection, site not specified: Secondary | ICD-10-CM | POA: Diagnosis not present

## 2021-12-06 DIAGNOSIS — C250 Malignant neoplasm of head of pancreas: Secondary | ICD-10-CM | POA: Insufficient documentation

## 2021-12-06 DIAGNOSIS — Z9221 Personal history of antineoplastic chemotherapy: Secondary | ICD-10-CM | POA: Diagnosis not present

## 2021-12-06 DIAGNOSIS — Z5111 Encounter for antineoplastic chemotherapy: Secondary | ICD-10-CM | POA: Insufficient documentation

## 2021-12-06 DIAGNOSIS — C772 Secondary and unspecified malignant neoplasm of intra-abdominal lymph nodes: Secondary | ICD-10-CM | POA: Insufficient documentation

## 2021-12-06 MED ORDER — POTASSIUM CHLORIDE CRYS ER 20 MEQ PO TBCR
EXTENDED_RELEASE_TABLET | ORAL | 1 refills | Status: DC
  Filled 2021-12-06: qty 30, 30d supply, fill #0
  Filled 2022-01-16: qty 30, 30d supply, fill #1

## 2021-12-06 MED ORDER — MAGNESIUM OXIDE -MG SUPPLEMENT 400 (240 MG) MG PO TABS
400.0000 mg | ORAL_TABLET | Freq: Every day | ORAL | 0 refills | Status: DC
  Filled 2021-12-06: qty 120, 120d supply, fill #0

## 2021-12-06 NOTE — Progress Notes (Signed)
?[place] ?OFFICE PROGRESS NOTE ? ? ?Diagnosis: Pancreas cancer ? ?INTERVAL HISTORY:  ? ?David Jarvis underwent a robotic assisted laparoscopic pancreatectomy on 11/15/2021.  There was no evidence of peritoneal or hepatic disease.  He was discharged from the hospital 11/18/2021. ? ?He presented with a fever on 12/02/2021.  He was initially seen in the emergency room here with chills and a fever.  And transferred to [facility].  He was admitted for an observation stay.  No source for infection was identified.  He was discharged on a course of Augmentin.  He is scheduled for outpatient follow-up at [facility] on 12/11/2021. ?A urinalysis on 12/02/2021 revealed clumps of white cells and rare bacteria.  A culture was positive for Staphylococcus epidermidis and Enterococcus. ?He denies recurrent fever or chills.  He is eating.  No diarrhea.  He is exercising. ? ?The pathology from the Whipple procedure revealed no residual adenocarcinoma in the pancreas.  23 lymph nodes were negative for malignancy. ? ?Objective: ? ?Vital signs in last 24 hours: ? ?Blood pressure 118/78, pulse 74, temperature 97.8 ?F (36.6 ?C), temperature source Oral, resp. rate 18, height '5\' 6"'$  (1.676 m), weight 147 lb 3.2 oz (66.8 kg), SpO2 99 %. ?  ? ? ?Resp: Lungs clear bilaterally ?Cardio: Regular rate and rhythm ?GI: No hepatosplenomegaly, healed surgical incisions ?Vascular: No leg edema ? ?Portacath/PICC-without erythema ? ?Lab Results: ? ?Lab Results  ?Component Value Date  ? WBC 6.6 12/02/2021  ? HGB 11.3 (L) 12/02/2021  ? HCT 34.7 (L) 12/02/2021  ? MCV 87.4 12/02/2021  ? PLT 216 12/02/2021  ? NEUTROABS 5.5 12/02/2021  ? ? ?CMP  ?Lab Results  ?Component Value Date  ? NA 135 12/02/2021  ? K 3.7 12/02/2021  ? CL 101 12/02/2021  ? CO2 22 12/02/2021  ? GLUCOSE 108 (H) 12/02/2021  ? BUN 16 12/02/2021  ? CREATININE 1.04 12/02/2021  ? CALCIUM 8.7 (L) 12/02/2021  ? PROT 6.5 12/02/2021  ? ALBUMIN 3.8 12/02/2021  ? AST 184 (H) 12/02/2021  ? ALT 102 (H)  12/02/2021  ? ALKPHOS 364 (H) 12/02/2021  ? BILITOT 0.6 12/02/2021  ? GFRNONAA >60 12/02/2021  ? ? ?Lab Results  ?Component Value Date  ? ZOX096 28 10/01/2021  ? ? ?Lab Results  ?Component Value Date  ? INR 1.1 12/02/2021  ? LABPROT 14.0 12/02/2021  ? ? ?Imaging: ? ?DG Chest 2 View ? ?Result Date: 12/02/2021 ?CLINICAL DATA:  Suspected sepsis.  History of pancreatic cancer. EXAM: CHEST - 2 VIEW COMPARISON:  August 14, 2021 FINDINGS: Port-A-Cath is stable. No pneumothorax. Mild opacity in left base is platelike in appearance, likely atelectasis. Linear opacity in the right base is consistent with atelectasis. No suspicious infiltrates otherwise identified. No nodules or masses. No change in the cardiomediastinal silhouette. IMPRESSION: Bilateral subsegmental atelectasis. No evidence of pneumonia or cause for fever identified. Electronically Signed   By: Dorise Bullion III M.D.   On: 12/02/2021 12:10  ? ?CT ABDOMEN PELVIS W CONTRAST ? ?Result Date: 12/02/2021 ?CLINICAL DATA:  Sepsis.  Status post Whipple 3 weeks ago. EXAM: CT ABDOMEN AND PELVIS WITH CONTRAST TECHNIQUE: Multidetector CT imaging of the abdomen and pelvis was performed using the standard protocol following bolus administration of intravenous contrast. RADIATION DOSE REDUCTION: This exam was performed according to the departmental dose-optimization program which includes automated exposure control, adjustment of the mA and/or kV according to patient size and/or use of iterative reconstruction technique. CONTRAST:  153m OMNIPAQUE IOHEXOL 300 MG/ML  SOLN COMPARISON:  08/01/2021 FINDINGS: Lower chest: Scar versus subsegmental atelectasis noted in the lung bases. No pleural effusion or airspace consolidation. Hepatobiliary: No suspicious liver lesion identified. Pneumobilia is identified consistent with biliary patency. Pancreas: Postsurgical change from recent Whipple procedure is identified. There is a stent which extends from the pancreaticojejunostomy into  the main duct to the level of the mid tail. Atrophy of the body and tail of pancreas. No signs of edema or inflammation involving the remaining portions of the pancreas. Spleen: Normal in size without focal abnormality. Adrenals/Urinary Tract: Normal appearance of the adrenal glands. No kidney mass, hydronephrosis, or nephrolithiasis. The urinary bladder appears normal. Stomach/Bowel: Postoperative changes involving the stomach and proximal small bowel loops are identified compatible with recent Whipple procedure. There is edema with surrounding soft tissue stranding involving the proximal jejunum at the level of the choledocho jejunostomy and pancreaticojejunostomy, which is favored to represent early postoperative change. There are no specific findings identified to suggest anastomotic dehiscence. No pathologic dilatation of the large or small bowel loops to suggest bowel obstruction. Vascular/Lymphatic: Aortic atherosclerosis. No aneurysm. The upper abdominal vascularity including the portal vein, splenic vein and superior mesenteric vein remain patent. Reproductive: Prostate is unremarkable. Other: No significant free fluid. There is no pneumoperitoneum identified. There is a small volume of gas within both sides of the subcutaneous ventral abdominal wall which likely reflects residual postop change. No discrete fluid collections identified to suggest abscess or hematoma. Musculoskeletal: No acute or significant osseous findings. IMPRESSION: 1. Postoperative changes from recent Whipple procedure is identified. There is edema with surrounding soft tissue stranding involving the proximal jejunum at the level of the choledocho jejunostomy and pancreaticojejunostomy, favored to represent early postoperative change. There are no specific findings identified to suggest anastomotic dehiscence. 2. No evidence for bowel obstruction or bowel obstruction. 3. Aortic Atherosclerosis (ICD10-I70.0). Electronically Signed   By:  Kerby Moors M.D.   On: 12/02/2021 13:58   ? ?Medications: I have reviewed the patient's current medications. ? ? ?Assessment/Plan: ?Pancreas cancer, adenocarcinoma ?MRI/MRCP abdomen 05/18/2021-diffuse biliary and pancreatic duct dilation, 2.8 cm mass in the pancreas head, no evidence of metastatic disease ?ERCP 05/23/2021-localized stricture in the lower third of the main bile duct, stent placed ?EUS 05/23/2021-25 x 15 mm pancreas head mass with irregular borders, no lymphadenopathy, no vascular involvement, T2 N0 by ultrasound ?CT chest 06/01/2021-negative for metastatic disease ?CT abdomen pancreas protocol 06/01/2021-3.2 x 2.3 cm hypoenhancing pancreas head mass inseparable from the third portion of the duodenum, 1 cm left retroperitoneal lymph node, no vascular involvement ?Cycle 1 FOLFIRINOX 06/19/2021 ?Cycle 2 FOLFIRINOX 07/02/2021, irinotecan and 5-FU dose reduced, prophylactic dexamethasone added beginning day of pump discontinuation ?Cycle 3 FOLFIRINOX 07/23/2021, Udenyca ?CT abdomen/pelvis 08/01/2021-done in the emergency department to evaluate epigastric pain-pancreas head mass decreased in size.  Diffuse gaseous distention of large and small bowel.  No bowel obstruction or focal inflammation. ?Cycle 4 FOLFIRINOX 08/06/2021, Udenyca ?Cycle 5 FOLFIRINOX 08/20/2021, Udenyca ?Cycle 6 FOLFIRINOX 09/04/2021, Udenyca ?Cycle 7 FOLFIRINOX 09/17/2021, Udenyca ?Cycle 8 FOLFIRINOX 10/01/2021, Udenyca ?CTs at [facility] 10/16/2021-decrease size of pancreas uncinate mass with similar stranding adjacent to the celiac axis/hepatic takeoff, new linear band of hypoattenuation in the left liver ?Pancreaticoduodenectomy at [facility] 11/15/2021-ypT0,ypN0, no residual adenocarcinoma, 0/24 lymph nodes ?Obstructive jaundice secondary #1 ?Hypertension ?Admission in February 2022 with elevated liver enzymes ?MRI/MRCP-intra and extrahepatic biliary duct dilation, tapering smoothly to the ampulla, no mass or filling defect ?ERCP 10/06/2020-dilated main  bile duct, biliary tree was swept and nothing was found, cytology from  bile duct brushing-no malignant cells identified ?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  GERD ?7.  Squamous cell carcinoma

## 2021-12-07 LAB — CULTURE, BLOOD (ROUTINE X 2)
Culture: NO GROWTH
Culture: NO GROWTH
Special Requests: ADEQUATE
Special Requests: ADEQUATE

## 2021-12-10 ENCOUNTER — Other Ambulatory Visit (HOSPITAL_BASED_OUTPATIENT_CLINIC_OR_DEPARTMENT_OTHER): Payer: Self-pay

## 2021-12-20 ENCOUNTER — Encounter: Payer: Self-pay | Admitting: *Deleted

## 2021-12-20 NOTE — Progress Notes (Signed)
PATIENT NAVIGATOR PROGRESS NOTE ? ?Name: David Jarvis ?Date: 12/20/2021 ?MRN: [id number]  ?DOB: 1950-10-11 ? ? ?Reason for visit:  ?Tingling in fingers and toes ? ?Comments:  [name] scheduled to start adjuvant chemo next week and called to report tingling in fingers which is constant, it does improve when he gets his hands warm but does not go completely away ? ?His toes are intermittently tingling.   ? ?Will inform Dr Benay Spice and Ned Card NP, they are seeing him prior to treatment on Tuesday ? ? ? ?Time spent counseling/coordinating care: 30-45 minutes ? ?

## 2021-12-22 ENCOUNTER — Other Ambulatory Visit: Payer: Self-pay | Admitting: Oncology

## 2021-12-25 ENCOUNTER — Inpatient Hospital Stay: Payer: Medicare HMO

## 2021-12-25 ENCOUNTER — Inpatient Hospital Stay: Payer: Medicare HMO | Admitting: Nurse Practitioner

## 2021-12-25 ENCOUNTER — Encounter: Payer: Self-pay | Admitting: *Deleted

## 2021-12-25 ENCOUNTER — Encounter: Payer: Self-pay | Admitting: Nurse Practitioner

## 2021-12-25 VITALS — BP 135/82 | HR 68 | Temp 97.9°F | Resp 18 | Ht 66.0 in | Wt 147.0 lb

## 2021-12-25 VITALS — BP 117/68 | HR 60 | Temp 97.8°F | Resp 18

## 2021-12-25 DIAGNOSIS — I10 Essential (primary) hypertension: Secondary | ICD-10-CM | POA: Diagnosis not present

## 2021-12-25 DIAGNOSIS — C250 Malignant neoplasm of head of pancreas: Secondary | ICD-10-CM | POA: Diagnosis not present

## 2021-12-25 DIAGNOSIS — E785 Hyperlipidemia, unspecified: Secondary | ICD-10-CM | POA: Diagnosis not present

## 2021-12-25 DIAGNOSIS — Z9221 Personal history of antineoplastic chemotherapy: Secondary | ICD-10-CM | POA: Diagnosis not present

## 2021-12-25 DIAGNOSIS — Z5189 Encounter for other specified aftercare: Secondary | ICD-10-CM | POA: Diagnosis not present

## 2021-12-25 DIAGNOSIS — K219 Gastro-esophageal reflux disease without esophagitis: Secondary | ICD-10-CM | POA: Diagnosis not present

## 2021-12-25 DIAGNOSIS — B952 Enterococcus as the cause of diseases classified elsewhere: Secondary | ICD-10-CM | POA: Diagnosis not present

## 2021-12-25 DIAGNOSIS — R509 Fever, unspecified: Secondary | ICD-10-CM | POA: Diagnosis not present

## 2021-12-25 DIAGNOSIS — N390 Urinary tract infection, site not specified: Secondary | ICD-10-CM | POA: Diagnosis not present

## 2021-12-25 DIAGNOSIS — B958 Unspecified staphylococcus as the cause of diseases classified elsewhere: Secondary | ICD-10-CM | POA: Diagnosis not present

## 2021-12-25 DIAGNOSIS — C772 Secondary and unspecified malignant neoplasm of intra-abdominal lymph nodes: Secondary | ICD-10-CM | POA: Diagnosis not present

## 2021-12-25 DIAGNOSIS — Z5111 Encounter for antineoplastic chemotherapy: Secondary | ICD-10-CM | POA: Diagnosis not present

## 2021-12-25 LAB — CMP (CANCER CENTER ONLY)
ALT: 59 U/L — ABNORMAL HIGH (ref 0–44)
AST: 83 U/L — ABNORMAL HIGH (ref 15–41)
Albumin: 4.2 g/dL (ref 3.5–5.0)
Alkaline Phosphatase: 139 U/L — ABNORMAL HIGH (ref 38–126)
Anion gap: 10 (ref 5–15)
BUN: 15 mg/dL (ref 8–23)
CO2: 24 mmol/L (ref 22–32)
Calcium: 9.5 mg/dL (ref 8.9–10.3)
Chloride: 106 mmol/L (ref 98–111)
Creatinine: 1.05 mg/dL (ref 0.61–1.24)
GFR, Estimated: >60 mL/min (ref >60)
Glucose, Bld: 121 mg/dL — ABNORMAL HIGH (ref 70–99)
Potassium: 4 mmol/L (ref 3.5–5.1)
Sodium: 140 mmol/L (ref 135–145)
Total Bilirubin: 0.5 mg/dL (ref 0.3–1.2)
Total Protein: 6.6 g/dL (ref 6.5–8.1)

## 2021-12-25 LAB — CBC WITH DIFFERENTIAL (CANCER CENTER ONLY)
Abs Immature Granulocytes: 0.03 10*3/uL (ref 0.00–0.07)
Basophils Absolute: 0 10*3/uL (ref 0.0–0.1)
Basophils Relative: 0 %
Eosinophils Absolute: 0.1 10*3/uL (ref 0.0–0.5)
Eosinophils Relative: 2 %
HCT: 38 % — ABNORMAL LOW (ref 39.0–52.0)
Hemoglobin: 12.3 g/dL — ABNORMAL LOW (ref 13.0–17.0)
Immature Granulocytes: 1 %
Lymphocytes Relative: 19 %
Lymphs Abs: 1 10*3/uL (ref 0.7–4.0)
MCH: 28.1 pg (ref 26.0–34.0)
MCHC: 32.4 g/dL (ref 30.0–36.0)
MCV: 87 fL (ref 80.0–100.0)
Monocytes Absolute: 0.5 10*3/uL (ref 0.1–1.0)
Monocytes Relative: 10 %
Neutro Abs: 3.8 10*3/uL (ref 1.7–7.7)
Neutrophils Relative %: 68 %
Platelet Count: 169 10*3/uL (ref 150–400)
RBC: 4.37 MIL/uL (ref 4.22–5.81)
RDW: 14.3 % (ref 11.5–15.5)
WBC Count: 5.5 10*3/uL (ref 4.0–10.5)
nRBC: 0 % (ref 0.0–0.2)

## 2021-12-25 LAB — URINALYSIS, COMPLETE (UACMP) WITH MICROSCOPIC
Bilirubin Urine: NEGATIVE
Glucose, UA: NEGATIVE mg/dL
Hgb urine dipstick: NEGATIVE
Ketones, ur: NEGATIVE mg/dL
Leukocytes,Ua: NEGATIVE
Nitrite: NEGATIVE
Protein, ur: NEGATIVE mg/dL
Specific Gravity, Urine: 1.019 (ref 1.005–1.030)
pH: 5.5 (ref 5.0–8.0)

## 2021-12-25 LAB — MAGNESIUM: Magnesium: 2 mg/dL (ref 1.7–2.4)

## 2021-12-25 MED ORDER — SODIUM CHLORIDE 0.9 % IV SOLN
120.0000 mg/m2 | Freq: Once | INTRAVENOUS | Status: AC
  Administered 2021-12-25: 220 mg via INTRAVENOUS
  Filled 2021-12-25: qty 5

## 2021-12-25 MED ORDER — SODIUM CHLORIDE 0.9 % IV SOLN
10.0000 mg | Freq: Once | INTRAVENOUS | Status: AC
  Administered 2021-12-25: 10 mg via INTRAVENOUS
  Filled 2021-12-25: qty 1

## 2021-12-25 MED ORDER — SODIUM CHLORIDE 0.9 % IV SOLN
150.0000 mg | Freq: Once | INTRAVENOUS | Status: AC
  Administered 2021-12-25: 150 mg via INTRAVENOUS
  Filled 2021-12-25: qty 5

## 2021-12-25 MED ORDER — SODIUM CHLORIDE 0.9 % IV SOLN
400.0000 mg/m2 | Freq: Once | INTRAVENOUS | Status: AC
  Administered 2021-12-25: 704 mg via INTRAVENOUS
  Filled 2021-12-25: qty 35.2

## 2021-12-25 MED ORDER — PALONOSETRON HCL INJECTION 0.25 MG/5ML
0.2500 mg | Freq: Once | INTRAVENOUS | Status: AC
  Administered 2021-12-25: 0.25 mg via INTRAVENOUS
  Filled 2021-12-25: qty 5

## 2021-12-25 MED ORDER — DEXTROSE 5 % IV SOLN: Freq: Once | INTRAVENOUS | Status: AC

## 2021-12-25 MED ORDER — ATROPINE SULFATE 1 MG/ML IV SOLN
0.5000 mg | Freq: Once | INTRAVENOUS | Status: AC | PRN
  Administered 2021-12-25: 0.5 mg via INTRAVENOUS
  Filled 2021-12-25: qty 1

## 2021-12-25 MED ORDER — SODIUM CHLORIDE 0.9 % IV SOLN
2000.0000 mg/m2 | INTRAVENOUS | Status: DC
  Administered 2021-12-25: 3500 mg via INTRAVENOUS
  Filled 2021-12-25: qty 70

## 2021-12-25 NOTE — Progress Notes (Signed)
Patient presents for treatment. RN assessment completed along with the following: ? ?Labs/vitals reviewed - Yes, and Please see collab nurse note    ?Weight within 10% of previous measurement - Yes ?Informed consent completed and reflects current therapy/intent - Yes, on date 06/19/2021             ?Provider progress note reviewed - Yes, today's provider note was reviewed. ?Treatment/Antibody/Supportive plan reviewed - Yes, and there are no adjustments needed for today's treatment. ?S&H and other orders reviewed - Yes, and there are no additional orders identified. ?Previous treatment date reviewed - Yes, and the appropriate amount of time has elapsed between treatments. ?Clinic Hand Off Received from - Yes from [employer], RN ? ?Patient to proceed with treatment.  ? ?

## 2021-12-25 NOTE — Progress Notes (Signed)
Patient seen by Ned Card NP today ? ?Vitals are within treatment parameters. ? ?Labs reviewed by Ned Card NP and are within treatment parameters. ? ?Per physician team, patient is ready for treatment. Please note that modifications are being made to the treatment plan including Will hold oxaliplatin today due to neuropathy and re-evaluate in 2 weeks.   ?

## 2021-12-25 NOTE — Progress Notes (Signed)
?[facility] ?OFFICE PROGRESS NOTE ? ? ?Diagnosis: Pancreas cancer ? ?INTERVAL HISTORY:  ? ?David Jarvis returns as scheduled.  He is seen today prior to resuming chemotherapy with adjuvant FOLFIRINOX.  He contacted the office last week to report tingling in the fingers.  He reports 2 to 3 weeks of mild tingling in the fingertips.  Periodically has similar symptoms in the feet.  No numbness.  Symptoms do not interfere with function.  He denies nausea/vomiting.  No diarrhea or constipation.  He has a good appetite. ? ?Objective: ? ?Vital signs in last 24 hours: ? ?Blood pressure 135/82, pulse 68, temperature 97.9 ?F (36.6 ?C), temperature source Oral, resp. rate 18, height '5\' 6"'$  (1.676 m), weight 147 lb (66.7 kg), SpO2 99 %. ?  ? ?HEENT: No thrush or ulcers. ?Resp: Lungs clear bilaterally. ?Cardio: Regular rate and rhythm. ?GI: Abdomen soft and nontender.  No hepatosplenomegaly.  Healed surgical incisions. ?Vascular: No leg edema. ?Neuro: Mild decrease in vibratory sense over the fingertips per tuning fork exam. ?Skin: Palms without erythema. ?Port-A-Cath without erythema. ? ? ?Lab Results: ? ?Lab Results  ?Component Value Date  ? WBC 5.5 12/25/2021  ? HGB 12.3 (L) 12/25/2021  ? HCT 38.0 (L) 12/25/2021  ? MCV 87.0 12/25/2021  ? PLT 169 12/25/2021  ? NEUTROABS 3.8 12/25/2021  ? ? ?Imaging: ? ?No results found. ? ?Medications: I have reviewed the patient's current medications. ? ?Assessment/Plan: ?Pancreas cancer, adenocarcinoma ?MRI/MRCP abdomen 05/18/2021-diffuse biliary and pancreatic duct dilation, 2.8 cm mass in the pancreas head, no evidence of metastatic disease ?ERCP 05/23/2021-localized stricture in the lower third of the main bile duct, stent placed ?EUS 05/23/2021-25 x 15 mm pancreas head mass with irregular borders, no lymphadenopathy, no vascular involvement, T2 N0 by ultrasound ?CT chest 06/01/2021-negative for metastatic disease ?CT abdomen pancreas protocol 06/01/2021-3.2 x 2.3 cm hypoenhancing  pancreas head mass inseparable from the third portion of the duodenum, 1 cm left retroperitoneal lymph node, no vascular involvement ?Cycle 1 FOLFIRINOX 06/19/2021 ?Cycle 2 FOLFIRINOX 07/02/2021, irinotecan and 5-FU dose reduced, prophylactic dexamethasone added beginning day of pump discontinuation ?Cycle 3 FOLFIRINOX 07/23/2021, Udenyca ?CT abdomen/pelvis 08/01/2021-done in the emergency department to evaluate epigastric pain-pancreas head mass decreased in size.  Diffuse gaseous distention of large and small bowel.  No bowel obstruction or focal inflammation. 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  GERD ?7.  Squamous cell carcinoma left forearm 2014 ?8.  Hyperlipidemia ?9.  CT chest 08/01/2021, done in the emergency department to evaluate pleuritic chest pain-no PE.  Advanced three-vessel coronary vascular calcification. ?10.  Fever of unknown origin beginning 08/14/2021-blood and urine cultures negative, no source for infection identified, placed on antibiotic prophylaxis beginning 08/17/2021,  recurrent fever 10/09/2021-cultures negative, completed a course of Levaquin ?11.  Fever/chills on 12/02/2021-urinalysis  with clumps of white cells, culture positive for Staphylococcus epidermis and Enterococcus, completed a course of Augmentin ?  ?  ? ?Disposition: Mr. Diodato appears stable.  He is scheduled to begin adjuvant FOLFIRINOX today.  Since his last visit he has developed tingling in the fingertips.  We discussed the likelihood that this is related to previous oxaliplatin.  We decided to hold oxaliplatin with today's treatment, proceed with FOLFIRI.  Plan to reevaluate in 2 weeks and potentially resume the oxaliplatin.  He agrees with this plan. ? ?CBC from today reviewed.  Counts adequate to proceed with treatment.  He will receive Udenyca on the day of pump discontinuation. ? ?He will return for lab, follow-up, chemotherapy in 2 weeks.  He will contact the office in the interim with any problems. ? ?Patient seen with Dr. Benay Spice. ? ? ? ?Ned Card ANP/GNP-BC  ? ?12/25/2021  ?8:38 AM ?This was a shared visit with Ned Card.  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  We discussed the risk of continuing oxaliplatin.  We decided to hold oxaliplatin with this cycle of chemotherapy. ? ?I was present for greater than 50% of today's visit.  I performed medical stage making. ? ?Julieanne Manson, MD ? ? ? ? ? ? ?

## 2021-12-25 NOTE — Patient Instructions (Signed)
[facility]   ?Discharge Instructions: ?Thank you for choosing [facility] to provide your oncology and hematology care.  ? ?If you have a lab appointment with the [facility], please go directly to the [facility] and check in at the registration area. ?  ?Wear comfortable clothing and clothing appropriate for easy access to any Portacath or PICC line.  ? ?We strive to give you quality time with your provider. You may need to reschedule your appointment if you arrive late (15 or more minutes).  Arriving late affects you and other patients whose appointments are after yours.  Also, if you miss three or more appointments without notifying the office, you may be dismissed from the clinic at the provider?s discretion.    ?  ?For prescription refill requests, have your pharmacy contact our office and allow 72 hours for refills to be completed.   ? ?Today you received the following chemotherapy and/or immunotherapy agents Irinotecan (CAMPTOSAR), Leucovorin & Flourouracil (ADRUCIL).    ?  ?To help prevent nausea and vomiting after your treatment, we encourage you to take your nausea medication as directed. ? ?BELOW ARE SYMPTOMS THAT SHOULD BE REPORTED IMMEDIATELY: ?*FEVER GREATER THAN 100.4 F (38 ?C) OR HIGHER ?*CHILLS OR SWEATING ?*NAUSEA AND VOMITING THAT IS NOT CONTROLLED WITH YOUR NAUSEA MEDICATION ?*UNUSUAL SHORTNESS OF BREATH ?*UNUSUAL BRUISING OR BLEEDING ?*URINARY PROBLEMS (pain or burning when urinating, or frequent urination) ?*BOWEL PROBLEMS (unusual diarrhea, constipation, pain near the anus) ?TENDERNESS IN MOUTH AND THROAT WITH OR WITHOUT PRESENCE OF ULCERS (sore throat, sores in mouth, or a toothache) ?UNUSUAL RASH, SWELLING OR PAIN  ?UNUSUAL VAGINAL DISCHARGE OR ITCHING  ? ?Items with * indicate a potential emergency and should be followed up as soon as possible or go to the Emergency Department if any problems should occur. ? ?Please show the CHEMOTHERAPY ALERT  CARD or IMMUNOTHERAPY ALERT CARD at check-in to the Emergency Department and triage nurse. ? ?Should you have questions after your visit or need to cancel or reschedule your appointment, please contact [facility]  Dept: [phone]  and follow the prompts.  Office hours are 8:00 a.m. to 4:30 p.m. Monday - Friday. Please note that voicemails left after 4:00 p.m. may not be returned until the following business day.  We are closed weekends and major holidays. You have access to a nurse at all times for urgent questions. Please call the main number to the clinic Dept: [phone] and follow the prompts. ? ? ?For any non-urgent questions, you may also contact your provider using MyChart. We now offer e-Visits for anyone 86 and older to request care online for non-urgent symptoms. For details visit [email or web address]. ?  ?Also download the MyChart app! Go to the app store, search "MyChart", open the app, select [facility], and log in with your MyChart username and password. ? ?Due to Covid, a mask is required upon entering the hospital/clinic. If you do not have a mask, one will be given to you upon arrival. For doctor visits, patients may have 1 support person aged 77 or older with them. For treatment visits, patients cannot have anyone with them due to current Covid guidelines and our immunocompromised population.  ? ?Irinotecan injection ?What is this medication? ?IRINOTECAN (ir in oh TEE kan ) is a chemotherapy drug. It is used to treat colon and rectal cancer. ?This medicine may be used for other purposes; ask your health care provider or pharmacist if you  have questions. ?COMMON BRAND NAME(S): Camptosar ?What should I tell my care team before I take this medication? ?They need to know if you have any of these conditions: ?dehydration ?diarrhea ?infection (especially a virus infection such as chickenpox, cold sores, or herpes) ?liver disease ?low blood counts, like low white cell,  platelet, or red cell counts ?low levels of calcium, magnesium, or potassium in the blood ?recent or ongoing radiation therapy ?an unusual or allergic reaction to irinotecan, other medicines, foods, dyes, or preservatives ?pregnant or trying to get pregnant ?breast-feeding ?How should I use this medication? ?This drug is given as an infusion into a vein. It is administered in a hospital or clinic by a specially trained health care professional. ?Talk to your pediatrician regarding the use of this medicine in children. Special care may be needed. ?Overdosage: If you think you have taken too much of this medicine contact a poison control center or emergency room at once. ?NOTE: This medicine is only for you. Do not share this medicine with others. ?What if I miss a dose? ?It is important not to miss your dose. Call your doctor or health care professional if you are unable to keep an appointment. ?What may interact with this medication? ?Do not take this medicine with any of the following medications: ?cobicistat ?itraconazole ?This medicine may interact with the following medications: ?antiviral medicines for HIV or AIDS ?certain antibiotics like rifampin or rifabutin ?certain medicines for fungal infections like ketoconazole, posaconazole, and voriconazole ?certain medicines for seizures like carbamazepine, phenobarbital, phenotoin ?clarithromycin ?gemfibrozil ?nefazodone ?Nokomis ?This list may not describe all possible interactions. Give your health care provider a list of all the medicines, herbs, non-prescription drugs, or dietary supplements you use. Also tell them if you smoke, drink alcohol, or use illegal drugs. Some items may interact with your medicine. ?What should I watch for while using this medication? ?Your condition will be monitored carefully while you are receiving this medicine. You will need important blood work done while you are taking this medicine. ?This drug may make you feel  generally unwell. This is not uncommon, as chemotherapy can affect healthy cells as well as cancer cells. Report any side effects. Continue your course of treatment even though you feel ill unless your doctor tells you to stop. ?In some cases, you may be given additional medicines to help with side effects. Follow all directions for their use. ?You may get drowsy or dizzy. Do not drive, use machinery, or do anything that needs mental alertness until you know how this medicine affects you. Do not stand or sit up quickly, especially if you are an older patient. This reduces the risk of dizzy or fainting spells. ?Call your health care professional for advice if you get a fever, chills, or sore throat, or other symptoms of a cold or flu. Do not treat yourself. This medicine decreases your body's ability to fight infections. Try to avoid being around people who are sick. ?Avoid taking products that contain aspirin, acetaminophen, ibuprofen, naproxen, or ketoprofen unless instructed by your doctor. These medicines may hide a fever. ?This medicine may increase your risk to bruise or bleed. Call your doctor or health care professional if you notice any unusual bleeding. ?Be careful brushing and flossing your teeth or using a toothpick because you may get an infection or bleed more easily. If you have any dental work done, tell your dentist you are receiving this medicine. ?Do not become pregnant while taking this medicine or for  6 months after stopping it. Women should inform their health care professional if they wish to become pregnant or think they might be pregnant. Men should not father a child while taking this medicine and for 3 months after stopping it. There is potential for serious side effects to an unborn child. Talk to your health care professional for more information. ?Do not breast-feed an infant while taking this medicine or for 7 days after stopping it. ?This medicine has caused ovarian failure in some  women. This medicine may make it more difficult to get pregnant. Talk to your health care professional if you are concerned about your fertility. ?This medicine has caused decreased sperm counts in some men. T

## 2021-12-26 LAB — URINE CULTURE: Culture: NO GROWTH

## 2021-12-27 ENCOUNTER — Inpatient Hospital Stay: Payer: Medicare HMO

## 2021-12-27 VITALS — BP 112/71 | HR 52 | Temp 97.9°F | Resp 18

## 2021-12-27 DIAGNOSIS — E785 Hyperlipidemia, unspecified: Secondary | ICD-10-CM | POA: Diagnosis not present

## 2021-12-27 DIAGNOSIS — Z5111 Encounter for antineoplastic chemotherapy: Secondary | ICD-10-CM | POA: Diagnosis not present

## 2021-12-27 DIAGNOSIS — I700 Atherosclerosis of aorta: Secondary | ICD-10-CM | POA: Diagnosis not present

## 2021-12-27 DIAGNOSIS — C250 Malignant neoplasm of head of pancreas: Secondary | ICD-10-CM

## 2021-12-27 DIAGNOSIS — R509 Fever, unspecified: Secondary | ICD-10-CM | POA: Diagnosis not present

## 2021-12-27 DIAGNOSIS — E782 Mixed hyperlipidemia: Secondary | ICD-10-CM | POA: Diagnosis not present

## 2021-12-27 DIAGNOSIS — N390 Urinary tract infection, site not specified: Secondary | ICD-10-CM | POA: Diagnosis not present

## 2021-12-27 DIAGNOSIS — C772 Secondary and unspecified malignant neoplasm of intra-abdominal lymph nodes: Secondary | ICD-10-CM | POA: Diagnosis not present

## 2021-12-27 DIAGNOSIS — Z9221 Personal history of antineoplastic chemotherapy: Secondary | ICD-10-CM | POA: Diagnosis not present

## 2021-12-27 DIAGNOSIS — C61 Malignant neoplasm of prostate: Secondary | ICD-10-CM | POA: Diagnosis not present

## 2021-12-27 DIAGNOSIS — N400 Enlarged prostate without lower urinary tract symptoms: Secondary | ICD-10-CM | POA: Diagnosis not present

## 2021-12-27 DIAGNOSIS — I10 Essential (primary) hypertension: Secondary | ICD-10-CM | POA: Diagnosis not present

## 2021-12-27 DIAGNOSIS — Z0000 Encounter for general adult medical examination without abnormal findings: Secondary | ICD-10-CM | POA: Diagnosis not present

## 2021-12-27 DIAGNOSIS — Z5189 Encounter for other specified aftercare: Secondary | ICD-10-CM | POA: Diagnosis not present

## 2021-12-27 DIAGNOSIS — B958 Unspecified staphylococcus as the cause of diseases classified elsewhere: Secondary | ICD-10-CM | POA: Diagnosis not present

## 2021-12-27 DIAGNOSIS — K219 Gastro-esophageal reflux disease without esophagitis: Secondary | ICD-10-CM | POA: Diagnosis not present

## 2021-12-27 DIAGNOSIS — C259 Malignant neoplasm of pancreas, unspecified: Secondary | ICD-10-CM | POA: Diagnosis not present

## 2021-12-27 DIAGNOSIS — R7303 Prediabetes: Secondary | ICD-10-CM | POA: Diagnosis not present

## 2021-12-27 DIAGNOSIS — B952 Enterococcus as the cause of diseases classified elsewhere: Secondary | ICD-10-CM | POA: Diagnosis not present

## 2021-12-27 DIAGNOSIS — E039 Hypothyroidism, unspecified: Secondary | ICD-10-CM | POA: Diagnosis not present

## 2021-12-27 MED ORDER — PEGFILGRASTIM-CBQV 6 MG/0.6ML ~~LOC~~ SOSY
6.0000 mg | PREFILLED_SYRINGE | Freq: Once | SUBCUTANEOUS | Status: AC
  Administered 2021-12-27: 6 mg via SUBCUTANEOUS
  Filled 2021-12-27: qty 0.6

## 2021-12-27 MED ORDER — SODIUM CHLORIDE 0.9% FLUSH
10.0000 mL | INTRAVENOUS | Status: DC | PRN
  Administered 2021-12-27: 10 mL

## 2021-12-27 MED ORDER — HEPARIN SOD (PORK) LOCK FLUSH 100 UNIT/ML IV SOLN
500.0000 [IU] | Freq: Once | INTRAVENOUS | Status: AC | PRN
  Administered 2021-12-27: 500 [IU]

## 2021-12-27 NOTE — Patient Instructions (Signed)

## 2021-12-28 DIAGNOSIS — R7303 Prediabetes: Secondary | ICD-10-CM | POA: Diagnosis not present

## 2021-12-28 DIAGNOSIS — Z131 Encounter for screening for diabetes mellitus: Secondary | ICD-10-CM | POA: Diagnosis not present

## 2022-01-05 ENCOUNTER — Other Ambulatory Visit: Payer: Self-pay | Admitting: Oncology

## 2022-01-07 ENCOUNTER — Inpatient Hospital Stay: Payer: Medicare HMO

## 2022-01-07 ENCOUNTER — Inpatient Hospital Stay: Payer: Medicare HMO | Attending: Oncology

## 2022-01-07 ENCOUNTER — Inpatient Hospital Stay: Payer: Medicare HMO | Admitting: Oncology

## 2022-01-07 ENCOUNTER — Encounter: Payer: Self-pay | Admitting: *Deleted

## 2022-01-07 VITALS — BP 123/79 | HR 63 | Temp 98.2°F | Resp 18 | Ht 66.0 in | Wt 145.2 lb

## 2022-01-07 DIAGNOSIS — C250 Malignant neoplasm of head of pancreas: Secondary | ICD-10-CM | POA: Insufficient documentation

## 2022-01-07 DIAGNOSIS — Z5111 Encounter for antineoplastic chemotherapy: Secondary | ICD-10-CM | POA: Diagnosis not present

## 2022-01-07 DIAGNOSIS — T451X5A Adverse effect of antineoplastic and immunosuppressive drugs, initial encounter: Secondary | ICD-10-CM | POA: Diagnosis not present

## 2022-01-07 DIAGNOSIS — I10 Essential (primary) hypertension: Secondary | ICD-10-CM | POA: Insufficient documentation

## 2022-01-07 DIAGNOSIS — C772 Secondary and unspecified malignant neoplasm of intra-abdominal lymph nodes: Secondary | ICD-10-CM | POA: Insufficient documentation

## 2022-01-07 DIAGNOSIS — Z79899 Other long term (current) drug therapy: Secondary | ICD-10-CM | POA: Diagnosis not present

## 2022-01-07 DIAGNOSIS — K219 Gastro-esophageal reflux disease without esophagitis: Secondary | ICD-10-CM | POA: Diagnosis not present

## 2022-01-07 DIAGNOSIS — E785 Hyperlipidemia, unspecified: Secondary | ICD-10-CM | POA: Diagnosis not present

## 2022-01-07 DIAGNOSIS — G620 Drug-induced polyneuropathy: Secondary | ICD-10-CM | POA: Diagnosis not present

## 2022-01-07 DIAGNOSIS — R197 Diarrhea, unspecified: Secondary | ICD-10-CM | POA: Insufficient documentation

## 2022-01-07 DIAGNOSIS — Z5189 Encounter for other specified aftercare: Secondary | ICD-10-CM | POA: Insufficient documentation

## 2022-01-07 LAB — CBC WITH DIFFERENTIAL (CANCER CENTER ONLY)
Abs Immature Granulocytes: 0.07 10*3/uL (ref 0.00–0.07)
Basophils Absolute: 0 10*3/uL (ref 0.0–0.1)
Basophils Relative: 0 %
Eosinophils Absolute: 0.1 10*3/uL (ref 0.0–0.5)
Eosinophils Relative: 2 %
HCT: 35.7 % — ABNORMAL LOW (ref 39.0–52.0)
Hemoglobin: 11.5 g/dL — ABNORMAL LOW (ref 13.0–17.0)
Immature Granulocytes: 1 %
Lymphocytes Relative: 16 %
Lymphs Abs: 1.4 10*3/uL (ref 0.7–4.0)
MCH: 28.4 pg (ref 26.0–34.0)
MCHC: 32.2 g/dL (ref 30.0–36.0)
MCV: 88.1 fL (ref 80.0–100.0)
Monocytes Absolute: 0.8 10*3/uL (ref 0.1–1.0)
Monocytes Relative: 9 %
Neutro Abs: 6.3 10*3/uL (ref 1.7–7.7)
Neutrophils Relative %: 72 %
Platelet Count: 159 10*3/uL (ref 150–400)
RBC: 4.05 MIL/uL — ABNORMAL LOW (ref 4.22–5.81)
RDW: 16.4 % — ABNORMAL HIGH (ref 11.5–15.5)
WBC Count: 8.7 10*3/uL (ref 4.0–10.5)
nRBC: 0 % (ref 0.0–0.2)

## 2022-01-07 LAB — CMP (CANCER CENTER ONLY)
ALT: 188 U/L — ABNORMAL HIGH (ref 0–44)
AST: 68 U/L — ABNORMAL HIGH (ref 15–41)
Albumin: 4 g/dL (ref 3.5–5.0)
Alkaline Phosphatase: 335 U/L — ABNORMAL HIGH (ref 38–126)
Anion gap: 11 (ref 5–15)
BUN: 16 mg/dL (ref 8–23)
CO2: 21 mmol/L — ABNORMAL LOW (ref 22–32)
Calcium: 8.6 mg/dL — ABNORMAL LOW (ref 8.9–10.3)
Chloride: 108 mmol/L (ref 98–111)
Creatinine: 1.04 mg/dL (ref 0.61–1.24)
GFR, Estimated: >60 mL/min (ref >60)
Glucose, Bld: 124 mg/dL — ABNORMAL HIGH (ref 70–99)
Potassium: 3.9 mmol/L (ref 3.5–5.1)
Sodium: 140 mmol/L (ref 135–145)
Total Bilirubin: 0.4 mg/dL (ref 0.3–1.2)
Total Protein: 6.2 g/dL — ABNORMAL LOW (ref 6.5–8.1)

## 2022-01-07 LAB — MAGNESIUM: Magnesium: 1.9 mg/dL (ref 1.7–2.4)

## 2022-01-07 MED ORDER — DEXTROSE 5 % IV SOLN: Freq: Once | INTRAVENOUS | Status: AC

## 2022-01-07 MED ORDER — SODIUM CHLORIDE 0.9 % IV SOLN
2000.0000 mg/m2 | INTRAVENOUS | Status: DC
  Administered 2022-01-07: 3500 mg via INTRAVENOUS
  Filled 2022-01-07: qty 70

## 2022-01-07 MED ORDER — SODIUM CHLORIDE 0.9 % IV SOLN
400.0000 mg/m2 | Freq: Once | INTRAVENOUS | Status: AC
  Administered 2022-01-07: 704 mg via INTRAVENOUS
  Filled 2022-01-07: qty 35.2

## 2022-01-07 MED ORDER — SODIUM CHLORIDE 0.9 % IV SOLN
10.0000 mg | Freq: Once | INTRAVENOUS | Status: AC
  Administered 2022-01-07: 10 mg via INTRAVENOUS
  Filled 2022-01-07: qty 1

## 2022-01-07 MED ORDER — ATROPINE SULFATE 1 MG/ML IV SOLN
0.5000 mg | Freq: Once | INTRAVENOUS | Status: AC | PRN
  Administered 2022-01-07: 0.5 mg via INTRAVENOUS
  Filled 2022-01-07: qty 1

## 2022-01-07 MED ORDER — SODIUM CHLORIDE 0.9 % IV SOLN
150.0000 mg | Freq: Once | INTRAVENOUS | Status: AC
  Administered 2022-01-07: 150 mg via INTRAVENOUS
  Filled 2022-01-07: qty 5

## 2022-01-07 MED ORDER — SODIUM CHLORIDE 0.9 % IV SOLN
120.0000 mg/m2 | Freq: Once | INTRAVENOUS | Status: AC
  Administered 2022-01-07: 220 mg via INTRAVENOUS
  Filled 2022-01-07: qty 6

## 2022-01-07 MED ORDER — PALONOSETRON HCL INJECTION 0.25 MG/5ML
0.2500 mg | Freq: Once | INTRAVENOUS | Status: AC
  Administered 2022-01-07: 0.25 mg via INTRAVENOUS
  Filled 2022-01-07: qty 5

## 2022-01-07 NOTE — Progress Notes (Signed)
?David Jarvis ?OFFICE PROGRESS NOTE ? ? ?Diagnosis: Pancreas cancer ? ?INTERVAL HISTORY:  ? ?David Jarvis completed another cycle of FOLFIRI on 12/25/2021.  No nausea.  He reports mild diarrhea.  He continues to have constant tingling in the fingers and toes.  The tingling is sometimes present in the feet.  He has been working outside.  Mild discomfort at the low transverse incision site. ? ?Objective: ? ?Vital signs in last 24 hours: ? ?Blood pressure 123/79, pulse 63, temperature 98.2 ?F (36.8 ?C), temperature source Oral, resp. rate 18, height '5\' 6"'$  (1.676 m), weight 145 lb 3.2 oz (65.9 kg), SpO2 97 %. ?  ? ?HEENT: No thrush or ulcers ?Resp: Lungs clear bilaterally ?Cardio: Regular rate and rhythm ?GI: No hepatosplenomegaly, no mass, healed surgical incisions, nontender ?Vascular: No leg edema ?Neuro: Mild to moderate loss of vibratory sense at the fingertips bilaterally ?Skin: Palms without erythema ? ?Portacath/PICC-without erythema ? ?Lab Results: ? ?Lab Results  ?Component Value Date  ? WBC 8.7 01/07/2022  ? HGB 11.5 (L) 01/07/2022  ? HCT 35.7 (L) 01/07/2022  ? MCV 88.1 01/07/2022  ? PLT 159 01/07/2022  ? NEUTROABS 6.3 01/07/2022  ? ? ?CMP  ?Lab Results  ?Component Value Date  ? NA 140 12/25/2021  ? K 4.0 12/25/2021  ? CL 106 12/25/2021  ? CO2 24 12/25/2021  ? GLUCOSE 121 (H) 12/25/2021  ? BUN 15 12/25/2021  ? CREATININE 1.05 12/25/2021  ? CALCIUM 9.5 12/25/2021  ? PROT 6.6 12/25/2021  ? ALBUMIN 4.2 12/25/2021  ? AST 83 (H) 12/25/2021  ? ALT 59 (H) 12/25/2021  ? ALKPHOS 139 (H) 12/25/2021  ? BILITOT 0.5 12/25/2021  ? GFRNONAA >60 12/25/2021  ? ? ?Lab Results  ?Component Value Date  ? [id number] 28 10/01/2021  ? ? ?Medications: I have reviewed the patient's current medications. ? ? ?Assessment/Plan: ?Pancreas cancer, adenocarcinoma ?MRI/MRCP abdomen 05/18/2021-diffuse biliary and pancreatic duct dilation, 2.8 cm mass in the pancreas head, no evidence of metastatic disease ?ERCP 05/23/2021-localized  stricture in the lower third of the main bile duct, stent placed ?EUS 05/23/2021-25 x 15 mm pancreas head mass with irregular borders, no lymphadenopathy, no vascular involvement, T2 N0 by ultrasound ?CT chest 06/01/2021-negative for metastatic disease ?CT abdomen pancreas protocol 06/01/2021-3.2 x 2.3 cm hypoenhancing pancreas head mass inseparable from the third portion of the duodenum, 1 cm left retroperitoneal lymph node, no vascular involvement ?Cycle 1 FOLFIRINOX 06/19/2021 ?Cycle 2 FOLFIRINOX 07/02/2021, irinotecan and 5-FU dose reduced, prophylactic dexamethasone added beginning day of pump discontinuation ?Cycle 3 FOLFIRINOX 07/23/2021, Udenyca ?CT abdomen/pelvis 08/01/2021-done in the emergency department to evaluate epigastric pain-pancreas head mass decreased in size.  Diffuse gaseous distention of large and small bowel.  No bowel obstruction or focal inflammation. 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  GERD ?7.  Squamous  cell carcinoma left forearm 2014 ?8.  Hyperlipidemia ?9.  CT chest 08/01/2021, done in the emergency department to evaluate pleuritic chest pain-no PE.  Advanced three-vessel coronary vascular calcification. ?10.  Fever of unknown origin beginning 08/14/2021-blood and urine cultures negative, no source for infection identified, placed on antibiotic prophylaxis beginning 08/17/2021, recurrent fever 10/09/2021-cultures negative, completed a course of Levaquin ?11.  Fever/chills on 12/02/2021-urinalysis with clumps of white cells, culture positive for Staphylococcus epidermis and Enterococcus, completed a course of Augmentin ?  ?  ? ? ?Disposition: ?David Jarvis appears stable.  He tolerated the last cycle of FOLFIRI well.  Neuropathy symptoms persist.  We discussed the risk of proceeding with oxaliplatin chemotherapy.  Oxaliplatin will remain on hold.  He will complete another cycle of FOLFIRI today. ? ?David Jarvis will return for an office visit and chemotherapy in 2 weeks. ? ?Betsy Coder, MD ? ?01/07/2022  ?8:42 AM ? ? ?

## 2022-01-07 NOTE — Patient Instructions (Signed)
[facility]   ?Discharge Instructions: ?Thank you for choosing [facility] to provide your oncology and hematology care.  ? ?If you have a lab appointment with the [facility], please go directly to the [facility] and check in at the registration area. ?  ?Wear comfortable clothing and clothing appropriate for easy access to any Portacath or PICC line.  ? ?We strive to give you quality time with your provider. You may need to reschedule your appointment if you arrive late (15 or more minutes).  Arriving late affects you and other patients whose appointments are after yours.  Also, if you miss three or more appointments without notifying the office, you may be dismissed from the clinic at the provider?s discretion.    ?  ?For prescription refill requests, have your pharmacy contact our office and allow 72 hours for refills to be completed.   ? ?Today you received the following chemotherapy and/or immunotherapy agents Leucovorin, Irinotecan (CAMPTOSAR) & Flourouracil (ADRUCIL).    ?  ?To help prevent nausea and vomiting after your treatment, we encourage you to take your nausea medication as directed. ? ?BELOW ARE SYMPTOMS THAT SHOULD BE REPORTED IMMEDIATELY: ?*FEVER GREATER THAN 100.4 F (38 ?C) OR HIGHER ?*CHILLS OR SWEATING ?*NAUSEA AND VOMITING THAT IS NOT CONTROLLED WITH YOUR NAUSEA MEDICATION ?*UNUSUAL SHORTNESS OF BREATH ?*UNUSUAL BRUISING OR BLEEDING ?*URINARY PROBLEMS (pain or burning when urinating, or frequent urination) ?*BOWEL PROBLEMS (unusual diarrhea, constipation, pain near the anus) ?TENDERNESS IN MOUTH AND THROAT WITH OR WITHOUT PRESENCE OF ULCERS (sore throat, sores in mouth, or a toothache) ?UNUSUAL RASH, SWELLING OR PAIN  ?UNUSUAL VAGINAL DISCHARGE OR ITCHING  ? ?Items with * indicate a potential emergency and should be followed up as soon as possible or go to the Emergency Department if any problems should occur. ? ?Please show the CHEMOTHERAPY ALERT  CARD or IMMUNOTHERAPY ALERT CARD at check-in to the Emergency Department and triage nurse. ? ?Should you have questions after your visit or need to cancel or reschedule your appointment, please contact [facility]  Dept: [phone]  and follow the prompts.  Office hours are 8:00 a.m. to 4:30 p.m. Monday - Friday. Please note that voicemails left after 4:00 p.m. may not be returned until the following business day.  We are closed weekends and major holidays. You have access to a nurse at all times for urgent questions. Please call the main number to the clinic Dept: [phone] and follow the prompts. ? ? ?For any non-urgent questions, you may also contact your provider using MyChart. We now offer e-Visits for anyone 91 and older to request care online for non-urgent symptoms. For details visit [email or web address]. ?  ?Also download the MyChart app! Go to the app store, search "MyChart", open the app, select [facility], and log in with your MyChart username and password. ? ?Due to Covid, a mask is required upon entering the hospital/clinic. If you do not have a mask, one will be given to you upon arrival. For doctor visits, patients may have 1 support person aged 3 or older with them. For treatment visits, patients cannot have anyone with them due to current Covid guidelines and our immunocompromised population.  ? ?Irinotecan injection ?What is this medication? ?IRINOTECAN (ir in oh TEE kan ) is a chemotherapy drug. It is used to treat colon and rectal cancer. ?This medicine may be used for other purposes; ask your health care provider or pharmacist if you  have questions. ?COMMON BRAND NAME(S): Camptosar ?What should I tell my care team before I take this medication? ?They need to know if you have any of these conditions: ?dehydration ?diarrhea ?infection (especially a virus infection such as chickenpox, cold sores, or herpes) ?liver disease ?low blood counts, like low white cell,  platelet, or red cell counts ?low levels of calcium, magnesium, or potassium in the blood ?recent or ongoing radiation therapy ?an unusual or allergic reaction to irinotecan, other medicines, foods, dyes, or preservatives ?pregnant or trying to get pregnant ?breast-feeding ?How should I use this medication? ?This drug is given as an infusion into a vein. It is administered in a hospital or clinic by a specially trained health care professional. ?Talk to your pediatrician regarding the use of this medicine in children. Special care may be needed. ?Overdosage: If you think you have taken too much of this medicine contact a poison control center or emergency room at once. ?NOTE: This medicine is only for you. Do not share this medicine with others. ?What if I miss a dose? ?It is important not to miss your dose. Call your doctor or health care professional if you are unable to keep an appointment. ?What may interact with this medication? ?Do not take this medicine with any of the following medications: ?cobicistat ?itraconazole ?This medicine may interact with the following medications: ?antiviral medicines for HIV or AIDS ?certain antibiotics like rifampin or rifabutin ?certain medicines for fungal infections like ketoconazole, posaconazole, and voriconazole ?certain medicines for seizures like carbamazepine, phenobarbital, phenotoin ?clarithromycin ?gemfibrozil ?nefazodone ?[place] ?This list may not describe all possible interactions. Give your health care provider a list of all the medicines, herbs, non-prescription drugs, or dietary supplements you use. Also tell them if you smoke, drink alcohol, or use illegal drugs. Some items may interact with your medicine. ?What should I watch for while using this medication? ?Your condition will be monitored carefully while you are receiving this medicine. You will need important blood work done while you are taking this medicine. ?This drug may make you feel  generally unwell. This is not uncommon, as chemotherapy can affect healthy cells as well as cancer cells. Report any side effects. Continue your course of treatment even though you feel ill unless your doctor tells you to stop. ?In some cases, you may be given additional medicines to help with side effects. Follow all directions for their use. ?You may get drowsy or dizzy. Do not drive, use machinery, or do anything that needs mental alertness until you know how this medicine affects you. Do not stand or sit up quickly, especially if you are an older patient. This reduces the risk of dizzy or fainting spells. ?Call your health care professional for advice if you get a fever, chills, or sore throat, or other symptoms of a cold or flu. Do not treat yourself. This medicine decreases your body's ability to fight infections. Try to avoid being around people who are sick. ?Avoid taking products that contain aspirin, acetaminophen, ibuprofen, naproxen, or ketoprofen unless instructed by your doctor. These medicines may hide a fever. ?This medicine may increase your risk to bruise or bleed. Call your doctor or health care professional if you notice any unusual bleeding. ?Be careful brushing and flossing your teeth or using a toothpick because you may get an infection or bleed more easily. If you have any dental work done, tell your dentist you are receiving this medicine. ?Do not become pregnant while taking this medicine or for  6 months after stopping it. Women should inform their health care professional if they wish to become pregnant or think they might be pregnant. Men should not father a child while taking this medicine and for 3 months after stopping it. There is potential for serious side effects to an unborn child. Talk to your health care professional for more information. ?Do not breast-feed an infant while taking this medicine or for 7 days after stopping it. ?This medicine has caused ovarian failure in some  women. This medicine may make it more difficult to get pregnant. Talk to your health care professional if you are concerned about your fertility. ?This medicine has caused decreased sperm counts in some men. T

## 2022-01-07 NOTE — Progress Notes (Signed)
Patient presents for treatment. RN assessment completed along with the following: ? ?Labs/vitals reviewed - Yes, and please see collab nurse note.    ?Weight within 10% of previous measurement - Yes ?Informed consent completed and reflects current therapy/intent - Yes, on date 06/19/2021             ?Provider progress note reviewed - Yes, today's provider note was reviewed. ?Treatment/Antibody/Supportive plan reviewed - Yes, and there are no adjustments needed for today's treatment. ?S&H and other orders reviewed - Yes, and there are no additional orders identified. ?Previous treatment date reviewed - Yes, and the appropriate amount of time has elapsed between treatments. ?Clinic Hand Off Received from - Yes from [facility], RN ? ?Patient to proceed with treatment.  ? ?

## 2022-01-07 NOTE — Progress Notes (Signed)
Patient seen by Dr. Benay Spice today ? ?Vitals are within treatment parameters. ? ?Labs reviewed by Dr. Benay Spice and are not all within treatment parameters. Elevations in AST/ALT, but ok to treat. ? ?Per physician team, patient is ready for treatment. 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   ?

## 2022-01-09 ENCOUNTER — Inpatient Hospital Stay: Payer: Medicare HMO

## 2022-01-09 VITALS — BP 117/68 | HR 61 | Temp 97.8°F | Resp 18

## 2022-01-09 DIAGNOSIS — C250 Malignant neoplasm of head of pancreas: Secondary | ICD-10-CM

## 2022-01-09 DIAGNOSIS — Z79899 Other long term (current) drug therapy: Secondary | ICD-10-CM | POA: Diagnosis not present

## 2022-01-09 DIAGNOSIS — E785 Hyperlipidemia, unspecified: Secondary | ICD-10-CM | POA: Diagnosis not present

## 2022-01-09 DIAGNOSIS — Z5189 Encounter for other specified aftercare: Secondary | ICD-10-CM | POA: Diagnosis not present

## 2022-01-09 DIAGNOSIS — K219 Gastro-esophageal reflux disease without esophagitis: Secondary | ICD-10-CM | POA: Diagnosis not present

## 2022-01-09 DIAGNOSIS — Z5111 Encounter for antineoplastic chemotherapy: Secondary | ICD-10-CM | POA: Diagnosis not present

## 2022-01-09 DIAGNOSIS — T451X5A Adverse effect of antineoplastic and immunosuppressive drugs, initial encounter: Secondary | ICD-10-CM | POA: Diagnosis not present

## 2022-01-09 DIAGNOSIS — C772 Secondary and unspecified malignant neoplasm of intra-abdominal lymph nodes: Secondary | ICD-10-CM | POA: Diagnosis not present

## 2022-01-09 DIAGNOSIS — I10 Essential (primary) hypertension: Secondary | ICD-10-CM | POA: Diagnosis not present

## 2022-01-09 DIAGNOSIS — R197 Diarrhea, unspecified: Secondary | ICD-10-CM | POA: Diagnosis not present

## 2022-01-09 DIAGNOSIS — G620 Drug-induced polyneuropathy: Secondary | ICD-10-CM | POA: Diagnosis not present

## 2022-01-09 MED ORDER — HEPARIN SOD (PORK) LOCK FLUSH 100 UNIT/ML IV SOLN
500.0000 [IU] | Freq: Once | INTRAVENOUS | Status: AC | PRN
  Administered 2022-01-09: 500 [IU]

## 2022-01-09 MED ORDER — PEGFILGRASTIM-CBQV 6 MG/0.6ML ~~LOC~~ SOSY
6.0000 mg | PREFILLED_SYRINGE | Freq: Once | SUBCUTANEOUS | Status: AC
  Administered 2022-01-09: 6 mg via SUBCUTANEOUS
  Filled 2022-01-09: qty 0.6

## 2022-01-09 MED ORDER — SODIUM CHLORIDE 0.9% FLUSH
10.0000 mL | INTRAVENOUS | Status: DC | PRN
  Administered 2022-01-09: 10 mL

## 2022-01-09 NOTE — Patient Instructions (Signed)

## 2022-01-16 ENCOUNTER — Other Ambulatory Visit (HOSPITAL_COMMUNITY): Payer: Self-pay

## 2022-01-16 ENCOUNTER — Other Ambulatory Visit (HOSPITAL_BASED_OUTPATIENT_CLINIC_OR_DEPARTMENT_OTHER): Payer: Self-pay

## 2022-01-19 ENCOUNTER — Other Ambulatory Visit: Payer: Self-pay | Admitting: Oncology

## 2022-01-21 ENCOUNTER — Encounter: Payer: Self-pay | Admitting: *Deleted

## 2022-01-21 ENCOUNTER — Inpatient Hospital Stay: Payer: Medicare HMO

## 2022-01-21 ENCOUNTER — Other Ambulatory Visit: Payer: Self-pay | Admitting: *Deleted

## 2022-01-21 ENCOUNTER — Inpatient Hospital Stay: Payer: Medicare HMO | Admitting: Nurse Practitioner

## 2022-01-21 ENCOUNTER — Encounter: Payer: Self-pay | Admitting: Nurse Practitioner

## 2022-01-21 VITALS — BP 128/76 | HR 80 | Temp 98.1°F | Resp 18 | Ht 66.0 in | Wt 142.2 lb

## 2022-01-21 DIAGNOSIS — E785 Hyperlipidemia, unspecified: Secondary | ICD-10-CM | POA: Diagnosis not present

## 2022-01-21 DIAGNOSIS — R197 Diarrhea, unspecified: Secondary | ICD-10-CM | POA: Diagnosis not present

## 2022-01-21 DIAGNOSIS — C772 Secondary and unspecified malignant neoplasm of intra-abdominal lymph nodes: Secondary | ICD-10-CM | POA: Diagnosis not present

## 2022-01-21 DIAGNOSIS — C250 Malignant neoplasm of head of pancreas: Secondary | ICD-10-CM | POA: Diagnosis not present

## 2022-01-21 DIAGNOSIS — Z5111 Encounter for antineoplastic chemotherapy: Secondary | ICD-10-CM | POA: Diagnosis not present

## 2022-01-21 DIAGNOSIS — K219 Gastro-esophageal reflux disease without esophagitis: Secondary | ICD-10-CM | POA: Diagnosis not present

## 2022-01-21 DIAGNOSIS — G620 Drug-induced polyneuropathy: Secondary | ICD-10-CM | POA: Diagnosis not present

## 2022-01-21 DIAGNOSIS — Z79899 Other long term (current) drug therapy: Secondary | ICD-10-CM | POA: Diagnosis not present

## 2022-01-21 DIAGNOSIS — Z95828 Presence of other vascular implants and grafts: Secondary | ICD-10-CM

## 2022-01-21 DIAGNOSIS — T451X5A Adverse effect of antineoplastic and immunosuppressive drugs, initial encounter: Secondary | ICD-10-CM | POA: Diagnosis not present

## 2022-01-21 DIAGNOSIS — I10 Essential (primary) hypertension: Secondary | ICD-10-CM | POA: Diagnosis not present

## 2022-01-21 DIAGNOSIS — Z5189 Encounter for other specified aftercare: Secondary | ICD-10-CM | POA: Diagnosis not present

## 2022-01-21 LAB — CMP (CANCER CENTER ONLY)
ALT: 241 U/L — ABNORMAL HIGH (ref 0–44)
AST: 245 U/L (ref 15–41)
Albumin: 4 g/dL (ref 3.5–5.0)
Alkaline Phosphatase: 450 U/L — ABNORMAL HIGH (ref 38–126)
Anion gap: 12 (ref 5–15)
BUN: 15 mg/dL (ref 8–23)
CO2: 23 mmol/L (ref 22–32)
Calcium: 8.7 mg/dL — ABNORMAL LOW (ref 8.9–10.3)
Chloride: 102 mmol/L (ref 98–111)
Creatinine: 1.1 mg/dL (ref 0.61–1.24)
GFR, Estimated: >60 mL/min (ref >60)
Glucose, Bld: 208 mg/dL — ABNORMAL HIGH (ref 70–99)
Potassium: 3.8 mmol/L (ref 3.5–5.1)
Sodium: 137 mmol/L (ref 135–145)
Total Bilirubin: 0.6 mg/dL (ref 0.3–1.2)
Total Protein: 6.5 g/dL (ref 6.5–8.1)

## 2022-01-21 LAB — CBC WITH DIFFERENTIAL (CANCER CENTER ONLY)
Abs Immature Granulocytes: 0.36 10*3/uL — ABNORMAL HIGH (ref 0.00–0.07)
Basophils Absolute: 0.1 10*3/uL (ref 0.0–0.1)
Basophils Relative: 0 %
Eosinophils Absolute: 0.1 10*3/uL (ref 0.0–0.5)
Eosinophils Relative: 1 %
HCT: 37.2 % — ABNORMAL LOW (ref 39.0–52.0)
Hemoglobin: 12 g/dL — ABNORMAL LOW (ref 13.0–17.0)
Immature Granulocytes: 2 %
Lymphocytes Relative: 5 %
Lymphs Abs: 0.8 10*3/uL (ref 0.7–4.0)
MCH: 27.8 pg (ref 26.0–34.0)
MCHC: 32.3 g/dL (ref 30.0–36.0)
MCV: 86.3 fL (ref 80.0–100.0)
Monocytes Absolute: 1.1 10*3/uL — ABNORMAL HIGH (ref 0.1–1.0)
Monocytes Relative: 7 %
Neutro Abs: 14.2 10*3/uL — ABNORMAL HIGH (ref 1.7–7.7)
Neutrophils Relative %: 85 %
Platelet Count: 144 10*3/uL — ABNORMAL LOW (ref 150–400)
RBC: 4.31 MIL/uL (ref 4.22–5.81)
RDW: 17.6 % — ABNORMAL HIGH (ref 11.5–15.5)
WBC Count: 16.7 10*3/uL — ABNORMAL HIGH (ref 4.0–10.5)
nRBC: 0 % (ref 0.0–0.2)

## 2022-01-21 LAB — MAGNESIUM: Magnesium: 1.9 mg/dL (ref 1.7–2.4)

## 2022-01-21 MED ORDER — SODIUM CHLORIDE 0.9% FLUSH
10.0000 mL | INTRAVENOUS | Status: AC | PRN
  Administered 2022-01-21: 10 mL via INTRAVENOUS

## 2022-01-21 MED ORDER — HEPARIN SOD (PORK) LOCK FLUSH 100 UNIT/ML IV SOLN
500.0000 [IU] | Freq: Once | INTRAVENOUS | Status: AC
  Administered 2022-01-21: 500 [IU] via INTRAVENOUS

## 2022-01-21 NOTE — Progress Notes (Signed)
Patient seen by Ned Card NP today  Vitals are within treatment parameters.  Labs reviewed by Ned Card NP and are not all within treatment parameters. AST 245 and ALT 241  Per physician team, patient will not be receiving treatment today.   CRITICAL VALUE STICKER  CRITICAL VALUE: AST 245  RECEIVER (on-site recipient of call):Sharene Krikorian,RN  DATE & TIME NOTIFIED: 01/21/22 @ 0905  MESSENGER (representative from lab):Phyllis  MD NOTIFIED: Ned Card, NP  TIME OF NOTIFICATION: 0907  RESPONSE: Delay treatment 1 week.

## 2022-01-21 NOTE — Progress Notes (Unsigned)
[facility] OFFICE PROGRESS NOTE   Diagnosis: Pancreas cancer  INTERVAL HISTORY:   David Jarvis returns as scheduled.  He completed another cycle of FOLFIRI 01/07/2022.  He denies nausea/vomiting.  No mouth sores.  No significant diarrhea.  This morning he feels like he has "indigestion".  Stable neuropathy symptoms.  No fever.  Objective:  Vital signs in last 24 hours:  Blood pressure 128/76, pulse 80, temperature 98.1 F (36.7 C), temperature source Oral, resp. rate 18, height '5\' 6"'$  (1.676 m), weight 142 lb 3.2 oz (64.5 kg), SpO2 98 %.    HEENT: No thrush or ulcers. Resp: Lungs clear bilaterally. Cardio: Regular rate and rhythm. GI: Abdomen soft and nontender.  No hepatosplenomegaly. Vascular: No leg edema. Neuro: Vibratory sense moderately decreased over the fingertips per tuning fork exam. Skin: Palms without erythema. Port-A-Cath without erythema.   Lab Results:  Lab Results  Component Value Date   WBC 16.7 (H) 01/21/2022   HGB 12.0 (L) 01/21/2022   HCT 37.2 (L) 01/21/2022   MCV 86.3 01/21/2022   PLT 144 (L) 01/21/2022   NEUTROABS 14.2 (H) 01/21/2022    Imaging:  No results found.  Medications: I have reviewed the patient's current medications.  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, stent placed EUS 05/23/2021-25 x 15 mm pancreas head mass with irregular borders, no lymphadenopathy, no vascular involvement, T2 N0 by ultrasound CT chest 06/01/2021-negative for metastatic disease CT abdomen pancreas protocol 06/01/2021-3.2 x 2.3 cm hypoenhancing pancreas head mass inseparable from the third portion of the duodenum, 1 cm left retroperitoneal lymph node, no vascular involvement Cycle 1 FOLFIRINOX 06/19/2021 Cycle 2 FOLFIRINOX  07/02/2021, irinotecan and 5-FU dose reduced, prophylactic dexamethasone added beginning day of pump discontinuation Cycle 3 FOLFIRINOX 07/23/2021, Udenyca CT abdomen/pelvis 08/01/2021-done in the emergency department to evaluate epigastric pain-pancreas head mass decreased in size.  Diffuse gaseous distention of large and small bowel.  No bowel obstruction or focal inflammation. 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  GERD 7.  Squamous cell carcinoma left forearm 2014 8.  Hyperlipidemia 9.  CT chest 08/01/2021, done in the emergency department to evaluate pleuritic chest pain-no PE.  Advanced three-vessel coronary vascular calcification. 10.  Fever of unknown origin beginning 08/14/2021-blood and urine cultures negative, no source for infection identified, placed on antibiotic prophylaxis beginning 08/17/2021, recurrent fever 10/09/2021-cultures negative, completed a course of  Levaquin 11.  Fever/chills on 12/02/2021-urinalysis with clumps of white cells, culture positive for Staphylococcus epidermis and Enterococcus, completed a course of Augmentin  Disposition: David Jarvis appears stable.  He has completed 10 cycles of systemic therapy.  LFTs have increased.  This may be toxicity related to chemotherapy or possibly a bile duct stricture.  We are holding today's treatment and rescheduling for 1 week.  He will return for lab, follow-up, FOLFIRI in the next 7 to 10 days.  We are available to see him sooner if needed.  Patient seen with Dr. Benay Spice.    Ned Card ANP/GNP-BC   01/21/2022  8:49 AM This was a shared visit with Ned Card.  David Jarvis has completed 10 cycles of chemotherapy.  Her enzymes are more elevated today.  The liver enzyme elevation is likely related to hepatotoxicity of chemotherapy, but it is possible he is developing biliary obstruction.  It is less likely the elevated liver enzymes are related to progression of cancer.  We decided to hold chemotherapy today.  I was present for greater than 50% of today's visit.  I performed medical decision making.  Julieanne Manson, MD

## 2022-01-21 NOTE — Addendum Note (Signed)
Addended by: Velna Hatchet on: 01/21/2022 09:37 AM   Modules accepted: Orders

## 2022-01-21 NOTE — Patient Instructions (Signed)
Heparin injection ?What is this medication? ?HEPARIN (HEP a rin) is an anticoagulant. It is used to treat or prevent clots in the veins, arteries, lungs, or heart. It stops clots from forming or getting bigger. This medicine prevents clotting during open-heart surgery, dialysis, or in patients who are confined to bed. ?This medicine may be used for other purposes; ask your health care provider or pharmacist if you have questions. ?COMMON BRAND NAME(S): Hep-Lock, Hep-Lock U/P, Hepflush-10, Monoject Prefill Advanced Heparin Lock Flush, SASH Normal Saline and Heparin ?What should I tell my care team before I take this medication? ?They need to know if you have any of these conditions: ?bleeding disorders, such as hemophilia or low blood platelets ?bowel disease or diverticulitis ?endocarditis ?high blood pressure ?liver disease ?recent surgery or delivery of a baby ?stomach ulcers ?an unusual or allergic reaction to heparin, benzyl alcohol, sulfites, other medicines, foods, dyes, or preservatives ?pregnant or trying to get pregnant ?breast-feeding ?How should I use this medication? ?This medicine is given by injection or infusion into a vein. It can also be given by injection of small amounts under the skin. It is usually given by a health care professional in a hospital or clinic setting. ?If you get this medicine at home, you will be taught how to prepare and give this medicine. Use exactly as directed. Take your medicine at regular intervals. Do not take it more often than directed. Do not stop taking except on your doctor's advice. Stopping this medicine may increase your risk of a blot clot. Be sure to refill your prescription before you run out of medicine. ?It is important that you put your used needles and syringes in a special sharps container. Do not put them in a trash can. If you do not have a sharps container, call your pharmacist or healthcare provider to get one. ?Talk to your pediatrician regarding the  use of this medicine in children. While this medicine may be prescribed for children for selected conditions, precautions do apply. ?Overdosage: If you think you have taken too much of this medicine contact a poison control center or emergency room at once. ?NOTE: This medicine is only for you. Do not share this medicine with others. ?What if I miss a dose? ?If you miss a dose, take it as soon as you can. If it is almost time for your next dose, take only that dose. Do not take double or extra doses. ?What may interact with this medication? ?Do not take this medicine with any of the following medications: ?aspirin and aspirin-like drugs ?mifepristone ?medicines that treat or prevent blood clots like warfarin, enoxaparin, and dalteparin ?palifermin ?protamine ?This medicine may also interact with the following medications: ?dextran ?digoxin ?hydroxychloroquine ?medicines for treating colds or allergies ?nicotine ?NSAIDs, medicines for pain and inflammation, like ibuprofen or naproxen ?phenylbutazone ?tetracycline antibiotics ?This list may not describe all possible interactions. Give your health care provider a list of all the medicines, herbs, non-prescription drugs, or dietary supplements you use. Also tell them if you smoke, drink alcohol, or use illegal drugs. Some items may interact with your medicine. ?What should I watch for while using this medication? ?Visit your healthcare professional for regular checks on your progress. You may need blood work done while you are taking this medicine. Your condition will be monitored carefully while you are receiving this medicine. It is important not to miss any appointments. ?Wear a medical ID bracelet or chain, and carry a card that describes your disease and details   of your medicine and dosage times. ?Notify your doctor or healthcare professional at once if you have cold, blue hands or feet. ?If you are going to need surgery or other procedure, tell your healthcare  professional that you are using this medicine. ?Avoid sports and activities that might cause injury while you are using this medicine. Severe falls or injuries can cause unseen bleeding. Be careful when using sharp tools or knives. Consider using an electric razor. Take special care brushing or flossing your teeth. Report any injuries, bruising, or red spots on the skin to your healthcare professional. ?Using this medicine for a long time may weaken your bones and increase the risk of bone fractures. ?You should make sure that you get enough calcium and vitamin D while you are taking this medicine. Discuss the foods you eat and the vitamins you take with your healthcare professional. ?Wear a medical ID bracelet or chain. Carry a card that describes your disease and details of your medicine and dosage times. ?What side effects may I notice from receiving this medication? ?Side effects that you should report to your doctor or health care professional as soon as possible: ?allergic reactions like skin rash, itching or hives, swelling of the face, lips, or tongue ?bone pain ?fever, chills ?nausea, vomiting ?signs and symptoms of bleeding such as bloody or black, tarry stools; red or dark-brown urine; spitting up blood or brown material that looks like coffee grounds; red spots on the skin; unusual bruising or bleeding from the eye, gums, or nose ?signs and symptoms of a blood clot such as chest pain; shortness of breath; pain, swelling, or warmth in the leg ?signs and symptoms of a stroke such as changes in vision; confusion; trouble speaking or understanding; severe headaches; sudden numbness or weakness of the face, arm or leg; trouble walking; dizziness; loss of coordination ?Side effects that usually do not require medical attention (report to your doctor or health care professional if they continue or are bothersome): ?hair loss ?pain, redness, or irritation at site where injected ?This list may not describe all  possible side effects. Call your doctor for medical advice about side effects. You may report side effects to FDA at 1-800-FDA-1088. ?Where should I keep my medication? ?Keep out of the reach of children. ?Store unopened vials at room temperature between 15 and 30 degrees C (59 and 86 degrees F). Do not freeze. Do not use if solution is discolored or particulate matter is present. Throw away any unused medicine after the expiration date. ?NOTE: This sheet is a summary. It may not cover all possible information. If you have questions about this medicine, talk to your doctor, pharmacist, or health care provider. ?? 2023 Elsevier/Gold Standard (2020-09-28 00:00:00) ? ?

## 2022-01-22 ENCOUNTER — Encounter: Payer: Self-pay | Admitting: Oncology

## 2022-01-23 ENCOUNTER — Inpatient Hospital Stay: Payer: Medicare HMO

## 2022-01-24 DIAGNOSIS — C250 Malignant neoplasm of head of pancreas: Secondary | ICD-10-CM | POA: Diagnosis not present

## 2022-01-31 ENCOUNTER — Inpatient Hospital Stay: Payer: Medicare HMO

## 2022-01-31 ENCOUNTER — Inpatient Hospital Stay: Payer: Medicare HMO | Attending: Oncology

## 2022-01-31 ENCOUNTER — Encounter: Payer: Self-pay | Admitting: *Deleted

## 2022-01-31 ENCOUNTER — Encounter: Payer: Self-pay | Admitting: Nurse Practitioner

## 2022-01-31 ENCOUNTER — Inpatient Hospital Stay: Payer: Medicare HMO | Admitting: Nurse Practitioner

## 2022-01-31 VITALS — BP 142/73 | HR 47 | Temp 96.8°F | Resp 18

## 2022-01-31 VITALS — BP 123/75 | HR 78 | Temp 98.1°F | Resp 18 | Ht 66.0 in | Wt 146.0 lb

## 2022-01-31 DIAGNOSIS — Z5189 Encounter for other specified aftercare: Secondary | ICD-10-CM | POA: Insufficient documentation

## 2022-01-31 DIAGNOSIS — K831 Obstruction of bile duct: Secondary | ICD-10-CM | POA: Diagnosis not present

## 2022-01-31 DIAGNOSIS — G629 Polyneuropathy, unspecified: Secondary | ICD-10-CM | POA: Insufficient documentation

## 2022-01-31 DIAGNOSIS — I10 Essential (primary) hypertension: Secondary | ICD-10-CM | POA: Insufficient documentation

## 2022-01-31 DIAGNOSIS — R7989 Other specified abnormal findings of blood chemistry: Secondary | ICD-10-CM | POA: Insufficient documentation

## 2022-01-31 DIAGNOSIS — C250 Malignant neoplasm of head of pancreas: Secondary | ICD-10-CM

## 2022-01-31 DIAGNOSIS — E785 Hyperlipidemia, unspecified: Secondary | ICD-10-CM | POA: Diagnosis not present

## 2022-01-31 DIAGNOSIS — C772 Secondary and unspecified malignant neoplasm of intra-abdominal lymph nodes: Secondary | ICD-10-CM | POA: Diagnosis not present

## 2022-01-31 DIAGNOSIS — K219 Gastro-esophageal reflux disease without esophagitis: Secondary | ICD-10-CM | POA: Insufficient documentation

## 2022-01-31 DIAGNOSIS — Z85820 Personal history of malignant melanoma of skin: Secondary | ICD-10-CM | POA: Insufficient documentation

## 2022-01-31 DIAGNOSIS — Z5111 Encounter for antineoplastic chemotherapy: Secondary | ICD-10-CM | POA: Insufficient documentation

## 2022-01-31 LAB — CBC WITH DIFFERENTIAL (CANCER CENTER ONLY)
Abs Immature Granulocytes: 0.03 10*3/uL (ref 0.00–0.07)
Basophils Absolute: 0.1 10*3/uL (ref 0.0–0.1)
Basophils Relative: 1 %
Eosinophils Absolute: 0.2 10*3/uL (ref 0.0–0.5)
Eosinophils Relative: 3 %
HCT: 35.2 % — ABNORMAL LOW (ref 39.0–52.0)
Hemoglobin: 11.2 g/dL — ABNORMAL LOW (ref 13.0–17.0)
Immature Granulocytes: 1 %
Lymphocytes Relative: 18 %
Lymphs Abs: 1.2 10*3/uL (ref 0.7–4.0)
MCH: 27.7 pg (ref 26.0–34.0)
MCHC: 31.8 g/dL (ref 30.0–36.0)
MCV: 87.1 fL (ref 80.0–100.0)
Monocytes Absolute: 0.8 10*3/uL (ref 0.1–1.0)
Monocytes Relative: 12 %
Neutro Abs: 4.4 10*3/uL (ref 1.7–7.7)
Neutrophils Relative %: 65 %
Platelet Count: 254 10*3/uL (ref 150–400)
RBC: 4.04 MIL/uL — ABNORMAL LOW (ref 4.22–5.81)
RDW: 18 % — ABNORMAL HIGH (ref 11.5–15.5)
WBC Count: 6.6 10*3/uL (ref 4.0–10.5)
nRBC: 0 % (ref 0.0–0.2)

## 2022-01-31 LAB — CMP (CANCER CENTER ONLY)
ALT: 178 U/L — ABNORMAL HIGH (ref 0–44)
AST: 125 U/L — ABNORMAL HIGH (ref 15–41)
Albumin: 3.9 g/dL (ref 3.5–5.0)
Alkaline Phosphatase: 341 U/L — ABNORMAL HIGH (ref 38–126)
Anion gap: 11 (ref 5–15)
BUN: 17 mg/dL (ref 8–23)
CO2: 26 mmol/L (ref 22–32)
Calcium: 9.2 mg/dL (ref 8.9–10.3)
Chloride: 103 mmol/L (ref 98–111)
Creatinine: 0.97 mg/dL (ref 0.61–1.24)
GFR, Estimated: >60 mL/min (ref >60)
Glucose, Bld: 160 mg/dL — ABNORMAL HIGH (ref 70–99)
Potassium: 3.9 mmol/L (ref 3.5–5.1)
Sodium: 140 mmol/L (ref 135–145)
Total Bilirubin: 0.7 mg/dL (ref 0.3–1.2)
Total Protein: 6.7 g/dL (ref 6.5–8.1)

## 2022-01-31 LAB — MAGNESIUM: Magnesium: 2.1 mg/dL (ref 1.7–2.4)

## 2022-01-31 MED ORDER — SODIUM CHLORIDE 0.9 % IV SOLN
120.0000 mg/m2 | Freq: Once | INTRAVENOUS | Status: AC
  Administered 2022-01-31: 220 mg via INTRAVENOUS
  Filled 2022-01-31: qty 9

## 2022-01-31 MED ORDER — SODIUM CHLORIDE 0.9 % IV SOLN
10.0000 mg | Freq: Once | INTRAVENOUS | Status: AC
  Administered 2022-01-31: 10 mg via INTRAVENOUS
  Filled 2022-01-31: qty 10

## 2022-01-31 MED ORDER — SODIUM CHLORIDE 0.9 % IV SOLN
2000.0000 mg/m2 | INTRAVENOUS | Status: DC
  Administered 2022-01-31: 3500 mg via INTRAVENOUS
  Filled 2022-01-31: qty 70

## 2022-01-31 MED ORDER — ATROPINE SULFATE 1 MG/ML IV SOLN
0.5000 mg | Freq: Once | INTRAVENOUS | Status: AC | PRN
  Administered 2022-01-31: 0.5 mg via INTRAVENOUS
  Filled 2022-01-31: qty 1

## 2022-01-31 MED ORDER — DEXTROSE 5 % IV SOLN: Freq: Once | INTRAVENOUS | Status: AC

## 2022-01-31 MED ORDER — SODIUM CHLORIDE 0.9 % IV SOLN
400.0000 mg/m2 | Freq: Once | INTRAVENOUS | Status: AC
  Administered 2022-01-31: 704 mg via INTRAVENOUS
  Filled 2022-01-31: qty 35.2

## 2022-01-31 MED ORDER — SODIUM CHLORIDE 0.9 % IV SOLN
150.0000 mg | Freq: Once | INTRAVENOUS | Status: AC
  Administered 2022-01-31: 150 mg via INTRAVENOUS
  Filled 2022-01-31: qty 150

## 2022-01-31 MED ORDER — PALONOSETRON HCL INJECTION 0.25 MG/5ML
0.2500 mg | Freq: Once | INTRAVENOUS | Status: AC
  Administered 2022-01-31: 0.25 mg via INTRAVENOUS
  Filled 2022-01-31: qty 5

## 2022-01-31 NOTE — Progress Notes (Signed)
Patient seen by Ned Card NP today  Vitals are within treatment parameters.  Labs reviewed by Ned Card NP and are not all within treatment parameters. AST/ALT remain elevated (improved)  Per physician team, patient is ready for treatment. Please note that modifications are being made to the treatment plan including Will hold oxaliplatin again and proceed with FOLFIRI

## 2022-01-31 NOTE — Progress Notes (Signed)
[place] OFFICE PROGRESS NOTE   Diagnosis: Pancreas cancer  INTERVAL HISTORY:   David Jarvis returns as scheduled.  FOLFIRI was held on 01/21/2022 due to elevated LFTs.  He overall feels well.  No nausea or vomiting.  No diarrhea.  No fever.  No pruritus.  Stable neuropathy symptoms.  Objective:  Vital signs in last 24 hours:  Blood pressure 123/75, pulse 78, temperature 98.1 F (36.7 C), temperature source Oral, resp. rate 18, height '5\' 6"'$  (1.676 m), weight 146 lb (66.2 kg), SpO2 98 %.    HEENT: No thrush or ulcers. Resp: Lungs clear bilaterally. Cardio: Regular rate and rhythm. GI: No hepatosplenomegaly. Vascular: No leg edema. Skin: Palms without erythema. Port-A-Cath without erythema.   Lab Results:  Lab Results  Component Value Date   WBC 6.6 01/31/2022   HGB 11.2 (L) 01/31/2022   HCT 35.2 (L) 01/31/2022   MCV 87.1 01/31/2022   PLT 254 01/31/2022   NEUTROABS 4.4 01/31/2022    Imaging:  No results found.  Medications: I have reviewed the patient's current medications.  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, stent placed EUS 05/23/2021-25 x 15 mm pancreas head mass with irregular borders, no lymphadenopathy, no vascular involvement, T2 N0 by ultrasound CT chest 06/01/2021-negative for metastatic disease CT abdomen pancreas protocol 06/01/2021-3.2 x 2.3 cm hypoenhancing pancreas head mass inseparable from the third portion of the duodenum, 1 cm left retroperitoneal lymph node, no vascular involvement Cycle 1 FOLFIRINOX 06/19/2021 Cycle 2 FOLFIRINOX 07/02/2021, irinotecan and 5-FU dose reduced, prophylactic dexamethasone added beginning day of pump discontinuation Cycle 3 FOLFIRINOX 07/23/2021, Udenyca CT abdomen/pelvis  08/01/2021-done in the emergency department to evaluate epigastric pain-pancreas head mass decreased in size.  Diffuse gaseous distention of large and small bowel.  No bowel obstruction or focal inflammation. 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  GERD 7.  Squamous cell carcinoma left forearm 2014 8.  Hyperlipidemia 9.  CT chest 08/01/2021, done in the emergency department to evaluate pleuritic chest pain-no PE.  Advanced three-vessel coronary vascular calcification. 10.  Fever of unknown origin beginning 08/14/2021-blood and urine cultures negative, no source for infection identified, placed on antibiotic prophylaxis beginning 08/17/2021, recurrent fever 10/09/2021-cultures negative, completed a course of Levaquin 11.  Fever/chills on 12/02/2021-urinalysis with clumps of white cells, culture positive for  Staphylococcus epidermis and Enterococcus, completed a course of Augmentin    Disposition: David Jarvis appears stable.  He has completed 10 cycles of systemic  therapy, currently on active treatment with FOLFIRI.  Chemotherapy was held last week due to elevated LFTs.  LFTs are better today.  I discussed labs from today with Dr. Benay Spice and [place] pharmacist.  Plan to proceed with FOLFIRI today as scheduled, no dose adjustment required.  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  He will return for lab, follow-up, chemotherapy in 2 weeks.  We are available to see him sooner if needed.    Ned Card ANP/GNP-BC   01/31/2022  8:39 AM

## 2022-01-31 NOTE — Patient Instructions (Signed)
David Jarvis   Discharge Instructions: Thank you for choosing [facility] to provide your oncology and hematology care.   If you have a lab appointment with the [facility], please go directly to the [facility] and check in at the registration area.   Wear comfortable clothing and clothing appropriate for easy access to any Portacath or PICC line.   We strive to give you quality time with your provider. You may need to reschedule your appointment if you arrive late (15 or more minutes).  Arriving late affects you and other patients whose appointments are after yours.  Also, if you miss three or more appointments without notifying the office, you may be dismissed from the clinic at the provider's discretion.      For prescription refill requests, have your pharmacy contact our office and allow 72 hours for refills to be completed.    Today you received the following chemotherapy and/or immunotherapy agents Leucovorin, Irinotecan (CAMPTOSAR) & Flourouracil (ADRUCIL).      To help prevent nausea and vomiting after your treatment, we encourage you to take your nausea medication as directed.  BELOW ARE SYMPTOMS THAT SHOULD BE REPORTED IMMEDIATELY: *FEVER GREATER THAN 100.4 F (38 C) OR HIGHER *CHILLS OR SWEATING *NAUSEA AND VOMITING THAT IS NOT CONTROLLED WITH YOUR NAUSEA MEDICATION *UNUSUAL SHORTNESS OF BREATH *UNUSUAL BRUISING OR BLEEDING *URINARY PROBLEMS (pain or burning when urinating, or frequent urination) *BOWEL PROBLEMS (unusual diarrhea, constipation, pain near the anus) TENDERNESS IN MOUTH AND THROAT WITH OR WITHOUT PRESENCE OF ULCERS (sore throat, sores in mouth, or a toothache) UNUSUAL RASH, SWELLING OR PAIN  UNUSUAL VAGINAL DISCHARGE OR ITCHING   Items with * indicate a potential emergency and should be followed up as soon as possible or go to the Emergency Department if any problems should occur.  Please show the CHEMOTHERAPY ALERT  CARD or IMMUNOTHERAPY ALERT CARD at check-in to the Emergency Department and triage nurse.  Should you have questions after your visit or need to cancel or reschedule your appointment, please contact [facility]  Dept: [phone]  and follow the prompts.  Office hours are 8:00 a.m. to 4:30 p.m. Monday - Friday. Please note that voicemails left after 4:00 p.m. may not be returned until the following business day.  We are closed weekends and major holidays. You have access to a nurse at all times for urgent questions. Please call the main number to the clinic Dept: [phone] and follow the prompts.   For any non-urgent questions, you may also contact your provider using MyChart. We now offer e-Visits for anyone 62 and older to request care online for non-urgent symptoms. For details visit [email or web address].   Also download the MyChart app! Go to the app store, search "MyChart", open the app, select [facility], and log in with your MyChart username and password.  Due to Covid, a mask is required upon entering the hospital/clinic. If you do not have a mask, one will be given to you upon arrival. For doctor visits, patients may have 1 support person aged 12 or older with them. For treatment visits, patients cannot have anyone with them due to current Covid guidelines and our immunocompromised population.   Irinotecan injection What is this medication? IRINOTECAN (ir in oh TEE kan ) is a chemotherapy drug. It is used to treat colon and rectal cancer. This medicine may be used for other purposes; ask your health care provider or pharmacist if you  have questions. COMMON BRAND NAME(S): Camptosar What should I tell my care team before I take this medication? They need to know if you have any of these conditions: dehydration diarrhea infection (especially a virus infection such as chickenpox, cold sores, or herpes) liver disease low blood counts, like low white cell,  platelet, or red cell counts low levels of calcium, magnesium, or potassium in the blood recent or ongoing radiation therapy an unusual or allergic reaction to irinotecan, other medicines, foods, dyes, or preservatives pregnant or trying to get pregnant breast-feeding How should I use this medication? This drug is given as an infusion into a vein. It is administered in a hospital or clinic by a specially trained health care professional. Talk to your pediatrician regarding the use of this medicine in children. Special care may be needed. Overdosage: If you think you have taken too much of this medicine contact a poison control center or emergency room at once. NOTE: This medicine is only for you. Do not share this medicine with others. What if I miss a dose? It is important not to miss your dose. Call your doctor or health care professional if you are unable to keep an appointment. What may interact with this medication? Do not take this medicine with any of the following medications: cobicistat itraconazole This medicine may interact with the following medications: antiviral medicines for HIV or AIDS certain antibiotics like rifampin or rifabutin certain medicines for fungal infections like ketoconazole, posaconazole, and voriconazole certain medicines for seizures like carbamazepine, phenobarbital, phenotoin clarithromycin gemfibrozil nefazodone [place]'s Wort This list may not describe all possible interactions. Give your health care provider a list of all the medicines, herbs, non-prescription drugs, or dietary supplements you use. Also tell them if you smoke, drink alcohol, or use illegal drugs. Some items may interact with your medicine. What should I watch for while using this medication? Your condition will be monitored carefully while you are receiving this medicine. You will need important blood work done while you are taking this medicine. This drug may make you feel  generally unwell. This is not uncommon, as chemotherapy can affect healthy cells as well as cancer cells. Report any side effects. Continue your course of treatment even though you feel ill unless your doctor tells you to stop. In some cases, you may be given additional medicines to help with side effects. Follow all directions for their use. You may get drowsy or dizzy. Do not drive, use machinery, or do anything that needs mental alertness until you know how this medicine affects you. Do not stand or sit up quickly, especially if you are an older patient. This reduces the risk of dizzy or fainting spells. Call your health care professional for advice if you get a fever, chills, or sore throat, or other symptoms of a cold or flu. Do not treat yourself. This medicine decreases your body's ability to fight infections. Try to avoid being around people who are sick. Avoid taking products that contain aspirin, acetaminophen, ibuprofen, naproxen, or ketoprofen unless instructed by your doctor. These medicines may hide a fever. This medicine may increase your risk to bruise or bleed. Call your doctor or health care professional if you notice any unusual bleeding. Be careful brushing and flossing your teeth or using a toothpick because you may get an infection or bleed more easily. If you have any dental work done, tell your dentist you are receiving this medicine. Do not become pregnant while taking this medicine or for   6 months after stopping it. Women should inform their health care professional if they wish to become pregnant or think they might be pregnant. Men should not father a child while taking this medicine and for 3 months after stopping it. There is potential for serious side effects to an unborn child. Talk to your health care professional for more information. Do not breast-feed an infant while taking this medicine or for 7 days after stopping it. This medicine has caused ovarian failure in some  women. This medicine may make it more difficult to get pregnant. Talk to your health care professional if you are concerned about your fertility. This medicine has caused decreased sperm counts in some men. This may make it more difficult to father a child. Talk to your health care professional if you are concerned about your fertility. What side effects may I notice from receiving this medication? Side effects that you should report to your doctor or health care professional as soon as possible: allergic reactions like skin rash, itching or hives, swelling of the face, lips, or tongue chest pain diarrhea flushing, runny nose, sweating during infusion low blood counts - this medicine may decrease the number of white blood cells, red blood cells and platelets. You may be at increased risk for infections and bleeding. nausea, vomiting pain, swelling, warmth in the leg signs of decreased platelets or bleeding - bruising, pinpoint red spots on the skin, black, tarry stools, blood in the urine signs of infection - fever or chills, cough, sore throat, pain or difficulty passing urine signs of decreased red blood cells - unusually weak or tired, fainting spells, lightheadedness Side effects that usually do not require medical attention (report to your doctor or health care professional if they continue or are bothersome): constipation hair loss headache loss of appetite mouth sores stomach pain This list may not describe all possible side effects. Call your doctor for medical advice about side effects. You may report side effects to FDA at 1-800-FDA-1088. Where should I keep my medication? This drug is given in a hospital or clinic and will not be stored at home. NOTE: This sheet is a summary. It may not cover all possible information. If you have questions about this medicine, talk to your doctor, pharmacist, or health care provider.  2023 Elsevier/Gold Standard (2021-07-20  00:00:00)  Leucovorin injection What is this medication? LEUCOVORIN (loo koe VOR in) is used to prevent or treat the harmful effects of some medicines. This medicine is used to treat anemia caused by a low amount of folic acid in the body. It is also used with 5-fluorouracil (5-FU) to treat colon cancer. This medicine may be used for other purposes; ask your health care provider or pharmacist if you have questions. What should I tell my care team before I take this medication? They need to know if you have any of these conditions: anemia from low levels of vitamin B-12 in the blood an unusual or allergic reaction to leucovorin, folic acid, other medicines, foods, dyes, or preservatives pregnant or trying to get pregnant breast-feeding How should I use this medication? This medicine is for injection into a muscle or into a vein. It is given by a health care professional in a hospital or clinic setting. Talk to your pediatrician regarding the use of this medicine in children. Special care may be needed. Overdosage: If you think you have taken too much of this medicine contact a poison control center or emergency room at once. NOTE: This  medicine is only for you. Do not share this medicine with others. What if I miss a dose? This does not apply. What may interact with this medication? capecitabine fluorouracil phenobarbital phenytoin primidone trimethoprim-sulfamethoxazole This list may not describe all possible interactions. Give your health care provider a list of all the medicines, herbs, non-prescription drugs, or dietary supplements you use. Also tell them if you smoke, drink alcohol, or use illegal drugs. Some items may interact with your medicine. What should I watch for while using this medication? Your condition will be monitored carefully while you are receiving this medicine. This medicine may increase the side effects of 5-fluorouracil, 5-FU. Tell your doctor or health care  professional if you have diarrhea or mouth sores that do not get better or that get worse. What side effects may I notice from receiving this medication? Side effects that you should report to your doctor or health care professional as soon as possible: allergic reactions like skin rash, itching or hives, swelling of the face, lips, or tongue breathing problems fever, infection mouth sores unusual bleeding or bruising unusually weak or tired Side effects that usually do not require medical attention (report to your doctor or health care professional if they continue or are bothersome): constipation or diarrhea loss of appetite nausea, vomiting This list may not describe all possible side effects. Call your doctor for medical advice about side effects. You may report side effects to FDA at 1-800-FDA-1088. Where should I keep my medication? This drug is given in a hospital or clinic and will not be stored at home. NOTE: This sheet is a summary. It may not cover all possible information. If you have questions about this medicine, talk to your doctor, pharmacist, or health care provider.  2023 Elsevier/Gold Standard (2008-02-25 00:00:00)  Fluorouracil, 5-FU injection What is this medication? FLUOROURACIL, 5-FU (flure oh YOOR a sil) is a chemotherapy drug. It slows the growth of cancer cells. This medicine is used to treat many types of cancer like breast cancer, colon or rectal cancer, pancreatic cancer, and stomach cancer. This medicine may be used for other purposes; ask your health care provider or pharmacist if you have questions. COMMON BRAND NAME(S): Adrucil What should I tell my care team before I take this medication? They need to know if you have any of these conditions: blood disorders dihydropyrimidine dehydrogenase (DPD) deficiency infection (especially a virus infection such as chickenpox, cold sores, or herpes) kidney disease liver disease malnourished, poor  nutrition recent or ongoing radiation therapy an unusual or allergic reaction to fluorouracil, other chemotherapy, other medicines, foods, dyes, or preservatives pregnant or trying to get pregnant breast-feeding How should I use this medication? This drug is given as an infusion or injection into a vein. It is administered in a hospital or clinic by a specially trained health care professional. Talk to your pediatrician regarding the use of this medicine in children. Special care may be needed. Overdosage: If you think you have taken too much of this medicine contact a poison control center or emergency room at once. NOTE: This medicine is only for you. Do not share this medicine with others. What if I miss a dose? It is important not to miss your dose. Call your doctor or health care professional if you are unable to keep an appointment. What may interact with this medication? Do not take this medicine with any of the following medications: live virus vaccines This medicine may also interact with the following medications: medicines that treat  or prevent blood clots like warfarin, enoxaparin, and dalteparin This list may not describe all possible interactions. Give your health care provider a list of all the medicines, herbs, non-prescription drugs, or dietary supplements you use. Also tell them if you smoke, drink alcohol, or use illegal drugs. Some items may interact with your medicine. What should I watch for while using this medication? Visit your doctor for checks on your progress. This drug may make you feel generally unwell. This is not uncommon, as chemotherapy can affect healthy cells as well as cancer cells. Report any side effects. Continue your course of treatment even though you feel ill unless your doctor tells you to stop. In some cases, you may be given additional medicines to help with side effects. Follow all directions for their use. Call your doctor or health care  professional for advice if you get a fever, chills or sore throat, or other symptoms of a cold or flu. Do not treat yourself. This drug decreases your body's ability to fight infections. Try to avoid being around people who are sick. This medicine may increase your risk to bruise or bleed. Call your doctor or health care professional if you notice any unusual bleeding. Be careful brushing and flossing your teeth or using a toothpick because you may get an infection or bleed more easily. If you have any dental work done, tell your dentist you are receiving this medicine. Avoid taking products that contain aspirin, acetaminophen, ibuprofen, naproxen, or ketoprofen unless instructed by your doctor. These medicines may hide a fever. Do not become pregnant while taking this medicine. Women should inform their doctor if they wish to become pregnant or think they might be pregnant. There is a potential for serious side effects to an unborn child. Talk to your health care professional or pharmacist for more information. Do not breast-feed an infant while taking this medicine. Men should inform their doctor if they wish to father a child. This medicine may lower sperm counts. Do not treat diarrhea with over the counter products. Contact your doctor if you have diarrhea that lasts more than 2 days or if it is severe and watery. This medicine can make you more sensitive to the sun. Keep out of the sun. If you cannot avoid being in the sun, wear protective clothing and use sunscreen. Do not use sun lamps or tanning beds/booths. What side effects may I notice from receiving this medication? Side effects that you should report to your doctor or health care professional as soon as possible: allergic reactions like skin rash, itching or hives, swelling of the face, lips, or tongue low blood counts - this medicine may decrease the number of white blood cells, red blood cells and platelets. You may be at increased risk for  infections and bleeding. signs of infection - fever or chills, cough, sore throat, pain or difficulty passing urine signs of decreased platelets or bleeding - bruising, pinpoint red spots on the skin, black, tarry stools, blood in the urine signs of decreased red blood cells - unusually weak or tired, fainting spells, lightheadedness breathing problems changes in vision chest pain mouth sores nausea and vomiting pain, swelling, redness at site where injected pain, tingling, numbness in the hands or feet redness, swelling, or sores on hands or feet stomach pain unusual bleeding Side effects that usually do not require medical attention (report to your doctor or health care professional if they continue or are bothersome): changes in finger or toe nails diarrhea dry or   itchy skin hair loss headache loss of appetite sensitivity of eyes to the light stomach upset unusually teary eyes This list may not describe all possible side effects. Call your doctor for medical advice about side effects. You may report side effects to FDA at 1-800-FDA-1088. Where should I keep my medication? This drug is given in a hospital or clinic and will not be stored at home. NOTE: This sheet is a summary. It may not cover all possible information. If you have questions about this medicine, talk to your doctor, pharmacist, or health care provider.  2023 Elsevier/Gold Standard (2021-07-20 00:00:00)  The chemotherapy medication bag should finish at 46 hours, 96 hours, or 7 days. For example, if your pump is scheduled for 46 hours and it was put on at 4:00 p.m., it should finish at 2:00 p.m. the day it is scheduled to come off regardless of your appointment time.     Estimated time to finish at 10:30 on Wednesday 01/09/2022.   If the display on your pump reads "Low Volume" and it is beeping, take the batteries out of the pump and come to the cancer center for it to be taken off.   If the pump alarms go off  prior to the pump reading "Low Volume" then call [phone] and someone can assist you.  If the plunger comes out and the chemotherapy medication is leaking out, please use your home chemo spill kit to clean up the spill. Do NOT use paper towels or other household products.  If you have problems or questions regarding your pump, please call either [phone] (24 hours a day) or the cancer center Monday-Friday 8:00 a.m.- 4:30 p.m. at the clinic number and we will assist you. If you are unable to get assistance, then go to the nearest Emergency Department and ask the staff to contact the IV team for assistance.

## 2022-01-31 NOTE — Patient Instructions (Signed)

## 2022-02-02 ENCOUNTER — Inpatient Hospital Stay: Payer: Medicare HMO

## 2022-02-02 VITALS — BP 166/73 | HR 65 | Temp 98.4°F | Resp 17

## 2022-02-02 DIAGNOSIS — Z85820 Personal history of malignant melanoma of skin: Secondary | ICD-10-CM | POA: Diagnosis not present

## 2022-02-02 DIAGNOSIS — Z5111 Encounter for antineoplastic chemotherapy: Secondary | ICD-10-CM | POA: Diagnosis not present

## 2022-02-02 DIAGNOSIS — R7989 Other specified abnormal findings of blood chemistry: Secondary | ICD-10-CM | POA: Diagnosis not present

## 2022-02-02 DIAGNOSIS — C250 Malignant neoplasm of head of pancreas: Secondary | ICD-10-CM | POA: Diagnosis not present

## 2022-02-02 DIAGNOSIS — E785 Hyperlipidemia, unspecified: Secondary | ICD-10-CM | POA: Diagnosis not present

## 2022-02-02 DIAGNOSIS — K831 Obstruction of bile duct: Secondary | ICD-10-CM | POA: Diagnosis not present

## 2022-02-02 DIAGNOSIS — Z5189 Encounter for other specified aftercare: Secondary | ICD-10-CM | POA: Diagnosis not present

## 2022-02-02 DIAGNOSIS — I10 Essential (primary) hypertension: Secondary | ICD-10-CM | POA: Diagnosis not present

## 2022-02-02 DIAGNOSIS — K219 Gastro-esophageal reflux disease without esophagitis: Secondary | ICD-10-CM | POA: Diagnosis not present

## 2022-02-02 DIAGNOSIS — C772 Secondary and unspecified malignant neoplasm of intra-abdominal lymph nodes: Secondary | ICD-10-CM | POA: Diagnosis not present

## 2022-02-02 DIAGNOSIS — G629 Polyneuropathy, unspecified: Secondary | ICD-10-CM | POA: Diagnosis not present

## 2022-02-02 MED ORDER — HEPARIN SOD (PORK) LOCK FLUSH 100 UNIT/ML IV SOLN
500.0000 [IU] | Freq: Once | INTRAVENOUS | Status: AC | PRN
  Administered 2022-02-02: 500 [IU]

## 2022-02-02 MED ORDER — SODIUM CHLORIDE 0.9% FLUSH
10.0000 mL | INTRAVENOUS | Status: DC | PRN
  Administered 2022-02-02: 10 mL

## 2022-02-02 MED ORDER — PEGFILGRASTIM-CBQV 6 MG/0.6ML ~~LOC~~ SOSY
6.0000 mg | PREFILLED_SYRINGE | Freq: Once | SUBCUTANEOUS | Status: AC
  Administered 2022-02-02: 6 mg via SUBCUTANEOUS
  Filled 2022-02-02: qty 0.6

## 2022-02-04 ENCOUNTER — Inpatient Hospital Stay: Payer: Medicare HMO

## 2022-02-04 ENCOUNTER — Inpatient Hospital Stay: Payer: Medicare HMO | Admitting: Oncology

## 2022-02-06 ENCOUNTER — Inpatient Hospital Stay: Payer: Medicare HMO

## 2022-02-10 ENCOUNTER — Other Ambulatory Visit: Payer: Self-pay | Admitting: Oncology

## 2022-02-13 ENCOUNTER — Inpatient Hospital Stay: Payer: Medicare HMO

## 2022-02-13 ENCOUNTER — Inpatient Hospital Stay: Payer: Medicare HMO | Admitting: Oncology

## 2022-02-13 ENCOUNTER — Other Ambulatory Visit: Payer: Self-pay

## 2022-02-13 VITALS — BP 138/81 | HR 83 | Temp 98.2°F | Resp 18 | Ht 66.0 in | Wt 144.2 lb

## 2022-02-13 DIAGNOSIS — K219 Gastro-esophageal reflux disease without esophagitis: Secondary | ICD-10-CM | POA: Diagnosis not present

## 2022-02-13 DIAGNOSIS — Z5189 Encounter for other specified aftercare: Secondary | ICD-10-CM | POA: Diagnosis not present

## 2022-02-13 DIAGNOSIS — R7989 Other specified abnormal findings of blood chemistry: Secondary | ICD-10-CM | POA: Diagnosis not present

## 2022-02-13 DIAGNOSIS — C250 Malignant neoplasm of head of pancreas: Secondary | ICD-10-CM

## 2022-02-13 DIAGNOSIS — I10 Essential (primary) hypertension: Secondary | ICD-10-CM | POA: Diagnosis not present

## 2022-02-13 DIAGNOSIS — K831 Obstruction of bile duct: Secondary | ICD-10-CM | POA: Diagnosis not present

## 2022-02-13 DIAGNOSIS — G629 Polyneuropathy, unspecified: Secondary | ICD-10-CM | POA: Diagnosis not present

## 2022-02-13 DIAGNOSIS — Z5111 Encounter for antineoplastic chemotherapy: Secondary | ICD-10-CM | POA: Diagnosis not present

## 2022-02-13 DIAGNOSIS — Z85820 Personal history of malignant melanoma of skin: Secondary | ICD-10-CM | POA: Diagnosis not present

## 2022-02-13 DIAGNOSIS — E785 Hyperlipidemia, unspecified: Secondary | ICD-10-CM | POA: Diagnosis not present

## 2022-02-13 DIAGNOSIS — C772 Secondary and unspecified malignant neoplasm of intra-abdominal lymph nodes: Secondary | ICD-10-CM | POA: Diagnosis not present

## 2022-02-13 LAB — CMP (CANCER CENTER ONLY)
ALT: 78 U/L — ABNORMAL HIGH (ref 0–44)
AST: 48 U/L — ABNORMAL HIGH (ref 15–41)
Albumin: 4.1 g/dL (ref 3.5–5.0)
Alkaline Phosphatase: 310 U/L — ABNORMAL HIGH (ref 38–126)
Anion gap: 11 (ref 5–15)
BUN: 13 mg/dL (ref 8–23)
CO2: 24 mmol/L (ref 22–32)
Calcium: 9.3 mg/dL (ref 8.9–10.3)
Chloride: 106 mmol/L (ref 98–111)
Creatinine: 1.02 mg/dL (ref 0.61–1.24)
GFR, Estimated: >60 mL/min (ref >60)
Glucose, Bld: 105 mg/dL — ABNORMAL HIGH (ref 70–99)
Potassium: 3.9 mmol/L (ref 3.5–5.1)
Sodium: 141 mmol/L (ref 135–145)
Total Bilirubin: 0.5 mg/dL (ref 0.3–1.2)
Total Protein: 6.3 g/dL — ABNORMAL LOW (ref 6.5–8.1)

## 2022-02-13 LAB — CBC WITH DIFFERENTIAL (CANCER CENTER ONLY)
Abs Immature Granulocytes: 0.18 10*3/uL — ABNORMAL HIGH (ref 0.00–0.07)
Basophils Absolute: 0 10*3/uL (ref 0.0–0.1)
Basophils Relative: 0 %
Eosinophils Absolute: 0.2 10*3/uL (ref 0.0–0.5)
Eosinophils Relative: 2 %
HCT: 34 % — ABNORMAL LOW (ref 39.0–52.0)
Hemoglobin: 10.7 g/dL — ABNORMAL LOW (ref 13.0–17.0)
Immature Granulocytes: 2 %
Lymphocytes Relative: 19 %
Lymphs Abs: 1.7 10*3/uL (ref 0.7–4.0)
MCH: 28.5 pg (ref 26.0–34.0)
MCHC: 31.5 g/dL (ref 30.0–36.0)
MCV: 90.4 fL (ref 80.0–100.0)
Monocytes Absolute: 0.6 10*3/uL (ref 0.1–1.0)
Monocytes Relative: 7 %
Neutro Abs: 6.3 10*3/uL (ref 1.7–7.7)
Neutrophils Relative %: 70 %
Platelet Count: 157 10*3/uL (ref 150–400)
RBC: 3.76 MIL/uL — ABNORMAL LOW (ref 4.22–5.81)
RDW: 19.4 % — ABNORMAL HIGH (ref 11.5–15.5)
WBC Count: 8.9 10*3/uL (ref 4.0–10.5)
nRBC: 0 % (ref 0.0–0.2)

## 2022-02-13 MED ORDER — SODIUM CHLORIDE 0.9 % IV SOLN
400.0000 mg/m2 | Freq: Once | INTRAVENOUS | Status: AC
  Administered 2022-02-13: 704 mg via INTRAVENOUS
  Filled 2022-02-13: qty 35.2

## 2022-02-13 MED ORDER — SODIUM CHLORIDE 0.9 % IV SOLN
10.0000 mg | Freq: Once | INTRAVENOUS | Status: AC
  Administered 2022-02-13: 10 mg via INTRAVENOUS
  Filled 2022-02-13: qty 1

## 2022-02-13 MED ORDER — SODIUM CHLORIDE 0.9 % IV SOLN
120.0000 mg/m2 | Freq: Once | INTRAVENOUS | Status: AC
  Administered 2022-02-13: 220 mg via INTRAVENOUS
  Filled 2022-02-13: qty 10

## 2022-02-13 MED ORDER — ATROPINE SULFATE 1 MG/ML IV SOLN
0.5000 mg | Freq: Once | INTRAVENOUS | Status: AC | PRN
  Administered 2022-02-13: 0.5 mg via INTRAVENOUS
  Filled 2022-02-13: qty 1

## 2022-02-13 MED ORDER — PALONOSETRON HCL INJECTION 0.25 MG/5ML
0.2500 mg | Freq: Once | INTRAVENOUS | Status: AC
  Administered 2022-02-13: 0.25 mg via INTRAVENOUS
  Filled 2022-02-13: qty 5

## 2022-02-13 MED ORDER — SODIUM CHLORIDE 0.9 % IV SOLN
2000.0000 mg/m2 | INTRAVENOUS | Status: DC
  Administered 2022-02-13: 3500 mg via INTRAVENOUS
  Filled 2022-02-13: qty 70

## 2022-02-13 MED ORDER — SODIUM CHLORIDE 0.9 % IV SOLN
150.0000 mg | Freq: Once | INTRAVENOUS | Status: AC
  Administered 2022-02-13: 150 mg via INTRAVENOUS
  Filled 2022-02-13: qty 5

## 2022-02-13 MED ORDER — SODIUM CHLORIDE 0.9 % IV SOLN: INTRAVENOUS | Status: DC

## 2022-02-13 NOTE — Progress Notes (Signed)
Patient seen by Dr. Benay Spice today  Vitals are within treatment parameters.  Labs reviewed by Dr. Benay Spice and are within treatment parameters.  Per physician team, patient is ready for treatment. Please note that modifications are being made to the treatment plan including Oxaliplatin and Udenyca omitted from treatment plan

## 2022-02-13 NOTE — Patient Instructions (Signed)
[facility]   Discharge Instructions: Thank you for choosing [facility] to provide your oncology and hematology care.   If you have a lab appointment with the [facility], please go directly to the [facility] and check in at the registration area.   Wear comfortable clothing and clothing appropriate for easy access to any Portacath or PICC line.   We strive to give you quality time with your provider. You may need to reschedule your appointment if you arrive late (15 or more minutes).  Arriving late affects you and other patients whose appointments are after yours.  Also, if you miss three or more appointments without notifying the office, you may be dismissed from the clinic at the provider's discretion.      For prescription refill requests, have your pharmacy contact our office and allow 72 hours for refills to be completed.    Today you received the following chemotherapy and/or immunotherapy agents Irinotecan (CAMPTOSAR), Leucovorin & Flourouracil (ADRUCIL).      To help prevent nausea and vomiting after your treatment, we encourage you to take your nausea medication as directed.  BELOW ARE SYMPTOMS THAT SHOULD BE REPORTED IMMEDIATELY: *FEVER GREATER THAN 100.4 F (38 C) OR HIGHER *CHILLS OR SWEATING *NAUSEA AND VOMITING THAT IS NOT CONTROLLED WITH YOUR NAUSEA MEDICATION *UNUSUAL SHORTNESS OF BREATH *UNUSUAL BRUISING OR BLEEDING *URINARY PROBLEMS (pain or burning when urinating, or frequent urination) *BOWEL PROBLEMS (unusual diarrhea, constipation, pain near the anus) TENDERNESS IN MOUTH AND THROAT WITH OR WITHOUT PRESENCE OF ULCERS (sore throat, sores in mouth, or a toothache) UNUSUAL RASH, SWELLING OR PAIN  UNUSUAL VAGINAL DISCHARGE OR ITCHING   Items with * indicate a potential emergency and should be followed up as soon as possible or go to the Emergency Department if any problems should occur.  Please show the CHEMOTHERAPY ALERT  CARD or IMMUNOTHERAPY ALERT CARD at check-in to the Emergency Department and triage nurse.  Should you have questions after your visit or need to cancel or reschedule your appointment, please contact [facility]  Dept: [phone]  and follow the prompts.  Office hours are 8:00 a.m. to 4:30 p.m. Monday - Friday. Please note that voicemails left after 4:00 p.m. may not be returned until the following business day.  We are closed weekends and major holidays. You have access to a nurse at all times for urgent questions. Please call the main number to the clinic Dept: [phone] and follow the prompts.   For any non-urgent questions, you may also contact your provider using MyChart. We now offer e-Visits for anyone 55 and older to request care online for non-urgent symptoms. For details visit [email or web address].   Also download the MyChart app! Go to the app store, search "MyChart", open the app, select [facility], and log in with your MyChart username and password.  Masks are optional in the cancer centers. If you would like for your care team to wear a mask while they are taking care of you, please let them know. For doctor visits, patients may have with them one support person who is at least 70 years old. At this time, visitors are not allowed in the infusion area.  Irinotecan injection What is this medication? IRINOTECAN (ir in oh TEE kan ) is a chemotherapy drug. It is used to treat colon and rectal cancer. This medicine may be used for other purposes; ask your health care provider or pharmacist if you have  questions. COMMON BRAND NAME(S): Camptosar What should I tell my care team before I take this medication? They need to know if you have any of these conditions: dehydration diarrhea infection (especially a virus infection such as chickenpox, cold sores, or herpes) liver disease low blood counts, like low white cell, platelet, or red cell counts low  levels of calcium, magnesium, or potassium in the blood recent or ongoing radiation therapy an unusual or allergic reaction to irinotecan, other medicines, foods, dyes, or preservatives pregnant or trying to get pregnant breast-feeding How should I use this medication? This drug is given as an infusion into a vein. It is administered in a hospital or clinic by a specially trained health care professional. Talk to your pediatrician regarding the use of this medicine in children. Special care may be needed. Overdosage: If you think you have taken too much of this medicine contact a poison control center or emergency room at once. NOTE: This medicine is only for you. Do not share this medicine with others. What if I miss a dose? It is important not to miss your dose. Call your doctor or health care professional if you are unable to keep an appointment. What may interact with this medication? Do not take this medicine with any of the following medications: cobicistat itraconazole This medicine may interact with the following medications: antiviral medicines for HIV or AIDS certain antibiotics like rifampin or rifabutin certain medicines for fungal infections like ketoconazole, posaconazole, and voriconazole certain medicines for seizures like carbamazepine, phenobarbital, phenotoin clarithromycin gemfibrozil nefazodone St. Caidyn's Wort This list may not describe all possible interactions. Give your health care provider a list of all the medicines, herbs, non-prescription drugs, or dietary supplements you use. Also tell them if you smoke, drink alcohol, or use illegal drugs. Some items may interact with your medicine. What should I watch for while using this medication? Your condition will be monitored carefully while you are receiving this medicine. You will need important blood work done while you are taking this medicine. This drug may make you feel generally unwell. This is not uncommon, as  chemotherapy can affect healthy cells as well as cancer cells. Report any side effects. Continue your course of treatment even though you feel ill unless your doctor tells you to stop. In some cases, you may be given additional medicines to help with side effects. Follow all directions for their use. You may get drowsy or dizzy. Do not drive, use machinery, or do anything that needs mental alertness until you know how this medicine affects you. Do not stand or sit up quickly, especially if you are an older patient. This reduces the risk of dizzy or fainting spells. Call your health care professional for advice if you get a fever, chills, or sore throat, or other symptoms of a cold or flu. Do not treat yourself. This medicine decreases your body's ability to fight infections. Try to avoid being around people who are sick. Avoid taking products that contain aspirin, acetaminophen, ibuprofen, naproxen, or ketoprofen unless instructed by your doctor. These medicines may hide a fever. This medicine may increase your risk to bruise or bleed. Call your doctor or health care professional if you notice any unusual bleeding. Be careful brushing and flossing your teeth or using a toothpick because you may get an infection or bleed more easily. If you have any dental work done, tell your dentist you are receiving this medicine. Do not become pregnant while taking this medicine or for 6  months after stopping it. Women should inform their health care professional if they wish to become pregnant or think they might be pregnant. Men should not father a child while taking this medicine and for 3 months after stopping it. There is potential for serious side effects to an unborn child. Talk to your health care professional for more information. Do not breast-feed an infant while taking this medicine or for 7 days after stopping it. This medicine has caused ovarian failure in some women. This medicine may make it more  difficult to get pregnant. Talk to your health care professional if you are concerned about your fertility. This medicine has caused decreased sperm counts in some men. This may make it more difficult to father a child. Talk to your health care professional if you are concerned about your fertility. What side effects may I notice from receiving this medication? Side effects that you should report to your doctor or health care professional as soon as possible: allergic reactions like skin rash, itching or hives, swelling of the face, lips, or tongue chest pain diarrhea flushing, runny nose, sweating during infusion low blood counts - this medicine may decrease the number of white blood cells, red blood cells and platelets. You may be at increased risk for infections and bleeding. nausea, vomiting pain, swelling, warmth in the leg signs of decreased platelets or bleeding - bruising, pinpoint red spots on the skin, black, tarry stools, blood in the urine signs of infection - fever or chills, cough, sore throat, pain or difficulty passing urine signs of decreased red blood cells - unusually weak or tired, fainting spells, lightheadedness Side effects that usually do not require medical attention (report to your doctor or health care professional if they continue or are bothersome): constipation hair loss headache loss of appetite mouth sores stomach pain This list may not describe all possible side effects. Call your doctor for medical advice about side effects. You may report side effects to FDA at 1-800-FDA-1088. Where should I keep my medication? This drug is given in a hospital or clinic and will not be stored at home. NOTE: This sheet is a summary. It may not cover all possible information. If you have questions about this medicine, talk to your doctor, pharmacist, or health care provider.  2023 Elsevier/Gold Standard (2021-07-20 00:00:00) Leucovorin injection What is this  medication? LEUCOVORIN (loo koe VOR in) is used to prevent or treat the harmful effects of some medicines. This medicine is used to treat anemia caused by a low amount of folic acid in the body. It is also used with 5-fluorouracil (5-FU) to treat colon cancer. This medicine may be used for other purposes; ask your health care provider or pharmacist if you have questions. What should I tell my care team before I take this medication? They need to know if you have any of these conditions: anemia from low levels of vitamin B-12 in the blood an unusual or allergic reaction to leucovorin, folic acid, other medicines, foods, dyes, or preservatives pregnant or trying to get pregnant breast-feeding How should I use this medication? This medicine is for injection into a muscle or into a vein. It is given by a health care professional in a hospital or clinic setting. Talk to your pediatrician regarding the use of this medicine in children. Special care may be needed. Overdosage: If you think you have taken too much of this medicine contact a poison control center or emergency room at once. NOTE: This medicine is  only for you. Do not share this medicine with others. What if I miss a dose? This does not apply. What may interact with this medication? capecitabine fluorouracil phenobarbital phenytoin primidone trimethoprim-sulfamethoxazole This list may not describe all possible interactions. Give your health care provider a list of all the medicines, herbs, non-prescription drugs, or dietary supplements you use. Also tell them if you smoke, drink alcohol, or use illegal drugs. Some items may interact with your medicine. What should I watch for while using this medication? Your condition will be monitored carefully while you are receiving this medicine. This medicine may increase the side effects of 5-fluorouracil, 5-FU. Tell your doctor or health care professional if you have diarrhea or mouth sores  that do not get better or that get worse. What side effects may I notice from receiving this medication? Side effects that you should report to your doctor or health care professional as soon as possible: allergic reactions like skin rash, itching or hives, swelling of the face, lips, or tongue breathing problems fever, infection mouth sores unusual bleeding or bruising unusually weak or tired Side effects that usually do not require medical attention (report to your doctor or health care professional if they continue or are bothersome): constipation or diarrhea loss of appetite nausea, vomiting This list may not describe all possible side effects. Call your doctor for medical advice about side effects. You may report side effects to FDA at 1-800-FDA-1088. Where should I keep my medication? This drug is given in a hospital or clinic and will not be stored at home. NOTE: This sheet is a summary. It may not cover all possible information. If you have questions about this medicine, talk to your doctor, pharmacist, or health care provider.  2023 Elsevier/Gold Standard (2008-02-25 00:00:00) Fluorouracil, 5-FU injection What is this medication? FLUOROURACIL, 5-FU (flure oh YOOR a sil) is a chemotherapy drug. It slows the growth of cancer cells. This medicine is used to treat many types of cancer like breast cancer, colon or rectal cancer, pancreatic cancer, and stomach cancer. This medicine may be used for other purposes; ask your health care provider or pharmacist if you have questions. COMMON BRAND NAME(S): Adrucil What should I tell my care team before I take this medication? They need to know if you have any of these conditions: blood disorders dihydropyrimidine dehydrogenase (DPD) deficiency infection (especially a virus infection such as chickenpox, cold sores, or herpes) kidney disease liver disease malnourished, poor nutrition recent or ongoing radiation therapy an unusual or  allergic reaction to fluorouracil, other chemotherapy, other medicines, foods, dyes, or preservatives pregnant or trying to get pregnant breast-feeding How should I use this medication? This drug is given as an infusion or injection into a vein. It is administered in a hospital or clinic by a specially trained health care professional. Talk to your pediatrician regarding the use of this medicine in children. Special care may be needed. Overdosage: If you think you have taken too much of this medicine contact a poison control center or emergency room at once. NOTE: This medicine is only for you. Do not share this medicine with others. What if I miss a dose? It is important not to miss your dose. Call your doctor or health care professional if you are unable to keep an appointment. What may interact with this medication? Do not take this medicine with any of the following medications: live virus vaccines This medicine may also interact with the following medications: medicines that treat or prevent blood  clots like warfarin, enoxaparin, and dalteparin This list may not describe all possible interactions. Give your health care provider a list of all the medicines, herbs, non-prescription drugs, or dietary supplements you use. Also tell them if you smoke, drink alcohol, or use illegal drugs. Some items may interact with your medicine. What should I watch for while using this medication? Visit your doctor for checks on your progress. This drug may make you feel generally unwell. This is not uncommon, as chemotherapy can affect healthy cells as well as cancer cells. Report any side effects. Continue your course of treatment even though you feel ill unless your doctor tells you to stop. In some cases, you may be given additional medicines to help with side effects. Follow all directions for their use. Call your doctor or health care professional for advice if you get a fever, chills or sore throat, or  other symptoms of a cold or flu. Do not treat yourself. This drug decreases your body's ability to fight infections. Try to avoid being around people who are sick. This medicine may increase your risk to bruise or bleed. Call your doctor or health care professional if you notice any unusual bleeding. Be careful brushing and flossing your teeth or using a toothpick because you may get an infection or bleed more easily. If you have any dental work done, tell your dentist you are receiving this medicine. Avoid taking products that contain aspirin, acetaminophen, ibuprofen, naproxen, or ketoprofen unless instructed by your doctor. These medicines may hide a fever. Do not become pregnant while taking this medicine. Women should inform their doctor if they wish to become pregnant or think they might be pregnant. There is a potential for serious side effects to an unborn child. Talk to your health care professional or pharmacist for more information. Do not breast-feed an infant while taking this medicine. Men should inform their doctor if they wish to father a child. This medicine may lower sperm counts. Do not treat diarrhea with over the counter products. Contact your doctor if you have diarrhea that lasts more than 2 days or if it is severe and watery. This medicine can make you more sensitive to the sun. Keep out of the sun. If you cannot avoid being in the sun, wear protective clothing and use sunscreen. Do not use sun lamps or tanning beds/booths. What side effects may I notice from receiving this medication? Side effects that you should report to your doctor or health care professional as soon as possible: allergic reactions like skin rash, itching or hives, swelling of the face, lips, or tongue low blood counts - this medicine may decrease the number of white blood cells, red blood cells and platelets. You may be at increased risk for infections and bleeding. signs of infection - fever or chills,  cough, sore throat, pain or difficulty passing urine signs of decreased platelets or bleeding - bruising, pinpoint red spots on the skin, black, tarry stools, blood in the urine signs of decreased red blood cells - unusually weak or tired, fainting spells, lightheadedness breathing problems changes in vision chest pain mouth sores nausea and vomiting pain, swelling, redness at site where injected pain, tingling, numbness in the hands or feet redness, swelling, or sores on hands or feet stomach pain unusual bleeding Side effects that usually do not require medical attention (report to your doctor or health care professional if they continue or are bothersome): changes in finger or toe nails diarrhea dry or itchy skin hair  loss headache loss of appetite sensitivity of eyes to the light stomach upset unusually teary eyes This list may not describe all possible side effects. Call your doctor for medical advice about side effects. You may report side effects to FDA at 1-800-FDA-1088. Where should I keep my medication? This drug is given in a hospital or clinic and will not be stored at home. NOTE: This sheet is a summary. It may not cover all possible information. If you have questions about this medicine, talk to your doctor, pharmacist, or health care provider.  2023 Elsevier/Gold Standard (2021-07-20 00:00:00) The chemotherapy medication bag should finish at 46 hours, 96 hours, or 7 days. For example, if your pump is scheduled for 46 hours and it was put on at 4:00 p.m., it should finish at 2:00 p.m. the day it is scheduled to come off regardless of your appointment time.     Estimated time to finish at 12:00 p.m. on Friday 02/15/2022.   If the display on your pump reads "Low Volume" and it is beeping, take the batteries out of the pump and come to the cancer center for it to be taken off.   If the pump alarms go off prior to the pump reading "Low Volume" then call [phone] and  someone can assist you.  If the plunger comes out and the chemotherapy medication is leaking out, please use your home chemo spill kit to clean up the spill. Do NOT use paper towels or other household products.  If you have problems or questions regarding your pump, please call either [phone] (24 hours a day) or the cancer center Monday-Friday 8:00 a.m.- 4:30 p.m. at the clinic number and we will assist you. If you are unable to get assistance, then go to the nearest Emergency Department and ask the staff to contact the IV team for assistance.

## 2022-02-13 NOTE — Progress Notes (Signed)
David Jarvis OFFICE PROGRESS NOTE   Diagnosis: Pancreas cancer  INTERVAL HISTORY:   David Jarvis completed a cycle of FOLFIRI 01/31/2022.  He had 1 episode of diarrhea following chemotherapy.  He continues to have tingling in the extremities.  No nausea/vomiting.  Objective:  Vital signs in last 24 hours:  Blood pressure 138/81, pulse 83, temperature 98.2 F (36.8 C), temperature source Oral, resp. rate 18, height '5\' 6"'$  (1.676 m), weight 144 lb 3.2 oz (65.4 kg), SpO2 98 %.    HEENT: No thrush or ulcers Resp: Lungs clear bilaterally Cardio: Regular rate and rhythm GI: Nontender, no hepatosplenomegaly, no mass Vascular: No leg edema    Portacath/PICC-without erythema  Lab Results:  Lab Results  Component Value Date   WBC 8.9 02/13/2022   HGB 10.7 (L) 02/13/2022   HCT 34.0 (L) 02/13/2022   MCV 90.4 02/13/2022   PLT 157 02/13/2022   NEUTROABS 6.3 02/13/2022    CMP  Lab Results  Component Value Date   NA 141 02/13/2022   K 3.9 02/13/2022   CL 106 02/13/2022   CO2 24 02/13/2022   GLUCOSE 105 (H) 02/13/2022   BUN 13 02/13/2022   CREATININE 1.02 02/13/2022   CALCIUM 9.3 02/13/2022   PROT 6.3 (L) 02/13/2022   ALBUMIN 4.1 02/13/2022   AST 48 (H) 02/13/2022   ALT 78 (H) 02/13/2022   ALKPHOS 310 (H) 02/13/2022   BILITOT 0.5 02/13/2022   GFRNONAA >60 02/13/2022    Lab Results  Component Value Date   OZH086 28 10/01/2021    Medications: I have reviewed the patient's current medications.   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, stent placed EUS 05/23/2021-25 x 15 mm pancreas head mass with irregular borders, no lymphadenopathy, no vascular involvement, T2 N0 by ultrasound CT chest 06/01/2021-negative for metastatic disease CT  abdomen pancreas protocol 06/01/2021-3.2 x 2.3 cm hypoenhancing pancreas head mass inseparable from the third portion of the duodenum, 1 cm left retroperitoneal lymph node, no vascular involvement Cycle 1 FOLFIRINOX 06/19/2021 Cycle 2 FOLFIRINOX 07/02/2021, irinotecan and 5-FU dose reduced, prophylactic dexamethasone added beginning day of pump discontinuation Cycle 3 FOLFIRINOX 07/23/2021, Udenyca CT abdomen/pelvis 08/01/2021-done in the emergency department to evaluate epigastric pain-pancreas head mass decreased in size.  Diffuse gaseous distention of large and small bowel.  No bowel obstruction or focal inflammation. 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, Udenyca Cycle 10 FOLFIRINOX 01/07/2022, 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  GERD 7.  Squamous cell carcinoma left forearm 2014 8.  Hyperlipidemia 9.  CT  chest 08/01/2021, done in the emergency department to evaluate pleuritic chest pain-no PE.  Advanced three-vessel coronary vascular calcification. 10.  Fever of unknown origin beginning 08/14/2021-blood and urine cultures negative, no source for infection identified, placed on antibiotic prophylaxis beginning 08/17/2021, recurrent fever 10/09/2021-cultures negative, completed a course of Levaquin 11.  Fever/chills on 12/02/2021-urinalysis with clumps of white cells, culture positive for Staphylococcus epidermis and Enterococcus, completed a course of Augmentin      Disposition: David Jarvis appears stable.  He completed a final cycle of adjuvant chemotherapy today.  The tumor was noted to abut the uncinate margin on the resection specimen 11/15/2021.  I will contact Dr. Mariah Milling regarding the indication for adjuvant radiation.  David Jarvis will return for an office and lab visit in approximately 3 weeks.  He will be referred for Port-A-Cath removal after the next office visit.  Betsy Coder, MD  02/13/2022  10:03 AM

## 2022-02-15 ENCOUNTER — Inpatient Hospital Stay: Payer: Medicare HMO

## 2022-02-15 VITALS — BP 148/64 | HR 66 | Temp 98.2°F | Resp 18

## 2022-02-15 DIAGNOSIS — R7989 Other specified abnormal findings of blood chemistry: Secondary | ICD-10-CM | POA: Diagnosis not present

## 2022-02-15 DIAGNOSIS — G629 Polyneuropathy, unspecified: Secondary | ICD-10-CM | POA: Diagnosis not present

## 2022-02-15 DIAGNOSIS — I10 Essential (primary) hypertension: Secondary | ICD-10-CM | POA: Diagnosis not present

## 2022-02-15 DIAGNOSIS — C250 Malignant neoplasm of head of pancreas: Secondary | ICD-10-CM

## 2022-02-15 DIAGNOSIS — Z85820 Personal history of malignant melanoma of skin: Secondary | ICD-10-CM | POA: Diagnosis not present

## 2022-02-15 DIAGNOSIS — Z5189 Encounter for other specified aftercare: Secondary | ICD-10-CM | POA: Diagnosis not present

## 2022-02-15 DIAGNOSIS — K831 Obstruction of bile duct: Secondary | ICD-10-CM | POA: Diagnosis not present

## 2022-02-15 DIAGNOSIS — E785 Hyperlipidemia, unspecified: Secondary | ICD-10-CM | POA: Diagnosis not present

## 2022-02-15 DIAGNOSIS — Z5111 Encounter for antineoplastic chemotherapy: Secondary | ICD-10-CM | POA: Diagnosis not present

## 2022-02-15 DIAGNOSIS — K219 Gastro-esophageal reflux disease without esophagitis: Secondary | ICD-10-CM | POA: Diagnosis not present

## 2022-02-15 DIAGNOSIS — C772 Secondary and unspecified malignant neoplasm of intra-abdominal lymph nodes: Secondary | ICD-10-CM | POA: Diagnosis not present

## 2022-02-15 MED ORDER — HEPARIN SOD (PORK) LOCK FLUSH 100 UNIT/ML IV SOLN
500.0000 [IU] | Freq: Once | INTRAVENOUS | Status: AC | PRN
  Administered 2022-02-15: 500 [IU]

## 2022-02-15 MED ORDER — SODIUM CHLORIDE 0.9% FLUSH
10.0000 mL | INTRAVENOUS | Status: DC | PRN
  Administered 2022-02-15: 10 mL

## 2022-02-19 ENCOUNTER — Other Ambulatory Visit: Payer: Self-pay | Admitting: Oncology

## 2022-02-19 NOTE — Progress Notes (Signed)
I discussed the pathology report from the Whipple procedure with Dr. Mariah Milling on 02/19/2022.  He does not recommend adjuvant radiation.

## 2022-03-06 DIAGNOSIS — K219 Gastro-esophageal reflux disease without esophagitis: Secondary | ICD-10-CM | POA: Diagnosis not present

## 2022-03-06 DIAGNOSIS — Z8249 Family history of ischemic heart disease and other diseases of the circulatory system: Secondary | ICD-10-CM | POA: Diagnosis not present

## 2022-03-06 DIAGNOSIS — N529 Male erectile dysfunction, unspecified: Secondary | ICD-10-CM | POA: Diagnosis not present

## 2022-03-06 DIAGNOSIS — Z008 Encounter for other general examination: Secondary | ICD-10-CM | POA: Diagnosis not present

## 2022-03-06 DIAGNOSIS — N400 Enlarged prostate without lower urinary tract symptoms: Secondary | ICD-10-CM | POA: Diagnosis not present

## 2022-03-06 DIAGNOSIS — Z8507 Personal history of malignant neoplasm of pancreas: Secondary | ICD-10-CM | POA: Diagnosis not present

## 2022-03-06 DIAGNOSIS — Z7982 Long term (current) use of aspirin: Secondary | ICD-10-CM | POA: Diagnosis not present

## 2022-03-06 DIAGNOSIS — Z8546 Personal history of malignant neoplasm of prostate: Secondary | ICD-10-CM | POA: Diagnosis not present

## 2022-03-06 DIAGNOSIS — Z823 Family history of stroke: Secondary | ICD-10-CM | POA: Diagnosis not present

## 2022-03-06 DIAGNOSIS — E039 Hypothyroidism, unspecified: Secondary | ICD-10-CM | POA: Diagnosis not present

## 2022-03-07 ENCOUNTER — Encounter: Payer: Self-pay | Admitting: Nurse Practitioner

## 2022-03-07 ENCOUNTER — Inpatient Hospital Stay: Payer: Medicare HMO

## 2022-03-07 ENCOUNTER — Inpatient Hospital Stay: Payer: Medicare HMO | Attending: Oncology

## 2022-03-07 ENCOUNTER — Encounter: Payer: Self-pay | Admitting: *Deleted

## 2022-03-07 ENCOUNTER — Inpatient Hospital Stay (HOSPITAL_BASED_OUTPATIENT_CLINIC_OR_DEPARTMENT_OTHER): Payer: Medicare HMO | Admitting: Nurse Practitioner

## 2022-03-07 VITALS — BP 103/76 | HR 73 | Temp 98.1°F | Resp 18 | Ht 66.0 in | Wt 140.2 lb

## 2022-03-07 DIAGNOSIS — K219 Gastro-esophageal reflux disease without esophagitis: Secondary | ICD-10-CM | POA: Diagnosis not present

## 2022-03-07 DIAGNOSIS — Z9221 Personal history of antineoplastic chemotherapy: Secondary | ICD-10-CM | POA: Insufficient documentation

## 2022-03-07 DIAGNOSIS — C250 Malignant neoplasm of head of pancreas: Secondary | ICD-10-CM

## 2022-03-07 DIAGNOSIS — Z85820 Personal history of malignant melanoma of skin: Secondary | ICD-10-CM | POA: Insufficient documentation

## 2022-03-07 DIAGNOSIS — C772 Secondary and unspecified malignant neoplasm of intra-abdominal lymph nodes: Secondary | ICD-10-CM | POA: Insufficient documentation

## 2022-03-07 DIAGNOSIS — E785 Hyperlipidemia, unspecified: Secondary | ICD-10-CM | POA: Diagnosis not present

## 2022-03-07 DIAGNOSIS — Z95828 Presence of other vascular implants and grafts: Secondary | ICD-10-CM

## 2022-03-07 DIAGNOSIS — G629 Polyneuropathy, unspecified: Secondary | ICD-10-CM | POA: Insufficient documentation

## 2022-03-07 DIAGNOSIS — K831 Obstruction of bile duct: Secondary | ICD-10-CM | POA: Diagnosis not present

## 2022-03-07 DIAGNOSIS — I10 Essential (primary) hypertension: Secondary | ICD-10-CM | POA: Diagnosis not present

## 2022-03-07 LAB — CMP (CANCER CENTER ONLY)
ALT: 57 U/L — ABNORMAL HIGH (ref 0–44)
AST: 31 U/L (ref 15–41)
Albumin: 4.3 g/dL (ref 3.5–5.0)
Alkaline Phosphatase: 269 U/L — ABNORMAL HIGH (ref 38–126)
Anion gap: 11 (ref 5–15)
BUN: 13 mg/dL (ref 8–23)
CO2: 25 mmol/L (ref 22–32)
Calcium: 9.4 mg/dL (ref 8.9–10.3)
Chloride: 102 mmol/L (ref 98–111)
Creatinine: 1 mg/dL (ref 0.61–1.24)
GFR, Estimated: >60 mL/min (ref >60)
Glucose, Bld: 121 mg/dL — ABNORMAL HIGH (ref 70–99)
Potassium: 3.6 mmol/L (ref 3.5–5.1)
Sodium: 138 mmol/L (ref 135–145)
Total Bilirubin: 0.9 mg/dL (ref 0.3–1.2)
Total Protein: 6.8 g/dL (ref 6.5–8.1)

## 2022-03-07 LAB — CBC WITH DIFFERENTIAL (CANCER CENTER ONLY)
Abs Immature Granulocytes: 0.03 10*3/uL (ref 0.00–0.07)
Basophils Absolute: 0 10*3/uL (ref 0.0–0.1)
Basophils Relative: 1 %
Eosinophils Absolute: 0 10*3/uL (ref 0.0–0.5)
Eosinophils Relative: 0 %
HCT: 38.1 % — ABNORMAL LOW (ref 39.0–52.0)
Hemoglobin: 12.2 g/dL — ABNORMAL LOW (ref 13.0–17.0)
Immature Granulocytes: 0 %
Lymphocytes Relative: 12 %
Lymphs Abs: 1 10*3/uL (ref 0.7–4.0)
MCH: 28.5 pg (ref 26.0–34.0)
MCHC: 32 g/dL (ref 30.0–36.0)
MCV: 89 fL (ref 80.0–100.0)
Monocytes Absolute: 1 10*3/uL (ref 0.1–1.0)
Monocytes Relative: 11 %
Neutro Abs: 6.5 10*3/uL (ref 1.7–7.7)
Neutrophils Relative %: 76 %
Platelet Count: 167 10*3/uL (ref 150–400)
RBC: 4.28 MIL/uL (ref 4.22–5.81)
RDW: 16 % — ABNORMAL HIGH (ref 11.5–15.5)
WBC Count: 8.5 10*3/uL (ref 4.0–10.5)
nRBC: 0 % (ref 0.0–0.2)

## 2022-03-07 LAB — MAGNESIUM: Magnesium: 1.8 mg/dL (ref 1.7–2.4)

## 2022-03-07 MED ORDER — HEPARIN SOD (PORK) LOCK FLUSH 100 UNIT/ML IV SOLN
500.0000 [IU] | Freq: Once | INTRAVENOUS | Status: AC
  Administered 2022-03-07: 500 [IU] via INTRAVENOUS

## 2022-03-07 MED ORDER — SODIUM CHLORIDE 0.9% FLUSH
10.0000 mL | Freq: Once | INTRAVENOUS | Status: AC
  Administered 2022-03-07: 10 mL via INTRAVENOUS

## 2022-03-07 NOTE — Patient Instructions (Signed)

## 2022-03-07 NOTE — Progress Notes (Signed)
Left VM for [facility] IR requesting port removal.

## 2022-03-07 NOTE — Progress Notes (Signed)
[place] OFFICE PROGRESS NOTE   Diagnosis: Pancreas cancer  INTERVAL HISTORY:   David Jarvis returns as scheduled.  He completed the final cycle of chemotherapy 02/13/2022.  He denies nausea/vomiting.  No diarrhea.  Good appetite.  He thinks neuropathy symptoms are some better.  Yesterday at 7 PM he noted abdominal pain, better this morning.  He wonders if this was indigestion or gas.  He would like to have the Port-A-Cath removed.  Objective:  Vital signs in last 24 hours:  Blood pressure 103/76, pulse 73, temperature 98.1 F (36.7 C), temperature source Oral, resp. rate 18, height '5\' 6"'$  (1.676 m), weight 140 lb 3.2 oz (63.6 kg), SpO2 100 %.    HEENT: No thrush or ulcers. Lymphatics: No palpable cervical, supraclavicular or axillary lymph nodes. Resp: Lungs clear bilaterally. Cardio: Regular rate and rhythm. GI: Abdomen soft and nontender.  No hepatomegaly.  No mass. Vascular: No leg edema. Skin: Palms without erythema. Port-A-Cath without erythema.   Lab Results:  Lab Results  Component Value Date   WBC 8.5 03/07/2022   HGB 12.2 (L) 03/07/2022   HCT 38.1 (L) 03/07/2022   MCV 89.0 03/07/2022   PLT 167 03/07/2022   NEUTROABS 6.5 03/07/2022    Imaging:  No results found.  Medications: I have reviewed the patient's current medications.  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, stent placed EUS 05/23/2021-25 x 15 mm pancreas head mass with irregular borders, no lymphadenopathy, no vascular involvement, T2 N0 by ultrasound CT chest 06/01/2021-negative for metastatic disease CT abdomen pancreas protocol 06/01/2021-3.2 x 2.3 cm hypoenhancing pancreas head mass inseparable from the third portion of the duodenum, 1 cm left retroperitoneal  lymph node, no vascular involvement Cycle 1 FOLFIRINOX 06/19/2021 Cycle 2 FOLFIRINOX 07/02/2021, irinotecan and 5-FU dose reduced, prophylactic dexamethasone added beginning day of pump discontinuation Cycle 3 FOLFIRINOX 07/23/2021, Udenyca CT abdomen/pelvis 08/01/2021-done in the emergency department to evaluate epigastric pain-pancreas head mass decreased in size.  Diffuse gaseous distention of large and small bowel.  No bowel obstruction or focal inflammation. 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 Benay Spice reviewed pathology report from the Whipple procedure with Dr. Mariah Milling, adjuvant radiation not recommended. Obstructive jaundice secondary #1 Hypertension Admission in February 2022 with elevated liver enzymes MRI/MRCP-intra and extrahepatic biliary duct dilation, tapering smoothly to the ampulla, no mass or filling defect ERCP 10/06/2020-dilated main bile duct, biliary tree was swept and nothing was found, cytology from bile duct brushing-no malignant cells identified 5.  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  GERD 7.  Squamous cell carcinoma left forearm 2014 8.  Hyperlipidemia 9.  CT chest 08/01/2021, done in the  emergency department to evaluate pleuritic chest pain-no PE.  Advanced three-vessel coronary vascular calcification. 10.  Fever of unknown origin beginning 08/14/2021-blood and urine cultures negative, no source for infection identified, placed on antibiotic prophylaxis beginning 08/17/2021, recurrent fever 10/09/2021-cultures negative, completed a course of Levaquin 11.  Fever/chills on 12/02/2021-urinalysis with clumps of white cells, culture positive for Staphylococcus epidermis and Enterococcus, completed a course of Augmentin      Disposition: David Jarvis appears stable.  He has completed the course of adjuvant chemotherapy.  He is scheduled for CT scans and follow-up with Dr. Mariah Milling at [facility] 03/26/2022.  He will return for follow-up here in 3 months.  Referral made to interventional radiology for Port-A-Cath removal.    Ned Card ANP/GNP-BC   03/07/2022  11:10 AM

## 2022-03-08 ENCOUNTER — Other Ambulatory Visit: Payer: Self-pay | Admitting: Nurse Practitioner

## 2022-03-08 DIAGNOSIS — C250 Malignant neoplasm of head of pancreas: Secondary | ICD-10-CM

## 2022-03-08 LAB — CANCER ANTIGEN 19-9: CA 19-9: 8 U/mL (ref 0–35)

## 2022-03-10 DIAGNOSIS — Z90411 Acquired partial absence of pancreas: Secondary | ICD-10-CM | POA: Diagnosis not present

## 2022-03-10 DIAGNOSIS — D72828 Other elevated white blood cell count: Secondary | ICD-10-CM | POA: Diagnosis not present

## 2022-03-10 DIAGNOSIS — Z8507 Personal history of malignant neoplasm of pancreas: Secondary | ICD-10-CM | POA: Diagnosis not present

## 2022-03-10 DIAGNOSIS — D72829 Elevated white blood cell count, unspecified: Secondary | ICD-10-CM | POA: Diagnosis not present

## 2022-03-10 DIAGNOSIS — R509 Fever, unspecified: Secondary | ICD-10-CM | POA: Diagnosis not present

## 2022-03-10 DIAGNOSIS — R1084 Generalized abdominal pain: Secondary | ICD-10-CM | POA: Diagnosis not present

## 2022-03-10 DIAGNOSIS — Z9889 Other specified postprocedural states: Secondary | ICD-10-CM | POA: Diagnosis not present

## 2022-03-10 DIAGNOSIS — Z79899 Other long term (current) drug therapy: Secondary | ICD-10-CM | POA: Diagnosis not present

## 2022-03-10 DIAGNOSIS — Z20822 Contact with and (suspected) exposure to covid-19: Secondary | ICD-10-CM | POA: Diagnosis not present

## 2022-03-10 DIAGNOSIS — R1030 Lower abdominal pain, unspecified: Secondary | ICD-10-CM | POA: Diagnosis not present

## 2022-03-12 ENCOUNTER — Other Ambulatory Visit (HOSPITAL_COMMUNITY): Payer: Medicare HMO

## 2022-03-25 ENCOUNTER — Other Ambulatory Visit: Payer: Self-pay

## 2022-04-03 ENCOUNTER — Ambulatory Visit (HOSPITAL_COMMUNITY)
Admission: RE | Admit: 2022-04-03 | Discharge: 2022-04-03 | Disposition: A | Discharge status: Home / Self Care | Payer: Medicare HMO | Source: Ambulatory Visit | Attending: Nurse Practitioner | Admitting: Nurse Practitioner

## 2022-04-03 DIAGNOSIS — C250 Malignant neoplasm of head of pancreas: Secondary | ICD-10-CM

## 2022-04-03 DIAGNOSIS — Z452 Encounter for adjustment and management of vascular access device: Secondary | ICD-10-CM | POA: Insufficient documentation

## 2022-04-03 HISTORY — PX: IR REMOVAL TUN ACCESS W/ PORT W/O FL MOD SED: IMG2290

## 2022-04-03 MED ORDER — LIDOCAINE HCL 1 % IJ SOLN
INTRAMUSCULAR | Status: AC
  Filled 2022-04-03: qty 20

## 2022-04-03 NOTE — Procedures (Signed)
Interventional Radiology Procedure Note  Procedure: Removal of a right IJ approach single lumen PowerPort.    Complications: None Recommendations:  - Ok to shower tomorrow - Do not submerge for 7 days - Routine wound care   Signed,  Garon Melander S. Deforest Maiden, DO   

## 2022-05-12 ENCOUNTER — Other Ambulatory Visit: Payer: Self-pay

## 2022-05-12 ENCOUNTER — Encounter (HOSPITAL_BASED_OUTPATIENT_CLINIC_OR_DEPARTMENT_OTHER): Payer: Self-pay | Admitting: Emergency Medicine

## 2022-05-12 DIAGNOSIS — Z8507 Personal history of malignant neoplasm of pancreas: Secondary | ICD-10-CM | POA: Insufficient documentation

## 2022-05-12 DIAGNOSIS — Z7982 Long term (current) use of aspirin: Secondary | ICD-10-CM | POA: Diagnosis not present

## 2022-05-12 DIAGNOSIS — R509 Fever, unspecified: Secondary | ICD-10-CM | POA: Diagnosis not present

## 2022-05-12 DIAGNOSIS — Z20822 Contact with and (suspected) exposure to covid-19: Secondary | ICD-10-CM | POA: Diagnosis not present

## 2022-05-12 DIAGNOSIS — E876 Hypokalemia: Secondary | ICD-10-CM | POA: Insufficient documentation

## 2022-05-12 NOTE — ED Triage Notes (Signed)
Pt via pov from home with fever and chills that started yesterday. Pt denies any n/v/d; no cough or congestion; endorses frequency of urination, but states he has been drinking a lot; denies pain upon urination.

## 2022-05-13 ENCOUNTER — Emergency Department (HOSPITAL_BASED_OUTPATIENT_CLINIC_OR_DEPARTMENT_OTHER): Payer: Medicare HMO

## 2022-05-13 ENCOUNTER — Emergency Department (HOSPITAL_BASED_OUTPATIENT_CLINIC_OR_DEPARTMENT_OTHER)
Admission: EM | Admit: 2022-05-13 | Discharge: 2022-05-13 | Disposition: A | Discharge status: Home / Self Care | Payer: Medicare HMO | Attending: Emergency Medicine | Admitting: Emergency Medicine

## 2022-05-13 DIAGNOSIS — R509 Fever, unspecified: Secondary | ICD-10-CM | POA: Diagnosis not present

## 2022-05-13 DIAGNOSIS — E876 Hypokalemia: Secondary | ICD-10-CM

## 2022-05-13 LAB — CBC WITH DIFFERENTIAL/PLATELET
Abs Immature Granulocytes: 0.04 10*3/uL (ref 0.00–0.07)
Basophils Absolute: 0 10*3/uL (ref 0.0–0.1)
Basophils Relative: 0 %
Eosinophils Absolute: 0 10*3/uL (ref 0.0–0.5)
Eosinophils Relative: 0 %
HCT: 40 % (ref 39.0–52.0)
Hemoglobin: 13.6 g/dL (ref 13.0–17.0)
Immature Granulocytes: 1 %
Lymphocytes Relative: 3 %
Lymphs Abs: 0.3 10*3/uL — ABNORMAL LOW (ref 0.7–4.0)
MCH: 28.5 pg (ref 26.0–34.0)
MCHC: 34 g/dL (ref 30.0–36.0)
MCV: 83.9 fL (ref 80.0–100.0)
Monocytes Absolute: 0.7 10*3/uL (ref 0.1–1.0)
Monocytes Relative: 9 %
Neutro Abs: 6.7 10*3/uL (ref 1.7–7.7)
Neutrophils Relative %: 87 %
Platelets: 138 10*3/uL — ABNORMAL LOW (ref 150–400)
RBC: 4.77 MIL/uL (ref 4.22–5.81)
RDW: 15.1 % (ref 11.5–15.5)
WBC: 7.7 10*3/uL (ref 4.0–10.5)
nRBC: 0 % (ref 0.0–0.2)

## 2022-05-13 LAB — COMPREHENSIVE METABOLIC PANEL
ALT: 52 U/L — ABNORMAL HIGH (ref 0–44)
AST: 33 U/L (ref 15–41)
Albumin: 3.9 g/dL (ref 3.5–5.0)
Alkaline Phosphatase: 293 U/L — ABNORMAL HIGH (ref 38–126)
Anion gap: 11 (ref 5–15)
BUN: 14 mg/dL (ref 8–23)
CO2: 20 mmol/L — ABNORMAL LOW (ref 22–32)
Calcium: 8.6 mg/dL — ABNORMAL LOW (ref 8.9–10.3)
Chloride: 105 mmol/L (ref 98–111)
Creatinine, Ser: 1.03 mg/dL (ref 0.61–1.24)
GFR, Estimated: >60 mL/min (ref >60)
Glucose, Bld: 276 mg/dL — ABNORMAL HIGH (ref 70–99)
Potassium: 2.8 mmol/L — ABNORMAL LOW (ref 3.5–5.1)
Sodium: 136 mmol/L (ref 135–145)
Total Bilirubin: 0.8 mg/dL (ref 0.3–1.2)
Total Protein: 6.5 g/dL (ref 6.5–8.1)

## 2022-05-13 LAB — URINALYSIS, ROUTINE W REFLEX MICROSCOPIC
Bilirubin Urine: NEGATIVE
Glucose, UA: 250 mg/dL — AB
Hgb urine dipstick: NEGATIVE
Ketones, ur: NEGATIVE mg/dL
Leukocytes,Ua: NEGATIVE
Nitrite: NEGATIVE
Protein, ur: 30 mg/dL — AB
Specific Gravity, Urine: 1.028 (ref 1.005–1.030)
pH: 5.5 (ref 5.0–8.0)

## 2022-05-13 LAB — RESP PANEL BY RT-PCR (FLU A&B, COVID) ARPGX2
Influenza A by PCR: NEGATIVE
Influenza B by PCR: NEGATIVE
SARS Coronavirus 2 by RT PCR: NEGATIVE

## 2022-05-13 LAB — LACTIC ACID, PLASMA
Lactic Acid, Venous: 1 mmol/L (ref 0.5–1.9)
Lactic Acid, Venous: 1.3 mmol/L (ref 0.5–1.9)

## 2022-05-13 MED ORDER — SODIUM CHLORIDE 0.9 % IV BOLUS
1000.0000 mL | Freq: Once | INTRAVENOUS | Status: AC
  Administered 2022-05-13: 1000 mL via INTRAVENOUS

## 2022-05-13 MED ORDER — POTASSIUM CHLORIDE 10 MEQ/100ML IV SOLN
10.0000 meq | INTRAVENOUS | Status: AC
  Administered 2022-05-13 (×2): 10 meq via INTRAVENOUS
  Filled 2022-05-13 (×2): qty 100

## 2022-05-13 MED ORDER — POTASSIUM CHLORIDE CRYS ER 20 MEQ PO TBCR: 40.0000 meq | EXTENDED_RELEASE_TABLET | Freq: Every day | ORAL | 0 refills | Status: DC

## 2022-05-13 MED ORDER — MAGNESIUM SULFATE 2 GM/50ML IV SOLN
2.0000 g | Freq: Once | INTRAVENOUS | Status: AC
  Administered 2022-05-13: 2 g via INTRAVENOUS
  Filled 2022-05-13: qty 50

## 2022-05-13 NOTE — Discharge Instructions (Addendum)
You were seen today for fever.  Your infectious work-up today is reassuring.  All your lab work appears reassuring and your COVID and influenza test are negative.  Take Tylenol or ibuprofen as needed for any fevers or body aches.  Your potassium was low.  This will be replaced.  Follow-up with your primary doctor in 1 week for potassium recheck.

## 2022-05-13 NOTE — ED Provider Notes (Signed)
[place] EMERGENCY DEPT Provider Note   CSN: [id number] Arrival date & time: 05/12/22  2300     History  Chief Complaint  Patient presents with   Fever    David Jarvis is a 71 y.o. male.  HPI     This is a 71 year old male with history of pancreatic cancer who presents with concerns for fever and chills.  Patient reports fevers up to 103 at home.  He states he had problems with fevers of unknown origin while on chemotherapy.  His last chemotherapy was in June 2023.  He is currently in remission.  He has not noted any cough or upper respiratory symptoms.  Denies nausea, vomiting, diarrhea.  Has had some urinary frequency but no dysuria.  No flank pain.  Home Medications Prior to Admission medications   Medication Sig Start Date End Date Taking? Authorizing Provider  potassium chloride SA (KLOR-CON M) 20 MEQ tablet Take 2 tablets (40 mEq total) by mouth daily. 05/13/22  Yes Aizley Stenseth, Barbette Hair, MD  aspirin EC 81 MG tablet Take 81 mg by mouth daily.    [provider]  atorvastatin (LIPITOR) 10 MG tablet Take 1 tablet by mouth daily Patient not taking: Reported on 08/06/2021 05/30/21     diphenoxylate-atropine (LOMOTIL) 2.5-0.025 MG tablet Take 1 tablet by mouth 4 (four) times daily as needed for diarrhea or loose stools. Patient not taking: Reported on 12/25/2021 06/27/21   Owens Shark, NP  levothyroxine (SYNTHROID) 25 MCG tablet Take 1 tablet by mouth daily before breakfast 06/06/21     lidocaine-prilocaine (EMLA) cream Apply 1 application topically as needed. Patient not taking: Reported on 03/07/2022 06/06/21   Ladell Pier, MD  LORazepam (ATIVAN) 0.5 MG tablet Place 1 tablet (0.5 mg total) under the tongue every 8 (eight) hours as needed (for nausea). Patient not taking: Reported on 10/01/2021 06/26/21   Owens Shark, NP  magnesium oxide (MAG-OX) 400 (240 Mg) MG tablet Take 1 tablet (400 mg total) by mouth daily. Patient not taking: Reported on  03/07/2022 12/06/21   Ladell Pier, MD  ondansetron (ZOFRAN) 8 MG tablet Take 1 tablet (8 mg total) by mouth 2 (two) times daily. Patient not taking: Reported on 12/06/2021 06/06/21   Ladell Pier, MD  pantoprazole (PROTONIX) 40 MG tablet TAKE ONE TABLET BY MOUTH EVERY DAY 90 days 07/31/21     prochlorperazine (COMPAZINE) 10 MG tablet Take 1 tablet (10 mg total) by mouth every 6 (six) hours as needed for nausea or vomiting. Patient not taking: Reported on 10/01/2021 06/06/21   Ladell Pier, MD  tamsulosin [facility]) 0.4 MG CAPS capsule Take 1 capsule by mouth once daily 03/08/21     valsartan-hydrochlorothiazide (DIOVAN-HCT) 80-12.5 MG tablet Take 1/2 tablet by mouth once daily *please half* Patient not taking: Reported on 10/01/2021 03/08/21     nitroGLYCERIN (NITROGLYN) 2 % OINT ointment Apply pea size amount to hemorrhoid twice daily for 14 days. 08/29/21 08/30/21        Allergies    Ace inhibitors, Lisinopril, and Lisinopril-hydrochlorothiazide    Review of Systems   Review of Systems  Constitutional:  Positive for fever.  Respiratory:  Negative for cough.   Genitourinary:  Positive for frequency.  All other systems reviewed and are negative.   Physical Exam Updated Vital Signs BP 109/68   Pulse (!) 52   Temp (!) 97.5 F (36.4 C)   Resp 11   Ht 1.676 m ('5\' 6"'$ )   Wt  64.9 kg   SpO2 95%   BMI 23.09 kg/m  Physical Exam Vitals and nursing note reviewed.  Constitutional:      Appearance: He is well-developed. He is not ill-appearing.  HENT:     Head: Normocephalic and atraumatic.     Nose: Nose normal.     Mouth/Throat:     Mouth: Mucous membranes are moist.  Eyes:     Pupils: Pupils are equal, round, and reactive to light.  Cardiovascular:     Rate and Rhythm: Normal rate and regular rhythm.     Heart sounds: Normal heart sounds. No murmur heard. Pulmonary:     Effort: Pulmonary effort is normal. No respiratory distress.     Breath sounds: Normal breath sounds. No  wheezing.  Abdominal:     General: Bowel sounds are normal.     Palpations: Abdomen is soft.     Tenderness: There is no abdominal tenderness. There is no rebound.  Musculoskeletal:     Cervical back: Neck supple.  Lymphadenopathy:     Cervical: No cervical adenopathy.  Skin:    General: Skin is warm and dry.  Neurological:     Mental Status: He is alert and oriented to person, place, and time.  Psychiatric:        Mood and Affect: Mood normal.     ED Results / Procedures / Treatments   Labs (all labs ordered are listed, but only abnormal results are displayed) Labs Reviewed  COMPREHENSIVE METABOLIC PANEL - Abnormal; Notable for the following components:      Result Value   Potassium 2.8 (*)    CO2 20 (*)    Glucose, Bld 276 (*)    Calcium 8.6 (*)    ALT 52 (*)    Alkaline Phosphatase 293 (*)    All other components within normal limits  CBC WITH DIFFERENTIAL/PLATELET - Abnormal; Notable for the following components:   Platelets 138 (*)    Lymphs Abs 0.3 (*)    All other components within normal limits  URINALYSIS, ROUTINE W REFLEX MICROSCOPIC - Abnormal; Notable for the following components:   Glucose, UA 250 (*)    Protein, ur 30 (*)    All other components within normal limits  RESP PANEL BY RT-PCR (FLU A&B, COVID) [id number]  LACTIC ACID, PLASMA  LACTIC ACID, PLASMA    EKG EKG Interpretation  Date/Time:  Monday May 13 2022 02:42:24 EDT Ventricular Rate:  57 PR Interval:  133 QRS Duration: 103 QT Interval:  452 QTC Calculation: 441 R Axis:   80 Text Interpretation: Sinus rhythm Confirmed by Thayer Jew [phone]) on 05/13/2022 4:10:15 AM  Radiology DG Chest Port 1 View  Result Date: 05/13/2022 CLINICAL DATA:  Fever and chills EXAM: PORTABLE CHEST 1 VIEW COMPARISON:  12/02/2021 FINDINGS: Interval removal of previously noted right chest port. Cardiac and mediastinal contours are within normal limits. No focal pulmonary opacity. No pleural effusion or  pneumothorax. No acute osseous abnormality. IMPRESSION: No acute cardiopulmonary process. Electronically Signed   By: Merilyn Baba M.D.   On: 05/13/2022 02:26    Procedures .Critical Care  Performed by: Merryl Hacker, MD Authorized by: Merryl Hacker, MD   Critical care provider statement:    Critical care time (minutes):  35   Critical care was necessary to treat or prevent imminent or life-threatening deterioration of the following conditions:  Metabolic crisis (Hypokalemia)   Critical care was time spent personally by me on the following activities:  Development of  treatment plan with patient or surrogate, discussions with consultants, evaluation of patient's response to treatment, examination of patient, ordering and review of laboratory studies, ordering and review of radiographic studies, ordering and performing treatments and interventions, pulse oximetry, re-evaluation of patient's condition and review of old charts     Medications Ordered in ED Medications  sodium chloride 0.9 % bolus 1,000 mL (1,000 mLs Intravenous New Bag/Given 05/13/22 0234)  potassium chloride 10 mEq in 100 mL IVPB (0 mEq Intravenous Stopped 05/13/22 0446)  magnesium sulfate IVPB 2 g 50 mL (0 g Intravenous Stopped 05/13/22 0252)    ED Course/ Medical Decision Making/ A&P                           Medical Decision Making Amount and/or Complexity of Data Reviewed Labs: ordered. Radiology: ordered.  Risk Prescription drug management.  This patient presents to the ED for concern of fever, this involves an extensive number of treatment options, and is a complaint that carries with it a high risk of complications and morbidity.  I considered the following differential and admission for this acute, potentially life threatening condition.  The differential diagnosis includes bacterial infection to include pneumonia or new tract infection, viral infection such as COVID or influenza, sepsis  MDM:    This  is a 71 year old male who presents with concerns for fevers at home.  He is overall nontoxic-appearing.  He is afebrile here.  Reports taking Tylenol at home but fever recurs.  He otherwise is asymptomatic.  He has a history of fever of unknown origin while undergoing chemotherapy for pancreatic cancer.  He has finished chemotherapy and is greater than 2 months out from his last chemotherapy.  He is not on any other immunosuppressants.  Labs obtained.  No leukocytosis.  Lactate is normal.  Blood cultures pending.  Chest x-ray without pneumonia.  Urinalysis not consistent with UTI.  COVID and influenza testing pending.  He does have evidence of hypokalemia.  This was replaced.  Will send home with a course of potassium was recommended for recheck in 1 week.  Overall work-up is reassuring.  I discussed the results with patient and his wife.  They are concerned regarding recurrent fever.  To the best of my knowledge, patient is immunocompetent at this point; however, given his history, would recommend that he follow-up with his oncologist given his concerns for recurrent fever.  Recommend Tylenol or ibuprofen for ongoing symptoms.  (Labs, imaging, consults)  Labs: I Ordered, and personally interpreted labs.  The pertinent results include: CBC, CMP, lactate, urinalysis, COVID and influenza testing  Imaging Studies ordered: I ordered imaging studies including chest x-ray I independently visualized and interpreted imaging. I agree with the radiologist interpretation  Additional history obtained from chart review.  External records from outside source obtained and reviewed including oncology notes  Cardiac Monitoring: The patient was maintained on a cardiac monitor.  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   right temple ([facility])   BPV (benign positional vertigo)    Colon  polyps    GERD (gastroesophageal reflux disease)    Glucose intolerance (impaired glucose tolerance)    History of nuclear stress test    ETT-Myoview 2/18: EF 57, no ischemia, low risk   Hyperlipidemia    Hypertension    Melanoma ([place]) 07/22/2008   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    Dispense:  10 tablet    Refill:  0    I have reviewed the patients home medicines and have made adjustments as needed  Problem List / ED Course: Problem List Items Addressed This Visit   None Visit Diagnoses     Fever, unspecified    -  Primary   Hypokalemia                       Final Clinical Impression(s) / ED Diagnoses Final diagnoses:  Fever, unspecified  Hypokalemia    Rx / DC Orders ED Discharge Orders          Ordered    potassium chloride SA (KLOR-CON M) 20 MEQ tablet  Daily        05/13/22 0517              Merryl Hacker, MD 05/13/22 [phone]

## 2022-05-20 DIAGNOSIS — E876 Hypokalemia: Secondary | ICD-10-CM | POA: Diagnosis not present

## 2022-05-29 DIAGNOSIS — Z8507 Personal history of malignant neoplasm of pancreas: Secondary | ICD-10-CM | POA: Diagnosis not present

## 2022-05-29 DIAGNOSIS — Z08 Encounter for follow-up examination after completed treatment for malignant neoplasm: Secondary | ICD-10-CM | POA: Diagnosis not present

## 2022-05-29 DIAGNOSIS — R140 Abdominal distension (gaseous): Secondary | ICD-10-CM | POA: Diagnosis not present

## 2022-05-29 DIAGNOSIS — C250 Malignant neoplasm of head of pancreas: Secondary | ICD-10-CM | POA: Diagnosis not present

## 2022-05-29 DIAGNOSIS — R7989 Other specified abnormal findings of blood chemistry: Secondary | ICD-10-CM | POA: Diagnosis not present

## 2022-06-03 DIAGNOSIS — N400 Enlarged prostate without lower urinary tract symptoms: Secondary | ICD-10-CM | POA: Diagnosis not present

## 2022-06-03 DIAGNOSIS — E039 Hypothyroidism, unspecified: Secondary | ICD-10-CM | POA: Diagnosis not present

## 2022-06-03 DIAGNOSIS — E785 Hyperlipidemia, unspecified: Secondary | ICD-10-CM | POA: Diagnosis not present

## 2022-06-03 DIAGNOSIS — R109 Unspecified abdominal pain: Secondary | ICD-10-CM | POA: Diagnosis not present

## 2022-06-03 DIAGNOSIS — E038 Other specified hypothyroidism: Secondary | ICD-10-CM | POA: Diagnosis not present

## 2022-06-03 DIAGNOSIS — Z9049 Acquired absence of other specified parts of digestive tract: Secondary | ICD-10-CM | POA: Diagnosis not present

## 2022-06-03 DIAGNOSIS — Z888 Allergy status to other drugs, medicaments and biological substances status: Secondary | ICD-10-CM | POA: Diagnosis not present

## 2022-06-03 DIAGNOSIS — Z90411 Acquired partial absence of pancreas: Secondary | ICD-10-CM | POA: Diagnosis not present

## 2022-06-03 DIAGNOSIS — K831 Obstruction of bile duct: Secondary | ICD-10-CM | POA: Diagnosis not present

## 2022-06-03 DIAGNOSIS — Z79890 Hormone replacement therapy (postmenopausal): Secondary | ICD-10-CM | POA: Diagnosis not present

## 2022-06-03 DIAGNOSIS — Z79899 Other long term (current) drug therapy: Secondary | ICD-10-CM | POA: Diagnosis not present

## 2022-06-03 DIAGNOSIS — R748 Abnormal levels of other serum enzymes: Secondary | ICD-10-CM | POA: Diagnosis not present

## 2022-06-03 DIAGNOSIS — K219 Gastro-esophageal reflux disease without esophagitis: Secondary | ICD-10-CM | POA: Diagnosis not present

## 2022-06-03 DIAGNOSIS — C250 Malignant neoplasm of head of pancreas: Secondary | ICD-10-CM | POA: Diagnosis not present

## 2022-06-07 ENCOUNTER — Inpatient Hospital Stay: Payer: Medicare HMO | Attending: Oncology

## 2022-06-07 ENCOUNTER — Inpatient Hospital Stay: Payer: Medicare HMO | Admitting: Oncology

## 2022-06-07 VITALS — BP 112/81 | HR 65 | Temp 98.2°F | Resp 18 | Ht 66.0 in | Wt 144.6 lb

## 2022-06-07 DIAGNOSIS — C250 Malignant neoplasm of head of pancreas: Secondary | ICD-10-CM | POA: Insufficient documentation

## 2022-06-07 DIAGNOSIS — I10 Essential (primary) hypertension: Secondary | ICD-10-CM | POA: Diagnosis not present

## 2022-06-07 DIAGNOSIS — C772 Secondary and unspecified malignant neoplasm of intra-abdominal lymph nodes: Secondary | ICD-10-CM | POA: Insufficient documentation

## 2022-06-07 DIAGNOSIS — R6881 Early satiety: Secondary | ICD-10-CM | POA: Insufficient documentation

## 2022-06-07 DIAGNOSIS — K219 Gastro-esophageal reflux disease without esophagitis: Secondary | ICD-10-CM | POA: Insufficient documentation

## 2022-06-07 DIAGNOSIS — E785 Hyperlipidemia, unspecified: Secondary | ICD-10-CM | POA: Insufficient documentation

## 2022-06-07 DIAGNOSIS — Z9221 Personal history of antineoplastic chemotherapy: Secondary | ICD-10-CM | POA: Diagnosis not present

## 2022-06-07 LAB — CMP (CANCER CENTER ONLY)
ALT: 67 U/L — ABNORMAL HIGH (ref 0–44)
AST: 63 U/L — ABNORMAL HIGH (ref 15–41)
Albumin: 4.2 g/dL (ref 3.5–5.0)
Alkaline Phosphatase: 369 U/L — ABNORMAL HIGH (ref 38–126)
Anion gap: 9 (ref 5–15)
BUN: 14 mg/dL (ref 8–23)
CO2: 27 mmol/L (ref 22–32)
Calcium: 9.1 mg/dL (ref 8.9–10.3)
Chloride: 99 mmol/L (ref 98–111)
Creatinine: 1.3 mg/dL — ABNORMAL HIGH (ref 0.61–1.24)
GFR, Estimated: 59 mL/min — ABNORMAL LOW (ref >60)
Glucose, Bld: 186 mg/dL — ABNORMAL HIGH (ref 70–99)
Potassium: 4.1 mmol/L (ref 3.5–5.1)
Sodium: 135 mmol/L (ref 135–145)
Total Bilirubin: 0.7 mg/dL (ref 0.3–1.2)
Total Protein: 6.9 g/dL (ref 6.5–8.1)

## 2022-06-07 NOTE — Progress Notes (Signed)
[facility] OFFICE PROGRESS NOTE   Diagnosis: Pancreas cancer  INTERVAL HISTORY:   David Jarvis returns as scheduled.  He generally feels well.  He has early satiety and reflux symptoms.  CTs have not revealed evidence of metastatic disease.  He reports no fever for the past 3 weeks. He has been followed in surgery and gastroenterology at [facility] for the early satiety and fever.  He underwent an ERCP 06/03/2022 there was a stenotic orifice to the biliary tree and a catheter cannot be placed.  He is discussing options with Dr. Mariah Milling next week and reports this includes bile duct dilation or surgery.  He was placed on ciprofloxacin and Flagyl 05/29/2022.  He has minimal remaining numbness in the fingers.  Objective:  Vital signs in last 24 hours:  Blood pressure 112/81, pulse 65, temperature 98.2 F (36.8 C), temperature source Oral, resp. rate 18, height '5\' 6"'$  (1.676 m), weight 144 lb 9.6 oz (65.6 kg), SpO2 100 %.   Lymphatics: No cervical, supraclavicular, axillary, or inguinal nodes Resp: Lungs clear bilaterally Cardio: Regular rate and rhythm GI: Nontender, no hepatosplenomegaly, no mass Vascular: No leg edema  Lab Results:  Lab Results  Component Value Date   WBC 7.7 05/12/2022   HGB 13.6 05/12/2022   HCT 40.0 05/12/2022   MCV 83.9 05/12/2022   PLT 138 (L) 05/12/2022   NEUTROABS 6.7 05/12/2022    CMP  Lab Results  Component Value Date   NA 135 06/07/2022   K 4.1 06/07/2022   CL 99 06/07/2022   CO2 27 06/07/2022   GLUCOSE 186 (H) 06/07/2022   BUN 14 06/07/2022   CREATININE 1.30 (H) 06/07/2022   CALCIUM 9.1 06/07/2022   PROT 6.9 06/07/2022   ALBUMIN 4.2 06/07/2022   AST 63 (H) 06/07/2022   ALT 67 (H) 06/07/2022   ALKPHOS 369 (H) 06/07/2022   BILITOT 0.7 06/07/2022   GFRNONAA 59 (L) 06/07/2022    Lab Results  Component Value Date   OHY073 8 03/07/2022    Medications: I have reviewed the patient's current  medications.   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, stent placed EUS 05/23/2021-25 x 15 mm pancreas head mass with irregular borders, no lymphadenopathy, no vascular involvement, T2 N0 by ultrasound CT chest 06/01/2021-negative for metastatic disease CT abdomen pancreas protocol 06/01/2021-3.2 x 2.3 cm hypoenhancing pancreas head mass inseparable from the third portion of the duodenum, 1 cm left retroperitoneal lymph node, no vascular involvement Cycle 1 FOLFIRINOX 06/19/2021 Cycle 2 FOLFIRINOX 07/02/2021, irinotecan and 5-FU dose reduced, prophylactic dexamethasone added beginning day of pump discontinuation Cycle 3 FOLFIRINOX 07/23/2021, Udenyca CT abdomen/pelvis 08/01/2021-done in the emergency department to evaluate epigastric pain-pancreas head mass decreased in size.  Diffuse gaseous distention of large and small bowel.  No bowel obstruction or focal inflammation. 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 Benay Spice reviewed pathology report from the Whipple procedure with Dr. Mariah Milling, adjuvant radiation not recommended. CT abdomen/pelvis at [facility] 03/10/2022-fat stranding at the pancreaticojejunostomy CTs at [facility] 05/29/2022-no evidence of metastatic disease, nonspecific colitis of the sigmoid colon, unchanged mild intrahepatic biliary ductal dilatation Obstructive jaundice secondary #1 Hypertension Admission in February 2022 with elevated liver enzymes MRI/MRCP-intra and extrahepatic biliary duct dilation, tapering smoothly to the ampulla, no mass or filling defect ERCP 10/06/2020-dilated main bile duct, biliary tree was swept and nothing was found, cytology from bile duct brushing-no malignant cells identified 5.  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  GERD 7.  Squamous cell carcinoma left forearm 2014 8.  Hyperlipidemia 9.  CT chest 08/01/2021, done in the emergency department to evaluate pleuritic chest pain-no PE.  Advanced three-vessel coronary vascular calcification. 10.  Fever of unknown origin beginning 08/14/2021-blood and urine cultures negative, no source for infection identified, placed on antibiotic prophylaxis beginning 08/17/2021, recurrent fever 10/09/2021-cultures negative, completed a course of Levaquin 11.  Fever/chills on 12/02/2021-urinalysis with clumps of white cells, culture positive for Staphylococcus epidermis and Enterococcus, completed a course of Augmentin Persistent fevers September 2023 ERCP 06/03/2022-stenotic bile duct orifice, biliary tract could not be accessed       Disposition: David Jarvis is in remission from pancreas cancer.  He has early satiety and reflux symptoms.  He is tolerating liquids without difficulty.  I suggested he increase his intake of liquid nutrition supplements.  He has a history of recurrent fever without a clear force for infection identified.  He is now maintained on empiric ciprofloxacin and Flagyl.  He is followed by  surgery and gastroenterology at [facility].  He underwent an ERCP this week.  This revealed stenosis of the bile duct.  He is meeting with Dr. Mariah Milling next week to discuss treatment options.  He reports he will be scheduled for surveillance imaging in December.  He will return for an office visit here on 08/22/2022.  I am available to see him sooner as needed.  Betsy Coder, MD  06/07/2022  10:55 AM

## 2022-06-08 LAB — CANCER ANTIGEN 19-9: CA 19-9: 12 U/mL (ref 0–35)

## 2022-06-11 DIAGNOSIS — C250 Malignant neoplasm of head of pancreas: Secondary | ICD-10-CM | POA: Diagnosis not present

## 2022-06-27 DIAGNOSIS — R7303 Prediabetes: Secondary | ICD-10-CM | POA: Diagnosis not present

## 2022-06-27 DIAGNOSIS — E782 Mixed hyperlipidemia: Secondary | ICD-10-CM | POA: Diagnosis not present

## 2022-06-27 DIAGNOSIS — N529 Male erectile dysfunction, unspecified: Secondary | ICD-10-CM | POA: Diagnosis not present

## 2022-06-27 DIAGNOSIS — C259 Malignant neoplasm of pancreas, unspecified: Secondary | ICD-10-CM | POA: Diagnosis not present

## 2022-06-27 DIAGNOSIS — I700 Atherosclerosis of aorta: Secondary | ICD-10-CM | POA: Diagnosis not present

## 2022-06-27 DIAGNOSIS — C61 Malignant neoplasm of prostate: Secondary | ICD-10-CM | POA: Diagnosis not present

## 2022-06-27 DIAGNOSIS — I10 Essential (primary) hypertension: Secondary | ICD-10-CM | POA: Diagnosis not present

## 2022-06-27 DIAGNOSIS — E039 Hypothyroidism, unspecified: Secondary | ICD-10-CM | POA: Diagnosis not present

## 2022-07-08 DIAGNOSIS — H5213 Myopia, bilateral: Secondary | ICD-10-CM | POA: Diagnosis not present

## 2022-07-08 DIAGNOSIS — H43813 Vitreous degeneration, bilateral: Secondary | ICD-10-CM | POA: Diagnosis not present

## 2022-07-08 DIAGNOSIS — H25813 Combined forms of age-related cataract, bilateral: Secondary | ICD-10-CM | POA: Diagnosis not present

## 2022-07-30 DIAGNOSIS — Z888 Allergy status to other drugs, medicaments and biological substances status: Secondary | ICD-10-CM | POA: Diagnosis not present

## 2022-07-30 DIAGNOSIS — I10 Essential (primary) hypertension: Secondary | ICD-10-CM | POA: Diagnosis not present

## 2022-07-30 DIAGNOSIS — Z90411 Acquired partial absence of pancreas: Secondary | ICD-10-CM | POA: Diagnosis not present

## 2022-07-30 DIAGNOSIS — E039 Hypothyroidism, unspecified: Secondary | ICD-10-CM | POA: Diagnosis not present

## 2022-07-30 DIAGNOSIS — K219 Gastro-esophageal reflux disease without esophagitis: Secondary | ICD-10-CM | POA: Diagnosis not present

## 2022-07-30 DIAGNOSIS — C250 Malignant neoplasm of head of pancreas: Secondary | ICD-10-CM | POA: Diagnosis not present

## 2022-07-30 DIAGNOSIS — Z7982 Long term (current) use of aspirin: Secondary | ICD-10-CM | POA: Diagnosis not present

## 2022-07-30 DIAGNOSIS — Z4659 Encounter for fitting and adjustment of other gastrointestinal appliance and device: Secondary | ICD-10-CM | POA: Diagnosis not present

## 2022-07-30 DIAGNOSIS — K831 Obstruction of bile duct: Secondary | ICD-10-CM | POA: Diagnosis not present

## 2022-07-30 DIAGNOSIS — Z79899 Other long term (current) drug therapy: Secondary | ICD-10-CM | POA: Diagnosis not present

## 2022-07-30 DIAGNOSIS — K838 Other specified diseases of biliary tract: Secondary | ICD-10-CM | POA: Diagnosis not present

## 2022-07-30 DIAGNOSIS — E785 Hyperlipidemia, unspecified: Secondary | ICD-10-CM | POA: Diagnosis not present

## 2022-07-30 DIAGNOSIS — Z85828 Personal history of other malignant neoplasm of skin: Secondary | ICD-10-CM | POA: Diagnosis not present

## 2022-07-30 DIAGNOSIS — Z8507 Personal history of malignant neoplasm of pancreas: Secondary | ICD-10-CM | POA: Diagnosis not present

## 2022-07-30 DIAGNOSIS — R509 Fever, unspecified: Secondary | ICD-10-CM | POA: Diagnosis not present

## 2022-08-15 DIAGNOSIS — I10 Essential (primary) hypertension: Secondary | ICD-10-CM | POA: Diagnosis not present

## 2022-08-15 DIAGNOSIS — K838 Other specified diseases of biliary tract: Secondary | ICD-10-CM | POA: Diagnosis not present

## 2022-08-15 DIAGNOSIS — E785 Hyperlipidemia, unspecified: Secondary | ICD-10-CM | POA: Diagnosis not present

## 2022-08-15 DIAGNOSIS — Z85828 Personal history of other malignant neoplasm of skin: Secondary | ICD-10-CM | POA: Diagnosis not present

## 2022-08-15 DIAGNOSIS — K219 Gastro-esophageal reflux disease without esophagitis: Secondary | ICD-10-CM | POA: Diagnosis not present

## 2022-08-15 DIAGNOSIS — Z8507 Personal history of malignant neoplasm of pancreas: Secondary | ICD-10-CM | POA: Diagnosis not present

## 2022-08-15 DIAGNOSIS — Z79899 Other long term (current) drug therapy: Secondary | ICD-10-CM | POA: Diagnosis not present

## 2022-08-15 DIAGNOSIS — Z888 Allergy status to other drugs, medicaments and biological substances status: Secondary | ICD-10-CM | POA: Diagnosis not present

## 2022-08-15 DIAGNOSIS — E039 Hypothyroidism, unspecified: Secondary | ICD-10-CM | POA: Diagnosis not present

## 2022-08-15 DIAGNOSIS — Z7982 Long term (current) use of aspirin: Secondary | ICD-10-CM | POA: Diagnosis not present

## 2022-08-15 DIAGNOSIS — K831 Obstruction of bile duct: Secondary | ICD-10-CM | POA: Diagnosis not present

## 2022-08-15 DIAGNOSIS — R509 Fever, unspecified: Secondary | ICD-10-CM | POA: Diagnosis not present

## 2022-08-15 DIAGNOSIS — N400 Enlarged prostate without lower urinary tract symptoms: Secondary | ICD-10-CM | POA: Diagnosis not present

## 2022-08-15 DIAGNOSIS — R7401 Elevation of levels of liver transaminase levels: Secondary | ICD-10-CM | POA: Diagnosis not present

## 2022-08-18 DIAGNOSIS — Z4659 Encounter for fitting and adjustment of other gastrointestinal appliance and device: Secondary | ICD-10-CM | POA: Diagnosis not present

## 2022-08-18 DIAGNOSIS — E785 Hyperlipidemia, unspecified: Secondary | ICD-10-CM | POA: Diagnosis not present

## 2022-08-18 DIAGNOSIS — R509 Fever, unspecified: Secondary | ICD-10-CM | POA: Diagnosis not present

## 2022-08-18 DIAGNOSIS — Z888 Allergy status to other drugs, medicaments and biological substances status: Secondary | ICD-10-CM | POA: Diagnosis not present

## 2022-08-18 DIAGNOSIS — Z79899 Other long term (current) drug therapy: Secondary | ICD-10-CM | POA: Diagnosis not present

## 2022-08-18 DIAGNOSIS — K8309 Other cholangitis: Secondary | ICD-10-CM | POA: Diagnosis not present

## 2022-08-18 DIAGNOSIS — I10 Essential (primary) hypertension: Secondary | ICD-10-CM | POA: Diagnosis not present

## 2022-08-18 DIAGNOSIS — K668 Other specified disorders of peritoneum: Secondary | ICD-10-CM | POA: Diagnosis not present

## 2022-08-18 DIAGNOSIS — K831 Obstruction of bile duct: Secondary | ICD-10-CM | POA: Diagnosis not present

## 2022-08-18 DIAGNOSIS — K9189 Other postprocedural complications and disorders of digestive system: Secondary | ICD-10-CM | POA: Diagnosis not present

## 2022-08-18 DIAGNOSIS — Z1152 Encounter for screening for COVID-19: Secondary | ICD-10-CM | POA: Diagnosis not present

## 2022-08-19 DIAGNOSIS — Z4659 Encounter for fitting and adjustment of other gastrointestinal appliance and device: Secondary | ICD-10-CM | POA: Diagnosis not present

## 2022-08-19 DIAGNOSIS — K8309 Other cholangitis: Secondary | ICD-10-CM | POA: Diagnosis not present

## 2022-08-19 DIAGNOSIS — K831 Obstruction of bile duct: Secondary | ICD-10-CM | POA: Diagnosis not present

## 2022-08-20 DIAGNOSIS — K668 Other specified disorders of peritoneum: Secondary | ICD-10-CM | POA: Diagnosis not present

## 2022-08-22 ENCOUNTER — Inpatient Hospital Stay: Payer: Medicare HMO | Admitting: Oncology

## 2022-09-03 DIAGNOSIS — Z4659 Encounter for fitting and adjustment of other gastrointestinal appliance and device: Secondary | ICD-10-CM | POA: Diagnosis not present

## 2022-09-03 DIAGNOSIS — Z90411 Acquired partial absence of pancreas: Secondary | ICD-10-CM | POA: Diagnosis not present

## 2022-09-03 DIAGNOSIS — K8681 Exocrine pancreatic insufficiency: Secondary | ICD-10-CM | POA: Diagnosis not present

## 2022-09-03 DIAGNOSIS — K831 Obstruction of bile duct: Secondary | ICD-10-CM | POA: Diagnosis not present

## 2022-09-03 DIAGNOSIS — Z4803 Encounter for change or removal of drains: Secondary | ICD-10-CM | POA: Diagnosis not present

## 2022-09-05 ENCOUNTER — Inpatient Hospital Stay: Payer: Medicare HMO | Attending: Oncology | Admitting: Oncology

## 2022-09-05 VITALS — BP 113/78 | HR 86 | Temp 98.1°F | Resp 20 | Ht 66.0 in | Wt 136.2 lb

## 2022-09-05 DIAGNOSIS — I10 Essential (primary) hypertension: Secondary | ICD-10-CM | POA: Diagnosis not present

## 2022-09-05 DIAGNOSIS — Z79899 Other long term (current) drug therapy: Secondary | ICD-10-CM | POA: Diagnosis not present

## 2022-09-05 DIAGNOSIS — C250 Malignant neoplasm of head of pancreas: Secondary | ICD-10-CM

## 2022-09-05 DIAGNOSIS — E785 Hyperlipidemia, unspecified: Secondary | ICD-10-CM | POA: Diagnosis not present

## 2022-09-05 DIAGNOSIS — K219 Gastro-esophageal reflux disease without esophagitis: Secondary | ICD-10-CM | POA: Diagnosis not present

## 2022-09-05 NOTE — Progress Notes (Signed)
[place] OFFICE PROGRESS NOTE   Diagnosis: Pancreas cancer  INTERVAL HISTORY:   David Jarvis returns for a scheduled visit.  He has undergone multiple procedures at [facility] for management of obstruction at hepaticojejunostomy.  He has intermittent fever.  He takes antibiotics, Tylenol, and ibuprofen when he has a fever.  He was admitted at [facility] from 08/18/2022 - 08/22/2022.  He last underwent percutaneous biliary external/internal drains on 08/19/2022.  He reports he is scheduled for upsizing of the biliary drain on 09/18/2022.  David. David Jarvis has a good appetite.  He reports a persistent "cold "feeling in the hands and feet.  No numbness.  No pain.    Objective:  Vital signs in last 24 hours:  Blood pressure 113/78, pulse 86, temperature 98.1 F (36.7 C), temperature source Oral, resp. rate 20, height '5\' 6"'$  (1.676 m), weight 136 lb 3.2 oz (61.8 kg), SpO2 100 %.     Lymphatics: No cervical, supraclavicular, axillary, or inguinal nodes Resp: Lungs clear bilaterally Cardio: Regular rate and rhythm GI: No hepatosplenomegaly, nontender, no mass Vascular: No leg edema  Lab Results:  Lab Results  Component Value Date   WBC 7.7 05/12/2022   HGB 13.6 05/12/2022   HCT 40.0 05/12/2022   MCV 83.9 05/12/2022   PLT 138 (L) 05/12/2022   NEUTROABS 6.7 05/12/2022    CMP  Lab Results  Component Value Date   NA 135 06/07/2022   K 4.1 06/07/2022   CL 99 06/07/2022   CO2 27 06/07/2022   GLUCOSE 186 (H) 06/07/2022   BUN 14 06/07/2022   CREATININE 1.30 (H) 06/07/2022   CALCIUM 9.1 06/07/2022   PROT 6.9 06/07/2022   ALBUMIN 4.2 06/07/2022   AST 63 (H) 06/07/2022   ALT 67 (H) 06/07/2022   ALKPHOS 369 (H) 06/07/2022   BILITOT 0.7 06/07/2022   GFRNONAA 59 (L) 06/07/2022    Lab Results  Component Value Date   CAN199 12 06/07/2022    Lab Results  Component Value Date   INR 1.1 12/02/2021   LABPROT 14.0 12/02/2021    Imaging:  No results found.  Medications: I  have reviewed the patient's current medications.   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, stent placed EUS 05/23/2021-25 x 15 mm pancreas head mass with irregular borders, no lymphadenopathy, no vascular involvement, T2 N0 by ultrasound CT chest 06/01/2021-negative for metastatic disease CT abdomen pancreas protocol 06/01/2021-3.2 x 2.3 cm hypoenhancing pancreas head mass inseparable from the third portion of the duodenum, 1 cm left retroperitoneal lymph node, no vascular involvement Cycle 1 FOLFIRINOX 06/19/2021 Cycle 2 FOLFIRINOX 07/02/2021, irinotecan and 5-FU dose reduced, prophylactic dexamethasone added beginning day of pump discontinuation Cycle 3 FOLFIRINOX 07/23/2021, Udenyca CT abdomen/pelvis 08/01/2021-done in the emergency department to evaluate epigastric pain-pancreas head mass decreased in size.  Diffuse gaseous distention of large and small bowel.  No bowel obstruction or focal inflammation. 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 CT abdomen/pelvis at [facility] 03/10/2022-fat stranding at the pancreaticojejunostomy CTs at [facility] 05/29/2022-no evidence of metastatic disease, nonspecific colitis of the sigmoid colon, unchanged mild intrahepatic biliary ductal dilatation Obstructive jaundice secondary #1 Hypertension Admission in February 2022 with elevated liver enzymes MRI/MRCP-intra and extrahepatic biliary duct dilation, tapering smoothly to the ampulla, no mass or filling defect ERCP 10/06/2020-dilated main bile duct, biliary tree was swept and nothing was found, cytology from bile duct brushing-no malignant cells identified 5.  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  GERD 7.  Squamous cell carcinoma left forearm 2014 8.  Hyperlipidemia 9.  CT chest 08/01/2021, done in the emergency department to evaluate pleuritic chest pain-no PE.  Advanced three-vessel coronary vascular calcification. 10.  Fever of unknown origin beginning 08/14/2021-blood and urine cultures negative, no source for infection identified, placed on antibiotic prophylaxis beginning 08/17/2021, recurrent fever 10/09/2021-cultures negative, completed a course of Levaquin 11.  Fever/chills on 12/02/2021-urinalysis with clumps of white cells, culture positive for Staphylococcus epidermis and Enterococcus, completed a course of Augmentin Persistent fevers September 2023 ERCP 06/03/2022-stenotic bile duct orifice, biliary tract could not be accessed Multiple percutaneous biliary drain procedures I do with placement of external/internal drains to manage a stricture at the hepaticojejunostomy       Disposition: David Jarvis is in clinical remission from pancreas cancer.  He is followed closely at [facility] for management of a post Whipple hepaticojejunostomy stricture.  Drains are in place.  He reports he is scheduled for  upsizing of the drains on 09/18/2022.  He suspects several more procedures to upsize the drains prior to removal of the external drain.  He takes antibiotics and antipyretics when he has a fever.  He will follow-up at [facility] as scheduled.  We are available to see him as needed.  He will return for an office visit in 3 months.  Betsy Coder, MD  09/05/2022  4:05 PM

## 2022-09-18 DIAGNOSIS — K838 Other specified diseases of biliary tract: Secondary | ICD-10-CM | POA: Diagnosis not present

## 2022-09-18 DIAGNOSIS — K831 Obstruction of bile duct: Secondary | ICD-10-CM | POA: Diagnosis not present

## 2022-09-18 DIAGNOSIS — Z4803 Encounter for change or removal of drains: Secondary | ICD-10-CM | POA: Diagnosis not present

## 2022-09-24 DIAGNOSIS — C250 Malignant neoplasm of head of pancreas: Secondary | ICD-10-CM | POA: Diagnosis not present

## 2022-10-01 DIAGNOSIS — K831 Obstruction of bile duct: Secondary | ICD-10-CM | POA: Diagnosis not present

## 2022-10-01 DIAGNOSIS — Z4803 Encounter for change or removal of drains: Secondary | ICD-10-CM | POA: Diagnosis not present

## 2022-10-01 DIAGNOSIS — Z4659 Encounter for fitting and adjustment of other gastrointestinal appliance and device: Secondary | ICD-10-CM | POA: Diagnosis not present

## 2022-10-10 DIAGNOSIS — I700 Atherosclerosis of aorta: Secondary | ICD-10-CM | POA: Diagnosis not present

## 2022-10-10 DIAGNOSIS — K219 Gastro-esophageal reflux disease without esophagitis: Secondary | ICD-10-CM | POA: Diagnosis not present

## 2022-10-10 DIAGNOSIS — K831 Obstruction of bile duct: Secondary | ICD-10-CM | POA: Diagnosis not present

## 2022-10-10 DIAGNOSIS — C259 Malignant neoplasm of pancreas, unspecified: Secondary | ICD-10-CM | POA: Diagnosis not present

## 2022-10-10 DIAGNOSIS — R42 Dizziness and giddiness: Secondary | ICD-10-CM | POA: Diagnosis not present

## 2022-10-10 DIAGNOSIS — E782 Mixed hyperlipidemia: Secondary | ICD-10-CM | POA: Diagnosis not present

## 2022-10-16 DIAGNOSIS — Z4659 Encounter for fitting and adjustment of other gastrointestinal appliance and device: Secondary | ICD-10-CM | POA: Diagnosis not present

## 2022-10-16 DIAGNOSIS — C250 Malignant neoplasm of head of pancreas: Secondary | ICD-10-CM | POA: Diagnosis not present

## 2022-10-16 DIAGNOSIS — Z4589 Encounter for adjustment and management of other implanted devices: Secondary | ICD-10-CM | POA: Diagnosis not present

## 2022-10-19 DIAGNOSIS — R69 Illness, unspecified: Secondary | ICD-10-CM | POA: Diagnosis not present

## 2022-10-22 DIAGNOSIS — C250 Malignant neoplasm of head of pancreas: Secondary | ICD-10-CM | POA: Diagnosis not present

## 2022-11-06 DIAGNOSIS — Z85828 Personal history of other malignant neoplasm of skin: Secondary | ICD-10-CM | POA: Diagnosis not present

## 2022-11-06 DIAGNOSIS — E785 Hyperlipidemia, unspecified: Secondary | ICD-10-CM | POA: Diagnosis not present

## 2022-11-06 DIAGNOSIS — Z008 Encounter for other general examination: Secondary | ICD-10-CM | POA: Diagnosis not present

## 2022-11-06 DIAGNOSIS — D84821 Immunodeficiency due to drugs: Secondary | ICD-10-CM | POA: Diagnosis not present

## 2022-11-06 DIAGNOSIS — K219 Gastro-esophageal reflux disease without esophagitis: Secondary | ICD-10-CM | POA: Diagnosis not present

## 2022-11-06 DIAGNOSIS — Z8507 Personal history of malignant neoplasm of pancreas: Secondary | ICD-10-CM | POA: Diagnosis not present

## 2022-11-06 DIAGNOSIS — I10 Essential (primary) hypertension: Secondary | ICD-10-CM | POA: Diagnosis not present

## 2022-11-06 DIAGNOSIS — E039 Hypothyroidism, unspecified: Secondary | ICD-10-CM | POA: Diagnosis not present

## 2022-11-06 DIAGNOSIS — N400 Enlarged prostate without lower urinary tract symptoms: Secondary | ICD-10-CM | POA: Diagnosis not present

## 2022-11-06 DIAGNOSIS — N529 Male erectile dysfunction, unspecified: Secondary | ICD-10-CM | POA: Diagnosis not present

## 2022-11-06 DIAGNOSIS — Z888 Allergy status to other drugs, medicaments and biological substances status: Secondary | ICD-10-CM | POA: Diagnosis not present

## 2022-12-02 DIAGNOSIS — R69 Illness, unspecified: Secondary | ICD-10-CM | POA: Diagnosis not present

## 2022-12-05 ENCOUNTER — Inpatient Hospital Stay: Payer: Medicare HMO | Attending: Oncology | Admitting: Oncology

## 2022-12-05 VITALS — BP 131/75 | HR 60 | Temp 98.1°F | Resp 18 | Ht 66.0 in | Wt 136.2 lb

## 2022-12-05 DIAGNOSIS — Z9221 Personal history of antineoplastic chemotherapy: Secondary | ICD-10-CM | POA: Diagnosis not present

## 2022-12-05 DIAGNOSIS — Z8507 Personal history of malignant neoplasm of pancreas: Secondary | ICD-10-CM | POA: Diagnosis not present

## 2022-12-05 DIAGNOSIS — E785 Hyperlipidemia, unspecified: Secondary | ICD-10-CM | POA: Insufficient documentation

## 2022-12-05 DIAGNOSIS — I10 Essential (primary) hypertension: Secondary | ICD-10-CM | POA: Insufficient documentation

## 2022-12-05 DIAGNOSIS — K219 Gastro-esophageal reflux disease without esophagitis: Secondary | ICD-10-CM | POA: Diagnosis not present

## 2022-12-05 DIAGNOSIS — C251 Malignant neoplasm of body of pancreas: Secondary | ICD-10-CM

## 2022-12-05 NOTE — Progress Notes (Signed)
[place] OFFICE PROGRESS NOTE   Diagnosis: Pancreas cancer  INTERVAL HISTORY:   David Jarvis returns as scheduled.  Biliary drains remain in place.  He reports a fever approximately 2 weeks ago.  He did not take antibiotics.  The fever was self-limited.  He continues follow-up with Dr. Mariah Milling for management of the biliary drains.  The drains have been exchanged on multiple occasions and upsized on 10/16/2022.  He is scheduled for reevaluation in May. No diarrhea.  Good appetite.  He is active getting out of the home.  Objective:  Vital signs in last 24 hours:  Blood pressure 131/75, pulse 60, temperature 98.1 F (36.7 C), temperature source Oral, resp. rate 18, height 5\' 6"  (1.676 m), weight 136 lb 3.2 oz (61.8 kg), SpO2 100 %.    Lymphatics: No cervical, supraclavicular, axillary, or inguinal nodes Resp: Lungs clear bilaterally Cardio: Regular rate and rhythm GI: No hepatosplenomegaly, right upper quadrant biliary drains, no mass, no apparent ascites Vascular: No leg edema   Lab Results:  Lab Results  Component Value Date   WBC 7.7 05/12/2022   HGB 13.6 05/12/2022   HCT 40.0 05/12/2022   MCV 83.9 05/12/2022   PLT 138 (L) 05/12/2022   NEUTROABS 6.7 05/12/2022    CMP  Lab Results  Component Value Date   NA 135 06/07/2022   K 4.1 06/07/2022   CL 99 06/07/2022   CO2 27 06/07/2022   GLUCOSE 186 (H) 06/07/2022   BUN 14 06/07/2022   CREATININE 1.30 (H) 06/07/2022   CALCIUM 9.1 06/07/2022   PROT 6.9 06/07/2022   ALBUMIN 4.2 06/07/2022   AST 63 (H) 06/07/2022   ALT 67 (H) 06/07/2022   ALKPHOS 369 (H) 06/07/2022   BILITOT 0.7 06/07/2022   GFRNONAA 59 (L) 06/07/2022     Medications: I have reviewed the patient's current medications.   Assessment/Plan:  Pancreas cancer, adenocarcinoma MRI/MRCP abdomen 05/18/2021-diffuse biliary and pancreatic duct dilation, 2.8 cm mass in the pancreas head, no evidence of metastatic disease ERCP 05/23/2021-localized  stricture in the lower third of the main bile duct, stent placed EUS 05/23/2021-25 x 15 mm pancreas head mass with irregular borders, no lymphadenopathy, no vascular involvement, T2 N0 by ultrasound CT chest 06/01/2021-negative for metastatic disease CT abdomen pancreas protocol 06/01/2021-3.2 x 2.3 cm hypoenhancing pancreas head mass inseparable from the third portion of the duodenum, 1 cm left retroperitoneal lymph node, no vascular involvement Cycle 1 FOLFIRINOX 06/19/2021 Cycle 2 FOLFIRINOX 07/02/2021, irinotecan and 5-FU dose reduced, prophylactic dexamethasone added beginning day of pump discontinuation Cycle 3 FOLFIRINOX 07/23/2021, Udenyca CT abdomen/pelvis 08/01/2021-done in the emergency department to evaluate epigastric pain-pancreas head mass decreased in size.  Diffuse gaseous distention of large and small bowel.  No bowel obstruction or focal inflammation. Cycle 4 FOLFIRINOX 08/06/2021, Udenyca Cycle 5 FOLFIRINOX 08/20/2021, Udenyca Cycle 6 FOLFIRINOX 09/04/2021, Udenyca Cycle 7 FOLFIRINOX 09/17/2021, Udenyca Cycle 8 FOLFIRINOX 10/01/2021, Udenyca CTs at [facility] 10/16/2021-decrease size of pancreas uncinate mass with similar stranding adjacent to the celiac axis/hepatic takeoff, new linear band of hypoattenuation in the left liver Pancreaticoduodenectomy at 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 Benay Spice reviewed pathology report from the Whipple procedure with Dr. Mariah Milling, adjuvant radiation not recommended. CT  abdomen/pelvis at [facility] 03/10/2022-fat stranding at the pancreaticojejunostomy CTs at [facility] 05/29/2022-no evidence of  metastatic disease, nonspecific colitis of the sigmoid colon, unchanged mild intrahepatic biliary ductal dilatation Obstructive jaundice secondary #1 Hypertension Admission in February 2022 with elevated liver enzymes MRI/MRCP-intra and extrahepatic biliary duct dilation, tapering smoothly to the ampulla, no mass or filling defect ERCP 10/06/2020-dilated main bile duct, biliary tree was swept and nothing was found, cytology from bile duct brushing-no malignant cells identified 5.  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  GERD 7.  Squamous cell carcinoma left forearm 2014 8.  Hyperlipidemia 9.  CT chest 08/01/2021, done in the emergency department to evaluate pleuritic chest pain-no PE.  Advanced three-vessel coronary vascular calcification. 10.  Fever of unknown origin beginning 08/14/2021-blood and urine cultures negative, no source for infection identified, placed on antibiotic prophylaxis beginning 08/17/2021, recurrent fever 10/09/2021-cultures negative, completed a course of Levaquin 11.  Fever/chills on 12/02/2021-urinalysis with clumps of white cells, culture positive for Staphylococcus epidermis and Enterococcus, completed a course of Augmentin Persistent fevers September 2023 ERCP 06/03/2022-stenotic bile duct orifice, biliary tract could not be accessed Multiple percutaneous biliary drain procedures at [facility] with placement of external/internal drains to manage a stricture at the hepaticojejunostomy, last procedure to upsize the drains on 10/16/2022        Disposition: David Jarvis is in remission from pancreas cancer.  He continues close follow-up at [facility] for management of a biliary stricture.  He is scheduled for follow-up at [facility] next week.  He reports the plan is to reevaluate the biliary drains in May.  He will return for an office visit in 3 months.  I am available to see him sooner as needed.  Betsy Coder, MD  12/05/2022  9:49 AM

## 2022-12-09 DIAGNOSIS — R69 Illness, unspecified: Secondary | ICD-10-CM | POA: Diagnosis not present

## 2022-12-10 DIAGNOSIS — C250 Malignant neoplasm of head of pancreas: Secondary | ICD-10-CM | POA: Diagnosis not present

## 2022-12-12 DIAGNOSIS — I10 Essential (primary) hypertension: Secondary | ICD-10-CM | POA: Diagnosis not present

## 2022-12-12 DIAGNOSIS — E8809 Other disorders of plasma-protein metabolism, not elsewhere classified: Secondary | ICD-10-CM | POA: Diagnosis not present

## 2022-12-12 DIAGNOSIS — M25473 Effusion, unspecified ankle: Secondary | ICD-10-CM | POA: Diagnosis not present

## 2023-01-02 DIAGNOSIS — C61 Malignant neoplasm of prostate: Secondary | ICD-10-CM | POA: Diagnosis not present

## 2023-01-02 DIAGNOSIS — I10 Essential (primary) hypertension: Secondary | ICD-10-CM | POA: Diagnosis not present

## 2023-01-02 DIAGNOSIS — K831 Obstruction of bile duct: Secondary | ICD-10-CM | POA: Diagnosis not present

## 2023-01-02 DIAGNOSIS — G620 Drug-induced polyneuropathy: Secondary | ICD-10-CM | POA: Diagnosis not present

## 2023-01-02 DIAGNOSIS — E782 Mixed hyperlipidemia: Secondary | ICD-10-CM | POA: Diagnosis not present

## 2023-01-02 DIAGNOSIS — Z0000 Encounter for general adult medical examination without abnormal findings: Secondary | ICD-10-CM | POA: Diagnosis not present

## 2023-01-02 DIAGNOSIS — E039 Hypothyroidism, unspecified: Secondary | ICD-10-CM | POA: Diagnosis not present

## 2023-01-02 DIAGNOSIS — Z1389 Encounter for screening for other disorder: Secondary | ICD-10-CM | POA: Diagnosis not present

## 2023-01-02 DIAGNOSIS — I700 Atherosclerosis of aorta: Secondary | ICD-10-CM | POA: Diagnosis not present

## 2023-01-02 DIAGNOSIS — R7303 Prediabetes: Secondary | ICD-10-CM | POA: Diagnosis not present

## 2023-01-06 DIAGNOSIS — K831 Obstruction of bile duct: Secondary | ICD-10-CM | POA: Diagnosis not present

## 2023-01-06 DIAGNOSIS — K838 Other specified diseases of biliary tract: Secondary | ICD-10-CM | POA: Diagnosis not present

## 2023-01-06 DIAGNOSIS — Z8507 Personal history of malignant neoplasm of pancreas: Secondary | ICD-10-CM | POA: Diagnosis not present

## 2023-01-06 DIAGNOSIS — C259 Malignant neoplasm of pancreas, unspecified: Secondary | ICD-10-CM | POA: Diagnosis not present

## 2023-01-06 DIAGNOSIS — C250 Malignant neoplasm of head of pancreas: Secondary | ICD-10-CM | POA: Diagnosis not present

## 2023-01-06 DIAGNOSIS — Z90411 Acquired partial absence of pancreas: Secondary | ICD-10-CM | POA: Diagnosis not present

## 2023-01-06 DIAGNOSIS — R8569 Abnormal cytological findings in specimens from other digestive organs and abdominal cavity: Secondary | ICD-10-CM | POA: Diagnosis not present

## 2023-01-06 DIAGNOSIS — Z9049 Acquired absence of other specified parts of digestive tract: Secondary | ICD-10-CM | POA: Diagnosis not present

## 2023-01-06 DIAGNOSIS — Z4659 Encounter for fitting and adjustment of other gastrointestinal appliance and device: Secondary | ICD-10-CM | POA: Diagnosis not present

## 2023-01-06 DIAGNOSIS — Z08 Encounter for follow-up examination after completed treatment for malignant neoplasm: Secondary | ICD-10-CM | POA: Diagnosis not present

## 2023-01-06 DIAGNOSIS — K8689 Other specified diseases of pancreas: Secondary | ICD-10-CM | POA: Diagnosis not present

## 2023-01-13 DIAGNOSIS — R69 Illness, unspecified: Secondary | ICD-10-CM | POA: Diagnosis not present

## 2023-01-20 DIAGNOSIS — R69 Illness, unspecified: Secondary | ICD-10-CM | POA: Diagnosis not present

## 2023-01-27 DIAGNOSIS — R69 Illness, unspecified: Secondary | ICD-10-CM | POA: Diagnosis not present

## 2023-02-07 DIAGNOSIS — C250 Malignant neoplasm of head of pancreas: Secondary | ICD-10-CM | POA: Diagnosis not present

## 2023-02-07 DIAGNOSIS — K831 Obstruction of bile duct: Secondary | ICD-10-CM | POA: Diagnosis not present

## 2023-02-07 DIAGNOSIS — Z4659 Encounter for fitting and adjustment of other gastrointestinal appliance and device: Secondary | ICD-10-CM | POA: Diagnosis not present

## 2023-02-07 DIAGNOSIS — Z434 Encounter for attention to other artificial openings of digestive tract: Secondary | ICD-10-CM | POA: Diagnosis not present

## 2023-03-08 DIAGNOSIS — Z90411 Acquired partial absence of pancreas: Secondary | ICD-10-CM | POA: Diagnosis not present

## 2023-03-08 DIAGNOSIS — Z9049 Acquired absence of other specified parts of digestive tract: Secondary | ICD-10-CM | POA: Diagnosis not present

## 2023-03-08 DIAGNOSIS — Z79899 Other long term (current) drug therapy: Secondary | ICD-10-CM | POA: Diagnosis not present

## 2023-03-08 DIAGNOSIS — R509 Fever, unspecified: Secondary | ICD-10-CM | POA: Diagnosis not present

## 2023-03-08 DIAGNOSIS — Z5181 Encounter for therapeutic drug level monitoring: Secondary | ICD-10-CM | POA: Diagnosis not present

## 2023-03-08 DIAGNOSIS — Z8507 Personal history of malignant neoplasm of pancreas: Secondary | ICD-10-CM | POA: Diagnosis not present

## 2023-03-08 DIAGNOSIS — Z7982 Long term (current) use of aspirin: Secondary | ICD-10-CM | POA: Diagnosis not present

## 2023-03-08 DIAGNOSIS — R932 Abnormal findings on diagnostic imaging of liver and biliary tract: Secondary | ICD-10-CM | POA: Diagnosis not present

## 2023-03-08 DIAGNOSIS — Z20822 Contact with and (suspected) exposure to covid-19: Secondary | ICD-10-CM | POA: Diagnosis not present

## 2023-03-08 DIAGNOSIS — R5082 Postprocedural fever: Secondary | ICD-10-CM | POA: Diagnosis not present

## 2023-03-08 DIAGNOSIS — R10813 Right lower quadrant abdominal tenderness: Secondary | ICD-10-CM | POA: Diagnosis not present

## 2023-03-13 ENCOUNTER — Inpatient Hospital Stay: Payer: Medicare HMO | Attending: Oncology | Admitting: Oncology

## 2023-03-13 VITALS — BP 106/59 | HR 60 | Temp 98.1°F | Resp 16 | Wt 141.0 lb

## 2023-03-13 DIAGNOSIS — Z85828 Personal history of other malignant neoplasm of skin: Secondary | ICD-10-CM | POA: Diagnosis not present

## 2023-03-13 DIAGNOSIS — Z85820 Personal history of malignant melanoma of skin: Secondary | ICD-10-CM | POA: Diagnosis not present

## 2023-03-13 DIAGNOSIS — E785 Hyperlipidemia, unspecified: Secondary | ICD-10-CM | POA: Diagnosis not present

## 2023-03-13 DIAGNOSIS — C251 Malignant neoplasm of body of pancreas: Secondary | ICD-10-CM

## 2023-03-13 DIAGNOSIS — Z8507 Personal history of malignant neoplasm of pancreas: Secondary | ICD-10-CM | POA: Diagnosis not present

## 2023-03-13 DIAGNOSIS — Z9221 Personal history of antineoplastic chemotherapy: Secondary | ICD-10-CM | POA: Insufficient documentation

## 2023-03-13 DIAGNOSIS — I10 Essential (primary) hypertension: Secondary | ICD-10-CM | POA: Diagnosis not present

## 2023-03-13 DIAGNOSIS — K219 Gastro-esophageal reflux disease without esophagitis: Secondary | ICD-10-CM | POA: Insufficient documentation

## 2023-03-13 NOTE — Progress Notes (Signed)
[facility] OFFICE PROGRESS NOTE   Diagnosis: Pancreas cancer  INTERVAL HISTORY:   [name] returns as scheduled.  He continues follow-up at [facility] for management of the biliary stricture.  Biliary drains were removed on 02/07/2023.  He continues to have intermittent fever.  He had a fever on 03/08/2023 and was seen at [facility].  He reports the fever resolved prior to his arrival at the emergency room.  A CT abdomen/pelvis revealed mild enhancement of the common bile duct and central hepatic ducts.  No evidence of recurrent pancreatic cancer  He is exercising.  Good appetite.  He has decreased his food intake as he feels large meals contribute to the biliary fever. Objective:  Vital signs in last 24 hours:  Blood pressure (!) 106/59, pulse 60, temperature 98.1 F (36.7 C), temperature source Temporal, resp. rate 16, weight 141 lb (64 kg), SpO2 99%.    Lymphatics: No cervical, supraclavicular, axillary, or inguinal nodes Resp: Lungs clear bilaterally Cardio: Regular rate and rhythm GI: No mass, nontender, no hepatosplenomegaly Vascular: No leg edema   Lab Results:  Lab Results  Component Value Date   WBC 7.7 05/12/2022   HGB 13.6 05/12/2022   HCT 40.0 05/12/2022   MCV 83.9 05/12/2022   PLT 138 (L) 05/12/2022   NEUTROABS 6.7 05/12/2022    CMP  Lab Results  Component Value Date   NA 135 06/07/2022   K 4.1 06/07/2022   CL 99 06/07/2022   CO2 27 06/07/2022   GLUCOSE 186 (H) 06/07/2022   BUN 14 06/07/2022   CREATININE 1.30 (H) 06/07/2022   CALCIUM 9.1 06/07/2022   PROT 6.9 06/07/2022   ALBUMIN 4.2 06/07/2022   AST 63 (H) 06/07/2022   ALT 67 (H) 06/07/2022   ALKPHOS 369 (H) 06/07/2022   BILITOT 0.7 06/07/2022   GFRNONAA 59 (L) 06/07/2022    Lab Results  Component Value Date   CAN199 12 06/07/2022    Lab Results  Component Value Date   INR 1.1 12/02/2021   LABPROT 14.0 12/02/2021    Imaging:  No results found.  Medications: I have reviewed  the patient's current medications.   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, stent placed EUS 05/23/2021-25 x 15 mm pancreas head mass with irregular borders, no lymphadenopathy, no vascular involvement, T2 N0 by ultrasound CT chest 06/01/2021-negative for metastatic disease CT abdomen pancreas protocol 06/01/2021-3.2 x 2.3 cm hypoenhancing pancreas head mass inseparable from the third portion of the duodenum, 1 cm left retroperitoneal lymph node, no vascular involvement Cycle 1 FOLFIRINOX 06/19/2021 Cycle 2 FOLFIRINOX 07/02/2021, irinotecan and 5-FU dose reduced, prophylactic dexamethasone added beginning day of pump discontinuation Cycle 3 FOLFIRINOX 07/23/2021, Udenyca CT abdomen/pelvis 08/01/2021-done in the emergency department to evaluate epigastric pain-pancreas head mass decreased in size.  Diffuse gaseous distention of large and small bowel.  No bowel obstruction or focal inflammation. 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 Truett Perna reviewed pathology report from the Whipple procedure with Dr. Kathrynn Ducking, adjuvant radiation not recommended. CT abdomen/pelvis at [facility] 03/10/2022-fat stranding at the pancreaticojejunostomy CTs at [facility] 05/29/2022-no evidence of metastatic disease, nonspecific colitis of the sigmoid colon, unchanged mild intrahepatic biliary ductal dilatation CT pancreas protocol abdomen/pelvis at [facility] 01/06/2023-resolution of hepatic fluid collection, percutaneous biliary drains in position, no evidence of recurrent or metastatic disease in the abdomen or pelvis Obstructive jaundice secondary #1 Hypertension Admission in February 2022 with elevated liver enzymes MRI/MRCP-intra and extrahepatic biliary duct dilation, tapering smoothly to the ampulla, no mass or filling defect ERCP 10/06/2020-dilated main bile duct, biliary tree was swept and nothing was found, cytology from bile duct brushing-no malignant cells identified 5.  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  GERD 7.  Squamous cell carcinoma left forearm 2014 8.  Hyperlipidemia 9.  CT chest 08/01/2021, done in the emergency department to evaluate pleuritic chest pain-no PE.  Advanced three-vessel coronary vascular calcification. 10.  Fever of unknown origin beginning 08/14/2021-blood and urine cultures negative, no source for infection identified, placed on antibiotic prophylaxis beginning 08/17/2021, recurrent fever 10/09/2021-cultures negative, completed a course of Levaquin 11.  Fever/chills on 12/02/2021-urinalysis with clumps of white cells, culture positive for Staphylococcus epidermis and Enterococcus, completed a course of Augmentin Persistent fevers September 2023 ERCP 06/03/2022-stenotic bile duct orifice, biliary tract could not be accessed Multiple percutaneous biliary drain procedures at [facility] with placement of external/internal drains to manage a stricture at the hepaticojejunostomy, last procedure to upsize the drains on  10/16/2022 02/07/2023-external biliary drains removed      Disposition: Mr. Razzano is in clinical remission from pancreas cancer.  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  He is followed by interventional radiology and surgery at [facility].  He will return for an office visit in 4 months.  I am available to see him in the interim as needed.  Thornton Papas, MD  03/13/2023  10:48 AM

## 2023-03-19 DIAGNOSIS — R69 Illness, unspecified: Secondary | ICD-10-CM | POA: Diagnosis not present

## 2023-04-11 DIAGNOSIS — N401 Enlarged prostate with lower urinary tract symptoms: Secondary | ICD-10-CM | POA: Diagnosis not present

## 2023-04-11 DIAGNOSIS — C61 Malignant neoplasm of prostate: Secondary | ICD-10-CM | POA: Diagnosis not present

## 2023-04-11 DIAGNOSIS — R972 Elevated prostate specific antigen [PSA]: Secondary | ICD-10-CM | POA: Diagnosis not present

## 2023-04-11 DIAGNOSIS — R351 Nocturia: Secondary | ICD-10-CM | POA: Diagnosis not present

## 2023-05-09 DIAGNOSIS — E039 Hypothyroidism, unspecified: Secondary | ICD-10-CM | POA: Diagnosis not present

## 2023-05-09 DIAGNOSIS — R7303 Prediabetes: Secondary | ICD-10-CM | POA: Diagnosis not present

## 2023-05-09 DIAGNOSIS — I700 Atherosclerosis of aorta: Secondary | ICD-10-CM | POA: Diagnosis not present

## 2023-05-09 DIAGNOSIS — I10 Essential (primary) hypertension: Secondary | ICD-10-CM | POA: Diagnosis not present

## 2023-05-09 DIAGNOSIS — G620 Drug-induced polyneuropathy: Secondary | ICD-10-CM | POA: Diagnosis not present

## 2023-05-09 DIAGNOSIS — C61 Malignant neoplasm of prostate: Secondary | ICD-10-CM | POA: Diagnosis not present

## 2023-05-09 DIAGNOSIS — E782 Mixed hyperlipidemia: Secondary | ICD-10-CM | POA: Diagnosis not present

## 2023-05-09 DIAGNOSIS — K219 Gastro-esophageal reflux disease without esophagitis: Secondary | ICD-10-CM | POA: Diagnosis not present

## 2023-05-09 DIAGNOSIS — Z8507 Personal history of malignant neoplasm of pancreas: Secondary | ICD-10-CM | POA: Diagnosis not present

## 2023-05-28 DIAGNOSIS — C250 Malignant neoplasm of head of pancreas: Secondary | ICD-10-CM | POA: Diagnosis not present

## 2023-05-28 DIAGNOSIS — Z9049 Acquired absence of other specified parts of digestive tract: Secondary | ICD-10-CM | POA: Diagnosis not present

## 2023-06-16 DIAGNOSIS — R69 Illness, unspecified: Secondary | ICD-10-CM | POA: Diagnosis not present

## 2023-06-16 NOTE — Telephone Encounter (Signed)
error 

## 2023-06-16 NOTE — Telephone Encounter (Signed)
Error

## 2023-06-18 IMAGING — RF DG ERCP WO/W SPHINCTEROTOMY
1 series · 10 of 10 positions shown · non-contrast
Comparison: MRCP 05/18/2021

CLINICAL DATA: Elevated LFTs, biliary ductal dilatation

EXAM:
ERCP
TECHNIQUE: Multiple spot images obtained with the fluoroscopic device and
submitted for interpretation post-procedure.
FLUOROSCOPY TIME:  Fluoroscopy Time:  1 minute 46 seconds
Number of Acquired Spot Images: 0

[Series 1: run · 10 of 10 slices shown]
[im 1/10]
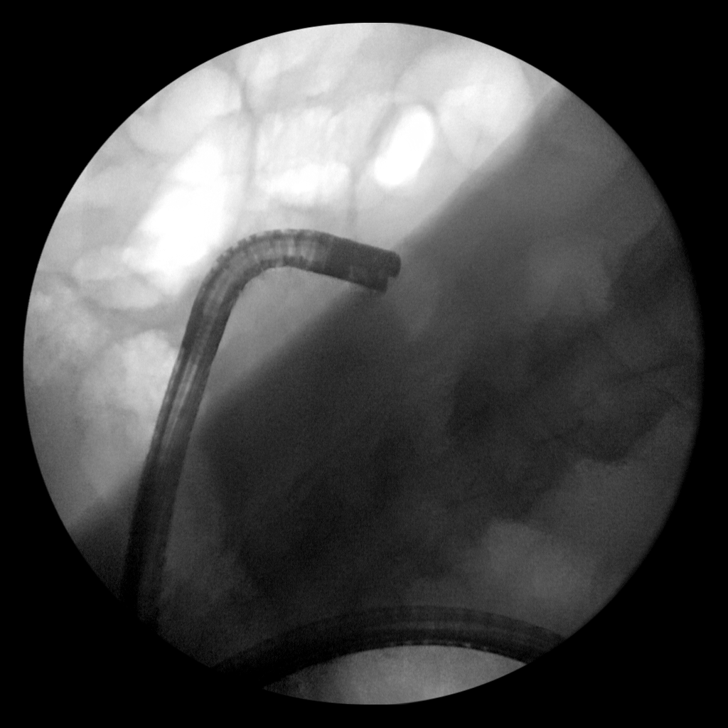
[im 2/10]
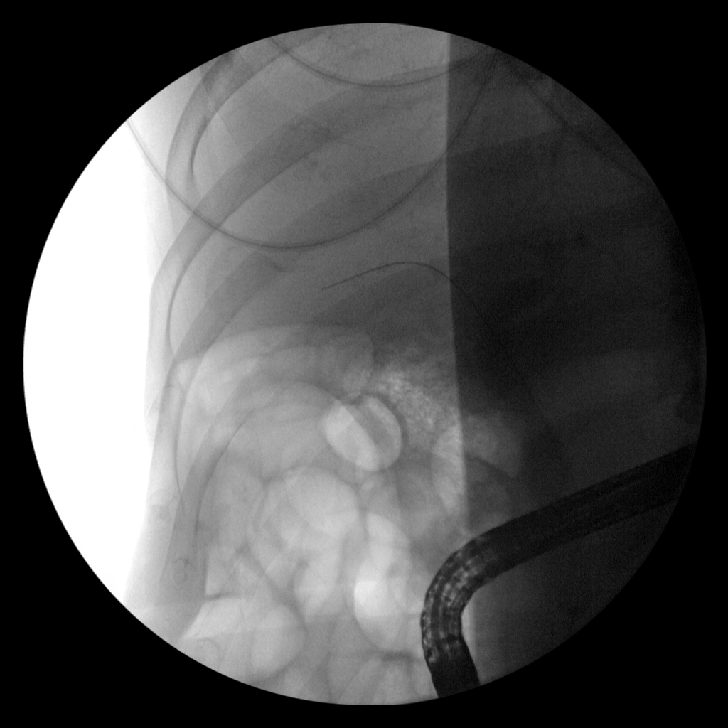
[im 3/10]
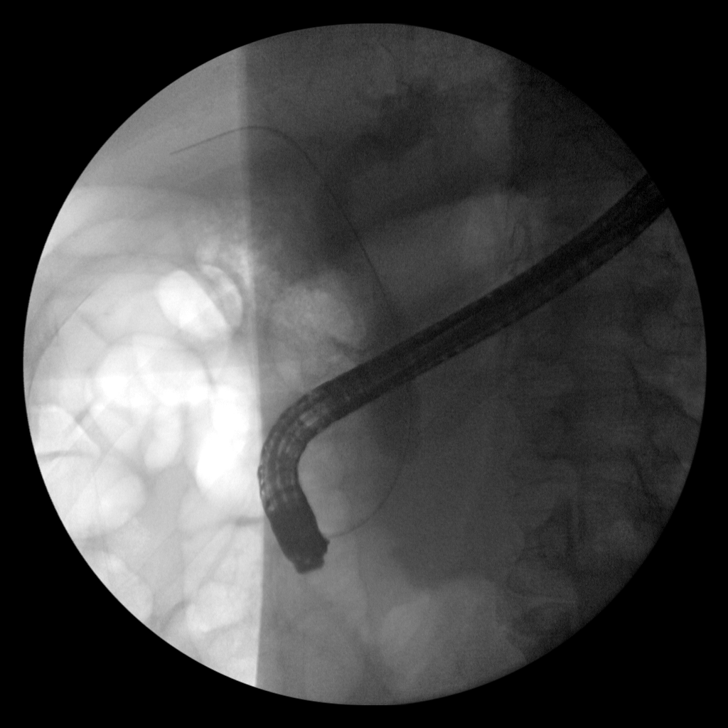
[im 4/10]
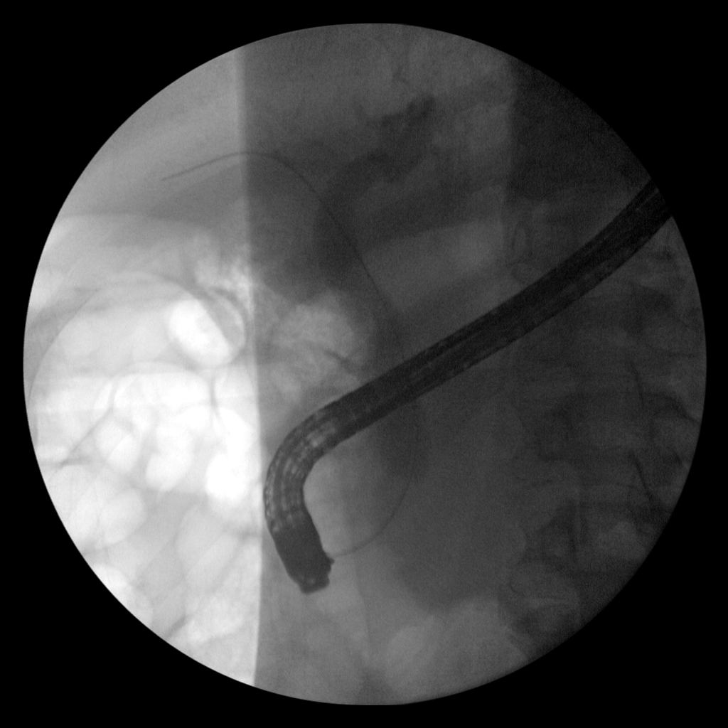
[im 5/10]
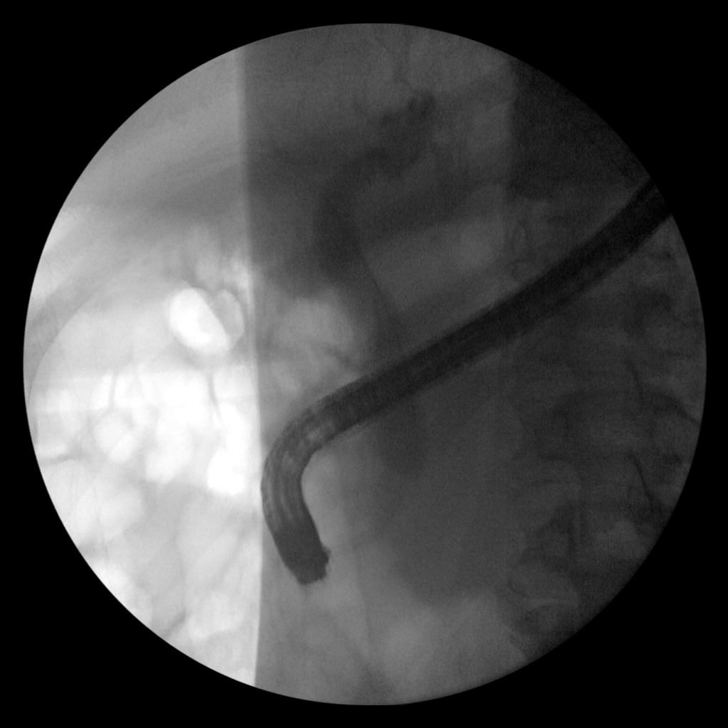
[im 6/10]
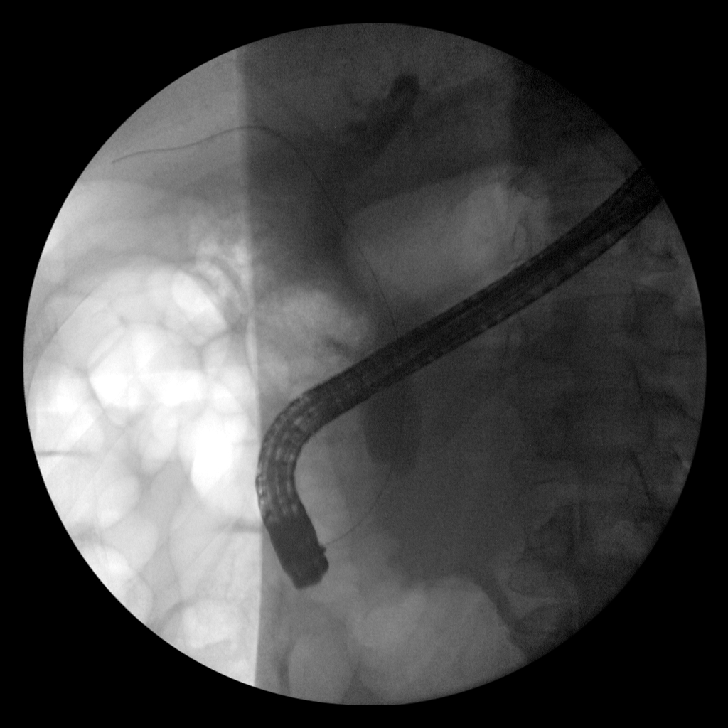
[im 7/10]
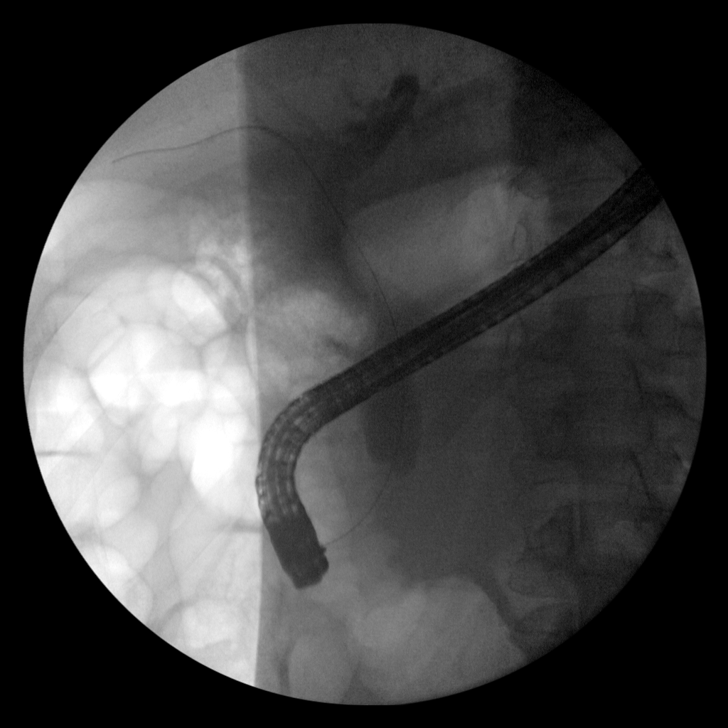
[im 8/10]
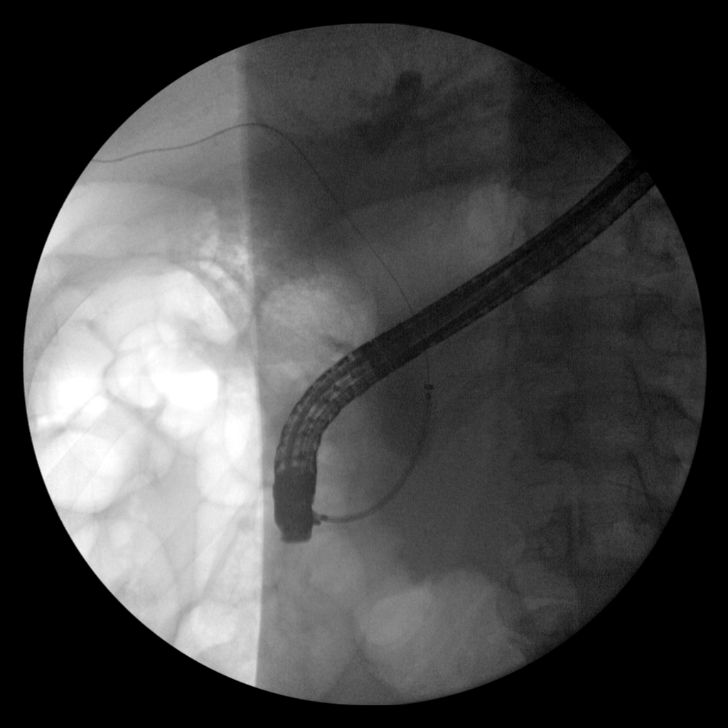
[im 9/10]
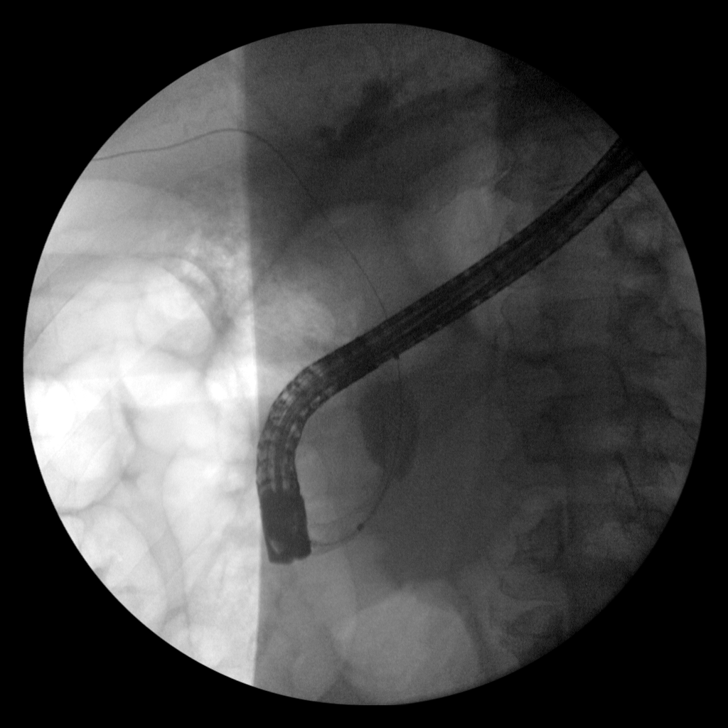
[im 10/10]
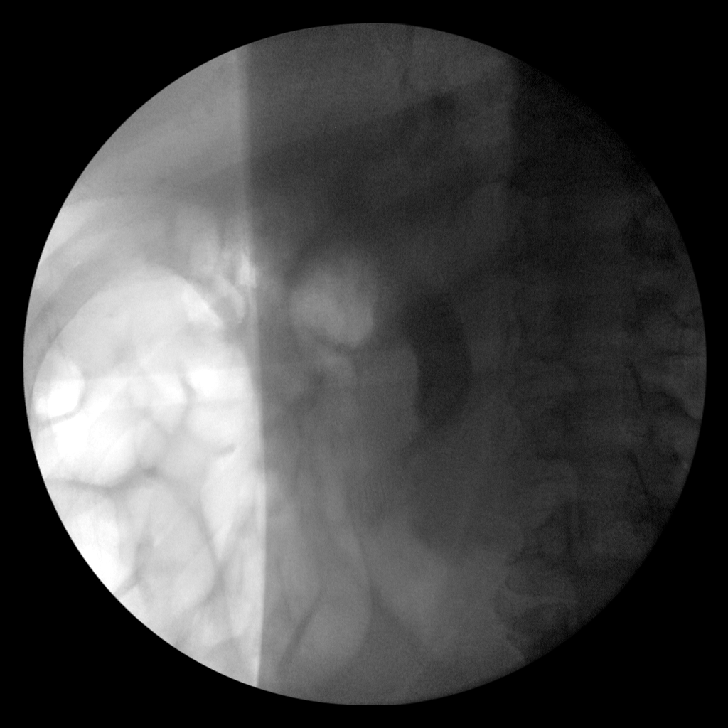

[10 of 10 positions shown; findings below may reference images not displayed]

FINDINGS: A total of 10 intraoperative saved images are submitted for review.
The images demonstrate a flexible duodenal scope in the descending
duodenum with wire cannulation of the common bile duct. Subsequent
cholangiogram demonstrates diffuse biliary ductal dilatation with
occlusion of the distal common bile duct. On the subsequent images,
a self expanding metallic biliary stent has been placed.
IMPRESSION: 1. Distal biliary duct occlusion with associated ductal dilatation
proximally.
2. Placement of a self expanding metal biliary stent.

These images were submitted for radiologic interpretation only.
Please see the procedural report for the amount of contrast and the
fluoroscopy time utilized.

## 2023-06-19 DIAGNOSIS — R69 Illness, unspecified: Secondary | ICD-10-CM | POA: Diagnosis not present

## 2023-06-23 DIAGNOSIS — R69 Illness, unspecified: Secondary | ICD-10-CM | POA: Diagnosis not present

## 2023-06-26 DIAGNOSIS — R69 Illness, unspecified: Secondary | ICD-10-CM | POA: Diagnosis not present

## 2023-06-30 DIAGNOSIS — R69 Illness, unspecified: Secondary | ICD-10-CM | POA: Diagnosis not present

## 2023-07-03 DIAGNOSIS — R69 Illness, unspecified: Secondary | ICD-10-CM | POA: Diagnosis not present

## 2023-07-07 DIAGNOSIS — R69 Illness, unspecified: Secondary | ICD-10-CM | POA: Diagnosis not present

## 2023-07-07 IMAGING — US IR IMAGING GUIDED PORT INSERTION
1 series · 1 of 1 positions shown · non-contrast
Comparison: Chest CT-06/01/2021

INDICATION: Pancreatic cancer. In need of durable intravenous access for the
initiation of chemotherapy.

EXAM:
IMPLANTED PORT A CATH PLACEMENT WITH ULTRASOUND AND FLUOROSCOPIC
GUIDANCE

[Series 1: ir imaging guided port insertion · 1 of 1 slices shown]
[im 1/1]
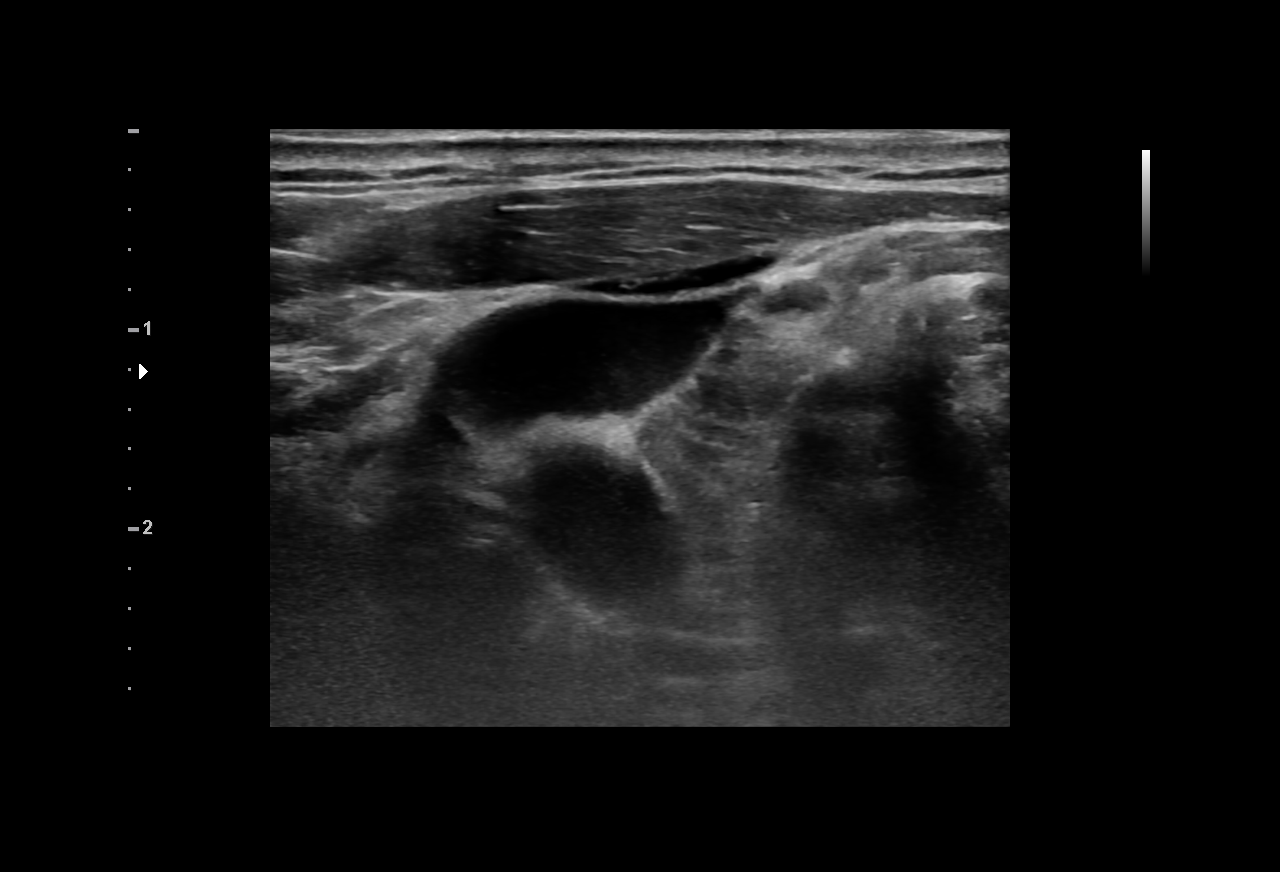

[1 of 1 positions shown; findings below may reference images not displayed]

MEDICATIONS:
None

ANESTHESIA/SEDATION:
Moderate (conscious) sedation was employed during this procedure and
was administered by the [REDACTED].

A total of Versed 1 mg and Fentanyl 50 mcg was administered
intravenously.

Moderate Sedation Time: 24 minutes. The patient's level of
consciousness and vital signs were monitored continuously by
radiology nursing throughout the procedure under my direct
supervision.

CONTRAST:  None

FLUOROSCOPY TIME:  12 seconds (4 mGy)

COMPLICATIONS:
None immediate.

PROCEDURE:
The procedure, risks, benefits, and alternatives were explained to
the patient. Questions regarding the procedure were encouraged and
answered. The patient understands and consents to the procedure.

The right neck and chest were prepped with chlorhexidine in a
sterile fashion, and a sterile drape was applied covering the
operative field. Maximum barrier sterile technique with sterile
gowns and gloves were used for the procedure. A timeout was
performed prior to the initiation of the procedure. Local anesthesia
was provided with 1% lidocaine with epinephrine.

After creating a small venotomy incision, a micropuncture kit was
utilized to access the internal jugular vein. Real-time ultrasound
guidance was utilized for vascular access including the acquisition
of a permanent ultrasound image documenting patency of the accessed
vessel. The microwire was utilized to measure appropriate catheter
length.

A subcutaneous port pocket was then created along the upper chest
wall utilizing a combination of sharp and blunt dissection. The
pocket was irrigated with sterile saline. A single lumen slim sized
power injectable port was chosen for placement. The 8 Fr catheter
was tunneled from the port pocket site to the venotomy incision. The
port was placed in the pocket. The external catheter was trimmed to
appropriate length. At the venotomy, an 8 Fr peel-away sheath was
placed over a guidewire under fluoroscopic guidance. The catheter
was then placed through the sheath and the sheath was removed. Final
catheter positioning was confirmed and documented with a
fluoroscopic spot radiograph. The port was accessed with Rodnick Darji
needle, aspirated and flushed with heparinized saline.

The venotomy site was closed with an interrupted 4-0 Vicryl suture.
The port pocket incision was closed with interrupted 2-0 Vicryl
suture. Dermabond and Ceasar were applied to both incisions.
Dressings were applied. The patient tolerated the procedure well
without immediate post procedural complication.
FINDINGS: After catheter placement, the tip lies within the superior
cavoatrial junction. The catheter aspirates and flushes normally and
is ready for immediate use.
IMPRESSION: Successful placement of a right internal jugular approach power
injectable Port-A-Cath. The catheter is ready for immediate use.

## 2023-07-10 DIAGNOSIS — R69 Illness, unspecified: Secondary | ICD-10-CM | POA: Diagnosis not present

## 2023-07-14 ENCOUNTER — Inpatient Hospital Stay: Payer: Medicare HMO

## 2023-07-14 ENCOUNTER — Encounter: Payer: Self-pay | Admitting: Oncology

## 2023-07-14 ENCOUNTER — Inpatient Hospital Stay: Payer: Medicare HMO | Attending: Oncology | Admitting: Oncology

## 2023-07-14 VITALS — BP 108/74 | HR 60 | Temp 97.5°F | Resp 12 | Ht 66.0 in | Wt 143.1 lb

## 2023-07-14 DIAGNOSIS — C251 Malignant neoplasm of body of pancreas: Secondary | ICD-10-CM

## 2023-07-14 DIAGNOSIS — Z8507 Personal history of malignant neoplasm of pancreas: Secondary | ICD-10-CM | POA: Insufficient documentation

## 2023-07-14 DIAGNOSIS — Z85820 Personal history of malignant melanoma of skin: Secondary | ICD-10-CM | POA: Diagnosis not present

## 2023-07-14 DIAGNOSIS — Z9221 Personal history of antineoplastic chemotherapy: Secondary | ICD-10-CM | POA: Diagnosis not present

## 2023-07-14 DIAGNOSIS — I10 Essential (primary) hypertension: Secondary | ICD-10-CM | POA: Diagnosis not present

## 2023-07-14 NOTE — Progress Notes (Signed)
[facility] OFFICE PROGRESS NOTE   Diagnosis: Pancreas cancer  INTERVAL HISTORY:   David Jarvis returns as scheduled.  He feels well.  Good appetite.  He reports gastritis symptoms when he eats rapidly or a large amount.  No recent infection.  A CT at [facility] 05/28/2023 revealed no evidence of disease progression.  He is not scheduled for follow-up at [facility].  He is exercising.  He goes to the gym and cycles.  Objective:  Vital signs in last 24 hours:  Blood pressure 108/74, pulse 60, temperature (!) 97.5 F (36.4 C), temperature source Oral, resp. rate 12, height 5\' 6"  (1.676 m), weight 143 lb 1.6 oz (64.9 kg), SpO2 100%.    Lymphatics: No cervical, supraclavicular, or inguinal nodes.  "Shotty "left greater than right axillary nodes. Resp: Lungs clear bilaterally Cardio: Regular rate and rhythm GI: No hepatosplenomegaly, no mass, nontender Vascular: No leg edema     Lab Results:  Lab Results  Component Value Date   WBC 7.7 05/12/2022   HGB 13.6 05/12/2022   HCT 40.0 05/12/2022   MCV 83.9 05/12/2022   PLT 138 (L) 05/12/2022   NEUTROABS 6.7 05/12/2022    CMP  Lab Results  Component Value Date   NA 135 06/07/2022   K 4.1 06/07/2022   CL 99 06/07/2022   CO2 27 06/07/2022   GLUCOSE 186 (H) 06/07/2022   BUN 14 06/07/2022   CREATININE 1.30 (H) 06/07/2022   CALCIUM 9.1 06/07/2022   PROT 6.9 06/07/2022   ALBUMIN 4.2 06/07/2022   AST 63 (H) 06/07/2022   ALT 67 (H) 06/07/2022   ALKPHOS 369 (H) 06/07/2022   BILITOT 0.7 06/07/2022   GFRNONAA 59 (L) 06/07/2022    Lab Results  Component Value Date   CAN199 12 06/07/2022    Lab Results  Component Value Date   INR 1.1 12/02/2021   LABPROT 14.0 12/02/2021    Imaging:  No results found.  Medications: I have reviewed the patient's current medications.   Assessment/Plan: Pancreas cancer, adenocarcinoma MRI/MRCP abdomen 05/18/2021-diffuse biliary and pancreatic duct dilation, 2.8 cm mass in the pancreas  head, no evidence of metastatic disease ERCP 05/23/2021-localized stricture in the lower third of the main bile duct, stent placed EUS 05/23/2021-25 x 15 mm pancreas head mass with irregular borders, no lymphadenopathy, no vascular involvement, T2 N0 by ultrasound CT chest 06/01/2021-negative for metastatic disease CT abdomen pancreas protocol 06/01/2021-3.2 x 2.3 cm hypoenhancing pancreas head mass inseparable from the third portion of the duodenum, 1 cm left retroperitoneal lymph node, no vascular involvement Cycle 1 FOLFIRINOX 06/19/2021 Cycle 2 FOLFIRINOX 07/02/2021, irinotecan and 5-FU dose reduced, prophylactic dexamethasone added beginning day of pump discontinuation Cycle 3 FOLFIRINOX 07/23/2021, Udenyca CT abdomen/pelvis 08/01/2021-done in the emergency department to evaluate epigastric pain-pancreas head mass decreased in size.  Diffuse gaseous distention of large and small bowel.  No bowel obstruction or focal inflammation. 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 Truett Perna reviewed pathology report from the Whipple procedure with Dr. Kathrynn Ducking, adjuvant radiation not recommended. CT abdomen/pelvis at [facility] 03/10/2022-fat stranding  at the pancreaticojejunostomy CTs at [facility] 05/29/2022-no evidence of metastatic disease, nonspecific colitis of the sigmoid colon, unchanged mild intrahepatic biliary ductal dilatation CT pancreas protocol abdomen/pelvis at [facility] 01/06/2023-resolution of hepatic fluid collection, percutaneous biliary drains in position, no evidence of recurrent or metastatic disease in the abdomen or pelvis CT pancreas protocol abdomen/pelvis at [facility] 05/28/2023-wedge-shaped area of hypoattenuation in segment 7/8 less conspicuous, likely sequelae of percutaneous biliary drains no evidence of metastatic disease Obstructive jaundice secondary #1 Hypertension Admission in February 2022 with elevated liver enzymes MRI/MRCP-intra and extrahepatic biliary duct dilation, tapering smoothly to the ampulla, no mass or filling defect ERCP 10/06/2020-dilated main bile duct, biliary tree was swept and nothing was found, cytology from bile duct brushing-no malignant cells identified 5.  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  GERD 7.  Squamous cell carcinoma left forearm 2014 8.  Hyperlipidemia 9.  CT chest 08/01/2021, done in the emergency department to evaluate pleuritic chest pain-no PE.  Advanced three-vessel coronary vascular calcification. 10.  Fever of unknown origin beginning 08/14/2021-blood and urine cultures negative, no source for infection identified, placed on antibiotic prophylaxis beginning 08/17/2021, recurrent fever 10/09/2021-cultures negative, completed a course of Levaquin 11.  Fever/chills on 12/02/2021-urinalysis with clumps of white cells, culture positive for Staphylococcus epidermis and Enterococcus, completed a course of Augmentin Persistent fevers September 2023 ERCP 06/03/2022-stenotic bile duct orifice, biliary tract could not be accessed Multiple percutaneous biliary drain procedures at [facility] with placement of external/internal drains to manage a stricture at the hepaticojejunostomy, last procedure to upsize the  drains on 10/16/2022 02/07/2023-external biliary drains removed       Disposition: David Jarvis is in clinical remission from pancreas cancer.  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  He will return for an office visit 3 months.  We checked a CA 19-9 today.  Thornton Papas, MD  07/14/2023  9:02 AM

## 2023-07-15 ENCOUNTER — Telehealth: Payer: Self-pay

## 2023-07-15 DIAGNOSIS — H2513 Age-related nuclear cataract, bilateral: Secondary | ICD-10-CM | POA: Diagnosis not present

## 2023-07-15 DIAGNOSIS — H5213 Myopia, bilateral: Secondary | ICD-10-CM | POA: Diagnosis not present

## 2023-07-15 DIAGNOSIS — H31003 Unspecified chorioretinal scars, bilateral: Secondary | ICD-10-CM | POA: Diagnosis not present

## 2023-07-15 DIAGNOSIS — H52203 Unspecified astigmatism, bilateral: Secondary | ICD-10-CM | POA: Diagnosis not present

## 2023-07-15 LAB — CANCER ANTIGEN 19-9: CA 19-9: 9 U/mL (ref 0–35)

## 2023-07-15 NOTE — Telephone Encounter (Signed)
-----   Message from Thornton Papas sent at 07/15/2023  2:02 PM EST ----- Please call patient the CA 19-9 is normal, follow-up as scheduled

## 2023-07-15 NOTE — Telephone Encounter (Signed)
Patient gave verbal understanding and had no further questions or concern

## 2023-07-21 DIAGNOSIS — R69 Illness, unspecified: Secondary | ICD-10-CM | POA: Diagnosis not present

## 2023-07-23 NOTE — Telephone Encounter (Signed)
Telephone call  

## 2023-07-24 DIAGNOSIS — R69 Illness, unspecified: Secondary | ICD-10-CM | POA: Diagnosis not present

## 2023-07-28 DIAGNOSIS — R69 Illness, unspecified: Secondary | ICD-10-CM | POA: Diagnosis not present

## 2023-07-30 DIAGNOSIS — R69 Illness, unspecified: Secondary | ICD-10-CM | POA: Diagnosis not present

## 2023-08-04 DIAGNOSIS — R69 Illness, unspecified: Secondary | ICD-10-CM | POA: Diagnosis not present

## 2023-08-07 DIAGNOSIS — R69 Illness, unspecified: Secondary | ICD-10-CM | POA: Diagnosis not present

## 2023-08-11 DIAGNOSIS — R69 Illness, unspecified: Secondary | ICD-10-CM | POA: Diagnosis not present

## 2023-08-13 DIAGNOSIS — R69 Illness, unspecified: Secondary | ICD-10-CM | POA: Diagnosis not present

## 2023-08-18 DIAGNOSIS — R69 Illness, unspecified: Secondary | ICD-10-CM | POA: Diagnosis not present

## 2023-08-21 DIAGNOSIS — R69 Illness, unspecified: Secondary | ICD-10-CM | POA: Diagnosis not present

## 2023-08-25 DIAGNOSIS — R69 Illness, unspecified: Secondary | ICD-10-CM | POA: Diagnosis not present

## 2023-09-09 IMAGING — DX DG CHEST 2V
2 series · 2 of 2 positions shown · non-contrast
Comparison: 04/15/2017, CT chest 08/01/2021

CLINICAL DATA: Suspected sepsis

EXAM:
CHEST - 2 VIEW

[chest pa]
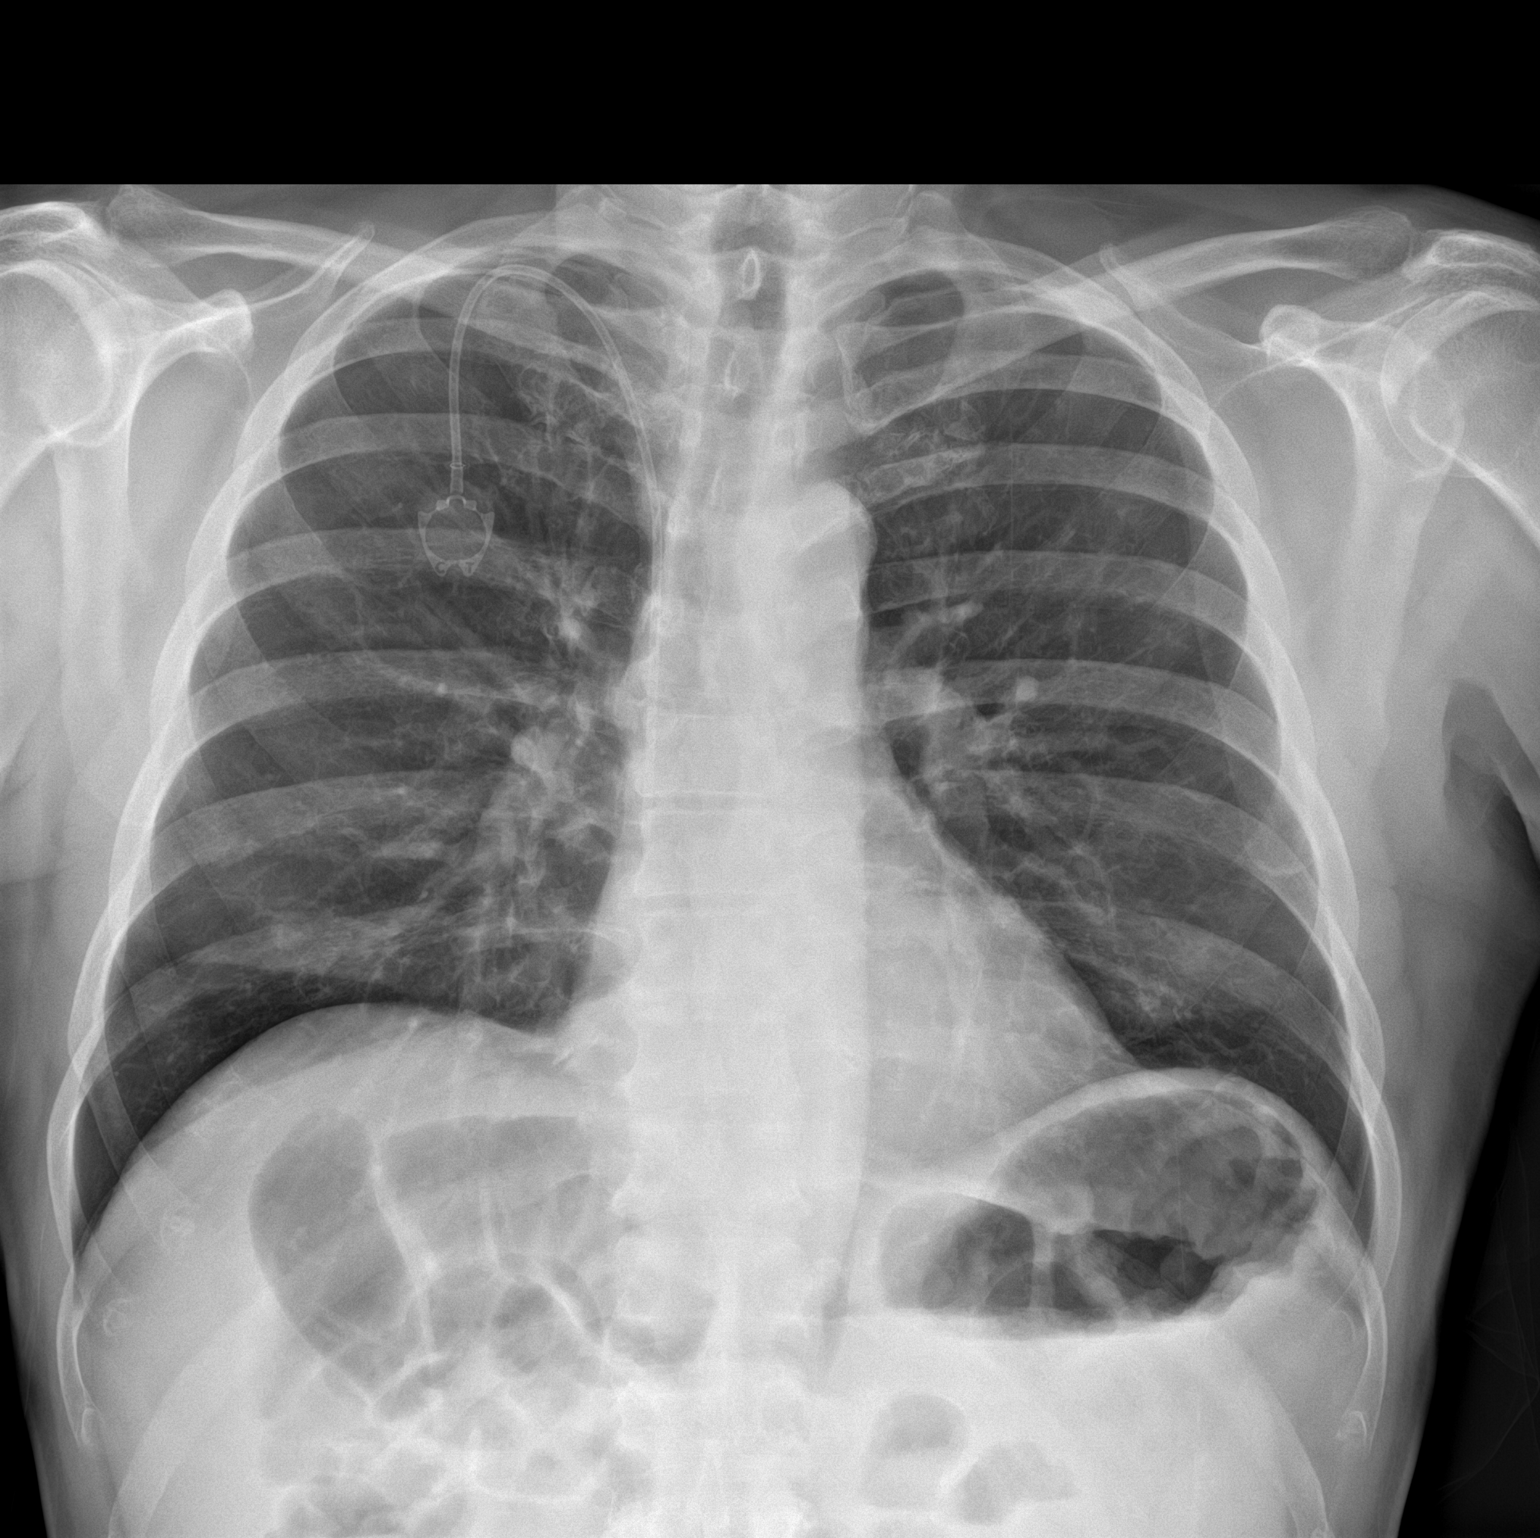

[chest lat]
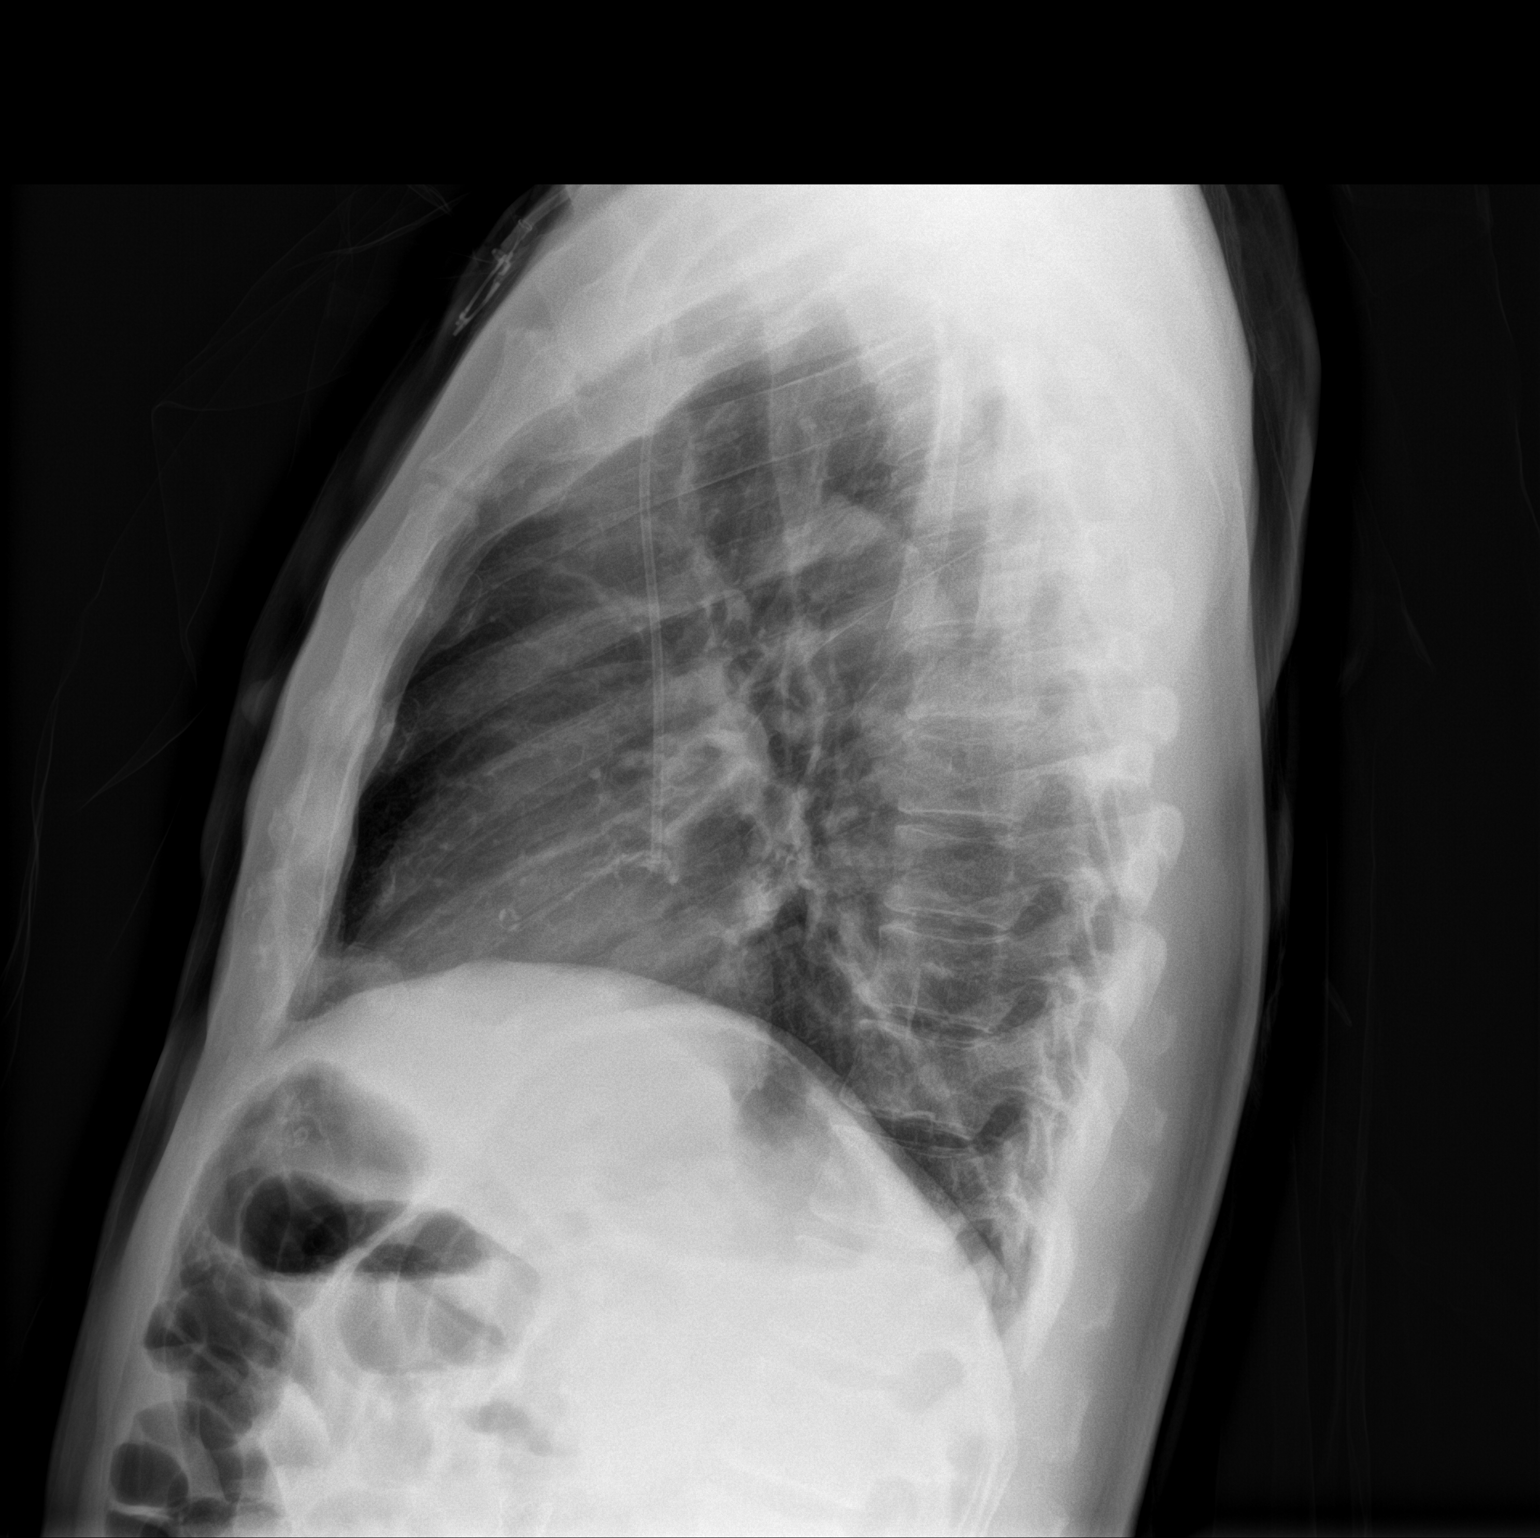

[2 of 2 positions shown; findings below may reference images not displayed]

FINDINGS: Right-sided central venous port tip over the cavoatrial region. No
focal opacity or pleural effusion. Normal cardiomediastinal
silhouette. No pneumothorax
IMPRESSION: No active cardiopulmonary disease.

## 2023-09-10 LAB — LAB REPORT - SCANNED: A1c: 5.2

## 2023-09-30 DIAGNOSIS — Z008 Encounter for other general examination: Secondary | ICD-10-CM | POA: Diagnosis not present

## 2023-09-30 DIAGNOSIS — N529 Male erectile dysfunction, unspecified: Secondary | ICD-10-CM | POA: Diagnosis not present

## 2023-09-30 DIAGNOSIS — Z833 Family history of diabetes mellitus: Secondary | ICD-10-CM | POA: Diagnosis not present

## 2023-09-30 DIAGNOSIS — Z8616 Personal history of COVID-19: Secondary | ICD-10-CM | POA: Diagnosis not present

## 2023-09-30 DIAGNOSIS — Z8507 Personal history of malignant neoplasm of pancreas: Secondary | ICD-10-CM | POA: Diagnosis not present

## 2023-09-30 DIAGNOSIS — N400 Enlarged prostate without lower urinary tract symptoms: Secondary | ICD-10-CM | POA: Diagnosis not present

## 2023-09-30 DIAGNOSIS — E039 Hypothyroidism, unspecified: Secondary | ICD-10-CM | POA: Diagnosis not present

## 2023-09-30 DIAGNOSIS — K219 Gastro-esophageal reflux disease without esophagitis: Secondary | ICD-10-CM | POA: Diagnosis not present

## 2023-09-30 DIAGNOSIS — Z8249 Family history of ischemic heart disease and other diseases of the circulatory system: Secondary | ICD-10-CM | POA: Diagnosis not present

## 2023-09-30 DIAGNOSIS — I10 Essential (primary) hypertension: Secondary | ICD-10-CM | POA: Diagnosis not present

## 2023-09-30 DIAGNOSIS — Z79890 Hormone replacement therapy (postmenopausal): Secondary | ICD-10-CM | POA: Diagnosis not present

## 2023-09-30 DIAGNOSIS — E785 Hyperlipidemia, unspecified: Secondary | ICD-10-CM | POA: Diagnosis not present

## 2023-09-30 DIAGNOSIS — Z823 Family history of stroke: Secondary | ICD-10-CM | POA: Diagnosis not present

## 2023-10-14 ENCOUNTER — Inpatient Hospital Stay: Payer: Medicare HMO | Attending: Oncology | Admitting: Oncology

## 2023-10-14 ENCOUNTER — Other Ambulatory Visit: Payer: Medicare HMO

## 2023-10-14 ENCOUNTER — Inpatient Hospital Stay: Payer: Medicare HMO

## 2023-10-14 VITALS — BP 115/69 | HR 100 | Temp 98.1°F | Resp 18 | Ht 66.0 in | Wt 146.5 lb

## 2023-10-14 DIAGNOSIS — C251 Malignant neoplasm of body of pancreas: Secondary | ICD-10-CM

## 2023-10-14 DIAGNOSIS — Z8507 Personal history of malignant neoplasm of pancreas: Secondary | ICD-10-CM | POA: Diagnosis not present

## 2023-10-14 NOTE — Progress Notes (Signed)
[facility] OFFICE PROGRESS NOTE   Diagnosis: Pancreas cancer  INTERVAL HISTORY:   [name] returns as scheduled.  He generally feels well.  Good appetite and energy level.  He is exercising.  He rides a bike and participates in exercise classes.  He has noted pain at the low anterior neck/upper chest with exertion.  This has been present for the past year.  No associated symptoms.  The pain resolves with rest, but returns when he goes back to exercising.  No fever or chills. He has a history of reflux and is taking pantoprazole.  He is currently wearing a heart monitor.  This was recommended as a routine test by his insurance company.  He does not have a cardiologist. Objective:  Vital signs in last 24 hours:  Blood pressure 115/69, pulse 100, temperature 98.1 F (36.7 C), temperature source Temporal, resp. rate 18, height 5\' 6"  (1.676 m), weight 146 lb 8 oz (66.5 kg), SpO2 100%.    Lymphatics: No cervical, supraclavicular, axillary, or inguinal nodes Resp: Lungs clear bilaterally Cardio: Regular rate and rhythm GI: No mass, nontender, no hepatosplenomegaly Vascular: No leg edema Musculoskeletal: No tenderness at the sternum or jugular notch.  No mass.   Lab Results:  Lab Results  Component Value Date   WBC 7.7 05/12/2022   HGB 13.6 05/12/2022   HCT 40.0 05/12/2022   MCV 83.9 05/12/2022   PLT 138 (L) 05/12/2022   NEUTROABS 6.7 05/12/2022    CMP  Lab Results  Component Value Date   NA 135 06/07/2022   K 4.1 06/07/2022   CL 99 06/07/2022   CO2 27 06/07/2022   GLUCOSE 186 (H) 06/07/2022   BUN 14 06/07/2022   CREATININE 1.30 (H) 06/07/2022   CALCIUM 9.1 06/07/2022   PROT 6.9 06/07/2022   ALBUMIN 4.2 06/07/2022   AST 63 (H) 06/07/2022   ALT 67 (H) 06/07/2022   ALKPHOS 369 (H) 06/07/2022   BILITOT 0.7 06/07/2022   GFRNONAA 59 (L) 06/07/2022    Lab Results  Component Value Date   CAN199 9 07/14/2023    Lab Results  Component Value Date    INR 1.1 12/02/2021   LABPROT 14.0 12/02/2021    Imaging:  No results found.  Medications: I have reviewed the patient's current medications.   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, stent placed EUS 05/23/2021-25 x 15 mm pancreas head mass with irregular borders, no lymphadenopathy, no vascular involvement, T2 N0 by ultrasound CT chest 06/01/2021-negative for metastatic disease CT abdomen pancreas protocol 06/01/2021-3.2 x 2.3 cm hypoenhancing pancreas head mass inseparable from the third portion of the duodenum, 1 cm left retroperitoneal lymph node, no vascular involvement Cycle 1 FOLFIRINOX 06/19/2021 Cycle 2 FOLFIRINOX 07/02/2021, irinotecan and 5-FU dose reduced, prophylactic dexamethasone added beginning day of pump discontinuation Cycle 3 FOLFIRINOX 07/23/2021, Udenyca CT abdomen/pelvis 08/01/2021-done in the emergency department to evaluate epigastric pain-pancreas head mass decreased in size.  Diffuse gaseous distention of large and small bowel.  No bowel obstruction or focal inflammation. 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 Truett Jarvis reviewed pathology report from the Whipple procedure with Dr. Kathrynn Jarvis, adjuvant radiation not recommended. CT abdomen/pelvis at [facility] 03/10/2022-fat stranding at the pancreaticojejunostomy CTs at [facility] 05/29/2022-no evidence of metastatic disease, nonspecific colitis of the sigmoid colon, unchanged mild intrahepatic biliary ductal dilatation CT pancreas protocol abdomen/pelvis at [facility] 01/06/2023-resolution of hepatic fluid collection, percutaneous biliary drains in position, no evidence of recurrent or metastatic disease in the abdomen or pelvis CT pancreas protocol abdomen/pelvis at [facility] 05/28/2023-wedge-shaped area of hypoattenuation in segment 7/8 less conspicuous, likely sequelae of percutaneous biliary drains no evidence of metastatic disease Obstructive jaundice secondary #1 Hypertension Admission in February 2022 with elevated liver enzymes MRI/MRCP-intra and extrahepatic biliary duct dilation, tapering smoothly to the ampulla, no mass or filling defect ERCP 10/06/2020-dilated main bile duct, biliary tree was swept and nothing was found, cytology from bile duct brushing-no malignant cells identified 5.  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  GERD 7.  Squamous cell carcinoma left forearm 2014 8.  Hyperlipidemia 9.  CT chest 08/01/2021, done in the emergency department to evaluate pleuritic chest pain-no PE.  Advanced three-vessel coronary vascular calcification. 10.  Fever of unknown origin beginning 08/14/2021-blood and urine cultures negative, no source for infection identified, placed on antibiotic prophylaxis beginning 08/17/2021, recurrent fever 10/09/2021-cultures negative, completed a course of Levaquin 11.  Fever/chills on 12/02/2021-urinalysis with clumps of white cells, culture positive for Staphylococcus epidermis and Enterococcus, completed a  course of Augmentin Persistent fevers September 2023 ERCP 06/03/2022-stenotic bile duct orifice, biliary tract could not be accessed Multiple percutaneous biliary drain procedures at [facility] with placement of external/internal drains to manage a stricture at the hepaticojejunostomy, last procedure to upsize the drains on 10/16/2022 02/07/2023-external biliary drains removed    Disposition: David Jarvis is in clinical remission from pancreas cancer.  We will follow-up on the CA 19-9 from today.  He will return for an office visit in 3 months.  He reports exertional upper chest/neck pain.  The pain may represent angina.  We will make a referral to cardiology.  David Papas, MD  10/14/2023  10:25 AM

## 2023-10-15 LAB — CANCER ANTIGEN 19-9: CA 19-9: 9 U/mL (ref 0–35)

## 2023-10-29 ENCOUNTER — Ambulatory Visit: Payer: Medicare HMO | Attending: Cardiovascular Disease | Admitting: Cardiovascular Disease

## 2023-10-29 ENCOUNTER — Encounter: Payer: Self-pay | Admitting: Cardiovascular Disease

## 2023-10-29 VITALS — BP 94/36 | HR 62 | Ht 66.0 in | Wt 142.8 lb

## 2023-10-29 DIAGNOSIS — Z01812 Encounter for preprocedural laboratory examination: Secondary | ICD-10-CM

## 2023-10-29 DIAGNOSIS — I10 Essential (primary) hypertension: Secondary | ICD-10-CM

## 2023-10-29 DIAGNOSIS — E785 Hyperlipidemia, unspecified: Secondary | ICD-10-CM | POA: Diagnosis not present

## 2023-10-29 DIAGNOSIS — I25118 Atherosclerotic heart disease of native coronary artery with other forms of angina pectoris: Secondary | ICD-10-CM

## 2023-10-29 DIAGNOSIS — C250 Malignant neoplasm of head of pancreas: Secondary | ICD-10-CM

## 2023-10-29 NOTE — Progress Notes (Unsigned)
 Cardiology Office Note:    Date:  10/30/2023   ID:  [name], DOB 25-Jan-1951, MRN [id number]  PCP:  Georgann Housekeeper, MD   [facility] Providers Cardiologist:  None     Referring MD: Ladene Artist, MD   Chief Complaint  Patient presents with   Consult  David Jarvis is a 73 y.o. male who is being seen today for the evaluation of exertional chest discomfort at the request of Ladene Artist, MD.   History of Present Illness:    David Jarvis is a 73 y.o. male with a hx of adenocarcinoma of the pancreas currently in remission following neoadjuvant chemotherapy and Whipple procedure (March 2023), followed by consolidation chemotherapy, history of hypercholesterolemia and hypertension, evidence of advanced three-vessel coronary artery calcification on chest CT referred for complaints of exertional angina.  Mr. Glinski exercises regularly and enjoys mountain biking.  He predictably gets chest discomfort when he bikes uphill on the initial portion of the [employer] [place] trail.  If he stops and rests the symptoms resolved within a couple of minutes.  He does not get angina when biking on level ground or in spin classes.  He enjoys exercising and is trying to build back muscle and weight following his long course of treatment for pancreatic cancer.  His chest discomfort is located in the upper half of the retrosternal area and the lower half of his throat and has a tension quality.  It never occurs at rest.  He does have a history of GERD and is wondering whether symptoms were related to this, but the discomfort only occurs with physical activity and is predictably relieved with rest.  It does not occur following meals or when he lies down.  He denies orthopnea, PND, palpitations, dizziness, syncope, claudication or any focal neurological complaints.  His brother had a myocardial infarction in his late 66s.  His father had a stroke at age 72 (he does not know if this was ischemic or  hemorrhagic).  His mother had a myocardial infarction at age 68.  He has never smoked.  His blood pressure is well-controlled on a very low-dose of valsartan.  He takes atorvastatin for hypercholesterolemia and has an excellent LDL at 43, but also a low HDL at 36.  He does not have diabetes mellitus (hemoglobin A1c 5.2% in January of this year).  Most recent creatinine levels at [facility] were 0.9 and 1.2 on labs performed in 2024.  He has had numerous nongated CT studies and indeed on a CT of the chest from 2022 there is marked calcification in all 3 major coronary artery distributions, as well as mild aortic atherosclerosis.  Tells me that his insurance company, [employer], perform some type of home evaluation and recommended wearing a heart rhythm monitor.  I do not have the results of this test.  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   right temple (MOHS)   BPV (benign positional vertigo)    Colon polyps    GERD (gastroesophageal reflux disease)    Glucose intolerance (impaired glucose tolerance)    History of nuclear stress test    ETT-Myoview 2/18: EF 57, no ischemia, low risk   Hyperlipidemia    Hypertension    Melanoma (HCC) 07/22/2008   RIGHT TEMPLE END STAGE LICHENORO REGRESSION MELANODERMA TX MOHS   Right knee pain    SCC (squamous  cell carcinoma) 02/24/2013   left inner forearm (Cx35FU)   Vitreous floaters of both eyes     Past Surgical History:  Procedure Laterality Date   ARTHROSCOPIC REPAIR ACL Right    BILIARY BRUSHING  10/06/2020   Procedure: BILIARY BRUSHING;  Surgeon: Kerin Salen, MD;  Location: [facility];  Service: Gastroenterology;;   BILIARY STENT PLACEMENT N/A 05/23/2021   Procedure: BILIARY STENT PLACEMENT;  Surgeon: Kerin Salen, MD;  Location: [facility];  Service: Gastroenterology;  Laterality: N/A;   cardiac stress test  08/2008    COLONOSCOPY W/ POLYPECTOMY  2006   ENDOSCOPIC RETROGRADE CHOLANGIOPANCREATOGRAPHY (ERCP) WITH PROPOFOL N/A 05/23/2021   Procedure: ENDOSCOPIC RETROGRADE CHOLANGIOPANCREATOGRAPHY (ERCP) WITH PROPOFOL;  Surgeon: Kerin Salen, MD;  Location: [facility];  Service: Gastroenterology;  Laterality: N/A;   ERCP N/A 10/06/2020   Procedure: ENDOSCOPIC RETROGRADE CHOLANGIOPANCREATOGRAPHY (ERCP);  Surgeon: Kerin Salen, MD;  Location: [facility];  Service: Gastroenterology;  Laterality: N/A;   EUS N/A 05/23/2021   Procedure: UPPER ENDOSCOPIC ULTRASOUND (EUS) LINEAR;  Surgeon: Willis Modena, MD;  Location: [facility];  Service: Endoscopy;  Laterality: N/A;   FINE NEEDLE ASPIRATION N/A 05/23/2021   Procedure: FINE NEEDLE ASPIRATION (FNA) LINEAR;  Surgeon: Willis Modena, MD;  Location: [facility];  Service: Endoscopy;  Laterality: N/A;   I & D EXTREMITY Left 02/03/2020   Procedure: IRRIGATION AND DEBRIDEMENT EXTREMITY;  Surgeon: Knute Neu, MD;  Location: [facility];  Service: Plastics;  Laterality: Left;   IR IMAGING GUIDED PORT INSERTION  06/11/2021   IR REMOVAL TUN ACCESS W/ PORT W/O FL MOD SED  04/03/2022   laser surgery of the eye     SPHINCTEROTOMY  10/06/2020   Procedure: SPHINCTEROTOMY;  Surgeon: Kerin Salen, MD;  Location: [facility];  Service: Gastroenterology;;  balloon sweep    Current Medications: Current Meds  Medication Sig   aspirin EC 81 MG tablet Take 81 mg by mouth daily.   atorvastatin (LIPITOR) 10 MG tablet Take 1 tablet by mouth daily   levothyroxine (SYNTHROID) 25 MCG tablet Take 1 tablet by mouth daily before breakfast   metoprolol succinate (TOPROL-XL) 25 MG 24 hr tablet Take 0.5 tablets (12.5 mg total) by mouth daily. Take with or immediately following a meal.   pantoprazole (PROTONIX) 40 MG tablet TAKE ONE TABLET BY MOUTH EVERY DAY 90 days   tamsulosin (FLOMAX) 0.4 MG CAPS capsule Take 1 capsule by mouth once daily   [DISCONTINUED] valsartan (DIOVAN) 80 MG tablet Take 80 mg by  mouth daily.     Allergies:   Ace inhibitors, Lisinopril, and Lisinopril-hydrochlorothiazide   Social History   Socioeconomic History   Marital status: Married    Spouse name: Not on file   Number of children: Not on file   Years of education: Not on file   Highest education level: Not on file  Occupational History   Not on file  Tobacco Use   Smoking status: Never   Smokeless tobacco: Never  Vaping Use   Vaping status: Never Used  Substance and Sexual Activity   Alcohol use: Not Currently    Comment: occasionally   Drug use: No   Sexual activity: Yes    Comment: married  Other Topics Concern   Not on file  Social History Narrative   Accountant   Married.  2 daughters.   Lived in [place] for many years.  Born in [place].   [employer]; [facility] [employer] [facility] [employer].   Social Drivers of Corporate investment banker Strain: Low Risk  (  08/21/2022)   Received from [facility], [facility]   Overall Financial Resource Strain (CARDIA)    Difficulty of Paying Living Expenses: Not hard at all  Food Insecurity: No Food Insecurity (08/21/2022)   [facility] from [facility], [facility]   Hunger Vital Sign    Worried About Running Out of Food in the Last Year: Never true    Ran Out of Food in the Last Year: Never true  Transportation Needs: No Transportation Needs (08/21/2022)   Received from [facility], [facility]   [facility] - Transportation    In the past 12 months, has lack of transportation kept you from medical appointments or from getting medications?: No    Lack of Transportation (Non-Medical): No  Physical Activity: Not on file  Stress: Not on file  Social Connections: Not on file     Family History: The patient's family history includes Breast cancer in his cousin; CAD in his brother and mother; CVA in his father and sister; Cancer in his paternal  aunt; Diabetes in his brother and mother; Heart attack (age of onset: 34) in his brother; Heart attack (age of onset: 65) in his mother. There is no history of Heart failure.  ROS:   Please see the history of present illness.     All other systems reviewed and are negative.  EKGs/Labs/Other Studies Reviewed:    The following studies were reviewed today:  EKG Interpretation Date/Time:  Wednesday October 29 2023 14:45:42 EST Ventricular Rate:  62 PR Interval:  160 QRS Duration:  86 QT Interval:  410 QTC Calculation: 416 R Axis:   30  Text Interpretation: Normal sinus rhythm Normal ECG When compared with ECG of 13-May-2022 02:42, No significant change since last tracing Confirmed by Heberto Sturdevant [id number]) on 10/29/2023 2:56:35 PM    Recent Labs: 10/29/2023: BUN 13; Creatinine, Ser 1.42; Hemoglobin 13.6; Platelets 133; Potassium 4.6; Sodium 138  Recent Lipid Panel No results found for: "CHOL", "TRIG", "HDL", "CHOLHDL", "VLDL", "LDLCALC", "LDLDIRECT" Outside labs reviewed 01/02/2023 Cholesterol 98, HDL 36, LDL 43, triglycerides 41 09/10/2023 Hemoglobin A1c 5.2%  Risk Assessment/Calculations:                Physical Exam:    VS:  BP (!) 94/36 (BP Location: Left Arm, Patient Position: Sitting, Cuff Size: Normal)   Pulse 62   Ht 5\' 6"  (1.676 m)   Wt 142 lb 12.8 oz (64.8 kg)   SpO2 99%   BMI 23.05 kg/m     Wt Readings from Last 3 Encounters:  10/29/23 142 lb 12.8 oz (64.8 kg)  10/14/23 146 lb 8 oz (66.5 kg)  07/14/23 143 lb 1.6 oz (64.9 kg)     GEN: Appears lean and very fit and appears younger than his stated age, well nourished, well developed in no acute distress HEENT: Normal NECK: No JVD; No carotid bruits LYMPHATICS: No lymphadenopathy CARDIAC: RRR, no murmurs, rubs, gallops RESPIRATORY:  Clear to auscultation without rales, wheezing or rhonchi  ABDOMEN: Soft, non-tender, non-distended MUSCULOSKELETAL:  No edema; No deformity  SKIN: Warm and dry NEUROLOGIC:   Alert and oriented x 3 PSYCHIATRIC:  Normal affect   ASSESSMENT:    1. Coronary artery disease of native artery of native heart with stable angina pectoris (HCC)   2. Essential hypertension   3. Dyslipidemia (high LDL; low HDL)   4. Primary cancer of head of pancreas (HCC)   5. Pre-procedure lab  exam    PLAN:    In order of problems listed above:  CAD: Chace describes typical stable/exertional angina pectoris with a relatively high threshold.  His blood pressure today is fairly low on treatment with valsartan which leaves [place] little room for antianginal therapy.  He also has a relatively slow heart rate in the 60s.  Will try to replace the valsartan with a very low-dose of metoprolol succinate 12.5 mg once daily.  We need to get an updated lipid profile, but his most recent labs suggested excellent reduction in LDL cholesterol and what is probably a chronically low HDL.   He is taking aspirin 81 mg daily and will continue doing so.  Discussed options for further evaluation with either coronary CT angiography or proceeding directly to cardiac catheterization.  Since he has relatively dense coronary calcifications I do have some concern that a coronary CTA may be nondiagnostic.  Cardiac catheterization, while invasive and with a high risk of complications, would also offer the opportunity for percutaneous revascularization, should this be the appropriate treatment.  We discussed the pros and cons of each approach and decided that he will go ahead with invasive cardiac catheterization and possible angioplasty stent which will be scheduled with Dr. Herbie Baltimore next week. HTN: Blood pressure surprisingly low today but he is asymptomatic.  At an office appointment with Dr. Truett Perna in the oncology clinic a couple of weeks ago his blood pressure was 115/69.  Stop valsartan.  Start low-dose metoprolol succinate. HLP: Suspect that his low HDL cholesterol is inherited, since he is very fit and physically active.   Continue atorvastatin.  Need to get an updated lipid profile.  Target LDL less than 70. History of pancreatic adenocarcinoma: Recovered remarkably well from Whipple procedure and chemotherapy.  At surgery 24 lymph nodes were sampled and were free of disease.  Multiple imaging studies have not shown any evidence of metastasis.  His oncologist is Dr. Myrle Sheng.      Informed Consent   Shared Decision Making/Informed Consent The risks [stroke (1 in 1000), death (1 in 1000), kidney failure [usually temporary] (1 in 500), bleeding (1 in 200), allergic reaction [possibly serious] (1 in 200)], benefits (diagnostic support and management of coronary artery disease) and alternatives of a cardiac catheterization were discussed in detail with Mr. Palka and he is willing to proceed.       Medication Adjustments/Labs and Tests Ordered: Current medicines are reviewed at length with the patient today.  Concerns regarding medicines are outlined above.  Orders Placed This Encounter  Procedures   CBC   Basic Metabolic Panel (BMET)   EKG 12-Lead   Meds ordered this encounter  Medications   metoprolol succinate (TOPROL-XL) 25 MG 24 hr tablet    Sig: Take 0.5 tablets (12.5 mg total) by mouth daily. Take with or immediately following a meal.    Dispense:  90 tablet    Refill:  3    Patient Instructions  Medication Instructions:  Stop valsartan Start metoprolol succinate 12.5 mg once daily half of a 25 mg tablet daily).   *If you need a refill on your cardiac medications before your next appointment, please call your pharmacy*   Lab Work: Basic Metabolic Panel  Complete Blood Count    If you have labs (blood work) drawn today and your tests are completely normal, you will receive your results only by: MyChart Message (if you have MyChart) OR A paper copy in the mail If you have any lab test that  is abnormal or we need to change your treatment, we will call you to review the  results.   Testing/Procedures:  [place] A DEPT OF [place]. [facility] AT [facility] [address] [place] [address] [place] [address] Dept: [phone] Loc: [phone]  VIRL COBLE  10/29/2023  You are scheduled for a Cardiac Catheterization on Friday, March 7 with Dr. Bryan Lemma.  1. Please arrive at the [facility] (Main Entrance A) at [facility]: [address] [place], [place] [address] at 7:00 AM (This time is 2 hour(s) before your procedure to ensure your preparation).   Free valet parking service is available. You will check in at ADMITTING. The support person will be asked to wait in the waiting room.  It is OK to have someone drop you off and come back when you are ready to be discharged.    Special note: Every effort is made to have your procedure done on time. Please understand that emergencies sometimes delay scheduled procedures.  2. Diet: Do not eat solid foods after midnight.  The patient may have clear liquids until 5am upon the day of the procedure.  3. Labs: You will need to have blood drawn on Wednesday, February 26 at [facility] [address], [place]  Open: 8am - 5pm (Lunch 12:30 - 1:30)   Phone: [phone]. You do not need to be fasting.  4. Medication instructions in preparation for your procedure:   Contrast Allergy: No   Current Outpatient Medications (Endocrine & Metabolic):    levothyroxine (SYNTHROID) 25 MCG tablet, Take 1 tablet by mouth daily before breakfast   Current Outpatient Medications (Cardiovascular):    atorvastatin (LIPITOR) 10 MG tablet, Take 1 tablet by mouth daily   valsartan (DIOVAN) 80 MG tablet, Take 80 mg by mouth daily.     Current Outpatient Medications (Analgesics):    aspirin EC 81 MG tablet, Take 81 mg by mouth daily.     Current Outpatient Medications (Other):    pantoprazole (PROTONIX) 40 MG tablet, TAKE ONE TABLET BY MOUTH EVERY  DAY 90 days   tamsulosin (FLOMAX) 0.4 MG CAPS capsule, Take 1 capsule by mouth once daily   Facility-Administered Medications Ordered in Other Visits (Other):    sodium chloride flush (NS) 0.9 % injection 10 mL No current facility-administered medications for this visit. *For reference purposes while preparing patient instructions.   Delete this med list prior to printing instructions for patient.*    On the morning of your procedure, take your Aspirin 81 mg and any morning medicines NOT listed above.  You may use sips of water.  5. Plan to go home the same day, you will only stay overnight if medically necessary. 6. Bring a current list of your medications and current insurance cards. 7. You MUST have a responsible person to drive you home. 8. Someone MUST be with you the first 24 hours after you arrive home or your discharge will be delayed. 9. Please wear clothes that are easy to get on and off and wear slip-on shoes.  Thank you for allowing [place] to care for you!   -- [facility]    Follow-Up: At [facility], you and your health needs are our priority.  As part of our continuing mission to provide you with exceptional heart care, we have created designated Provider Care Teams.  These Care Teams include your primary Cardiologist (physician) and Advanced Practice Providers (APPs -  Physician Assistants and Nurse Practitioners) who all work together to provide you with the care you need, when you need it.  We recommend signing up for the patient portal called "MyChart".  Sign up information is provided on this After Visit Summary.  MyChart is used to connect with patients for Virtual Visits (Telemedicine).  Patients are able to view lab/test results, encounter notes, upcoming appointments, etc.  Non-urgent messages can be sent to your provider as well.   To learn more about what you can do with MyChart, go to [email or web address].    Your next  appointment:   3 month(s)  The format for your next appointment:   In Person  Provider:   None    Other Instructions .ch   Signed, Thurmon Fair, MD  10/30/2023 10:15 AM    [facility]

## 2023-10-29 NOTE — H&P (View-Only) (Signed)
 Cardiology Office Note:    Date:  10/30/2023   ID:  David Jarvis, DOB 25-Jan-1951, MRN [id number]  PCP:  Georgann Housekeeper, MD   [facility] Providers Cardiologist:  None     Referring MD: Ladene Artist, MD   Chief Complaint  Patient presents with   Consult  David Jarvis is a 73 y.o. male who is being seen today for the evaluation of exertional chest discomfort at the request of Ladene Artist, MD.   History of Present Illness:    David Jarvis is a 73 y.o. male with a hx of adenocarcinoma of the pancreas currently in remission following neoadjuvant chemotherapy and Whipple procedure (March 2023), followed by consolidation chemotherapy, history of hypercholesterolemia and hypertension, evidence of advanced three-vessel coronary artery calcification on chest CT referred for complaints of exertional angina.  David Jarvis exercises regularly and enjoys mountain biking.  He predictably gets chest discomfort when he bikes uphill on the initial portion of the [employer] [place] trail.  If he stops and rests the symptoms resolved within a couple of minutes.  He does not get angina when biking on level ground or in spin classes.  He enjoys exercising and is trying to build back muscle and weight following his long course of treatment for pancreatic cancer.  His chest discomfort is located in the upper half of the retrosternal area and the lower half of his throat and has a tension quality.  It never occurs at rest.  He does have a history of GERD and is wondering whether symptoms were related to this, but the discomfort only occurs with physical activity and is predictably relieved with rest.  It does not occur following meals or when he lies down.  He denies orthopnea, PND, palpitations, dizziness, syncope, claudication or any focal neurological complaints.  His brother had a myocardial infarction in his late 66s.  His father had a stroke at age 72 (he does not know if this was ischemic or  hemorrhagic).  His mother had a myocardial infarction at age 68.  He has never smoked.  His blood pressure is well-controlled on a very low-dose of valsartan.  He takes atorvastatin for hypercholesterolemia and has an excellent LDL at 43, but also a low HDL at 36.  He does not have diabetes mellitus (hemoglobin A1c 5.2% in January of this year).  Most recent creatinine levels at [facility] were 0.9 and 1.2 on labs performed in 2024.  He has had numerous nongated CT studies and indeed on a CT of the chest from 2022 there is marked calcification in all 3 major coronary artery distributions, as well as mild aortic atherosclerosis.  Tells me that his insurance company, [employer], perform some type of home evaluation and recommended wearing a heart rhythm monitor.  I do not have the results of this test.  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   right temple (MOHS)   BPV (benign positional vertigo)    Colon polyps    GERD (gastroesophageal reflux disease)    Glucose intolerance (impaired glucose tolerance)    History of nuclear stress test    ETT-Myoview 2/18: EF 57, no ischemia, low risk   Hyperlipidemia    Hypertension    Melanoma (HCC) 07/22/2008   RIGHT TEMPLE END STAGE LICHENORO REGRESSION MELANODERMA TX MOHS   Right knee pain    SCC (squamous  cell carcinoma) 02/24/2013   left inner forearm (Cx35FU)   Vitreous floaters of both eyes     Past Surgical History:  Procedure Laterality Date   ARTHROSCOPIC REPAIR ACL Right    BILIARY BRUSHING  10/06/2020   Procedure: BILIARY BRUSHING;  Surgeon: Kerin Salen, MD;  Location: [facility];  Service: Gastroenterology;;   BILIARY STENT PLACEMENT N/A 05/23/2021   Procedure: BILIARY STENT PLACEMENT;  Surgeon: Kerin Salen, MD;  Location: [facility];  Service: Gastroenterology;  Laterality: N/A;   cardiac stress test  08/2008    COLONOSCOPY W/ POLYPECTOMY  2006   ENDOSCOPIC RETROGRADE CHOLANGIOPANCREATOGRAPHY (ERCP) WITH PROPOFOL N/A 05/23/2021   Procedure: ENDOSCOPIC RETROGRADE CHOLANGIOPANCREATOGRAPHY (ERCP) WITH PROPOFOL;  Surgeon: Kerin Salen, MD;  Location: [facility];  Service: Gastroenterology;  Laterality: N/A;   ERCP N/A 10/06/2020   Procedure: ENDOSCOPIC RETROGRADE CHOLANGIOPANCREATOGRAPHY (ERCP);  Surgeon: Kerin Salen, MD;  Location: [facility];  Service: Gastroenterology;  Laterality: N/A;   EUS N/A 05/23/2021   Procedure: UPPER ENDOSCOPIC ULTRASOUND (EUS) LINEAR;  Surgeon: Willis Modena, MD;  Location: [facility];  Service: Endoscopy;  Laterality: N/A;   FINE NEEDLE ASPIRATION N/A 05/23/2021   Procedure: FINE NEEDLE ASPIRATION (FNA) LINEAR;  Surgeon: Willis Modena, MD;  Location: [facility];  Service: Endoscopy;  Laterality: N/A;   I & D EXTREMITY Left 02/03/2020   Procedure: IRRIGATION AND DEBRIDEMENT EXTREMITY;  Surgeon: Knute Neu, MD;  Location: [facility];  Service: Plastics;  Laterality: Left;   IR IMAGING GUIDED PORT INSERTION  06/11/2021   IR REMOVAL TUN ACCESS W/ PORT W/O FL MOD SED  04/03/2022   laser surgery of the eye     SPHINCTEROTOMY  10/06/2020   Procedure: SPHINCTEROTOMY;  Surgeon: Kerin Salen, MD;  Location: [facility];  Service: Gastroenterology;;  balloon sweep    Current Medications: Current Meds  Medication Sig   aspirin EC 81 MG tablet Take 81 mg by mouth daily.   atorvastatin (LIPITOR) 10 MG tablet Take 1 tablet by mouth daily   levothyroxine (SYNTHROID) 25 MCG tablet Take 1 tablet by mouth daily before breakfast   metoprolol succinate (TOPROL-XL) 25 MG 24 hr tablet Take 0.5 tablets (12.5 mg total) by mouth daily. Take with or immediately following a meal.   pantoprazole (PROTONIX) 40 MG tablet TAKE ONE TABLET BY MOUTH EVERY DAY 90 days   tamsulosin (FLOMAX) 0.4 MG CAPS capsule Take 1 capsule by mouth once daily   [DISCONTINUED] valsartan (DIOVAN) 80 MG tablet Take 80 mg by  mouth daily.     Allergies:   Ace inhibitors, Lisinopril, and Lisinopril-hydrochlorothiazide   Social History   Socioeconomic History   Marital status: Married    Spouse name: Not on file   Number of children: Not on file   Years of education: Not on file   Highest education level: Not on file  Occupational History   Not on file  Tobacco Use   Smoking status: Never   Smokeless tobacco: Never  Vaping Use   Vaping status: Never Used  Substance and Sexual Activity   Alcohol use: Not Currently    Comment: occasionally   Drug use: No   Sexual activity: Yes    Comment: married  Other Topics Concern   Not on file  Social History Narrative   Accountant   Married.  2 daughters.   Lived in [place] for many years.  Born in [place].   [employer]; [facility] [employer].   Social Drivers of Corporate investment banker Strain: Low Risk  (  08/21/2022)   Received from [facility], [facility]   Overall Financial Resource Strain (CARDIA)    Difficulty of Paying Living Expenses: Not hard at all  Food Insecurity: No Food Insecurity (08/21/2022)   Received from [facility], [facility]   Hunger Vital Sign    Worried About Running Out of Food in the Last Year: Never true    Ran Out of Food in the Last Year: Never true  Transportation Needs: No Transportation Needs (08/21/2022)   Received from [facility], [facility]   [facility] - Transportation    In the past 12 months, has lack of transportation kept you from medical appointments or from getting medications?: No    Lack of Transportation (Non-Medical): No  Physical Activity: Not on file  Stress: Not on file  Social Connections: Not on file     Family History: The patient's family history includes Breast cancer in his cousin; CAD in his brother and mother; CVA in his father and sister; Cancer in his paternal  aunt; Diabetes in his brother and mother; Heart attack (age of onset: 34) in his brother; Heart attack (age of onset: 65) in his mother. There is no history of Heart failure.  ROS:   Please see the history of present illness.     All other systems reviewed and are negative.  EKGs/Labs/Other Studies Reviewed:    The following studies were reviewed today:  EKG Interpretation Date/Time:  Wednesday October 29 2023 14:45:42 EST Ventricular Rate:  62 PR Interval:  160 QRS Duration:  86 QT Interval:  410 QTC Calculation: 416 R Axis:   30  Text Interpretation: Normal sinus rhythm Normal ECG When compared with ECG of 13-May-2022 02:42, No significant change since last tracing Confirmed by Heberto Sturdevant [id number]) on 10/29/2023 2:56:35 PM    Recent Labs: 10/29/2023: BUN 13; Creatinine, Ser 1.42; Hemoglobin 13.6; Platelets 133; Potassium 4.6; Sodium 138  Recent Lipid Panel No results found for: "CHOL", "TRIG", "HDL", "CHOLHDL", "VLDL", "LDLCALC", "LDLDIRECT" Outside labs reviewed 01/02/2023 Cholesterol 98, HDL 36, LDL 43, triglycerides 41 09/10/2023 Hemoglobin A1c 5.2%  Risk Assessment/Calculations:                Physical Exam:    VS:  BP (!) 94/36 (BP Location: Left Arm, Patient Position: Sitting, Cuff Size: Normal)   Pulse 62   Ht 5\' 6"  (1.676 m)   Wt 142 lb 12.8 oz (64.8 kg)   SpO2 99%   BMI 23.05 kg/m     Wt Readings from Last 3 Encounters:  10/29/23 142 lb 12.8 oz (64.8 kg)  10/14/23 146 lb 8 oz (66.5 kg)  07/14/23 143 lb 1.6 oz (64.9 kg)     GEN: Appears lean and very fit and appears younger than his stated age, well nourished, well developed in no acute distress HEENT: Normal NECK: No JVD; No carotid bruits LYMPHATICS: No lymphadenopathy CARDIAC: RRR, no murmurs, rubs, gallops RESPIRATORY:  Clear to auscultation without rales, wheezing or rhonchi  ABDOMEN: Soft, non-tender, non-distended MUSCULOSKELETAL:  No edema; No deformity  SKIN: Warm and dry NEUROLOGIC:   Alert and oriented x 3 PSYCHIATRIC:  Normal affect   ASSESSMENT:    1. Coronary artery disease of native artery of native heart with stable angina pectoris (HCC)   2. Essential hypertension   3. Dyslipidemia (high LDL; low HDL)   4. Primary cancer of head of pancreas (HCC)   5. Pre-procedure lab  exam    PLAN:    In order of problems listed above:  CAD: Chace describes typical stable/exertional angina pectoris with a relatively high threshold.  His blood pressure today is fairly low on treatment with valsartan which leaves [place] little room for antianginal therapy.  He also has a relatively slow heart rate in the 60s.  Will try to replace the valsartan with a very low-dose of metoprolol succinate 12.5 mg once daily.  We need to get an updated lipid profile, but his most recent labs suggested excellent reduction in LDL cholesterol and what is probably a chronically low HDL.   He is taking aspirin 81 mg daily and will continue doing so.  Discussed options for further evaluation with either coronary CT angiography or proceeding directly to cardiac catheterization.  Since he has relatively dense coronary calcifications I do have some concern that a coronary CTA may be nondiagnostic.  Cardiac catheterization, while invasive and with a high risk of complications, would also offer the opportunity for percutaneous revascularization, should this be the appropriate treatment.  We discussed the pros and cons of each approach and decided that he will go ahead with invasive cardiac catheterization and possible angioplasty stent which will be scheduled with Dr. Herbie Baltimore next week. HTN: Blood pressure surprisingly low today but he is asymptomatic.  At an office appointment with Dr. Truett Perna in the oncology clinic a couple of weeks ago his blood pressure was 115/69.  Stop valsartan.  Start low-dose metoprolol succinate. HLP: Suspect that his low HDL cholesterol is inherited, since he is very fit and physically active.   Continue atorvastatin.  Need to get an updated lipid profile.  Target LDL less than 70. History of pancreatic adenocarcinoma: Recovered remarkably well from Whipple procedure and chemotherapy.  At surgery 24 lymph nodes were sampled and were free of disease.  Multiple imaging studies have not shown any evidence of metastasis.  His oncologist is Dr. Myrle Sheng.      Informed Consent   Shared Decision Making/Informed Consent The risks [stroke (1 in 1000), death (1 in 1000), kidney failure [usually temporary] (1 in 500), bleeding (1 in 200), allergic reaction [possibly serious] (1 in 200)], benefits (diagnostic support and management of coronary artery disease) and alternatives of a cardiac catheterization were discussed in detail with David Jarvis and he is willing to proceed.       Medication Adjustments/Labs and Tests Ordered: Current medicines are reviewed at length with the patient today.  Concerns regarding medicines are outlined above.  Orders Placed This Encounter  Procedures   CBC   Basic Metabolic Panel (BMET)   EKG 12-Lead   Meds ordered this encounter  Medications   metoprolol succinate (TOPROL-XL) 25 MG 24 hr tablet    Sig: Take 0.5 tablets (12.5 mg total) by mouth daily. Take with or immediately following a meal.    Dispense:  90 tablet    Refill:  3    Patient Instructions  Medication Instructions:  Stop valsartan Start metoprolol succinate 12.5 mg once daily half of a 25 mg tablet daily).   *If you need a refill on your cardiac medications before your next appointment, please call your pharmacy*   Lab Work: Basic Metabolic Panel  Complete Blood Count    If you have labs (blood work) drawn today and your tests are completely normal, you will receive your results only by: MyChart Message (if you have MyChart) OR A paper copy in the mail If you have any lab test that  is abnormal or we need to change your treatment, we will call you to review the  results.   Testing/Procedures:  [place] A DEPT OF [place]. [facility] AT [facility] [address] [place] [address] [place] [address] Dept: [phone] Loc: [phone]  VIRL COBLE  10/29/2023  You are scheduled for a Cardiac Catheterization on Friday, March 7 with Dr. Bryan Lemma.  1. Please arrive at the [facility] (Main Entrance A) at [facility]: [address] [place], [place] [address] at 7:00 AM (This time is 2 hour(s) before your procedure to ensure your preparation).   Free valet parking service is available. You will check in at ADMITTING. The support person will be asked to wait in the waiting room.  It is OK to have someone drop you off and come back when you are ready to be discharged.    Special note: Every effort is made to have your procedure done on time. Please understand that emergencies sometimes delay scheduled procedures.  2. Diet: Do not eat solid foods after midnight.  The patient may have clear liquids until 5am upon the day of the procedure.  3. Labs: You will need to have blood drawn on Wednesday, February 26 at [facility] [address] [facility] [address], [place]  Open: 8am - 5pm (Lunch 12:30 - 1:30)   Phone: [phone]. You do not need to be fasting.  4. Medication instructions in preparation for your procedure:   Contrast Allergy: No   Current Outpatient Medications (Endocrine & Metabolic):    levothyroxine (SYNTHROID) 25 MCG tablet, Take 1 tablet by mouth daily before breakfast   Current Outpatient Medications (Cardiovascular):    atorvastatin (LIPITOR) 10 MG tablet, Take 1 tablet by mouth daily   valsartan (DIOVAN) 80 MG tablet, Take 80 mg by mouth daily.     Current Outpatient Medications (Analgesics):    aspirin EC 81 MG tablet, Take 81 mg by mouth daily.     Current Outpatient Medications (Other):    pantoprazole (PROTONIX) 40 MG tablet, TAKE ONE TABLET BY MOUTH EVERY  DAY 90 days   tamsulosin (FLOMAX) 0.4 MG CAPS capsule, Take 1 capsule by mouth once daily   Facility-Administered Medications Ordered in Other Visits (Other):    sodium chloride flush (NS) 0.9 % injection 10 mL No current facility-administered medications for this visit. *For reference purposes while preparing patient instructions.   Delete this med list prior to printing instructions for patient.*    On the morning of your procedure, take your Aspirin 81 mg and any morning medicines NOT listed above.  You may use sips of water.  5. Plan to go home the same day, you will only stay overnight if medically necessary. 6. Bring a current list of your medications and current insurance cards. 7. You MUST have a responsible person to drive you home. 8. Someone MUST be with you the first 24 hours after you arrive home or your discharge will be delayed. 9. Please wear clothes that are easy to get on and off and wear slip-on shoes.  Thank you for allowing [place] to care for you!   -- [place] [facility]    Follow-Up: At [facility], you and your health needs are our priority.  As part of our continuing mission to provide you with exceptional heart care, we have created designated Provider Care Teams.  These Care Teams include your primary Cardiologist (physician) and Advanced Practice Providers (APPs -  Physician Assistants and Nurse Practitioners) who all work together to provide you with the care you need, when you need it.  We recommend signing up for the patient portal called "MyChart".  Sign up information is provided on this After Visit Summary.  MyChart is used to connect with patients for Virtual Visits (Telemedicine).  Patients are able to view lab/test results, encounter notes, upcoming appointments, etc.  Non-urgent messages can be sent to your provider as well.   To learn more about what you can do with MyChart, go to [email or web address].    Your next  appointment:   3 month(s)  The format for your next appointment:   In Person  Provider:   None    Other Instructions .ch   Signed, Thurmon Fair, MD  10/30/2023 10:15 AM    [facility]

## 2023-10-29 NOTE — Patient Instructions (Addendum)
 Medication Instructions:  Your physician recommends that you continue on your current medications as directed. Please refer to the Current Medication list given to you today.    *If you need a refill on your cardiac medications before your next appointment, please call your pharmacy*   Lab Work: Basic Metabolic Panel  Complete Blood Count    If you have labs (blood work) drawn today and your tests are completely normal, you will receive your results only by: MyChart Message (if you have MyChart) OR A paper copy in the mail If you have any lab test that is abnormal or we need to change your treatment, we will call you to review the results.   Testing/Procedures:  [place] A DEPT OF [place]. [facility] AT [facility] [address] [place] [address] [place] [address] Dept: [phone] Loc: [phone]  David Jarvis  10/29/2023  You are scheduled for a Cardiac Catheterization on Friday, March 7 with Dr. Bryan Lemma.  1. Please arrive at the [facility] (Main Entrance A) at [facility]: [address] [place], [place] [address] at 7:00 AM (This time is 2 hour(s) before your procedure to ensure your preparation).   Free valet parking service is available. You will check in at ADMITTING. The support person will be asked to wait in the waiting room.  It is OK to have someone drop you off and come back when you are ready to be discharged.    Special note: Every effort is made to have your procedure done on time. Please understand that emergencies sometimes delay scheduled procedures.  2. Diet: Do not eat solid foods after midnight.  The patient may have clear liquids until 5am upon the day of the procedure.  3. Labs: You will need to have blood drawn on Wednesday, February 26 at [facility] [address] [facility] [address], [place]  Open: 8am - 5pm (Lunch 12:30 - 1:30)   Phone: [phone]. You do not need to be fasting.  4.  Medication instructions in preparation for your procedure:   Contrast Allergy: No   Current Outpatient Medications (Endocrine & Metabolic):    levothyroxine (SYNTHROID) 25 MCG tablet, Take 1 tablet by mouth daily before breakfast   Current Outpatient Medications (Cardiovascular):    atorvastatin (LIPITOR) 10 MG tablet, Take 1 tablet by mouth daily   valsartan (DIOVAN) 80 MG tablet, Take 80 mg by mouth daily.     Current Outpatient Medications (Analgesics):    aspirin EC 81 MG tablet, Take 81 mg by mouth daily.     Current Outpatient Medications (Other):    pantoprazole (PROTONIX) 40 MG tablet, TAKE ONE TABLET BY MOUTH EVERY DAY 90 days   tamsulosin (FLOMAX) 0.4 MG CAPS capsule, Take 1 capsule by mouth once daily   Facility-Administered Medications Ordered in Other Visits (Other):    sodium chloride flush (NS) 0.9 % injection 10 mL No current facility-administered medications for this visit. *For reference purposes while preparing patient instructions.   Delete this med list prior to printing instructions for patient.*   Stop taking, Diovan (Valsartan) Wednesday, March 5,   On the morning of your procedure, take your Aspirin 81 mg and any morning medicines NOT listed above.  You may use sips of water.  5. Plan to go home the same day, you will only stay overnight if medically necessary. 6. Bring a current list of your medications and current insurance cards. 7. You MUST have a responsible person  to drive you home. 8. Someone MUST be with you the first 24 hours after you arrive home or your discharge will be delayed. 9. Please wear clothes that are easy to get on and off and wear slip-on shoes.  Thank you for allowing [place] to care for you!   -- [place] [facility]    Follow-Up: At [facility], you and your health needs are our priority.  As part of our continuing mission to provide you with exceptional heart care, we have created designated  Provider Care Teams.  These Care Teams include your primary Cardiologist (physician) and Advanced Practice Providers (APPs -  Physician Assistants and Nurse Practitioners) who all work together to provide you with the care you need, when you need it.  We recommend signing up for the patient portal called "MyChart".  Sign up information is provided on this After Visit Summary.  MyChart is used to connect with patients for Virtual Visits (Telemedicine).  Patients are able to view lab/test results, encounter notes, upcoming appointments, etc.  Non-urgent messages can be sent to your provider as well.   To learn more about what you can do with MyChart, go to [email or web address].    Your next appointment:   3 month(s)  The format for your next appointment:   In Person  Provider:   None    Other Instructions .ch

## 2023-10-30 ENCOUNTER — Encounter: Payer: Self-pay | Admitting: Cardiovascular Disease

## 2023-10-30 DIAGNOSIS — I25118 Atherosclerotic heart disease of native coronary artery with other forms of angina pectoris: Secondary | ICD-10-CM | POA: Insufficient documentation

## 2023-10-30 LAB — CBC
Hematocrit: 39.8 % (ref 37.5–51.0)
Hemoglobin: 13.6 g/dL (ref 13.0–17.7)
MCH: 31.3 pg (ref 26.6–33.0)
MCHC: 34.2 g/dL (ref 31.5–35.7)
MCV: 92 fL (ref 79–97)
Platelets: 133 10*3/uL — ABNORMAL LOW (ref 150–450)
RBC: 4.34 x10E6/uL (ref 4.14–5.80)
RDW: 13 % (ref 11.6–15.4)
WBC: 3.9 10*3/uL (ref 3.4–10.8)

## 2023-10-30 LAB — BASIC METABOLIC PANEL
BUN/Creatinine Ratio: 9 — ABNORMAL LOW (ref 10–24)
BUN: 13 mg/dL (ref 8–27)
CO2: 23 mmol/L (ref 20–29)
Calcium: 8.7 mg/dL (ref 8.6–10.2)
Chloride: 103 mmol/L (ref 96–106)
Creatinine, Ser: 1.42 mg/dL — ABNORMAL HIGH (ref 0.76–1.27)
Glucose: 137 mg/dL — ABNORMAL HIGH (ref 70–99)
Potassium: 4.6 mmol/L (ref 3.5–5.2)
Sodium: 138 mmol/L (ref 134–144)
eGFR: 53 mL/min/{1.73_m2} — ABNORMAL LOW (ref >59)

## 2023-10-30 MED ORDER — METOPROLOL SUCCINATE ER 25 MG PO TB24: 12.5000 mg | ORAL_TABLET | Freq: Every day | ORAL | 3 refills | Status: DC

## 2023-11-03 ENCOUNTER — Telehealth: Payer: Self-pay

## 2023-11-03 NOTE — Telephone Encounter (Signed)
 Corinda, We can actually do it tomorrow with Dr. Excell Seltzer if OK with the patient. Could you please re-schedule it if he agrees? I will put orders in after I hear from you. Thanks

## 2023-11-03 NOTE — Telephone Encounter (Signed)
 Called and spoke with patient at the request of Dr Royann Shivers. The patient states that his plans have changed and he would be agreeable to having his cardiac cath performed tomorrow instead this Friday.  Called the cath lab and spoke with Clydie Braun. New time for cath is tomorrow at 9am with Tonny Bollman MD. Called patient and informed him that change has been completed and his new check in time for tomorrow is 7am. Please follow previous pre cath instructions that was given at last office visit. Patient voiced understanding. Dr Royann Shivers has been notified of new plan.

## 2023-11-04 ENCOUNTER — Other Ambulatory Visit: Payer: Self-pay

## 2023-11-04 ENCOUNTER — Encounter (HOSPITAL_COMMUNITY)
Admission: AD | Disposition: A | Discharge status: Home / Self Care | Payer: Self-pay | Source: Home / Self Care | Attending: Cardiovascular Disease

## 2023-11-04 ENCOUNTER — Encounter (HOSPITAL_COMMUNITY): Payer: Self-pay | Admitting: Cardiovascular Disease

## 2023-11-04 ENCOUNTER — Inpatient Hospital Stay (HOSPITAL_COMMUNITY)

## 2023-11-04 ENCOUNTER — Inpatient Hospital Stay (HOSPITAL_COMMUNITY)
Admission: AD | Admit: 2023-11-04 | Discharge: 2023-11-10 | DRG: 234 | Disposition: A | Discharge status: Home / Self Care | Payer: Medicare HMO | Attending: Cardiovascular Disease | Admitting: Cardiovascular Disease

## 2023-11-04 DIAGNOSIS — Z888 Allergy status to other drugs, medicaments and biological substances status: Secondary | ICD-10-CM | POA: Diagnosis not present

## 2023-11-04 DIAGNOSIS — Z79890 Hormone replacement therapy (postmenopausal): Secondary | ICD-10-CM

## 2023-11-04 DIAGNOSIS — N400 Enlarged prostate without lower urinary tract symptoms: Secondary | ICD-10-CM | POA: Diagnosis not present

## 2023-11-04 DIAGNOSIS — D62 Acute posthemorrhagic anemia: Secondary | ICD-10-CM | POA: Diagnosis not present

## 2023-11-04 DIAGNOSIS — E7800 Pure hypercholesterolemia, unspecified: Secondary | ICD-10-CM | POA: Diagnosis present

## 2023-11-04 DIAGNOSIS — Z8249 Family history of ischemic heart disease and other diseases of the circulatory system: Secondary | ICD-10-CM | POA: Diagnosis not present

## 2023-11-04 DIAGNOSIS — Z4682 Encounter for fitting and adjustment of non-vascular catheter: Secondary | ICD-10-CM | POA: Diagnosis not present

## 2023-11-04 DIAGNOSIS — J9811 Atelectasis: Secondary | ICD-10-CM | POA: Diagnosis not present

## 2023-11-04 DIAGNOSIS — Z90411 Acquired partial absence of pancreas: Secondary | ICD-10-CM

## 2023-11-04 DIAGNOSIS — Z803 Family history of malignant neoplasm of breast: Secondary | ICD-10-CM

## 2023-11-04 DIAGNOSIS — Z8507 Personal history of malignant neoplasm of pancreas: Secondary | ICD-10-CM

## 2023-11-04 DIAGNOSIS — R001 Bradycardia, unspecified: Secondary | ICD-10-CM | POA: Diagnosis not present

## 2023-11-04 DIAGNOSIS — D6959 Other secondary thrombocytopenia: Secondary | ICD-10-CM | POA: Diagnosis not present

## 2023-11-04 DIAGNOSIS — Z79899 Other long term (current) drug therapy: Secondary | ICD-10-CM | POA: Diagnosis not present

## 2023-11-04 DIAGNOSIS — I25110 Atherosclerotic heart disease of native coronary artery with unstable angina pectoris: Secondary | ICD-10-CM | POA: Diagnosis not present

## 2023-11-04 DIAGNOSIS — R0989 Other specified symptoms and signs involving the circulatory and respiratory systems: Secondary | ICD-10-CM | POA: Diagnosis not present

## 2023-11-04 DIAGNOSIS — I10 Essential (primary) hypertension: Secondary | ICD-10-CM | POA: Diagnosis present

## 2023-11-04 DIAGNOSIS — I700 Atherosclerosis of aorta: Secondary | ICD-10-CM | POA: Diagnosis not present

## 2023-11-04 DIAGNOSIS — Z9221 Personal history of antineoplastic chemotherapy: Secondary | ICD-10-CM | POA: Diagnosis not present

## 2023-11-04 DIAGNOSIS — Z01810 Encounter for preprocedural cardiovascular examination: Secondary | ICD-10-CM | POA: Diagnosis not present

## 2023-11-04 DIAGNOSIS — Z951 Presence of aortocoronary bypass graft: Secondary | ICD-10-CM

## 2023-11-04 DIAGNOSIS — Z860100 Personal history of colon polyps, unspecified: Secondary | ICD-10-CM

## 2023-11-04 DIAGNOSIS — Z7982 Long term (current) use of aspirin: Secondary | ICD-10-CM | POA: Diagnosis not present

## 2023-11-04 DIAGNOSIS — Z823 Family history of stroke: Secondary | ICD-10-CM

## 2023-11-04 DIAGNOSIS — R918 Other nonspecific abnormal finding of lung field: Secondary | ICD-10-CM | POA: Diagnosis not present

## 2023-11-04 DIAGNOSIS — K219 Gastro-esophageal reflux disease without esophagitis: Secondary | ICD-10-CM | POA: Diagnosis not present

## 2023-11-04 DIAGNOSIS — Z833 Family history of diabetes mellitus: Secondary | ICD-10-CM | POA: Diagnosis not present

## 2023-11-04 DIAGNOSIS — Z85828 Personal history of other malignant neoplasm of skin: Secondary | ICD-10-CM

## 2023-11-04 DIAGNOSIS — I2510 Atherosclerotic heart disease of native coronary artery without angina pectoris: Secondary | ICD-10-CM | POA: Diagnosis not present

## 2023-11-04 DIAGNOSIS — Z85820 Personal history of malignant melanoma of skin: Secondary | ICD-10-CM

## 2023-11-04 DIAGNOSIS — I119 Hypertensive heart disease without heart failure: Secondary | ICD-10-CM | POA: Diagnosis not present

## 2023-11-04 DIAGNOSIS — E039 Hypothyroidism, unspecified: Secondary | ICD-10-CM | POA: Diagnosis not present

## 2023-11-04 DIAGNOSIS — K5900 Constipation, unspecified: Secondary | ICD-10-CM | POA: Diagnosis not present

## 2023-11-04 DIAGNOSIS — Z48812 Encounter for surgical aftercare following surgery on the circulatory system: Secondary | ICD-10-CM | POA: Diagnosis not present

## 2023-11-04 DIAGNOSIS — J90 Pleural effusion, not elsewhere classified: Secondary | ICD-10-CM | POA: Diagnosis not present

## 2023-11-04 HISTORY — PX: LEFT HEART CATH AND CORONARY ANGIOGRAPHY: CATH118249

## 2023-11-04 LAB — ECHOCARDIOGRAM COMPLETE
AR max vel: 3.41 cm2
AV Area VTI: 2.76 cm2
AV Area mean vel: 2.47 cm2
AV Mean grad: 4 mmHg
AV Peak grad: 6.9 mmHg
Ao pk vel: 1.31 m/s
Area-P 1/2: 3.08 cm2
Height: 66 in
S' Lateral: 3.1 cm
Weight: 2240 [oz_av]

## 2023-11-04 MED ORDER — LABETALOL HCL 5 MG/ML IV SOLN: 10.0000 mg | INTRAVENOUS | Status: AC | PRN

## 2023-11-04 MED ORDER — FENTANYL CITRATE (PF) 100 MCG/2ML IJ SOLN
INTRAMUSCULAR | Status: AC
  Filled 2023-11-04: qty 2

## 2023-11-04 MED ORDER — HEPARIN (PORCINE) IN NACL 1000-0.9 UT/500ML-% IV SOLN
INTRAVENOUS | Status: DC | PRN
  Administered 2023-11-04 (×2): 500 mL

## 2023-11-04 MED ORDER — ASPIRIN 81 MG PO CHEW: 81.0000 mg | CHEWABLE_TABLET | ORAL | Status: DC

## 2023-11-04 MED ORDER — LIDOCAINE HCL (PF) 1 % IJ SOLN
INTRAMUSCULAR | Status: DC | PRN
Start: 2023-11-04 — End: 2023-11-04
  Administered 2023-11-04: 2 mL via INTRADERMAL

## 2023-11-04 MED ORDER — PANTOPRAZOLE SODIUM 40 MG PO TBEC
40.0000 mg | DELAYED_RELEASE_TABLET | Freq: Every day | ORAL | Status: DC
  Administered 2023-11-05: 40 mg via ORAL
  Filled 2023-11-04: qty 1

## 2023-11-04 MED ORDER — SODIUM CHLORIDE 0.9 % IV SOLN
INTRAVENOUS | Status: AC
Start: 2023-11-04 — End: 2023-11-04

## 2023-11-04 MED ORDER — HEPARIN SODIUM (PORCINE) 1000 UNIT/ML IJ SOLN
INTRAMUSCULAR | Status: AC
  Filled 2023-11-04: qty 10

## 2023-11-04 MED ORDER — ACETAMINOPHEN 325 MG PO TABS: 650.0000 mg | ORAL_TABLET | ORAL | Status: DC | PRN

## 2023-11-04 MED ORDER — FENTANYL CITRATE (PF) 100 MCG/2ML IJ SOLN
INTRAMUSCULAR | Status: DC | PRN
  Administered 2023-11-04: 25 ug via INTRAVENOUS

## 2023-11-04 MED ORDER — LEVOTHYROXINE SODIUM 25 MCG PO TABS
25.0000 ug | ORAL_TABLET | Freq: Every day | ORAL | Status: DC
  Administered 2023-11-05 – 2023-11-10 (×6): 25 ug via ORAL
  Filled 2023-11-04 (×6): qty 1

## 2023-11-04 MED ORDER — PERFLUTREN LIPID MICROSPHERE
1.0000 mL | INTRAVENOUS | Status: AC | PRN
  Administered 2023-11-04: 3 mL via INTRAVENOUS

## 2023-11-04 MED ORDER — SODIUM CHLORIDE 0.9% FLUSH
3.0000 mL | Freq: Two times a day (BID) | INTRAVENOUS | Status: DC
  Administered 2023-11-04 – 2023-11-05 (×2): 3 mL via INTRAVENOUS

## 2023-11-04 MED ORDER — IOHEXOL 350 MG/ML SOLN
INTRAVENOUS | Status: DC | PRN
  Administered 2023-11-04: 55 mL

## 2023-11-04 MED ORDER — ASPIRIN 81 MG PO TBEC
81.0000 mg | DELAYED_RELEASE_TABLET | Freq: Every day | ORAL | Status: DC
  Administered 2023-11-05: 81 mg via ORAL
  Filled 2023-11-04: qty 1

## 2023-11-04 MED ORDER — ATORVASTATIN CALCIUM 80 MG PO TABS
80.0000 mg | ORAL_TABLET | Freq: Every day | ORAL | Status: DC
  Administered 2023-11-04 – 2023-11-10 (×6): 80 mg via ORAL
  Filled 2023-11-04 (×6): qty 1

## 2023-11-04 MED ORDER — SODIUM CHLORIDE 0.9% FLUSH: 3.0000 mL | INTRAVENOUS | Status: DC | PRN

## 2023-11-04 MED ORDER — SODIUM CHLORIDE 0.9 % IV SOLN: 250.0000 mL | INTRAVENOUS | Status: AC | PRN

## 2023-11-04 MED ORDER — LIDOCAINE HCL (PF) 1 % IJ SOLN
INTRAMUSCULAR | Status: AC
  Filled 2023-11-04: qty 30

## 2023-11-04 MED ORDER — ONDANSETRON HCL 4 MG/2ML IJ SOLN: 4.0000 mg | Freq: Four times a day (QID) | INTRAMUSCULAR | Status: DC | PRN

## 2023-11-04 MED ORDER — HEPARIN SODIUM (PORCINE) 1000 UNIT/ML IJ SOLN
INTRAMUSCULAR | Status: DC | PRN
  Administered 2023-11-04: 4000 [IU] via INTRAVENOUS

## 2023-11-04 MED ORDER — MIDAZOLAM HCL 2 MG/2ML IJ SOLN
INTRAMUSCULAR | Status: AC
  Filled 2023-11-04: qty 2

## 2023-11-04 MED ORDER — VERAPAMIL HCL 2.5 MG/ML IV SOLN
INTRAVENOUS | Status: AC
  Filled 2023-11-04: qty 2

## 2023-11-04 MED ORDER — METOPROLOL SUCCINATE ER 25 MG PO TB24
12.5000 mg | ORAL_TABLET | Freq: Every day | ORAL | Status: DC
  Administered 2023-11-04 – 2023-11-05 (×2): 12.5 mg via ORAL
  Filled 2023-11-04 (×2): qty 1

## 2023-11-04 MED ORDER — HYDRALAZINE HCL 20 MG/ML IJ SOLN: 10.0000 mg | INTRAMUSCULAR | Status: AC | PRN

## 2023-11-04 MED ORDER — SODIUM CHLORIDE 0.9 % WEIGHT BASED INFUSION: 1.0000 mL/kg/h | INTRAVENOUS | Status: DC

## 2023-11-04 MED ORDER — VERAPAMIL HCL 2.5 MG/ML IV SOLN
INTRAVENOUS | Status: DC | PRN
  Administered 2023-11-04: 10 mL via INTRA_ARTERIAL

## 2023-11-04 MED ORDER — MIDAZOLAM HCL 2 MG/2ML IJ SOLN
INTRAMUSCULAR | Status: DC | PRN
Start: 2023-11-04 — End: 2023-11-04
  Administered 2023-11-04: 2 mg via INTRAVENOUS

## 2023-11-04 MED ORDER — SODIUM CHLORIDE 0.9 % WEIGHT BASED INFUSION
3.0000 mL/kg/h | INTRAVENOUS | Status: DC
  Administered 2023-11-04: 3 mL/kg/h via INTRAVENOUS

## 2023-11-04 MED ORDER — HEPARIN (PORCINE) 25000 UT/250ML-% IV SOLN
1000.0000 [IU]/h | INTRAVENOUS | Status: DC
  Administered 2023-11-04: 800 [IU]/h via INTRAVENOUS
  Administered 2023-11-05: 1000 [IU]/h via INTRAVENOUS
  Filled 2023-11-04 (×2): qty 250

## 2023-11-04 MED ORDER — TAMSULOSIN HCL 0.4 MG PO CAPS
0.4000 mg | ORAL_CAPSULE | Freq: Every day | ORAL | Status: AC
Start: 2023-11-04 — End: ?
  Administered 2023-11-04 – 2023-11-09 (×5): 0.4 mg via ORAL
  Filled 2023-11-04 (×5): qty 1

## 2023-11-04 NOTE — H&P (Signed)
 Expand All Collapse All  Cardiology Office Note:     Date:  10/30/2023    ID:  RANNY WIEBELHAUS, DOB 02-01-1951, MRN [id number]   PCP:  Georgann Housekeeper, MD              [facility] Providers Cardiologist:  None      Referring MD: Ladene Artist, MD       Chief Complaint  Patient presents with   Consult  TIA GELB is a 73 y.o. male who is being seen today for the evaluation of exertional chest discomfort at the request of Ladene Artist, MD.     History of Present Illness:     ROLDAN LAFOREST is a 73 y.o. male with a hx of adenocarcinoma of the pancreas currently in remission following neoadjuvant chemotherapy and Whipple procedure (March 2023), followed by consolidation chemotherapy, history of hypercholesterolemia and hypertension, evidence of advanced three-vessel coronary artery calcification on chest CT referred for complaints of exertional angina.   Mr. Weekly exercises regularly and enjoys mountain biking.  He predictably gets chest discomfort when he bikes uphill on the initial portion of the [employer] [place] trail.  If he stops and rests the symptoms resolved within a couple of minutes.  He does not get angina when biking on level ground or in spin classes.  He enjoys exercising and is trying to build back muscle and weight following his long course of treatment for pancreatic cancer.   His chest discomfort is located in the upper half of the retrosternal area and the lower half of his throat and has a tension quality.  It never occurs at rest.  He does have a history of GERD and is wondering whether symptoms were related to this, but the discomfort only occurs with physical activity and is predictably relieved with rest.  It does not occur following meals or when he lies down.  He denies orthopnea, PND, palpitations, dizziness, syncope, claudication or any focal neurological complaints.   His brother had a myocardial infarction in his late 42s.  His father had a stroke at  age 27 (he does not know if this was ischemic or hemorrhagic).  His mother had a myocardial infarction at age 58.  He has never smoked.  His blood pressure is well-controlled on a very low-dose of valsartan.  He takes atorvastatin for hypercholesterolemia and has an excellent LDL at 43, but also a low HDL at 36.  He does not have diabetes mellitus (hemoglobin A1c 5.2% in January of this year).  Most recent creatinine levels at [facility] were 0.9 and 1.2 on labs performed in 2024.   He has had numerous nongated CT studies and indeed on a CT of the chest from 2022 there is marked calcification in all 3 major coronary artery distributions, as well as mild aortic atherosclerosis.   Tells me that his insurance company, [employer], perform some type of home evaluation and recommended wearing a heart rhythm monitor.  I do not have the results of this test.       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    right temple (MOHS)   BPV (benign positional vertigo)     Colon polyps     GERD (gastroesophageal reflux disease)     Glucose intolerance (impaired glucose tolerance)     History of nuclear stress test  ETT-Myoview 2/18: EF 57, no ischemia, low risk   Hyperlipidemia     Hypertension     Melanoma (HCC) 07/22/2008    RIGHT TEMPLE END STAGE LICHENORO REGRESSION MELANODERMA TX MOHS   Right knee pain     SCC (squamous cell carcinoma) 02/24/2013    left inner forearm ([id number])   Vitreous floaters of both eyes                 Past Surgical History:  Procedure Laterality Date   ARTHROSCOPIC REPAIR ACL Right     BILIARY BRUSHING   10/06/2020    Procedure: BILIARY BRUSHING;  Surgeon: Kerin Salen, MD;  Location: [facility];  Service: Gastroenterology;;   BILIARY STENT PLACEMENT N/A 05/23/2021    Procedure: BILIARY STENT PLACEMENT;  Surgeon: Kerin Salen, MD;  Location: [facility]  END[facility];  Service: Gastroenterology;  Laterality: N/A;   cardiac stress test   08/2008   COLONOSCOPY W/ POLYPECTOMY   2006   ENDOSCOPIC RETROGRADE CHOLANGIOPANCREATOGRAPHY (ERCP) WITH PROPOFOL N/A 05/23/2021    Procedure: ENDOSCOPIC RETROGRADE CHOLANGIOPANCREATOGRAPHY (ERCP) WITH PROPOFOL;  Surgeon: Kerin Salen, MD;  Location: [facility];  Service: Gastroenterology;  Laterality: N/A;   ERCP N/A 10/06/2020    Procedure: ENDOSCOPIC RETROGRADE CHOLANGIOPANCREATOGRAPHY (ERCP);  Surgeon: Kerin Salen, MD;  Location: [facility];  Service: Gastroenterology;  Laterality: N/A;   EUS N/A 05/23/2021    Procedure: UPPER ENDOSCOPIC ULTRASOUND (EUS) LINEAR;  Surgeon: Willis Modena, MD;  Location: [facility];  Service: Endoscopy;  Laterality: N/A;   FINE NEEDLE ASPIRATION N/A 05/23/2021    Procedure: FINE NEEDLE ASPIRATION (FNA) LINEAR;  Surgeon: Willis Modena, MD;  Location: [facility];  Service: Endoscopy;  Laterality: N/A;   I & D EXTREMITY Left 02/03/2020    Procedure: IRRIGATION AND DEBRIDEMENT EXTREMITY;  Surgeon: Knute Neu, MD;  Location: [facility];  Service: Plastics;  Laterality: Left;   IR IMAGING GUIDED PORT INSERTION   06/11/2021   IR REMOVAL TUN ACCESS W/ PORT W/O FL MOD SED   04/03/2022   laser surgery of the eye       SPHINCTEROTOMY   10/06/2020    Procedure: SPHINCTEROTOMY;  Surgeon: Kerin Salen, MD;  Location: [facility];  Service: Gastroenterology;;  balloon sweep          Current Medications: Active Medications      Current Meds  Medication Sig   aspirin EC 81 MG tablet Take 81 mg by mouth daily.   atorvastatin (LIPITOR) 10 MG tablet Take 1 tablet by mouth daily   levothyroxine (SYNTHROID) 25 MCG tablet Take 1 tablet by mouth daily before breakfast   metoprolol succinate (TOPROL-XL) 25 MG 24 hr tablet Take 0.5 tablets (12.5 mg total) by mouth daily. Take with or immediately following a meal.   pantoprazole (PROTONIX) 40 MG tablet TAKE ONE TABLET BY MOUTH EVERY DAY 90 days    tamsulosin (FLOMAX) 0.4 MG CAPS capsule Take 1 capsule by mouth once daily   [DISCONTINUED] valsartan (DIOVAN) 80 MG tablet Take 80 mg by mouth daily.        Allergies:   Ace inhibitors, Lisinopril, and Lisinopril-hydrochlorothiazide    Social History         Socioeconomic History   Marital status: Married      Spouse name: Not on file   Number of children: Not on file   Years of education: Not on file   Highest education level: Not on file  Occupational History   Not on file  Tobacco Use   Smoking  status: Never   Smokeless tobacco: Never  Vaping Use   Vaping status: Never Used  Substance and Sexual Activity   Alcohol use: Not Currently      Comment: occasionally   Drug use: No   Sexual activity: Yes      Comment: married  Other Topics Concern   Not on file  Social History Narrative    Accountant    Married.  2 daughters.    Lived in [place] for many years.  Born in [place].    [employer]; [employer].    Social Drivers of Acupuncturist Strain: Low Risk  (08/21/2022)    Received from [facility], [facility]    Overall Financial Resource Strain (CARDIA)     Difficulty of Paying Living Expenses: Not hard at all  Food Insecurity: No Food Insecurity (08/21/2022)    Received from [facility], [facility]    Hunger Vital Sign     Worried About Running Out of Food in the Last Year: Never true     Ran Out of Food in the Last Year: Never true  Transportation Needs: No Transportation Needs (08/21/2022)    Received from [facility], [facility]    [facility] - Transportation     In the past 12 months, has lack of transportation kept you from medical appointments or from getting medications?: No     Lack of Transportation (Non-Medical): No  Physical Activity: Not on file  Stress: Not on file  Social Connections: Not on file       Family History: The patient's family history includes Breast cancer in his cousin; CAD in his brother and mother; CVA in his father and sister; Cancer in his paternal aunt; Diabetes in his brother and mother; Heart attack (age of onset: 25) in his brother; Heart attack (age of onset: 20) in his mother. There is no history of Heart failure.   ROS:   Please see the history of present illness.     All other systems reviewed and are negative.   EKGs/Labs/Other Studies Reviewed:     The following studies were reviewed today:   EKG Interpretation Date/Time:                  Wednesday October 29 2023 14:45:42 EST Ventricular Rate:         62 PR Interval:                 160 QRS Duration:             86 QT Interval:                 410 QTC Calculation:416 R Axis:                         30   Text Interpretation:Normal sinus rhythm Normal ECG When compared with ECG of 13-May-2022 02:42, No significant change since last tracing Confirmed by Croitoru, Mihai [phone]) on 10/29/2023 2:56:35 PM     Recent Labs: 10/29/2023: BUN 13; Creatinine, Ser 1.42; Hemoglobin 13.6; Platelets 133; Potassium 4.6; Sodium 138  Recent Lipid Panel Labs (Brief)  No results found for: "CHOL", "TRIG", "HDL", "CHOLHDL", "VLDL", "LDLCALC", "LDLDIRECT"   Outside labs reviewed 01/02/2023 Cholesterol 98, HDL 36, LDL 43, triglycerides 41 09/10/2023 Hemoglobin A1c 5.2%   Risk Assessment/Calculations:  Physical Exam:     VS:  BP (!) 94/36 (BP Location: Left Arm, Patient Position: Sitting, Cuff Size: Normal)   Pulse 62   Ht 5\' 6"  (1.676 m)   Wt 142 lb 12.8 oz (64.8 kg)   SpO2 99%   BMI 23.05 kg/m         Wt Readings from Last 3 Encounters:  10/29/23 142 lb 12.8 oz (64.8 kg)  10/14/23 146 lb 8 oz (66.5 kg)  07/14/23 143 lb 1.6 oz (64.9 kg)      GEN: Appears lean and very fit and appears younger than his stated age, well nourished, well developed in no acute distress HEENT:  Normal NECK: No JVD; No carotid bruits LYMPHATICS: No lymphadenopathy CARDIAC: RRR, no murmurs, rubs, gallops RESPIRATORY:  Clear to auscultation without rales, wheezing or rhonchi  ABDOMEN: Soft, non-tender, non-distended MUSCULOSKELETAL:  No edema; No deformity  SKIN: Warm and dry NEUROLOGIC:  Alert and oriented x 3 PSYCHIATRIC:  Normal affect    ASSESSMENT:     1. Coronary artery disease of native artery of native heart with stable angina pectoris (HCC)   2. Essential hypertension   3. Dyslipidemia (high LDL; low HDL)   4. Primary cancer of head of pancreas (HCC)   5. Pre-procedure lab exam     PLAN:     In order of problems listed above:   CAD: Doyel describes typical stable/exertional angina pectoris with a relatively high threshold.  His blood pressure today is fairly low on treatment with valsartan which leaves [place] little room for antianginal therapy.  He also has a relatively slow heart rate in the 60s.  Will try to replace the valsartan with a very low-dose of metoprolol succinate 12.5 mg once daily.  We need to get an updated lipid profile, but his most recent labs suggested excellent reduction in LDL cholesterol and what is probably a chronically low HDL.   He is taking aspirin 81 mg daily and will continue doing so.  Discussed options for further evaluation with either coronary CT angiography or proceeding directly to cardiac catheterization.  Since he has relatively dense coronary calcifications I do have some concern that a coronary CTA may be nondiagnostic.  Cardiac catheterization, while invasive and with a high risk of complications, would also offer the opportunity for percutaneous revascularization, should this be the appropriate treatment.  We discussed the pros and cons of each approach and decided that he will go ahead with invasive cardiac catheterization and possible angioplasty stent which will be scheduled with Dr. Herbie Baltimore next week. HTN: Blood pressure surprisingly  low today but he is asymptomatic.  At an office appointment with Dr. Truett Perna in the oncology clinic a couple of weeks ago his blood pressure was 115/69.  Stop valsartan.  Start low-dose metoprolol succinate. HLP: Suspect that his low HDL cholesterol is inherited, since he is very fit and physically active.  Continue atorvastatin.  Need to get an updated lipid profile.  Target LDL less than 70. History of pancreatic adenocarcinoma: Recovered remarkably well from Whipple procedure and chemotherapy.  At surgery 24 lymph nodes were sampled and were free of disease.  Multiple imaging studies have not shown any evidence of metastasis.  His oncologist is Dr. Myrle Sheng.        Informed Consent Shared Decision Making/Informed Consent The risks [stroke (1 in 1000), death (1 in 1000), kidney failure [usually temporary] (1 in 500), bleeding (1 in 200), allergic reaction [possibly serious] (1 in 200)], benefits (diagnostic support  and management of coronary artery disease) and alternatives of a cardiac catheterization were discussed in detail with Mr. Sacks and he is willing to proceed.          Medication Adjustments/Labs and Tests Ordered: Current medicines are reviewed at length with the patient today.  Concerns regarding medicines are outlined above.     Orders Placed This Encounter  Procedures   CBC   Basic Metabolic Panel (BMET)   EKG 12-Lead        Meds ordered this encounter  Medications   metoprolol succinate (TOPROL-XL) 25 MG 24 hr tablet      Sig: Take 0.5 tablets (12.5 mg total) by mouth daily. Take with or immediately following a meal.      Dispense:  90 tablet      Refill:  3      Patient Instructions  Medication Instructions:  Stop valsartan Start metoprolol succinate 12.5 mg once daily half of a 25 mg tablet daily).     *If you need a refill on your cardiac medications before your next appointment, please call your pharmacy*     Lab Work: Basic Metabolic Panel  Complete  Blood Count      If you have labs (blood work) drawn today and your tests are completely normal, you will receive your results only by: MyChart Message (if you have MyChart) OR A paper copy in the mail If you have any lab test that is abnormal or we need to change your treatment, we will call you to review the results.     Testing/Procedures:   [place] A DEPT OF [place]. [facility] AT [facility] [address] [place] [address] Dept: [phone] Loc: [phone]     ADDENDUM: Patient underwent diagnostic cardiac catheterization this morning for evaluation of crescendo angina as outlined above.  He states that even since this office visit, his angina has worsened and is now occurring with low-level physical activity.  Cardiac catheterization demonstrates critical stenosis of the proximal LAD and ostium of the circumflex with dense calcification at both lesions sites.  The patient's RCA is patent with nonobstructive disease.  His LVEF is normal by ventriculography and his LVEDP is normal.  After review of potential treatment options, I have recommended cardiac surgical evaluation for CABG.  The patient will be admitted to the hospital because of crescendo angina and critical CAD, started on IV heparin, and formal cardiac surgical consultation will be placed.  Tonny Bollman 11/04/2023 10:55 AM

## 2023-11-04 NOTE — H&P (View-Only) (Signed)
 [address]       [place] [address]             [phone]        WOODWARD KLEM [facility] Medical Record #[id number] Date of Birth: 1950/09/30  Referring: Tonny Bollman , MD Primary Care: Georgann Housekeeper, MD Primary Cardiologist: Thurmon Fair, MD   Reason for Consult:  Evaluation for surgical management of severe 2-vessel coronary artery disease.    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  He also has a history of hypertension and dyslipidemia.  He was known to have multiple coronary calcifications on surveillance imaging around the time of his cancer treatment. While seeing his oncologist about 1 month ago he mentioned having neck and chest discomfort while biking uphill on his mountain bike over the previous 2 months.   A referral was made to cardiology ane he was evaluated by Dr. Royann Shivers in the office last week. He stated at the time that he he did not have angina when biking on level ground or during spin classes.  Left heart catheterization was recommended and arranged for today.  Prior to the catheterization this morning David Jarvis said that his angina had worsened and was now occurring with lower levels of activity.   Left heart catheterization demonstrated severe calcified stenoses in the proximal LAD and circumflex coronary arteries.  The RCA had minor nonobstructive irregularities.  The left ventriculogram showed normal LV function and the LVEDP was normal.  Due to the recent escalation from stable angina to crescendo angina and the finding of critical coronary lesions, Dr. Excell Seltzer recommended hospital admission today and initiation of IV heparin.  CT surgery has been asked to evaluate David Jarvis for coronary bypass grafting. Currently, David Jarvis is resting in bed and denies having any  chest pain or shortness of breath.  He relates that he just recently began working on fitness training in an attempt to regain muscle mass that he lost during cancer treatment.  He is married and although his wife was not with him in the room she was able to listen in on our conversation by phone and asked questions.  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  Current Activity/ Functional Status: Patient is independent with mobility/ambulation, transfers, ADL's, IADL's.   Zubrod Score: At the time of surgery this patient's most appropriate activity status/level should be described as: []     0    Normal activity, no symptoms [x]     1    Restricted in physical strenuous activity but ambulatory, able to do out light work []     2    Ambulatory and capable of self care, unable to do work activities, up and about                 more than 50%  Of the time                            []     3    Only limited self care, in bed greater than 50% of waking hours []     4    Completely disabled, no self care, confined to bed or chair []     5    Moribund  Past Medical History:  Diagnosis Date  Atypical nevus 04/28/2008   left upper back-moderate-   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   right temple (MOHS)   BPV (benign positional vertigo)    Colon polyps    GERD (gastroesophageal reflux disease)    Glucose intolerance (impaired glucose tolerance)    History of nuclear stress test    ETT-Myoview 2/18: EF 57, no ischemia, low risk   Hyperlipidemia    Hypertension    Melanoma (HCC) 07/22/2008   RIGHT TEMPLE END STAGE LICHENORO REGRESSION MELANODERMA TX MOHS   Right knee pain    SCC (squamous cell carcinoma) 02/24/2013   left inner forearm (Cx35FU)   Vitreous floaters of both eyes     Past Surgical History:  Procedure Laterality Date   ARTHROSCOPIC REPAIR ACL Right    BILIARY BRUSHING  10/06/2020   Procedure: BILIARY BRUSHING;  Surgeon: Kerin Salen, MD;  Location: [facility];  Service: Gastroenterology;;   BILIARY STENT PLACEMENT N/A 05/23/2021   Procedure: BILIARY STENT PLACEMENT;  Surgeon: Kerin Salen, MD;  Location: [facility];  Service: Gastroenterology;  Laterality: N/A;   cardiac stress test  08/2008   COLONOSCOPY W/ POLYPECTOMY  2006   ENDOSCOPIC RETROGRADE CHOLANGIOPANCREATOGRAPHY (ERCP) WITH PROPOFOL N/A 05/23/2021   Procedure: ENDOSCOPIC RETROGRADE CHOLANGIOPANCREATOGRAPHY (ERCP) WITH PROPOFOL;  Surgeon: Kerin Salen, MD;  Location: [facility];  Service: Gastroenterology;  Laterality: N/A;   ERCP N/A 10/06/2020   Procedure: ENDOSCOPIC RETROGRADE CHOLANGIOPANCREATOGRAPHY (ERCP);  Surgeon: Kerin Salen, MD;  Location: [facility];  Service: Gastroenterology;  Laterality: N/A;   EUS N/A 05/23/2021   Procedure: UPPER ENDOSCOPIC ULTRASOUND (EUS) LINEAR;  Surgeon: Willis Modena, MD;  Location: [facility];  Service: Endoscopy;  Laterality: N/A;   FINE NEEDLE ASPIRATION N/A 05/23/2021   Procedure: FINE NEEDLE ASPIRATION (FNA) LINEAR;  Surgeon: Willis Modena, MD;  Location: [facility];  Service: Endoscopy;  Laterality: N/A;   I & D EXTREMITY Left 02/03/2020   Procedure: IRRIGATION AND DEBRIDEMENT EXTREMITY;  Surgeon: Knute Neu, MD;  Location: [facility];  Service: Plastics;  Laterality: Left;   IR IMAGING GUIDED PORT INSERTION  06/11/2021   IR REMOVAL TUN ACCESS W/ PORT W/O FL MOD SED  04/03/2022   laser surgery of the eye     SPHINCTEROTOMY  10/06/2020   Procedure: SPHINCTEROTOMY;  Surgeon: Kerin Salen, MD;  Location: [facility];  Service: Gastroenterology;;  balloon sweep    Social History   Tobacco Use  Smoking Status Never  Smokeless Tobacco Never    Social History   Substance and Sexual Activity  Alcohol Use Not Currently   Comment: occasionally     Allergies  Allergen Reactions   Ace Inhibitors Cough   Lisinopril Cough   Lisinopril-Hydrochlorothiazide Cough    Current Facility-Administered Medications  Medication Dose Route  Frequency Provider Last Rate Last Admin   0.9 %  sodium chloride infusion   Intravenous Continuous Tonny Bollman, MD       0.9% sodium chloride infusion  1 mL/kg/hr Intravenous Continuous Croitoru, Mihai, MD 64.8 mL/hr at 11/04/23 0902 1 mL/kg/hr at 11/04/23 0902   acetaminophen (TYLENOL) tablet 650 mg  650 mg Oral Q4H PRN Tonny Bollman, MD       aspirin chewable tablet 81 mg  81 mg Oral Pre-Cath Croitoru, Mihai, MD       fentaNYL (SUBLIMAZE) injection    PRN Tonny Bollman, MD   25 mcg at 11/04/23 0946   Heparin (Porcine) in NaCl 1000-0.9 UT/500ML-% SOLN    PRN Tonny Bollman, MD  500 mL at 11/04/23 1000   heparin sodium (porcine) injection    PRN Tonny Bollman, MD   4,000 Units at 11/04/23 1003   hydrALAZINE (APRESOLINE) injection 10 mg  10 mg Intravenous Q20 Min PRN Tonny Bollman, MD       iohexol (OMNIPAQUE) 350 MG/ML injection    PRN Tonny Bollman, MD   55 mL at 11/04/23 1014   labetalol (NORMODYNE) injection 10 mg  10 mg Intravenous Q10 min PRN Tonny Bollman, MD       lidocaine (PF) (XYLOCAINE) 1 % injection    PRN Tonny Bollman, MD   2 mL at 11/04/23 0957   midazolam (VERSED) injection    PRN Tonny Bollman, MD   2 mg at 11/04/23 0945   ondansetron [facility]) injection 4 mg  4 mg Intravenous Q6H PRN Tonny Bollman, MD       Radial Cocktail/Verapamil only    PRN Tonny Bollman, MD   10 mL at 11/04/23 1610   Facility-Administered Medications Ordered in Other Encounters  Medication Dose Route Frequency Provider Last Rate Last Admin   sodium chloride flush (NS) 0.9 % injection 10 mL  10 mL Intravenous PRN Rana Snare, NP   10 mL at 01/21/22 0931    Medications Prior to Admission  Medication Sig Dispense Refill Last Dose/Taking   aspirin EC 81 MG tablet Take 81 mg by mouth daily.   11/04/2023 at  6:00 AM   atorvastatin (LIPITOR) 10 MG tablet Take 1 tablet by mouth daily 90 tablet 1 11/03/2023 Bedtime   levothyroxine (SYNTHROID) 25 MCG tablet Take 1 tablet by mouth daily  before breakfast 90 tablet 3 11/04/2023 at  6:00 AM   metoprolol succinate (TOPROL-XL) 25 MG 24 hr tablet Take 0.5 tablets (12.5 mg total) by mouth daily. Take with or immediately following a meal. 90 tablet 3 11/03/2023 Bedtime   pantoprazole (PROTONIX) 40 MG tablet TAKE ONE TABLET BY MOUTH EVERY DAY 90 days 90 tablet 3 11/04/2023 at  6:00 AM   tamsulosin (FLOMAX) 0.4 MG CAPS capsule Take 1 capsule by mouth once daily 90 capsule 4 11/03/2023 Bedtime    Family History  Problem Relation Age of Onset   CAD Mother    Diabetes Mother    Heart attack Mother 74   CVA Father    CVA Sister    CAD Brother    Diabetes Brother    Heart attack Brother 2   Cancer Paternal Aunt        unknown type dx. 40s   Breast cancer Cousin        paternal cousin, dx. 41s   Heart failure Neg Hx      Review of Systems:     Cardiac Review of Systems: Y or  [    ]= no  Chest Pain [  x  ]  Resting SOB [   ] Exertional SOB  [ x ]  Orthopnea [  ]   Pedal Edema [   ]    Palpitations [  ] Syncope  [  ]   Presyncope [   ]  General Review of Systems: [Y] = yes [  ]=no Constitional: recent weight change [  ]; anorexia [  ]; fatigue [  ]; nausea [  ]; night sweats [  ]; fever [  ]; or chills [  ]  Dental: Last Dentist visit: 2 weeks ago  Eye : blurred vision [  ]; diplopia [   ]; vision changes [  ];  Amaurosis fugax[  ]; Resp: cough [  ];  wheezing[  ];  hemoptysis[  ]; shortness of breath[  ]; paroxysmal nocturnal dyspnea[  ]; dyspnea on exertion[  ]; or orthopnea[  ];  GI:  gallstones[  ], vomiting[  ];  dysphagia[  ]; melena[  ];  hematochezia [  ]; heartburn[  ];   Hx of  Colonoscopy[  ]; GU: kidney stones [  ]; hematuria[  ];   dysuria [  ];  nocturia[  ];  history of     obstruction [  ]; urinary frequency [  ]             Skin: rash, swelling[  ];, hair loss[  ];  peripheral edema[  ];  or itching[  ]; Musculosketetal: myalgias[  ];  joint swelling[  ];   joint erythema[  ];  joint pain[  ];  back pain[  ];  Heme/Lymph: bruising[  ];  bleeding[  ];  anemia[  ];  Neuro: TIA[  ];  headaches[  ];  stroke[  ];  vertigo[  ];  seizures[  ];   paresthesias[  ];  difficulty walking[  ];  Psych:depression[  ]; anxiety[  ];  Endocrine: diabetes[  ];  thyroid dysfunction[  ];                  Physical Exam: BP (!) 145/73   Pulse (!) 49   Temp 98.4 F (36.9 C) (Oral)   Resp 13   Ht 5\' 6"  (1.676 m)   Wt 63.5 kg   SpO2 97%   BMI 22.60 kg/m    General appearance: alert, cooperative, and no distress Head: Normocephalic, without obvious abnormality, atraumatic Neck: no adenopathy, no carotid bruit, no JVD, and supple, symmetrical, trachea midline Lymph nodes: No cervical or clavicular adenopathy Resp: Breath sounds full, equal, and clear to auscultation Cardio: Regular rhythm, monitor shows sinus bradycardia.  No murmur GI: Soft and flat, normal bowel sounds.  Well-healed port incisions in the right and left upper quadrants and near the umbilicus Extremities: No deformities, all well-perfused.  There are distal palpable pulses in all extremities.  A TR band is overlying the right radial artery. Neurologic: Grossly normal  Diagnostic Studies & Laboratory data:    LEFT HEART CATH AND CORONARY ANGIOGRAPHY   Conclusion  1.  Critical heavily calcified proximal LAD stenosis 2.  Critical heavily calcified ostial left circumflex stenosis 3.  Patent RCA with mild nonobstructive plaquing in the main vessel and moderate 50% stenosis at the origin of the PDA, likely not flow-limiting 4.  Normal LVEF with no regional wall motion abnormalities and normal LVEDP   Recommendations: I think the patient has surgical anatomy and would do best with CABG, likely with a LIMA to LAD and bypass graft to one of his obtuse marginals.  He will be admitted for IV heparin in the setting of high risk anatomy and cardiac surgical consultation will be obtained.  Coronary  Findings  Diagnostic Dominance: Right Left Main  There is mild diffuse disease throughout the vessel.    Left Anterior Descending  The LAD has critical heavily calcified subtotal occlusion in the proximal vessel at the origin of the first diagonal. Beyond that area of the LAD is patent with mild nonobstructive plaque and appears to be suitable for  grafting.  Prox LAD to Mid LAD lesion is 99% stenosed.    Left Circumflex  The circumflex has critical stenosis as it originates from the left main with severe eccentric heavily calcified plaque with a spiral filling pattern. There are 2 marginal branches with mild nonobstructive plaque that would be likely suitable for bypass grafting.  Ost Cx to Prox Cx lesion is 95% stenosed.    Right Coronary Artery  There is mild diffuse disease throughout the vessel. Dominant vessel, mild diffuse plaquing throughout the proximal, mid, and distal segments. The PDA has a 50% proximal stenosis that does not appear flow-limiting.    Right Posterior Descending Artery  RPDA lesion is 50% stenosed.    Intervention   No interventions have been documented.   Wall Motion              Left Heart  Left Ventricle The left ventricular size is normal. The left ventricular systolic function is normal. LV end diastolic pressure is normal. The left ventricular ejection fraction is 55-65% by visual estimate. No regional wall motion abnormalities.   Coronary Diagrams  Diagnostic Dominance: Right   Recent Radiology Findings:   Preop vascular studies and the echocardiogram are pending.   I have independently reviewed the above radiologic studies and discussed with the patient   Recent Lab Findings: Lab Results  Component Value Date   WBC 3.9 10/29/2023   HGB 13.6 10/29/2023   HCT 39.8 10/29/2023   PLT 133 (L) 10/29/2023   GLUCOSE 137 (H) 10/29/2023   ALT 67 (H) 06/07/2022   AST 63 (H) 06/07/2022   NA 138 10/29/2023   K 4.6 10/29/2023   CL 103  10/29/2023   CREATININE 1.42 (H) 10/29/2023   BUN 13 10/29/2023   CO2 23 10/29/2023   INR 1.1 12/02/2021      Assessment / Plan:      -Coronary artery disease with crescendo angina: Left heart catheterization demonstrates high-grade stenoses in the proximal LAD and circumflex coronary arteries he has preserved LV function.  Echocardiogram is pending.  We agree that coronary bypass grafting is his best option for revascularization.  The procedure and expected perioperative course were discussed with David Jarvis and his wife in detail and he would like for [place] to proceed with preoperative assessment and planning.  He is tentatively scheduled for coronary bypass grafting by Dr. Dorris Fetch on Thursday, 11/06/2023.  -Stage Ib pancreatic adenocarcinoma status post Whipple procedure and subsequent chemotherapy.  He has close follow-up with his oncologist, Dr. Thornton Papas, and has had no evidence of recurrent or metastatic disease.  -Hypertension-treated with valsartan prior to this admission but was noted to be relatively hypotensive recently and was switched to low-dose metoprolol XL.  -Dyslipidemia: On atorvastatin.  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  Recheck BMP tomorrow after contrast exposure.   I  spent 25 minutes counseling the patient face to face.   Leary Roca, PA-C  11/04/2023 12:09 PM

## 2023-11-04 NOTE — Progress Notes (Signed)
 Small increase in hematoma post removal of 1 cc of air from right radial TR band. TR band repositioned and 12 cc air replaced in band. Post activity and precautions explained. VSS. Call bell near.David Jarvis

## 2023-11-04 NOTE — Progress Notes (Addendum)
 PHARMACY - ANTICOAGULATION CONSULT NOTE  Pharmacy Consult for Heparin Indication: chest pain/ACS  Allergies  Allergen Reactions   Ace Inhibitors Cough   Lisinopril Cough   Lisinopril-Hydrochlorothiazide Cough    Patient Measurements: Height: 5\' 6"  (167.6 cm) Weight: 63.5 kg (140 lb) IBW/kg (Calculated) : 63.8 Heparin Dosing Weight: 63.5kg  Vital Signs: Temp: 97.7 F (36.5 C) (03/04 1401) Temp Source: Oral (03/04 1401) BP: 144/62 (03/04 1401) Pulse Rate: 55 (03/04 1401)  Labs: No results for input(s): "HGB", "HCT", "PLT", "APTT", "LABPROT", "INR", "HEPARINUNFRC", "HEPRLOWMOCWT", "CREATININE", "CKTOTAL", "CKMB", "TROPONINIHS" in the last 72 hours.  Estimated Creatinine Clearance: 42.9 mL/min (A) (by C-G formula based on SCr of 1.42 mg/dL (H)).   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   right temple (MOHS)   BPV (benign positional vertigo)    Colon polyps    GERD (gastroesophageal reflux disease)    Glucose intolerance (impaired glucose tolerance)    History of nuclear stress test    ETT-Myoview 2/18: EF 57, no ischemia, low risk   Hyperlipidemia    Hypertension    Melanoma (HCC) 07/22/2008   RIGHT TEMPLE END STAGE LICHENORO REGRESSION MELANODERMA TX MOHS   Right knee pain    SCC (squamous cell carcinoma) 02/24/2013   left inner forearm (Cx35FU)   Vitreous floaters of both eyes     Medications:  Scheduled:   [START ON 11/05/2023] aspirin EC  81 mg Oral Daily   atorvastatin  80 mg Oral Daily   [START ON 11/05/2023] levothyroxine  25 mcg Oral Q0600   metoprolol succinate  12.5 mg Oral Daily   [START ON 11/05/2023] pantoprazole  40 mg Oral Daily   sodium chloride flush  3 mL Intravenous Q12H   tamsulosin  0.4 mg Oral QPC supper   Infusions:   sodium chloride     sodium chloride     Assessment: 72YOM who presented with  exertional chest discomfort. Underwent LHC which revealed proximal LAD and left circumflex stenosis. Not on AC PTA. Pharmacy has been consulted to start heparin 8 hours post sheath removal.  Sheath removed 3/4 @ 1015. No CBC obtained this admission but Hgb 13.6 on 2/26 CBC. Received 4000 units heparin during LHC. CABG scheduled for 3/6.  Goal of Therapy:  Heparin level 0.3-0.7 units/ml Monitor platelets by anticoagulation protocol: Yes   Plan:  Start heparin infusion at 800 units/hr @ 1800 Check anti-Xa level in 8 hours from heparin start and daily while on heparin Continue to monitor H&H and platelets F/u post-CABG plans  Nicole Kindred, PharmD PGY1 Pharmacy Resident 11/04/2023 2:37 PM

## 2023-11-04 NOTE — Interval H&P Note (Signed)
 History and Physical Interval Note:  11/04/2023 9:44 AM  David Jarvis  has presented today for surgery, with the diagnosis of chest pain.  The various methods of treatment have been discussed with the patient and family. After consideration of risks, benefits and other options for treatment, the patient has consented to  Procedure(s): LEFT HEART CATH AND CORONARY ANGIOGRAPHY (N/A) as a surgical intervention.  The patient's history has been reviewed, patient examined, no change in status, stable for surgery.  I have reviewed the patient's chart and labs.  Questions were answered to the patient's satisfaction.     Tonny Bollman

## 2023-11-04 NOTE — Progress Notes (Signed)
  Echocardiogram 2D Echocardiogram has been performed.  David Jarvis David Jarvis 11/04/2023, 2:32 PM

## 2023-11-04 NOTE — Progress Notes (Signed)
 CVTS PA at bedside talking w/patient.

## 2023-11-04 NOTE — Consult Note (Signed)
 [address]       [place] [address]             [phone]        WOODWARD KLEM [facility] Medical Record #[id number] Date of Birth: 1950/09/30  Referring: Tonny Bollman , MD Primary Care: Georgann Housekeeper, MD Primary Cardiologist: Thurmon Fair, MD   Reason for Consult:  Evaluation for surgical management of severe 2-vessel coronary artery disease.    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  He also has a history of hypertension and dyslipidemia.  He was known to have multiple coronary calcifications on surveillance imaging around the time of his cancer treatment. While seeing his oncologist about 1 month ago he mentioned having neck and chest discomfort while biking uphill on his mountain bike over the previous 2 months.   A referral was made to cardiology ane he was evaluated by Dr. Royann Shivers in the office last week. He stated at the time that he he did not have angina when biking on level ground or during spin classes.  Left heart catheterization was recommended and arranged for today.  Prior to the catheterization this morning David Jarvis said that his angina had worsened and was now occurring with lower levels of activity.   Left heart catheterization demonstrated severe calcified stenoses in the proximal LAD and circumflex coronary arteries.  The RCA had minor nonobstructive irregularities.  The left ventriculogram showed normal LV function and the LVEDP was normal.  Due to the recent escalation from stable angina to crescendo angina and the finding of critical coronary lesions, Dr. Excell Seltzer recommended hospital admission today and initiation of IV heparin.  CT surgery has been asked to evaluate David Jarvis for coronary bypass grafting. Currently, David Jarvis is resting in bed and denies having any  chest pain or shortness of breath.  He relates that he just recently began working on fitness training in an attempt to regain muscle mass that he lost during cancer treatment.  He is married and although his wife was not with him in the room she was able to listen in on our conversation by phone and asked questions.  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  Current Activity/ Functional Status: Patient is independent with mobility/ambulation, transfers, ADL's, IADL's.   Zubrod Score: At the time of surgery this patient's most appropriate activity status/level should be described as: []     0    Normal activity, no symptoms [x]     1    Restricted in physical strenuous activity but ambulatory, able to do out light work []     2    Ambulatory and capable of self care, unable to do work activities, up and about                 more than 50%  Of the time                            []     3    Only limited self care, in bed greater than 50% of waking hours []     4    Completely disabled, no self care, confined to bed or chair []     5    Moribund  Past Medical History:  Diagnosis Date  Atypical nevus 04/28/2008   left upper back-moderate-   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   right temple (MOHS)   BPV (benign positional vertigo)    Colon polyps    GERD (gastroesophageal reflux disease)    Glucose intolerance (impaired glucose tolerance)    History of nuclear stress test    ETT-Myoview 2/18: EF 57, no ischemia, low risk   Hyperlipidemia    Hypertension    Melanoma (HCC) 07/22/2008   RIGHT TEMPLE END STAGE LICHENORO REGRESSION MELANODERMA TX MOHS   Right knee pain    SCC (squamous cell carcinoma) 02/24/2013   left inner forearm (Cx35FU)   Vitreous floaters of both eyes     Past Surgical History:  Procedure Laterality Date   ARTHROSCOPIC REPAIR ACL Right    BILIARY BRUSHING  10/06/2020   Procedure: BILIARY BRUSHING;  Surgeon: Kerin Salen, MD;  Location: [facility];  Service: Gastroenterology;;   BILIARY STENT PLACEMENT N/A 05/23/2021   Procedure: BILIARY STENT PLACEMENT;  Surgeon: Kerin Salen, MD;  Location: [facility];  Service: Gastroenterology;  Laterality: N/A;   cardiac stress test  08/2008   COLONOSCOPY W/ POLYPECTOMY  2006   ENDOSCOPIC RETROGRADE CHOLANGIOPANCREATOGRAPHY (ERCP) WITH PROPOFOL N/A 05/23/2021   Procedure: ENDOSCOPIC RETROGRADE CHOLANGIOPANCREATOGRAPHY (ERCP) WITH PROPOFOL;  Surgeon: Kerin Salen, MD;  Location: [facility];  Service: Gastroenterology;  Laterality: N/A;   ERCP N/A 10/06/2020   Procedure: ENDOSCOPIC RETROGRADE CHOLANGIOPANCREATOGRAPHY (ERCP);  Surgeon: Kerin Salen, MD;  Location: [facility];  Service: Gastroenterology;  Laterality: N/A;   EUS N/A 05/23/2021   Procedure: UPPER ENDOSCOPIC ULTRASOUND (EUS) LINEAR;  Surgeon: Willis Modena, MD;  Location: [facility];  Service: Endoscopy;  Laterality: N/A;   FINE NEEDLE ASPIRATION N/A 05/23/2021   Procedure: FINE NEEDLE ASPIRATION (FNA) LINEAR;  Surgeon: Willis Modena, MD;  Location: [facility];  Service: Endoscopy;  Laterality: N/A;   I & D EXTREMITY Left 02/03/2020   Procedure: IRRIGATION AND DEBRIDEMENT EXTREMITY;  Surgeon: Knute Neu, MD;  Location: [facility];  Service: Plastics;  Laterality: Left;   IR IMAGING GUIDED PORT INSERTION  06/11/2021   IR REMOVAL TUN ACCESS W/ PORT W/O FL MOD SED  04/03/2022   laser surgery of the eye     SPHINCTEROTOMY  10/06/2020   Procedure: SPHINCTEROTOMY;  Surgeon: Kerin Salen, MD;  Location: [facility];  Service: Gastroenterology;;  balloon sweep    Social History   Tobacco Use  Smoking Status Never  Smokeless Tobacco Never    Social History   Substance and Sexual Activity  Alcohol Use Not Currently   Comment: occasionally     Allergies  Allergen Reactions   Ace Inhibitors Cough   Lisinopril Cough   Lisinopril-Hydrochlorothiazide Cough    Current Facility-Administered Medications  Medication Dose Route  Frequency Provider Last Rate Last Admin   0.9 %  sodium chloride infusion   Intravenous Continuous Tonny Bollman, MD       0.9% sodium chloride infusion  1 mL/kg/hr Intravenous Continuous Croitoru, Mihai, MD 64.8 mL/hr at 11/04/23 0902 1 mL/kg/hr at 11/04/23 0902   acetaminophen (TYLENOL) tablet 650 mg  650 mg Oral Q4H PRN Tonny Bollman, MD       aspirin chewable tablet 81 mg  81 mg Oral Pre-Cath Croitoru, Mihai, MD       fentaNYL (SUBLIMAZE) injection    PRN Tonny Bollman, MD   25 mcg at 11/04/23 0946   Heparin (Porcine) in NaCl 1000-0.9 UT/500ML-% SOLN    PRN Tonny Bollman, MD  500 mL at 11/04/23 1000   heparin sodium (porcine) injection    PRN Tonny Bollman, MD   4,000 Units at 11/04/23 1003   hydrALAZINE (APRESOLINE) injection 10 mg  10 mg Intravenous Q20 Min PRN Tonny Bollman, MD       iohexol (OMNIPAQUE) 350 MG/ML injection    PRN Tonny Bollman, MD   55 mL at 11/04/23 1014   labetalol (NORMODYNE) injection 10 mg  10 mg Intravenous Q10 min PRN Tonny Bollman, MD       lidocaine (PF) (XYLOCAINE) 1 % injection    PRN Tonny Bollman, MD   2 mL at 11/04/23 0957   midazolam (VERSED) injection    PRN Tonny Bollman, MD   2 mg at 11/04/23 0945   ondansetron [facility]) injection 4 mg  4 mg Intravenous Q6H PRN Tonny Bollman, MD       Radial Cocktail/Verapamil only    PRN Tonny Bollman, MD   10 mL at 11/04/23 1610   Facility-Administered Medications Ordered in Other Encounters  Medication Dose Route Frequency Provider Last Rate Last Admin   sodium chloride flush (NS) 0.9 % injection 10 mL  10 mL Intravenous PRN Rana Snare, NP   10 mL at 01/21/22 0931    Medications Prior to Admission  Medication Sig Dispense Refill Last Dose/Taking   aspirin EC 81 MG tablet Take 81 mg by mouth daily.   11/04/2023 at  6:00 AM   atorvastatin (LIPITOR) 10 MG tablet Take 1 tablet by mouth daily 90 tablet 1 11/03/2023 Bedtime   levothyroxine (SYNTHROID) 25 MCG tablet Take 1 tablet by mouth daily  before breakfast 90 tablet 3 11/04/2023 at  6:00 AM   metoprolol succinate (TOPROL-XL) 25 MG 24 hr tablet Take 0.5 tablets (12.5 mg total) by mouth daily. Take with or immediately following a meal. 90 tablet 3 11/03/2023 Bedtime   pantoprazole (PROTONIX) 40 MG tablet TAKE ONE TABLET BY MOUTH EVERY DAY 90 days 90 tablet 3 11/04/2023 at  6:00 AM   tamsulosin (FLOMAX) 0.4 MG CAPS capsule Take 1 capsule by mouth once daily 90 capsule 4 11/03/2023 Bedtime    Family History  Problem Relation Age of Onset   CAD Mother    Diabetes Mother    Heart attack Mother 74   CVA Father    CVA Sister    CAD Brother    Diabetes Brother    Heart attack Brother 2   Cancer Paternal Aunt        unknown type dx. 40s   Breast cancer Cousin        paternal cousin, dx. 41s   Heart failure Neg Hx      Review of Systems:     Cardiac Review of Systems: Y or  [    ]= no  Chest Pain [  x  ]  Resting SOB [   ] Exertional SOB  [ x ]  Orthopnea [  ]   Pedal Edema [   ]    Palpitations [  ] Syncope  [  ]   Presyncope [   ]  General Review of Systems: [Y] = yes [  ]=no Constitional: recent weight change [  ]; anorexia [  ]; fatigue [  ]; nausea [  ]; night sweats [  ]; fever [  ]; or chills [  ]  Dental: Last Dentist visit: 2 weeks ago  Eye : blurred vision [  ]; diplopia [   ]; vision changes [  ];  Amaurosis fugax[  ]; Resp: cough [  ];  wheezing[  ];  hemoptysis[  ]; shortness of breath[  ]; paroxysmal nocturnal dyspnea[  ]; dyspnea on exertion[  ]; or orthopnea[  ];  GI:  gallstones[  ], vomiting[  ];  dysphagia[  ]; melena[  ];  hematochezia [  ]; heartburn[  ];   Hx of  Colonoscopy[  ]; GU: kidney stones [  ]; hematuria[  ];   dysuria [  ];  nocturia[  ];  history of     obstruction [  ]; urinary frequency [  ]             Skin: rash, swelling[  ];, hair loss[  ];  peripheral edema[  ];  or itching[  ]; Musculosketetal: myalgias[  ];  joint swelling[  ];   joint erythema[  ];  joint pain[  ];  back pain[  ];  Heme/Lymph: bruising[  ];  bleeding[  ];  anemia[  ];  Neuro: TIA[  ];  headaches[  ];  stroke[  ];  vertigo[  ];  seizures[  ];   paresthesias[  ];  difficulty walking[  ];  Psych:depression[  ]; anxiety[  ];  Endocrine: diabetes[  ];  thyroid dysfunction[  ];                  Physical Exam: BP (!) 145/73   Pulse (!) 49   Temp 98.4 F (36.9 C) (Oral)   Resp 13   Ht 5\' 6"  (1.676 m)   Wt 63.5 kg   SpO2 97%   BMI 22.60 kg/m    General appearance: alert, cooperative, and no distress Head: Normocephalic, without obvious abnormality, atraumatic Neck: no adenopathy, no carotid bruit, no JVD, and supple, symmetrical, trachea midline Lymph nodes: No cervical or clavicular adenopathy Resp: Breath sounds full, equal, and clear to auscultation Cardio: Regular rhythm, monitor shows sinus bradycardia.  No murmur GI: Soft and flat, normal bowel sounds.  Well-healed port incisions in the right and left upper quadrants and near the umbilicus Extremities: No deformities, all well-perfused.  There are distal palpable pulses in all extremities.  A TR band is overlying the right radial artery. Neurologic: Grossly normal  Diagnostic Studies & Laboratory data:    LEFT HEART CATH AND CORONARY ANGIOGRAPHY   Conclusion  1.  Critical heavily calcified proximal LAD stenosis 2.  Critical heavily calcified ostial left circumflex stenosis 3.  Patent RCA with mild nonobstructive plaquing in the main vessel and moderate 50% stenosis at the origin of the PDA, likely not flow-limiting 4.  Normal LVEF with no regional wall motion abnormalities and normal LVEDP   Recommendations: I think the patient has surgical anatomy and would do best with CABG, likely with a LIMA to LAD and bypass graft to one of his obtuse marginals.  He will be admitted for IV heparin in the setting of high risk anatomy and cardiac surgical consultation will be obtained.  Coronary  Findings  Diagnostic Dominance: Right Left Main  There is mild diffuse disease throughout the vessel.    Left Anterior Descending  The LAD has critical heavily calcified subtotal occlusion in the proximal vessel at the origin of the first diagonal. Beyond that area of the LAD is patent with mild nonobstructive plaque and appears to be suitable for  grafting.  Prox LAD to Mid LAD lesion is 99% stenosed.    Left Circumflex  The circumflex has critical stenosis as it originates from the left main with severe eccentric heavily calcified plaque with a spiral filling pattern. There are 2 marginal branches with mild nonobstructive plaque that would be likely suitable for bypass grafting.  Ost Cx to Prox Cx lesion is 95% stenosed.    Right Coronary Artery  There is mild diffuse disease throughout the vessel. Dominant vessel, mild diffuse plaquing throughout the proximal, mid, and distal segments. The PDA has a 50% proximal stenosis that does not appear flow-limiting.    Right Posterior Descending Artery  RPDA lesion is 50% stenosed.    Intervention   No interventions have been documented.   Wall Motion              Left Heart  Left Ventricle The left ventricular size is normal. The left ventricular systolic function is normal. LV end diastolic pressure is normal. The left ventricular ejection fraction is 55-65% by visual estimate. No regional wall motion abnormalities.   Coronary Diagrams  Diagnostic Dominance: Right   Recent Radiology Findings:   Preop vascular studies and the echocardiogram are pending.   I have independently reviewed the above radiologic studies and discussed with the patient   Recent Lab Findings: Lab Results  Component Value Date   WBC 3.9 10/29/2023   HGB 13.6 10/29/2023   HCT 39.8 10/29/2023   PLT 133 (L) 10/29/2023   GLUCOSE 137 (H) 10/29/2023   ALT 67 (H) 06/07/2022   AST 63 (H) 06/07/2022   NA 138 10/29/2023   K 4.6 10/29/2023   CL 103  10/29/2023   CREATININE 1.42 (H) 10/29/2023   BUN 13 10/29/2023   CO2 23 10/29/2023   INR 1.1 12/02/2021      Assessment / Plan:      -Coronary artery disease with crescendo angina: Left heart catheterization demonstrates high-grade stenoses in the proximal LAD and circumflex coronary arteries he has preserved LV function.  Echocardiogram is pending.  We agree that coronary bypass grafting is his best option for revascularization.  The procedure and expected perioperative course were discussed with David Jarvis and his wife in detail and he would like for [place] to proceed with preoperative assessment and planning.  He is tentatively scheduled for coronary bypass grafting by Dr. Dorris Fetch on Thursday, 11/06/2023.  -Stage Ib pancreatic adenocarcinoma status post Whipple procedure and subsequent chemotherapy.  He has close follow-up with his oncologist, Dr. Thornton Papas, and has had no evidence of recurrent or metastatic disease.  -Hypertension-treated with valsartan prior to this admission but was noted to be relatively hypotensive recently and was switched to low-dose metoprolol XL.  -Dyslipidemia: On atorvastatin.  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  Recheck BMP tomorrow after contrast exposure.   I  spent 25 minutes counseling the patient face to face.   Leary Roca, PA-C  11/04/2023 12:09 PM

## 2023-11-05 ENCOUNTER — Inpatient Hospital Stay (HOSPITAL_COMMUNITY)

## 2023-11-05 ENCOUNTER — Encounter (HOSPITAL_COMMUNITY): Payer: Self-pay | Admitting: Cardiovascular Disease

## 2023-11-05 DIAGNOSIS — Z01810 Encounter for preprocedural cardiovascular examination: Secondary | ICD-10-CM | POA: Diagnosis not present

## 2023-11-05 DIAGNOSIS — I119 Hypertensive heart disease without heart failure: Secondary | ICD-10-CM | POA: Diagnosis not present

## 2023-11-05 DIAGNOSIS — I25110 Atherosclerotic heart disease of native coronary artery with unstable angina pectoris: Secondary | ICD-10-CM | POA: Diagnosis not present

## 2023-11-05 LAB — BASIC METABOLIC PANEL
Anion gap: 7 (ref 5–15)
BUN: 11 mg/dL (ref 8–23)
CO2: 25 mmol/L (ref 22–32)
Calcium: 7.9 mg/dL — ABNORMAL LOW (ref 8.9–10.3)
Chloride: 107 mmol/L (ref 98–111)
Creatinine, Ser: 1.12 mg/dL (ref 0.61–1.24)
GFR, Estimated: >60 mL/min (ref >60)
Glucose, Bld: 118 mg/dL — ABNORMAL HIGH (ref 70–99)
Potassium: 3.9 mmol/L (ref 3.5–5.1)
Sodium: 139 mmol/L (ref 135–145)

## 2023-11-05 LAB — ABO/RH: ABO/RH(D): O NEG

## 2023-11-05 LAB — PROTIME-INR
INR: 1.3 — ABNORMAL HIGH (ref 0.8–1.2)
Prothrombin Time: 16.2 s — ABNORMAL HIGH (ref 11.4–15.2)

## 2023-11-05 LAB — CBC
HCT: 35.5 % — ABNORMAL LOW (ref 39.0–52.0)
Hemoglobin: 12 g/dL — ABNORMAL LOW (ref 13.0–17.0)
MCH: 30.2 pg (ref 26.0–34.0)
MCHC: 33.8 g/dL (ref 30.0–36.0)
MCV: 89.4 fL (ref 80.0–100.0)
Platelets: 131 10*3/uL — ABNORMAL LOW (ref 150–400)
RBC: 3.97 MIL/uL — ABNORMAL LOW (ref 4.22–5.81)
RDW: 13.1 % (ref 11.5–15.5)
WBC: 3.8 10*3/uL — ABNORMAL LOW (ref 4.0–10.5)
nRBC: 0 % (ref 0.0–0.2)

## 2023-11-05 LAB — URINALYSIS, ROUTINE W REFLEX MICROSCOPIC
Bilirubin Urine: NEGATIVE
Glucose, UA: NEGATIVE mg/dL
Hgb urine dipstick: NEGATIVE
Ketones, ur: NEGATIVE mg/dL
Leukocytes,Ua: NEGATIVE
Nitrite: NEGATIVE
Protein, ur: NEGATIVE mg/dL
Specific Gravity, Urine: 1.017 (ref 1.005–1.030)
pH: 5 (ref 5.0–8.0)

## 2023-11-05 LAB — BLOOD GAS, ARTERIAL
Acid-Base Excess: 4.3 mmol/L — ABNORMAL HIGH (ref 0.0–2.0)
Bicarbonate: 27.1 mmol/L (ref 20.0–28.0)
Drawn by: 53527
O2 Saturation: 96.2 %
Patient temperature: 36.7
pCO2 arterial: 34 mmHg (ref 32–48)
pH, Arterial: 7.52 — ABNORMAL HIGH (ref 7.35–7.45)
pO2, Arterial: 110 mmHg — ABNORMAL HIGH (ref 83–108)

## 2023-11-05 LAB — HEMOGLOBIN A1C
Hgb A1c MFr Bld: 5.2 % (ref 4.8–5.6)
Mean Plasma Glucose: 102.54 mg/dL

## 2023-11-05 LAB — SURGICAL PCR SCREEN
MRSA, PCR: NEGATIVE
Staphylococcus aureus: NEGATIVE

## 2023-11-05 LAB — VAS US DOPPLER PRE CABG

## 2023-11-05 LAB — HEPARIN LEVEL (UNFRACTIONATED)
Heparin Unfractionated: 0.18 [IU]/mL — ABNORMAL LOW (ref 0.30–0.70)
Heparin Unfractionated: 0.47 [IU]/mL (ref 0.30–0.70)

## 2023-11-05 LAB — APTT: aPTT: 112 s — ABNORMAL HIGH (ref 24–36)

## 2023-11-05 MED ORDER — POTASSIUM CHLORIDE 2 MEQ/ML IV SOLN
80.0000 meq | INTRAVENOUS | Status: DC
  Filled 2023-11-05: qty 40

## 2023-11-05 MED ORDER — NITROGLYCERIN IN D5W 200-5 MCG/ML-% IV SOLN
2.0000 ug/min | INTRAVENOUS | Status: DC
  Filled 2023-11-05: qty 250

## 2023-11-05 MED ORDER — CHLORHEXIDINE GLUCONATE 0.12 % MT SOLN
15.0000 mL | Freq: Once | OROMUCOSAL | Status: AC
  Administered 2023-11-06: 15 mL via OROMUCOSAL
  Filled 2023-11-05: qty 15

## 2023-11-05 MED ORDER — DIAZEPAM 2 MG PO TABS
2.0000 mg | ORAL_TABLET | Freq: Once | ORAL | Status: AC
  Administered 2023-11-06: 2 mg via ORAL
  Filled 2023-11-05: qty 1

## 2023-11-05 MED ORDER — EPINEPHRINE HCL 5 MG/250ML IV SOLN IN NS
0.0000 ug/min | INTRAVENOUS | Status: DC
  Filled 2023-11-05: qty 250

## 2023-11-05 MED ORDER — TRANEXAMIC ACID (OHS) PUMP PRIME SOLUTION
2.0000 mg/kg | INTRAVENOUS | Status: DC
  Filled 2023-11-05: qty 1.27

## 2023-11-05 MED ORDER — METOPROLOL TARTRATE 12.5 MG HALF TABLET
12.5000 mg | ORAL_TABLET | Freq: Once | ORAL | Status: AC
  Administered 2023-11-06: 12.5 mg via ORAL
  Filled 2023-11-05: qty 1

## 2023-11-05 MED ORDER — CHLORHEXIDINE GLUCONATE CLOTH 2 % EX PADS
6.0000 | MEDICATED_PAD | Freq: Once | CUTANEOUS | Status: AC
  Administered 2023-11-06: 6 via TOPICAL

## 2023-11-05 MED ORDER — MAGNESIUM SULFATE 50 % IJ SOLN
40.0000 meq | INTRAMUSCULAR | Status: DC
  Filled 2023-11-05: qty 9.85

## 2023-11-05 MED ORDER — DEXMEDETOMIDINE HCL IN NACL 400 MCG/100ML IV SOLN
0.1000 ug/kg/h | INTRAVENOUS | Status: AC
  Administered 2023-11-06: .4 ug/kg/h via INTRAVENOUS
  Filled 2023-11-05: qty 100

## 2023-11-05 MED ORDER — MILRINONE LACTATE IN DEXTROSE 20-5 MG/100ML-% IV SOLN
0.3000 ug/kg/min | INTRAVENOUS | Status: DC
  Filled 2023-11-05: qty 100

## 2023-11-05 MED ORDER — TRANEXAMIC ACID (OHS) BOLUS VIA INFUSION
15.0000 mg/kg | INTRAVENOUS | Status: AC
  Administered 2023-11-06: 952.5 mg via INTRAVENOUS
  Filled 2023-11-05: qty 953

## 2023-11-05 MED ORDER — PLASMA-LYTE A IV SOLN
INTRAVENOUS | Status: DC
  Filled 2023-11-05: qty 2.5

## 2023-11-05 MED ORDER — CEFAZOLIN SODIUM-DEXTROSE 2-4 GM/100ML-% IV SOLN
2.0000 g | INTRAVENOUS | Status: AC
  Administered 2023-11-06: 2 g via INTRAVENOUS
  Filled 2023-11-05: qty 100

## 2023-11-05 MED ORDER — INSULIN REGULAR(HUMAN) IN NACL 100-0.9 UT/100ML-% IV SOLN
INTRAVENOUS | Status: AC
  Administered 2023-11-06: .7 [IU]/h via INTRAVENOUS
  Filled 2023-11-05: qty 100

## 2023-11-05 MED ORDER — NOREPINEPHRINE 4 MG/250ML-% IV SOLN
0.0000 ug/min | INTRAVENOUS | Status: AC
  Administered 2023-11-06: 2 ug/min via INTRAVENOUS
  Filled 2023-11-05: qty 250

## 2023-11-05 MED ORDER — HEPARIN 30,000 UNITS/1000 ML (OHS) CELLSAVER SOLUTION
Status: DC
  Filled 2023-11-05: qty 1000

## 2023-11-05 MED ORDER — CHLORHEXIDINE GLUCONATE CLOTH 2 % EX PADS
6.0000 | MEDICATED_PAD | Freq: Once | CUTANEOUS | Status: AC
  Administered 2023-11-05: 6 via TOPICAL

## 2023-11-05 MED ORDER — VANCOMYCIN HCL 1250 MG/250ML IV SOLN
1250.0000 mg | INTRAVENOUS | Status: AC
  Administered 2023-11-06: 1250 mg via INTRAVENOUS
  Filled 2023-11-05: qty 250

## 2023-11-05 MED ORDER — BISACODYL 5 MG PO TBEC: 5.0000 mg | DELAYED_RELEASE_TABLET | Freq: Once | ORAL | Status: DC

## 2023-11-05 MED ORDER — PHENYLEPHRINE HCL-NACL 20-0.9 MG/250ML-% IV SOLN
30.0000 ug/min | INTRAVENOUS | Status: AC
  Administered 2023-11-06: 25 ug/min via INTRAVENOUS
  Filled 2023-11-05: qty 250

## 2023-11-05 MED ORDER — TRANEXAMIC ACID 1000 MG/10ML IV SOLN
1.5000 mg/kg/h | INTRAVENOUS | Status: AC
  Administered 2023-11-06: 1.5 mg/kg/h via INTRAVENOUS
  Filled 2023-11-05: qty 10

## 2023-11-05 NOTE — Progress Notes (Signed)
 RT collected ABG. RT called lab to notify and sent @1156 .

## 2023-11-05 NOTE — TOC Initial Note (Signed)
 Transition of Care [facility] - Initial/Assessment Note    Patient Details  Name: David Jarvis MRN: [id number] Date of Birth: 1950-12-15  Transition of Care [facility]) CM/SW Contact:    Gala Lewandowsky, RN Phone Number: 11/05/2023, 4:49 PM  Clinical Narrative:  Patient presented for chest pain-post LHC. Plan for CABG 11-06-23. PTA patient was independent from home with spouse. Patient has PCP and insurance. Patient has no DME in the home. Case Manager will continue to follow for additional transition of care needs as the patient progresses.                  Expected Discharge Plan: Home w Home Health Services Barriers to Discharge: Continued Medical Work up  Expected Discharge Plan and Services   Discharge Planning Services: CM Consult   Living arrangements for the past 2 months: Single Family Home  Prior Living Arrangements/Services Living arrangements for the past 2 months: Single Family Home Lives with:: Spouse Patient language and need for interpreter reviewed:: Yes Do you feel safe going back to the place where you live?: Yes      Need for Family Participation in Patient Care: Yes (Comment) Care giver support system in place?: Yes (comment)   Criminal Activity/Legal Involvement Pertinent to Current Situation/Hospitalization: No - Comment as needed  Activities of Daily Living   ADL Screening (condition at time of admission) Independently performs ADLs?: Yes (appropriate for developmental age) Is the patient deaf or have difficulty hearing?: No Does the patient have difficulty seeing, even when wearing glasses/contacts?: No Does the patient have difficulty concentrating, remembering, or making decisions?: No  Permission Sought/Granted Permission sought to share information with : Family Supports, Case Manager   Alcohol / Substance Use: Not Applicable Psych Involvement: No (comment)  Admission diagnosis:  Coronary artery disease involving native coronary artery of  native heart with unstable angina pectoris (HCC) [I25.110] Patient Active Problem List   Diagnosis Date Noted   Coronary artery disease involving native coronary artery of native heart with unstable angina pectoris (HCC) 11/04/2023   Coronary artery disease of native artery of native heart with stable angina pectoris (HCC) 10/30/2023   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 [place] [address] Phone: [phone] Fax: [phone]  Social Drivers of Health (SDOH) Social History: SDOH Screenings   Food Insecurity: No Food Insecurity (11/04/2023)  Housing: Low Risk  (11/04/2023)  Transportation Needs: No Transportation Needs (11/04/2023)  Utilities: Not At Risk (11/04/2023)  Financial Resource Strain: Low Risk  (08/21/2022)   Received from [facility], [facility]  Social Connections: Socially Integrated (11/05/2023)  Tobacco Use: Low Risk  (11/05/2023)   Readmission Risk Interventions     No data to display

## 2023-11-05 NOTE — Progress Notes (Signed)
 PHARMACY - ANTICOAGULATION CONSULT NOTE  Pharmacy Consult for Heparin Indication: chest pain/ACS  Allergies  Allergen Reactions   Ace Inhibitors Cough   Lisinopril Cough   Lisinopril-Hydrochlorothiazide Cough    Patient Measurements: Height: 5\' 6"  (167.6 cm) Weight: 63.5 kg (140 lb) IBW/kg (Calculated) : 63.8 Heparin Dosing Weight: 63.5kg  Vital Signs: Temp: 98 F (36.7 C) (03/05 0825) Temp Source: Oral (03/05 0825) BP: 138/75 (03/05 0825) Pulse Rate: 63 (03/05 0825)  Labs: Recent Labs    11/05/23 0146 11/05/23 1134  HGB 12.0*  --   HCT 35.5*  --   PLT 131*  --   HEPARINUNFRC 0.18* 0.47  CREATININE 1.12  --     Estimated Creatinine Clearance: 54.3 mL/min (by C-G formula based on SCr of 1.12 mg/dL).  Assessment: 72YOM who presented with exertional chest discomfort. Underwent LHC which revealed proximal LAD and left circumflex stenosis. Pharmacy dosing heparin.   -Plans are noted for CABG 3/5 -heparin level= 0.47 (at goal) on 1000 units/hr  Goal of Therapy:  Heparin level 0.3-0.7 units/ml Monitor platelets by anticoagulation protocol: Yes   Plan:  -Continue heparin at 1000 units/hr -Will confirm heparin level later today -Plans for CABG on 3/6  Harland German, PharmD Clinical Pharmacist **Pharmacist phone directory can now be found on [email or web address] ([email or web address]).  Listed under [facility].

## 2023-11-05 NOTE — Progress Notes (Signed)
 PHARMACY - ANTICOAGULATION CONSULT NOTE  Pharmacy Consult for Heparin Indication: chest pain/ACS  Allergies  Allergen Reactions   Ace Inhibitors Cough   Lisinopril Cough   Lisinopril-Hydrochlorothiazide Cough    Patient Measurements: Height: 5\' 6"  (167.6 cm) Weight: 63.5 kg (140 lb) IBW/kg (Calculated) : 63.8 Heparin Dosing Weight: 63.5kg  Vital Signs: Temp: 98.4 F (36.9 C) (03/04 1939) Temp Source: Oral (03/04 1939) BP: 129/81 (03/04 1939) Pulse Rate: 56 (03/04 1939)  Labs: Recent Labs    11/05/23 0146  HGB 12.0*  HCT 35.5*  PLT 131*  HEPARINUNFRC 0.18*    Estimated Creatinine Clearance: 42.9 mL/min (A) (by C-G formula based on SCr of 1.42 mg/dL (H)).  Assessment: 72YOM who presented with exertional chest discomfort. Underwent LHC which revealed proximal LAD and left circumflex stenosis. Not on AC PTA. Pharmacy has been consulted to start heparin 8 hours post sheath removal.  -heparin level returned subtherapeutic on 800 units/hr. No signs/symptoms of bleeding or issues with heparin infusion running continuously. Hgb 12, plts 131  Goal of Therapy:  Heparin level 0.3-0.7 units/ml Monitor platelets by anticoagulation protocol: Yes   Plan:  Increase heparin infusion at 1000 units/hr  Check anti-Xa level in 8 hours and daily while on heparin Continue to monitor H&H and platelets F/u post-CABG plans 3/6  Arabella Merles, PharmD. Clinical Pharmacist 11/05/2023 2:26 AM

## 2023-11-05 NOTE — Anesthesia Preprocedure Evaluation (Signed)
 Anesthesia Evaluation  Patient identified by MRN, date of birth, ID band Patient awake    Reviewed: Allergy & Precautions, NPO status , Patient's Chart, lab work & pertinent test results  Airway Mallampati: II  TM Distance: >3 FB Neck ROM: Full    Dental  (+) Teeth Intact, Dental Advisory Given   Pulmonary neg pulmonary ROS   Pulmonary exam normal breath sounds clear to auscultation       Cardiovascular hypertension, Pt. on home beta blockers + CAD  Normal cardiovascular exam Rhythm:Regular Rate:Normal  Echo 11/04/23:  1. Left ventricular ejection fraction, by estimation, is 50 to 55%. The  left ventricle has low normal function. The left ventricle demonstrates  regional wall motion abnormalities (see scoring diagram/findings for  description). Left ventricular diastolic   parameters were normal.   2. Right ventricular systolic function is normal. The right ventricular  size is normal. There is normal pulmonary artery systolic pressure. The  estimated right ventricular systolic pressure is 33.9 mmHg.   3. The mitral valve is grossly normal. Trivial mitral valve  regurgitation. No evidence of mitral stenosis.   4. The aortic valve is tricuspid. There is mild calcification of the  aortic valve. Aortic valve regurgitation is mild. Aortic valve sclerosis  is present, with no evidence of aortic valve stenosis.   5. The inferior vena cava is normal in size with greater than 50%  respiratory variability, suggesting right atrial pressure of 3 mmHg.     Neuro/Psych negative neurological ROS     GI/Hepatic Neg liver ROS,GERD  Medicated,,  Endo/Other  Hypothyroidism    Renal/GU negative Renal ROS     Musculoskeletal negative musculoskeletal ROS (+)    Abdominal   Peds  Hematology negative hematology ROS (+)   Anesthesia Other Findings   Reproductive/Obstetrics                              Anesthesia Physical Anesthesia Plan  ASA: 4  Anesthesia Plan: General   Post-op Pain Management:    Induction: Intravenous  PONV Risk Score and Plan: 2 and Midazolam and Treatment may vary due to age or medical condition  Airway Management Planned: Oral ETT  Additional Equipment: Arterial line, CVP, PA Cath, TEE and Ultrasound Guidance Line Placement  Intra-op Plan:   Post-operative Plan: Post-operative intubation/ventilation  Informed Consent: I have reviewed the patients History and Physical, chart, labs and discussed the procedure including the risks, benefits and alternatives for the proposed anesthesia with the patient or authorized representative who has indicated his/her understanding and acceptance.     Dental advisory given  Plan Discussed with: CRNA  Anesthesia Plan Comments:         Anesthesia Quick Evaluation

## 2023-11-05 NOTE — Progress Notes (Signed)
   Patient Name: David Jarvis Date of Encounter: 11/05/2023 [facility] Cardiologist: Charene Mccallister  Interval Summary  .    Walked in hallway without angina.  Vital Signs .    Vitals:   11/04/23 1500 11/04/23 1939 11/05/23 0449 11/05/23 0825  BP: (!) 148/80 129/81 118/73 138/75  Pulse:  (!) 56 (!) 48 63  Resp:  14  18  Temp:  98.4 F (36.9 C) 98.5 F (36.9 C) 98 F (36.7 C)  TempSrc:  Oral Oral Oral  SpO2:  97% 98% 96%  Weight:      Height:        Intake/Output Summary (Last 24 hours) at 11/05/2023 1212 Last data filed at 11/05/2023 0826 Gross per 24 hour  Intake 505.96 ml  Output 875 ml  Net -369.04 ml      11/04/2023    7:52 AM 10/29/2023    2:35 PM 10/14/2023    9:40 AM  Last 3 Weights  Weight (lbs) 140 lb 142 lb 12.8 oz 146 lb 8 oz  Weight (kg) 63.504 kg 64.774 kg 66.452 kg      Telemetry/ECG    Sinus bradycardia - Personally Reviewed  Physical Exam .   GEN: No acute distress.   Neck: No JVD Cardiac: RRR, no murmurs, rubs, or gallops.  Respiratory: Clear to auscultation bilaterally. GI: Soft, nontender, non-distended  MS: No edema  Assessment & Plan .     Completing workup for CABG tomorrow. Discussed probable postop course, answered questions.  For questions or updates, please contact [facility] Please consult [email or web address] for contact info under        Signed, Thurmon Fair, MD

## 2023-11-05 NOTE — Progress Notes (Signed)
 Pt has been ambulating. Moved out of bed following sternal precautions with ease. Discussed IS (2500 ml), sternal precautions, mobility, and d/c planning. Thankful for information. Gave [facility] booklet and careguide. Also gave preop video to view. His wife will be with him at d/c.  [id number] David Jarvis BS, ACSM-CEP 11/05/2023 10:17 AM

## 2023-11-05 NOTE — Progress Notes (Signed)
 1 Day Post-Op Procedure(s) (LRB): LEFT HEART CATH AND CORONARY ANGIOGRAPHY (N/A) Subjective: No complaints.  Denies chest pain and shortness of breath.  Objective: Vital signs in last 24 hours: Temp:  [98 F (36.7 C)-98.5 F (36.9 C)] 98 F (36.7 C) (03/05 0825) Pulse Rate:  [48-63] 63 (03/05 0825) Cardiac Rhythm: Sinus bradycardia (03/05 0758) Resp:  [14-18] 18 (03/05 0825) BP: (118-138)/(73-81) 138/75 (03/05 0825) SpO2:  [96 %-98 %] 96 % (03/05 0825)  Hemodynamic parameters for last 24 hours:    Intake/Output from previous day: 03/04 0701 - 03/05 0700 In: 266 [P.O.:240; I.V.:26] Out: 550 [Urine:550] Intake/Output this shift: Total I/O In: 240 [P.O.:240] Out: 325 [Urine:325]  General appearance: alert, cooperative, and no distress Neurologic: intact Heart: regular rate and rhythm Lungs: clear to auscultation bilaterally Extremities: No edema  Lab Results: Recent Labs    11/05/23 0146  WBC 3.8*  HGB 12.0*  HCT 35.5*  PLT 131*   BMET:  Recent Labs    11/05/23 0146  NA 139  K 3.9  CL 107  CO2 25  GLUCOSE 118*  BUN 11  CREATININE 1.12  CALCIUM 7.9*    PT/INR:  Recent Labs    11/05/23 1134  LABPROT 16.2*  INR 1.3*   ABG    Component Value Date/Time   PHART 7.52 (H) 11/05/2023 1153   HCO3 27.1 11/05/2023 1153   O2SAT 96.2 11/05/2023 1153   CBG (last 3)  No results for input(s): "GLUCAP" in the last 72 hours.  Assessment/Plan: S/P Procedure(s) (LRB): LEFT HEART CATH AND CORONARY ANGIOGRAPHY (N/A) 73 year old man who presented with accelerating angina and was found to have severe two-vessel disease not amenable to percutaneous intervention.  Coronary artery bypass grafting is indicated for survival benefit and relief of symptoms.  I discussed the proposed operative procedure with Mr. and Mrs. Valko.  I informed them of the general nature of the procedure including the need for general anesthesia, the incisions to be used, the use of  cardiopulmonary bypass, the use of temporary pacemaker wires and drainage tubes postoperatively, the expected hospital stay, and the postoperative recovery.  I informed them of the indications, risks, benefits, and alternatives.  They understand the risks include, but not limited to death, MI, stroke, DVT, PE, bleeding, possible need for transfusion, infection, cardiac arrhythmias, respiratory renal failure, as well as possibility of other unforeseeable complications.  He understands and accepts the risks and agrees to proceed.  For CABG tomorrow morning  All questions answered.  LOS: 1 day    Loreli Slot 11/05/2023

## 2023-11-06 ENCOUNTER — Inpatient Hospital Stay (HOSPITAL_COMMUNITY)

## 2023-11-06 ENCOUNTER — Other Ambulatory Visit: Payer: Self-pay

## 2023-11-06 ENCOUNTER — Inpatient Hospital Stay (HOSPITAL_COMMUNITY): Payer: Self-pay

## 2023-11-06 ENCOUNTER — Inpatient Hospital Stay (HOSPITAL_COMMUNITY)
Admission: AD | Disposition: A | Discharge status: Home / Self Care | Payer: Self-pay | Source: Home / Self Care | Attending: Cardiovascular Disease

## 2023-11-06 DIAGNOSIS — I10 Essential (primary) hypertension: Secondary | ICD-10-CM | POA: Diagnosis not present

## 2023-11-06 DIAGNOSIS — Z951 Presence of aortocoronary bypass graft: Secondary | ICD-10-CM

## 2023-11-06 DIAGNOSIS — I25110 Atherosclerotic heart disease of native coronary artery with unstable angina pectoris: Secondary | ICD-10-CM

## 2023-11-06 DIAGNOSIS — E039 Hypothyroidism, unspecified: Secondary | ICD-10-CM | POA: Diagnosis not present

## 2023-11-06 DIAGNOSIS — I2510 Atherosclerotic heart disease of native coronary artery without angina pectoris: Secondary | ICD-10-CM

## 2023-11-06 HISTORY — PX: CORONARY ARTERY BYPASS GRAFT: SHX141

## 2023-11-06 HISTORY — PX: TEE WITHOUT CARDIOVERSION: SHX5443

## 2023-11-06 LAB — POCT I-STAT, CHEM 8
BUN: 10 mg/dL (ref 8–23)
BUN: 7 mg/dL — ABNORMAL LOW (ref 8–23)
BUN: 9 mg/dL (ref 8–23)
BUN: 8 mg/dL (ref 8–23)
BUN: 9 mg/dL (ref 8–23)
BUN: 8 mg/dL (ref 8–23)
BUN: 8 mg/dL (ref 8–23)
Calcium, Ion: 1.17 mmol/L (ref 1.15–1.40)
Calcium, Ion: 0.89 mmol/L — CL (ref 1.15–1.40)
Calcium, Ion: 1.17 mmol/L (ref 1.15–1.40)
Calcium, Ion: 0.9 mmol/L — ABNORMAL LOW (ref 1.15–1.40)
Calcium, Ion: 0.94 mmol/L — ABNORMAL LOW (ref 1.15–1.40)
Calcium, Ion: 0.88 mmol/L — CL (ref 1.15–1.40)
Calcium, Ion: 0.93 mmol/L — ABNORMAL LOW (ref 1.15–1.40)
Chloride: 104 mmol/L (ref 98–111)
Chloride: 101 mmol/L (ref 98–111)
Chloride: 104 mmol/L (ref 98–111)
Chloride: 103 mmol/L (ref 98–111)
Chloride: 104 mmol/L (ref 98–111)
Chloride: 103 mmol/L (ref 98–111)
Chloride: 103 mmol/L (ref 98–111)
Creatinine, Ser: 0.9 mg/dL (ref 0.61–1.24)
Creatinine, Ser: 0.7 mg/dL (ref 0.61–1.24)
Creatinine, Ser: 0.9 mg/dL (ref 0.61–1.24)
Creatinine, Ser: 0.7 mg/dL (ref 0.61–1.24)
Creatinine, Ser: 0.7 mg/dL (ref 0.61–1.24)
Creatinine, Ser: 0.7 mg/dL (ref 0.61–1.24)
Creatinine, Ser: 0.8 mg/dL (ref 0.61–1.24)
Glucose, Bld: 111 mg/dL — ABNORMAL HIGH (ref 70–99)
Glucose, Bld: 106 mg/dL — ABNORMAL HIGH (ref 70–99)
Glucose, Bld: 137 mg/dL — ABNORMAL HIGH (ref 70–99)
Glucose, Bld: 136 mg/dL — ABNORMAL HIGH (ref 70–99)
Glucose, Bld: 141 mg/dL — ABNORMAL HIGH (ref 70–99)
Glucose, Bld: 140 mg/dL — ABNORMAL HIGH (ref 70–99)
Glucose, Bld: 244 mg/dL — ABNORMAL HIGH (ref 70–99)
HCT: 36 % — ABNORMAL LOW (ref 39.0–52.0)
HCT: 23 % — ABNORMAL LOW (ref 39.0–52.0)
HCT: 32 % — ABNORMAL LOW (ref 39.0–52.0)
HCT: 25 % — ABNORMAL LOW (ref 39.0–52.0)
HCT: 26 % — ABNORMAL LOW (ref 39.0–52.0)
HCT: 23 % — ABNORMAL LOW (ref 39.0–52.0)
HCT: 25 % — ABNORMAL LOW (ref 39.0–52.0)
Hemoglobin: 12.2 g/dL — ABNORMAL LOW (ref 13.0–17.0)
Hemoglobin: 10.9 g/dL — ABNORMAL LOW (ref 13.0–17.0)
Hemoglobin: 7.8 g/dL — ABNORMAL LOW (ref 13.0–17.0)
Hemoglobin: 8.5 g/dL — ABNORMAL LOW (ref 13.0–17.0)
Hemoglobin: 8.8 g/dL — ABNORMAL LOW (ref 13.0–17.0)
Hemoglobin: 7.8 g/dL — ABNORMAL LOW (ref 13.0–17.0)
Hemoglobin: 8.5 g/dL — ABNORMAL LOW (ref 13.0–17.0)
Potassium: 3.7 mmol/L (ref 3.5–5.1)
Potassium: 3.8 mmol/L (ref 3.5–5.1)
Potassium: 4.6 mmol/L (ref 3.5–5.1)
Potassium: 5.6 mmol/L — ABNORMAL HIGH (ref 3.5–5.1)
Potassium: 6.2 mmol/L — ABNORMAL HIGH (ref 3.5–5.1)
Potassium: 5.2 mmol/L — ABNORMAL HIGH (ref 3.5–5.1)
Potassium: 6.4 mmol/L (ref 3.5–5.1)
Sodium: 143 mmol/L (ref 135–145)
Sodium: 140 mmol/L (ref 135–145)
Sodium: 142 mmol/L (ref 135–145)
Sodium: 140 mmol/L (ref 135–145)
Sodium: 140 mmol/L (ref 135–145)
Sodium: 139 mmol/L (ref 135–145)
Sodium: 140 mmol/L (ref 135–145)
TCO2: 25 mmol/L (ref 22–32)
TCO2: 26 mmol/L (ref 22–32)
TCO2: 27 mmol/L (ref 22–32)
TCO2: 26 mmol/L (ref 22–32)
TCO2: 27 mmol/L (ref 22–32)
TCO2: 25 mmol/L (ref 22–32)
TCO2: 26 mmol/L (ref 22–32)

## 2023-11-06 LAB — POCT I-STAT 7, (LYTES, BLD GAS, ICA,H+H)
Acid-Base Excess: 0 mmol/L (ref 0.0–2.0)
Acid-base deficit: 2 mmol/L (ref 0.0–2.0)
Acid-base deficit: 1 mmol/L (ref 0.0–2.0)
Acid-base deficit: 3 mmol/L — ABNORMAL HIGH (ref 0.0–2.0)
Acid-base deficit: 2 mmol/L (ref 0.0–2.0)
Bicarbonate: 23.2 mmol/L (ref 20.0–28.0)
Bicarbonate: 24.8 mmol/L (ref 20.0–28.0)
Bicarbonate: 23.7 mmol/L (ref 20.0–28.0)
Bicarbonate: 23 mmol/L (ref 20.0–28.0)
Bicarbonate: 20.9 mmol/L (ref 20.0–28.0)
Calcium, Ion: 1.19 mmol/L (ref 1.15–1.40)
Calcium, Ion: 0.89 mmol/L — CL (ref 1.15–1.40)
Calcium, Ion: 0.94 mmol/L — ABNORMAL LOW (ref 1.15–1.40)
Calcium, Ion: 0.97 mmol/L — ABNORMAL LOW (ref 1.15–1.40)
Calcium, Ion: 0.98 mmol/L — ABNORMAL LOW (ref 1.15–1.40)
HCT: 33 % — ABNORMAL LOW (ref 39.0–52.0)
HCT: 24 % — ABNORMAL LOW (ref 39.0–52.0)
HCT: 25 % — ABNORMAL LOW (ref 39.0–52.0)
HCT: 24 % — ABNORMAL LOW (ref 39.0–52.0)
HCT: 17 % — ABNORMAL LOW (ref 39.0–52.0)
Hemoglobin: 11.2 g/dL — ABNORMAL LOW (ref 13.0–17.0)
Hemoglobin: 8.2 g/dL — ABNORMAL LOW (ref 13.0–17.0)
Hemoglobin: 8.5 g/dL — ABNORMAL LOW (ref 13.0–17.0)
Hemoglobin: 8.2 g/dL — ABNORMAL LOW (ref 13.0–17.0)
Hemoglobin: 5.8 g/dL — CL (ref 13.0–17.0)
O2 Saturation: 100 %
O2 Saturation: 100 %
O2 Saturation: 100 %
O2 Saturation: 99 %
O2 Saturation: 100 %
Patient temperature: 35.4
Patient temperature: 36.7
Potassium: 3.6 mmol/L (ref 3.5–5.1)
Potassium: 4.6 mmol/L (ref 3.5–5.1)
Potassium: 5.2 mmol/L — ABNORMAL HIGH (ref 3.5–5.1)
Potassium: 4.1 mmol/L (ref 3.5–5.1)
Potassium: 3.9 mmol/L (ref 3.5–5.1)
Sodium: 142 mmol/L (ref 135–145)
Sodium: 143 mmol/L (ref 135–145)
Sodium: 140 mmol/L (ref 135–145)
Sodium: 145 mmol/L (ref 135–145)
Sodium: 144 mmol/L (ref 135–145)
TCO2: 24 mmol/L (ref 22–32)
TCO2: 26 mmol/L (ref 22–32)
TCO2: 25 mmol/L (ref 22–32)
TCO2: 24 mmol/L (ref 22–32)
TCO2: 22 mmol/L (ref 22–32)
pCO2 arterial: 40.6 mmHg (ref 32–48)
pCO2 arterial: 39.8 mmHg (ref 32–48)
pCO2 arterial: 39.8 mmHg (ref 32–48)
pCO2 arterial: 39.4 mmHg (ref 32–48)
pCO2 arterial: 27.3 mmHg — ABNORMAL LOW (ref 32–48)
pH, Arterial: 7.364 (ref 7.35–7.45)
pH, Arterial: 7.403 (ref 7.35–7.45)
pH, Arterial: 7.383 (ref 7.35–7.45)
pH, Arterial: 7.366 (ref 7.35–7.45)
pH, Arterial: 7.491 — ABNORMAL HIGH (ref 7.35–7.45)
pO2, Arterial: 407 mmHg — ABNORMAL HIGH (ref 83–108)
pO2, Arterial: 327 mmHg — ABNORMAL HIGH (ref 83–108)
pO2, Arterial: 310 mmHg — ABNORMAL HIGH (ref 83–108)
pO2, Arterial: 153 mmHg — ABNORMAL HIGH (ref 83–108)
pO2, Arterial: 160 mmHg — ABNORMAL HIGH (ref 83–108)

## 2023-11-06 LAB — BASIC METABOLIC PANEL
Anion gap: 10 (ref 5–15)
Anion gap: 8 (ref 5–15)
BUN: 9 mg/dL (ref 8–23)
BUN: 10 mg/dL (ref 8–23)
CO2: 23 mmol/L (ref 22–32)
CO2: 21 mmol/L — ABNORMAL LOW (ref 22–32)
Calcium: 8.2 mg/dL — ABNORMAL LOW (ref 8.9–10.3)
Calcium: 8 mg/dL — ABNORMAL LOW (ref 8.9–10.3)
Chloride: 106 mmol/L (ref 98–111)
Chloride: 112 mmol/L — ABNORMAL HIGH (ref 98–111)
Creatinine, Ser: 1.12 mg/dL (ref 0.61–1.24)
Creatinine, Ser: 0.95 mg/dL (ref 0.61–1.24)
GFR, Estimated: >60 mL/min (ref >60)
GFR, Estimated: >60 mL/min (ref >60)
Glucose, Bld: 125 mg/dL — ABNORMAL HIGH (ref 70–99)
Glucose, Bld: 133 mg/dL — ABNORMAL HIGH (ref 70–99)
Potassium: 3.7 mmol/L (ref 3.5–5.1)
Potassium: 4.4 mmol/L (ref 3.5–5.1)
Sodium: 139 mmol/L (ref 135–145)
Sodium: 141 mmol/L (ref 135–145)

## 2023-11-06 LAB — LIPOPROTEIN A (LPA): Lipoprotein (a): 37.8 nmol/L — ABNORMAL HIGH (ref <75.0)

## 2023-11-06 LAB — HEPARIN LEVEL (UNFRACTIONATED): Heparin Unfractionated: 0.58 [IU]/mL (ref 0.30–0.70)

## 2023-11-06 LAB — CBC
HCT: 37.8 % — ABNORMAL LOW (ref 39.0–52.0)
HCT: 23.8 % — ABNORMAL LOW (ref 39.0–52.0)
HCT: 18.7 % — ABNORMAL LOW (ref 39.0–52.0)
HCT: 21.8 % — ABNORMAL LOW (ref 39.0–52.0)
Hemoglobin: 13 g/dL (ref 13.0–17.0)
Hemoglobin: 8.1 g/dL — ABNORMAL LOW (ref 13.0–17.0)
Hemoglobin: 6.4 g/dL — CL (ref 13.0–17.0)
Hemoglobin: 7.6 g/dL — ABNORMAL LOW (ref 13.0–17.0)
MCH: 30.3 pg (ref 26.0–34.0)
MCH: 30.7 pg (ref 26.0–34.0)
MCH: 30.8 pg (ref 26.0–34.0)
MCH: 31.1 pg (ref 26.0–34.0)
MCHC: 34.4 g/dL (ref 30.0–36.0)
MCHC: 34 g/dL (ref 30.0–36.0)
MCHC: 34.2 g/dL (ref 30.0–36.0)
MCHC: 34.9 g/dL (ref 30.0–36.0)
MCV: 88.1 fL (ref 80.0–100.0)
MCV: 90.2 fL (ref 80.0–100.0)
MCV: 89.9 fL (ref 80.0–100.0)
MCV: 89.3 fL (ref 80.0–100.0)
Platelets: 135 10*3/uL — ABNORMAL LOW (ref 150–400)
Platelets: 82 10*3/uL — ABNORMAL LOW (ref 150–400)
Platelets: 58 10*3/uL — ABNORMAL LOW (ref 150–400)
Platelets: 62 10*3/uL — ABNORMAL LOW (ref 150–400)
RBC: 4.29 MIL/uL (ref 4.22–5.81)
RBC: 2.64 MIL/uL — ABNORMAL LOW (ref 4.22–5.81)
RBC: 2.08 MIL/uL — ABNORMAL LOW (ref 4.22–5.81)
RBC: 2.44 MIL/uL — ABNORMAL LOW (ref 4.22–5.81)
RDW: 13.1 % (ref 11.5–15.5)
RDW: 13.1 % (ref 11.5–15.5)
RDW: 13.2 % (ref 11.5–15.5)
RDW: 13.2 % (ref 11.5–15.5)
WBC: 4 10*3/uL (ref 4.0–10.5)
WBC: 6.9 10*3/uL (ref 4.0–10.5)
WBC: 3.9 10*3/uL — ABNORMAL LOW (ref 4.0–10.5)
WBC: 5.6 10*3/uL (ref 4.0–10.5)
nRBC: 0 % (ref 0.0–0.2)
nRBC: 0 % (ref 0.0–0.2)
nRBC: 0 % (ref 0.0–0.2)
nRBC: 0 % (ref 0.0–0.2)

## 2023-11-06 LAB — PROTIME-INR
INR: 2 — ABNORMAL HIGH (ref 0.8–1.2)
Prothrombin Time: 23 s — ABNORMAL HIGH (ref 11.4–15.2)

## 2023-11-06 LAB — PLATELET COUNT: Platelets: 117 10*3/uL — ABNORMAL LOW (ref 150–400)

## 2023-11-06 LAB — GLUCOSE, CAPILLARY
Glucose-Capillary: 117 mg/dL — ABNORMAL HIGH (ref 70–99)
Glucose-Capillary: 104 mg/dL — ABNORMAL HIGH (ref 70–99)
Glucose-Capillary: 88 mg/dL (ref 70–99)
Glucose-Capillary: 123 mg/dL — ABNORMAL HIGH (ref 70–99)
Glucose-Capillary: 132 mg/dL — ABNORMAL HIGH (ref 70–99)
Glucose-Capillary: 131 mg/dL — ABNORMAL HIGH (ref 70–99)
Glucose-Capillary: 102 mg/dL — ABNORMAL HIGH (ref 70–99)
Glucose-Capillary: 164 mg/dL — ABNORMAL HIGH (ref 70–99)

## 2023-11-06 LAB — LIPID PANEL
Cholesterol: 83 mg/dL (ref 0–200)
HDL: 30 mg/dL — ABNORMAL LOW (ref >40)
LDL Cholesterol: 43 mg/dL (ref 0–99)
Total CHOL/HDL Ratio: 2.8 ratio
Triglycerides: 48 mg/dL (ref <150)
VLDL: 10 mg/dL (ref 0–40)

## 2023-11-06 LAB — HEMOGLOBIN AND HEMATOCRIT, BLOOD
HCT: 24.9 % — ABNORMAL LOW (ref 39.0–52.0)
Hemoglobin: 8.5 g/dL — ABNORMAL LOW (ref 13.0–17.0)

## 2023-11-06 LAB — ECHO INTRAOPERATIVE TEE
Height: 66 in
S' Lateral: 2.6 cm
Weight: 2240 [oz_av]

## 2023-11-06 LAB — PREPARE RBC (CROSSMATCH)

## 2023-11-06 LAB — APTT: aPTT: 32 s (ref 24–36)

## 2023-11-06 SURGERY — CORONARY ARTERY BYPASS GRAFTING (CABG)
Anesthesia: General | Site: Chest

## 2023-11-06 MED ORDER — ALBUMIN HUMAN 5 % IV SOLN: INTRAVENOUS | Status: DC | PRN

## 2023-11-06 MED ORDER — FENTANYL CITRATE (PF) 250 MCG/5ML IJ SOLN
INTRAMUSCULAR | Status: AC
  Filled 2023-11-06: qty 5

## 2023-11-06 MED ORDER — ALBUMIN HUMAN 5 % IV SOLN
250.0000 mL | INTRAVENOUS | Status: DC | PRN
  Administered 2023-11-06 (×4): 12.5 g via INTRAVENOUS
  Filled 2023-11-06 (×5): qty 250

## 2023-11-06 MED ORDER — SODIUM CHLORIDE 0.9% IV SOLUTION: Freq: Once | INTRAVENOUS | Status: AC

## 2023-11-06 MED ORDER — PROPOFOL 10 MG/ML IV BOLUS
INTRAVENOUS | Status: AC
Start: 2023-11-06 — End: ?
  Filled 2023-11-06: qty 20

## 2023-11-06 MED ORDER — ROCURONIUM BROMIDE 10 MG/ML (PF) SYRINGE
PREFILLED_SYRINGE | INTRAVENOUS | Status: DC | PRN
  Administered 2023-11-06: 10 mg via INTRAVENOUS
  Administered 2023-11-06: 50 mg via INTRAVENOUS
  Administered 2023-11-06: 100 mg via INTRAVENOUS

## 2023-11-06 MED ORDER — ACETAMINOPHEN 160 MG/5ML PO SOLN
1000.0000 mg | Freq: Four times a day (QID) | ORAL | Status: DC
  Filled 2023-11-06: qty 40.6

## 2023-11-06 MED ORDER — SODIUM CHLORIDE 0.9 % IV SOLN: INTRAVENOUS | Status: AC

## 2023-11-06 MED ORDER — PHENYLEPHRINE 80 MCG/ML (10ML) SYRINGE FOR IV PUSH (FOR BLOOD PRESSURE SUPPORT)
PREFILLED_SYRINGE | INTRAVENOUS | Status: DC | PRN
  Administered 2023-11-06: 160 ug via INTRAVENOUS
  Administered 2023-11-06: 80 ug via INTRAVENOUS
  Administered 2023-11-06 (×2): 160 ug via INTRAVENOUS
  Administered 2023-11-06 (×5): 80 ug via INTRAVENOUS
  Administered 2023-11-06: 160 ug via INTRAVENOUS

## 2023-11-06 MED ORDER — PROTAMINE SULFATE 10 MG/ML IV SOLN
INTRAVENOUS | Status: AC
  Filled 2023-11-06: qty 25

## 2023-11-06 MED ORDER — CEFAZOLIN SODIUM-DEXTROSE 2-4 GM/100ML-% IV SOLN
2.0000 g | Freq: Three times a day (TID) | INTRAVENOUS | Status: AC
  Administered 2023-11-06 – 2023-11-08 (×6): 2 g via INTRAVENOUS
  Filled 2023-11-06 (×6): qty 100

## 2023-11-06 MED ORDER — SODIUM CHLORIDE 0.9% FLUSH: 3.0000 mL | INTRAVENOUS | Status: DC | PRN

## 2023-11-06 MED ORDER — HEPARIN SODIUM (PORCINE) 1000 UNIT/ML IJ SOLN
INTRAMUSCULAR | Status: AC
  Filled 2023-11-06: qty 1

## 2023-11-06 MED ORDER — HEMOSTATIC AGENTS (NO CHARGE) OPTIME
TOPICAL | Status: DC | PRN
  Administered 2023-11-06: 1 via TOPICAL

## 2023-11-06 MED ORDER — NITROGLYCERIN IN D5W 200-5 MCG/ML-% IV SOLN
0.0000 ug/min | INTRAVENOUS | Status: DC
Start: 2023-11-06 — End: 2023-11-08

## 2023-11-06 MED ORDER — MORPHINE SULFATE (PF) 2 MG/ML IV SOLN
1.0000 mg | INTRAVENOUS | Status: DC | PRN
  Administered 2023-11-06 – 2023-11-07 (×2): 2 mg via INTRAVENOUS
  Filled 2023-11-06 (×2): qty 1

## 2023-11-06 MED ORDER — SODIUM CHLORIDE 0.9% FLUSH
3.0000 mL | Freq: Two times a day (BID) | INTRAVENOUS | Status: DC
  Administered 2023-11-07 (×2): 3 mL via INTRAVENOUS

## 2023-11-06 MED ORDER — SODIUM CHLORIDE 0.9% FLUSH
3.0000 mL | Freq: Two times a day (BID) | INTRAVENOUS | Status: DC
Start: 2023-11-06 — End: 2023-11-08
  Administered 2023-11-06: 10 mL via INTRAVENOUS
  Administered 2023-11-07 – 2023-11-08 (×2): 3 mL via INTRAVENOUS

## 2023-11-06 MED ORDER — PROPOFOL 10 MG/ML IV BOLUS
INTRAVENOUS | Status: DC | PRN
  Administered 2023-11-06: 30 mg via INTRAVENOUS
  Administered 2023-11-06: 100 mg via INTRAVENOUS
  Administered 2023-11-06: 30 mg via INTRAVENOUS

## 2023-11-06 MED ORDER — EPHEDRINE 5 MG/ML INJ
INTRAVENOUS | Status: AC
  Filled 2023-11-06: qty 5

## 2023-11-06 MED ORDER — LACTATED RINGERS IV SOLN: INTRAVENOUS | Status: DC | PRN

## 2023-11-06 MED ORDER — FENTANYL CITRATE (PF) 250 MCG/5ML IJ SOLN
INTRAMUSCULAR | Status: AC
Start: 2023-11-06 — End: ?
  Filled 2023-11-06: qty 5

## 2023-11-06 MED ORDER — DEXTROSE 50 % IV SOLN: 0.0000 mL | INTRAVENOUS | Status: DC | PRN

## 2023-11-06 MED ORDER — MIDAZOLAM HCL (PF) 5 MG/ML IJ SOLN
INTRAMUSCULAR | Status: DC | PRN
  Administered 2023-11-06 (×2): 2 mg via INTRAVENOUS
  Administered 2023-11-06: 1 mg via INTRAVENOUS

## 2023-11-06 MED ORDER — DEXMEDETOMIDINE HCL IN NACL 400 MCG/100ML IV SOLN
0.0000 ug/kg/h | INTRAVENOUS | Status: DC
  Administered 2023-11-06: 0.7 ug/kg/h via INTRAVENOUS

## 2023-11-06 MED ORDER — BISACODYL 5 MG PO TBEC
10.0000 mg | DELAYED_RELEASE_TABLET | Freq: Every day | ORAL | Status: DC
  Administered 2023-11-07 – 2023-11-08 (×2): 10 mg via ORAL
  Filled 2023-11-06 (×2): qty 2

## 2023-11-06 MED ORDER — INSULIN REGULAR(HUMAN) IN NACL 100-0.9 UT/100ML-% IV SOLN
INTRAVENOUS | Status: DC
  Administered 2023-11-06: 3 [IU]/h via INTRAVENOUS

## 2023-11-06 MED ORDER — OXYCODONE HCL 5 MG PO TABS
5.0000 mg | ORAL_TABLET | ORAL | Status: DC | PRN
  Administered 2023-11-06 – 2023-11-07 (×2): 5 mg via ORAL
  Filled 2023-11-06 (×2): qty 1

## 2023-11-06 MED ORDER — SODIUM CHLORIDE 0.9 % IV SOLN: INTRAVENOUS | Status: DC | PRN

## 2023-11-06 MED ORDER — CHLORHEXIDINE GLUCONATE 0.12 % MT SOLN
15.0000 mL | OROMUCOSAL | Status: AC
  Administered 2023-11-06: 15 mL via OROMUCOSAL

## 2023-11-06 MED ORDER — PANTOPRAZOLE SODIUM 40 MG IV SOLR
40.0000 mg | Freq: Every day | INTRAVENOUS | Status: AC
  Administered 2023-11-06 – 2023-11-07 (×2): 40 mg via INTRAVENOUS
  Filled 2023-11-06 (×2): qty 10

## 2023-11-06 MED ORDER — PLASMA-LYTE A IV SOLN
INTRAVENOUS | Status: DC | PRN
  Administered 2023-11-06: 500 mL via INTRAVASCULAR

## 2023-11-06 MED ORDER — EPHEDRINE SULFATE-NACL 50-0.9 MG/10ML-% IV SOSY
PREFILLED_SYRINGE | INTRAVENOUS | Status: DC | PRN
  Administered 2023-11-06 (×5): 5 mg via INTRAVENOUS

## 2023-11-06 MED ORDER — LACTATED RINGERS IV SOLN
INTRAVENOUS | Status: DC | PRN
Start: 2023-11-06 — End: 2023-11-06

## 2023-11-06 MED ORDER — DEXAMETHASONE SODIUM PHOSPHATE 10 MG/ML IJ SOLN
INTRAMUSCULAR | Status: AC
  Filled 2023-11-06: qty 1

## 2023-11-06 MED ORDER — METOPROLOL TARTRATE 25 MG/10 ML ORAL SUSPENSION: 12.5000 mg | Freq: Two times a day (BID) | ORAL | Status: DC

## 2023-11-06 MED ORDER — METOCLOPRAMIDE HCL 5 MG/ML IJ SOLN
10.0000 mg | Freq: Four times a day (QID) | INTRAMUSCULAR | Status: AC
  Administered 2023-11-06 – 2023-11-07 (×6): 10 mg via INTRAVENOUS
  Filled 2023-11-06 (×4): qty 2

## 2023-11-06 MED ORDER — PANTOPRAZOLE SODIUM 40 MG PO TBEC
40.0000 mg | DELAYED_RELEASE_TABLET | Freq: Every day | ORAL | Status: DC
  Administered 2023-11-08 – 2023-11-10 (×3): 40 mg via ORAL
  Filled 2023-11-06 (×3): qty 1

## 2023-11-06 MED ORDER — ACETAMINOPHEN 160 MG/5ML PO SOLN: 650.0000 mg | Freq: Once | ORAL | Status: DC

## 2023-11-06 MED ORDER — METOPROLOL TARTRATE 5 MG/5ML IV SOLN: 2.5000 mg | INTRAVENOUS | Status: DC | PRN

## 2023-11-06 MED ORDER — SODIUM CHLORIDE (PF) 0.9 % IJ SOLN
OROMUCOSAL | Status: DC | PRN
  Administered 2023-11-06 (×3): 4 mL via TOPICAL

## 2023-11-06 MED ORDER — PHENYLEPHRINE HCL (PRESSORS) 10 MG/ML IV SOLN
INTRAVENOUS | Status: AC
  Filled 2023-11-06: qty 1

## 2023-11-06 MED ORDER — DEXTROSE 50 % IV SOLN
INTRAVENOUS | Status: DC | PRN
  Administered 2023-11-06: 12.5 g via INTRAVENOUS

## 2023-11-06 MED ORDER — ROCURONIUM BROMIDE 10 MG/ML (PF) SYRINGE
PREFILLED_SYRINGE | INTRAVENOUS | Status: AC
  Filled 2023-11-06: qty 20

## 2023-11-06 MED ORDER — PHENYLEPHRINE HCL-NACL 20-0.9 MG/250ML-% IV SOLN
0.0000 ug/min | INTRAVENOUS | Status: DC
  Administered 2023-11-06: 35 ug/min via INTRAVENOUS

## 2023-11-06 MED ORDER — DOCUSATE SODIUM 100 MG PO CAPS
200.0000 mg | ORAL_CAPSULE | Freq: Every day | ORAL | Status: DC
  Administered 2023-11-07 – 2023-11-08 (×2): 200 mg via ORAL
  Filled 2023-11-06 (×2): qty 2

## 2023-11-06 MED ORDER — METOPROLOL TARTRATE 12.5 MG HALF TABLET: 12.5000 mg | ORAL_TABLET | Freq: Two times a day (BID) | ORAL | Status: DC

## 2023-11-06 MED ORDER — ASPIRIN 81 MG PO CHEW
324.0000 mg | CHEWABLE_TABLET | Freq: Once | ORAL | Status: AC
  Administered 2023-11-06: 324 mg via ORAL

## 2023-11-06 MED ORDER — VANCOMYCIN HCL IN DEXTROSE 1-5 GM/200ML-% IV SOLN
1000.0000 mg | Freq: Once | INTRAVENOUS | Status: AC
  Administered 2023-11-06: 1000 mg via INTRAVENOUS
  Filled 2023-11-06: qty 200

## 2023-11-06 MED ORDER — ONDANSETRON HCL 4 MG/2ML IJ SOLN
4.0000 mg | Freq: Four times a day (QID) | INTRAMUSCULAR | Status: DC | PRN
  Administered 2023-11-07: 4 mg via INTRAVENOUS
  Filled 2023-11-06: qty 2

## 2023-11-06 MED ORDER — TRAMADOL HCL 50 MG PO TABS
50.0000 mg | ORAL_TABLET | ORAL | Status: DC | PRN
  Administered 2023-11-06: 50 mg via ORAL
  Administered 2023-11-07: 100 mg via ORAL
  Filled 2023-11-06: qty 2
  Filled 2023-11-06: qty 1

## 2023-11-06 MED ORDER — SODIUM CHLORIDE 0.9% FLUSH
3.0000 mL | Freq: Two times a day (BID) | INTRAVENOUS | Status: DC
Start: 2023-11-06 — End: 2023-11-08
  Administered 2023-11-06: 10 mL via INTRAVENOUS
  Administered 2023-11-07: 3 mL via INTRAVENOUS

## 2023-11-06 MED ORDER — BISACODYL 10 MG RE SUPP: 10.0000 mg | Freq: Every day | RECTAL | Status: DC

## 2023-11-06 MED ORDER — CHLORHEXIDINE GLUCONATE CLOTH 2 % EX PADS
6.0000 | MEDICATED_PAD | Freq: Every day | CUTANEOUS | Status: DC
  Administered 2023-11-06 – 2023-11-08 (×3): 6 via TOPICAL

## 2023-11-06 MED ORDER — HEPARIN SODIUM (PORCINE) 1000 UNIT/ML IJ SOLN
INTRAMUSCULAR | Status: DC | PRN
  Administered 2023-11-06: 2000 [IU] via INTRAVENOUS
  Administered 2023-11-06: 20000 [IU] via INTRAVENOUS

## 2023-11-06 MED ORDER — MAGNESIUM SULFATE 4 GM/100ML IV SOLN
4.0000 g | Freq: Once | INTRAVENOUS | Status: AC
  Administered 2023-11-06: 4 g via INTRAVENOUS
  Filled 2023-11-06: qty 100

## 2023-11-06 MED ORDER — PHENYLEPHRINE 80 MCG/ML (10ML) SYRINGE FOR IV PUSH (FOR BLOOD PRESSURE SUPPORT)
PREFILLED_SYRINGE | INTRAVENOUS | Status: AC
  Filled 2023-11-06: qty 10

## 2023-11-06 MED ORDER — 0.9 % SODIUM CHLORIDE (POUR BTL) OPTIME
TOPICAL | Status: DC | PRN
  Administered 2023-11-06: 5000 mL

## 2023-11-06 MED ORDER — NOREPINEPHRINE 4 MG/250ML-% IV SOLN
0.0000 ug/min | INTRAVENOUS | Status: DC
  Administered 2023-11-06: 3 ug/min via INTRAVENOUS

## 2023-11-06 MED ORDER — DEXAMETHASONE SODIUM PHOSPHATE 10 MG/ML IJ SOLN
INTRAMUSCULAR | Status: DC | PRN
Start: 2023-11-06 — End: 2023-11-06
  Administered 2023-11-06: 5 mg via INTRAVENOUS

## 2023-11-06 MED ORDER — MIDAZOLAM HCL 2 MG/2ML IJ SOLN: 2.0000 mg | INTRAMUSCULAR | Status: DC | PRN

## 2023-11-06 MED ORDER — ASPIRIN 81 MG PO CHEW: 324.0000 mg | CHEWABLE_TABLET | Freq: Every day | ORAL | Status: DC

## 2023-11-06 MED ORDER — ASPIRIN 325 MG PO TBEC
325.0000 mg | DELAYED_RELEASE_TABLET | Freq: Every day | ORAL | Status: DC
  Administered 2023-11-07: 325 mg via ORAL
  Filled 2023-11-06: qty 1

## 2023-11-06 MED ORDER — FENTANYL CITRATE (PF) 250 MCG/5ML IJ SOLN
INTRAMUSCULAR | Status: DC | PRN
  Administered 2023-11-06: 150 ug via INTRAVENOUS
  Administered 2023-11-06: 50 ug via INTRAVENOUS
  Administered 2023-11-06: 150 ug via INTRAVENOUS
  Administered 2023-11-06: 100 ug via INTRAVENOUS
  Administered 2023-11-06: 50 ug via INTRAVENOUS
  Administered 2023-11-06: 150 ug via INTRAVENOUS
  Administered 2023-11-06 (×2): 100 ug via INTRAVENOUS
  Administered 2023-11-06: 50 ug via INTRAVENOUS
  Administered 2023-11-06: 150 ug via INTRAVENOUS
  Administered 2023-11-06: 50 ug via INTRAVENOUS
  Administered 2023-11-06: 150 ug via INTRAVENOUS

## 2023-11-06 MED ORDER — ACETAMINOPHEN 500 MG PO TABS
1000.0000 mg | ORAL_TABLET | Freq: Four times a day (QID) | ORAL | Status: DC
  Administered 2023-11-06 – 2023-11-10 (×14): 1000 mg via ORAL
  Filled 2023-11-06 (×14): qty 2

## 2023-11-06 MED ORDER — SODIUM CHLORIDE 0.9% FLUSH
3.0000 mL | Freq: Two times a day (BID) | INTRAVENOUS | Status: DC
  Administered 2023-11-06 (×2): 10 mL via INTRAVENOUS
  Administered 2023-11-07: 3 mL via INTRAVENOUS
  Administered 2023-11-08: 10 mL via INTRAVENOUS

## 2023-11-06 MED ORDER — MIDAZOLAM HCL (PF) 10 MG/2ML IJ SOLN
INTRAMUSCULAR | Status: AC
  Filled 2023-11-06: qty 2

## 2023-11-06 MED ORDER — PROTAMINE SULFATE 10 MG/ML IV SOLN
INTRAVENOUS | Status: DC | PRN
  Administered 2023-11-06: 220 mg via INTRAVENOUS

## 2023-11-06 MED ORDER — POTASSIUM CHLORIDE 10 MEQ/50ML IV SOLN: 10.0000 meq | INTRAVENOUS | Status: AC

## 2023-11-06 SURGICAL SUPPLY — 76 items
BAG DECANTER FOR FLEXI CONT (MISCELLANEOUS) ×1 IMPLANT
BLADE CLIPPER SURG (BLADE) ×1 IMPLANT
BLADE STERNUM SYSTEM 6 (BLADE) ×1 IMPLANT
BNDG ELASTIC 4INX 5YD STR LF (GAUZE/BANDAGES/DRESSINGS) IMPLANT
BNDG ELASTIC 4X5.8 VLCR STR LF (GAUZE/BANDAGES/DRESSINGS) ×1 IMPLANT
BNDG ELASTIC 6INX 5YD STR LF (GAUZE/BANDAGES/DRESSINGS) ×1 IMPLANT
BNDG GAUZE DERMACEA FLUFF 4 (GAUZE/BANDAGES/DRESSINGS) ×1 IMPLANT
CANISTER SUCT 3000ML PPV (MISCELLANEOUS) ×1 IMPLANT
CANNULA EZ GLIDE AORTIC 21FR (CANNULA) ×1 IMPLANT
CANNULA MC2 2 STG 36/46 CONN (CANNULA) IMPLANT
CATH CPB KIT HENDRICKSON (MISCELLANEOUS) ×1 IMPLANT
CATH ROBINSON RED A/P 18FR (CATHETERS) ×1 IMPLANT
CATH THORACIC 36FR (CATHETERS) ×1 IMPLANT
CATH THORACIC 36FR RT ANG (CATHETERS) ×1 IMPLANT
CLIP TI MEDIUM 24 (CLIP) IMPLANT
CLIP TI WIDE RED SMALL 24 (CLIP) IMPLANT
CONTAINER PROTECT SURGISLUSH (MISCELLANEOUS) ×2 IMPLANT
COVER MAYO STAND STRL (DRAPES) IMPLANT
DERMABOND ADVANCED .7 DNX12 (GAUZE/BANDAGES/DRESSINGS) IMPLANT
DRAPE SRG 135X102X78XABS (DRAPES) ×1 IMPLANT
DRAPE WARM FLUID 44X44 (DRAPES) ×1 IMPLANT
DRSG COVADERM 4X14 (GAUZE/BANDAGES/DRESSINGS) ×1 IMPLANT
ELECT REM PT RETURN 9FT ADLT (ELECTROSURGICAL) ×2 IMPLANT
ELECTRODE REM PT RTRN 9FT ADLT (ELECTROSURGICAL) ×2 IMPLANT
FELT TEFLON 1X6 (MISCELLANEOUS) ×2 IMPLANT
GAUZE 4X4 16PLY ~~LOC~~+RFID DBL (SPONGE) ×1 IMPLANT
GAUZE SPONGE 4X4 12PLY STRL (GAUZE/BANDAGES/DRESSINGS) ×2 IMPLANT
GLOVE SS BIOGEL STRL SZ 7.5 (GLOVE) ×1 IMPLANT
GLOVE SURG SIGNA 7.5 PF LTX (GLOVE) ×3 IMPLANT
GOWN STRL REUS W/ TWL LRG LVL3 (GOWN DISPOSABLE) ×4 IMPLANT
GOWN STRL REUS W/ TWL XL LVL3 (GOWN DISPOSABLE) ×2 IMPLANT
HEMOSTAT POWDER SURGIFOAM 1G (HEMOSTASIS) ×3 IMPLANT
HEMOSTAT SURGICEL 2X14 (HEMOSTASIS) ×1 IMPLANT
KIT BASIN OR (CUSTOM PROCEDURE TRAY) ×1 IMPLANT
KIT SUCTION CATH 14FR (SUCTIONS) ×2 IMPLANT
KIT TURNOVER KIT B (KITS) ×1 IMPLANT
KIT VASOVIEW HEMOPRO 2 VH 4000 (KITS) ×1 IMPLANT
MARKER GRAFT CORONARY BYPASS (MISCELLANEOUS) ×3 IMPLANT
NS IRRIG 1000ML POUR BTL (IV SOLUTION) ×5 IMPLANT
PACK E OPEN HEART (SUTURE) ×1 IMPLANT
PACK OPEN HEART (CUSTOM PROCEDURE TRAY) ×1 IMPLANT
PAD ARMBOARD 7.5X6 YLW CONV (MISCELLANEOUS) ×2 IMPLANT
PAD ELECT DEFIB RADIOL ZOLL (MISCELLANEOUS) ×1 IMPLANT
PENCIL BUTTON HOLSTER BLD 10FT (ELECTRODE) ×1 IMPLANT
POSITIONER HEAD DONUT 9IN (MISCELLANEOUS) ×1 IMPLANT
PUNCH AORTIC ROTATE 4.0MM (MISCELLANEOUS) IMPLANT
PUNCH AORTIC ROTATE 4.5MM 8IN (MISCELLANEOUS) IMPLANT
PUNCH AORTIC ROTATE 5MM 8IN (MISCELLANEOUS) IMPLANT
SPONGE T-LAP 18X18 ~~LOC~~+RFID (SPONGE) ×4 IMPLANT
SPONGE T-LAP 4X18 ~~LOC~~+RFID (SPONGE) ×1 IMPLANT
STOPCOCK 4 WAY LG BORE MALE ST (IV SETS) IMPLANT
SUPPORT HEART JANKE-BARRON (MISCELLANEOUS) ×1 IMPLANT
SUT BONE WAX W31G (SUTURE) ×1 IMPLANT
SUT ETHIBOND 2 0 SH 36X2 (SUTURE) IMPLANT
SUT MNCRL AB 4-0 PS2 18 (SUTURE) IMPLANT
SUT PROLENE 4 0 SH DA (SUTURE) IMPLANT
SUT PROLENE 4-0 RB1 .5 CRCL 36 (SUTURE) IMPLANT
SUT PROLENE 5 0 C 1 36 (SUTURE) IMPLANT
SUT PROLENE 6 0 C 1 30 (SUTURE) ×2 IMPLANT
SUT PROLENE 7 0 BV 1 (SUTURE) IMPLANT
SUT PROLENE 7 0 BV1 MDA (SUTURE) ×1 IMPLANT
SUT PROLENE 8 0 BV175 6 (SUTURE) IMPLANT
SUT STEEL 6MS V (SUTURE) ×1 IMPLANT
SUT STEEL STERNAL CCS#1 18IN (SUTURE) IMPLANT
SUT STEEL SZ 6 DBL 3X14 BALL (SUTURE) ×1 IMPLANT
SUT VIC AB 1 CTX36XBRD ANBCTR (SUTURE) ×2 IMPLANT
SUT VIC AB 2-0 CT1 TAPERPNT 27 (SUTURE) IMPLANT
SYSTEM SAHARA CHEST DRAIN ATS (WOUND CARE) ×1 IMPLANT
TAPE CLOTH SURG 4X10 WHT LF (GAUZE/BANDAGES/DRESSINGS) IMPLANT
TAPE PAPER 2X10 WHT MICROPORE (GAUZE/BANDAGES/DRESSINGS) IMPLANT
TOWEL GREEN STERILE (TOWEL DISPOSABLE) ×1 IMPLANT
TOWEL GREEN STERILE FF (TOWEL DISPOSABLE) ×1 IMPLANT
TRAY FOLEY SLVR 16FR TEMP STAT (SET/KITS/TRAYS/PACK) ×1 IMPLANT
TUBING LAP HI FLOW INSUFFLATIO (TUBING) ×1 IMPLANT
UNDERPAD 30X36 HEAVY ABSORB (UNDERPADS AND DIAPERS) ×1 IMPLANT
WATER STERILE IRR 1000ML POUR (IV SOLUTION) ×2 IMPLANT

## 2023-11-06 NOTE — Op Note (Signed)
 NAME: David Jarvis, David Jarvis MEDICAL RECORD NO: [id number] ACCOUNT NO: [id number] DATE OF BIRTH: [birth year] FACILITY: [facility] LOCATION: [facility] PHYSICIAN: Salvatore Decent. Dorris Fetch, MD  Operative Report   DATE OF PROCEDURE: 11/06/2023  PREOPERATIVE DIAGNOSIS:  Severe two-vessel coronary artery disease with accelerating angina.  POSTOPERATIVE DIAGNOSIS:  Severe two-vessel coronary artery disease with accelerating angina.  PROCEDURE:   Median sternotomy, extracorporeal circulation,  Coronary artery bypass grafting x3  Left internal mammary artery to LAD,  Saphenous vein graft to first diagonal,  Saphenous vein graft to obtuse marginal, Endoscopic vein harvest to right thigh.  SURGEON:  Salvatore Decent. Dorris Fetch, MD  ASSISTANT:  Jillyn Hidden, PA  Experienced assistance was necessary for this case due to surgical complexity.  Jillyn Hidden assisted with harvesting the saphenous vein independently as I harvested the mammary artery.  He closed the leg incisions.  He then assisted with exposure, retraction of delicate tissue, suture management, and suctioning during the anastomosis.  ANESTHESIA:  General.  FINDINGS:  Transesophageal echocardiography showed preserved left ventricular function with mild AI and mild MR.  Post bypass unchanged.  OM partially intramyocardial, only 1 OM identifiable.  Coronaries more diffusely diseased than apparent on catheterization.  Good quality saphenous vein.  Good quality left internal mammary artery.  CLINICAL NOTE:  David Jarvis is a 73 year old gentleman with no prior history of coronary artery disease who presented with accelerating chest pain. At catheterization he was found to have severe two-vessel disease including proximal LAD involving a diagonal branch.  It was not amenable to percutaneous intervention.  He was referred for coronary artery bypass grafting.  The indications, risks, benefits, and alternatives were discussed in detail with the patient.  He  understood and accepted the risks and agreed to proceed.  OPERATIVE NOTE:  David Jarvis was brought to the operating room on 11/06/2023.  He had induction of general anesthesia and was intubated.  Intravenous antibiotics were administered.  A Foley catheter was placed in the chest, abdomen, and legs were prepped and draped in the usual sterile fashion.  Dr. Desmond Lope performed transesophageal echocardiography once a machine that was functional was available.  Please see his separately dictated note for full details of the procedure.  A timeout was performed.  An incision was made in the medial aspect of the right leg at the level of the knee.  The greater saphenous vein was identified.  It was harvested endoscopically from the right thigh.  The saphenous vein was a good quality vessel.  Simultaneously, a median sternotomy was performed and the left internal mammary artery was harvested using standard technique.  The mammary was relatively small, but had excellent flow when divided distally.  It did accept a 1.5-mm probe.  2000 units of heparin was administered during the vessel harvest.  The remainder of the full heparin dose was given prior to opening the pericardium.  A sternal retractor was placed and was gradually opened over time.  The pericardium was opened.  The ascending aorta was inspected.  It was 3.9 cm in diameter with no evidence of atherosclerotic disease.  After confirming adequate anticoagulation with ACT measurement, the aorta was cannulated via concentric 2-0 Ethibond pledgetted pursestring sutures.  A dual-stage venous cannula was placed via a pursestring suture in the radial appendage.  Cardiopulmonary bypass was initiated.  Flows were maintained  per protocol.  The patient was cooled to 34 degrees Celsius.  The coronary arteries were inspected and anastomotic sites were chosen.  The conduits were inspected and cut to length.  The foam pad was placed in the pericardium to insulate the heart.  A  temperature probe was placed in the myocardial septum and the cardioplegia cannula was placed in the ascending aorta.  The aorta was cross-clamped.  The left ventricle was emptied via the aortic root vent.  Cardiac arrest was achieved with a combination of cold antegrade blood cardioplegia and topical iced saline.  1.25 liters of cardioplegia was administered.  There was a rapid diastolic arrest and there was septal cooling to 12 degrees Celsius.  The heart was elevated exposing the lateral wall.  OM1 was identified.  It was partially intramyocardial.  An arteriotomy was made.  A probe did pass easily proximally and distally from the arteriotomy.  The saphenous vein was good quality.  It was anastomosed end-to-side with a running 7-0 Prolene suture.  A probe did pass distally.  With both flushing and cardioplegia administration.  There was relative resistance to flow.  Decision was made to redo the graft, but the plan was to do the diagonal vein first.  Additional cardioplegia was administered.  A reversed saphenous vein graft was placed end-to-side of the first diagonal branch of the LAD.  This vessel had more extensive plaque than was evident on catheterization, but the vessel was of good quality at the site of the anastomosis.  The vein was anastomosed end to side with a running 7-0 Prolene suture.  A probe passed easily proximally and distally.  Cardioplegia was administered down the graft and there was good flow and good hemostasis.    The OM exposed again.  The anastomosis was taken down.  The arteriotomy was extended distally.  The vein was once again beveled and an end-to-side anastomosis was performed with a running 7-0 Prolene suture.  A probe passed easily distally.  Cardioplegia was administered down the graft and there was good flow and good hemostasis.  Additional cardioplegia was administered.  The left internal mammary artery was brought through a window in the pericardium.  The distal end  was beveled.  It was anastomosed end-to-side to the distal LAD.  The LAD did have more diffuse disease than was apparent on catheterization, but a probe passed easily proximally and passed distally to the apex.  The LAD was a 1.5 mm good quality target.  The mammary was a 1.5 mm good quality conduit.  An end-to-side anastomosis was performed with a running 8-0 Prolene suture.  At the completion of the anastomosis, the Bulldog clamp was removed from the left mammary artery.  Septal rewarming was noted.  The Bulldog clamp was replaced and the mammary pedicle was tacked to the epicardial surface of the heart with 6-0 Prolene sutures.  The vein grafts were cut to length.  The cardioplegia cannula was removed from the ascending aorta and the proximal vein graft anastomoses were performed to 4.5 mm punch aortotomies with running 6-0 Prolene sutures.  At the completion of the final proximal anastomosis, the patient was placed in Trendelenburg position.  Lidocaine was administered.  The aortic root was de-aired and the aortic cross clamp was removed.  The total cross-clamp time was 86 minutes.  The patient spontaneously resumed sinus rhythm and did not require defibrillation.  While rewarming was completed, all proximal and distal anastomosis were inspected for hemostasis.  Epicardial pacing wires were placed on the right ventricle and right atrium.  When the patient had rewarmed to a core temperature of 37 degrees Celsius, he was weaned from cardiopulmonary bypass on the first attempt.  The total bypass time was 112 minutes.  He was on Neo-Synephrine for vasodilatation and was in sinus rhythm at the time of separation from bypass.  The initial cardiac index was greater than 2 liters per minute per meter squared and he remained hemodynamically stable throughout the post bypass.  Post bypass transesophageal echocardiography initially showed slight increase in the MR, but returned to baseline.  There was preserved left  ventricular systolic function.  A test dose of protamine was administered and was well tolerated.  The atrial and aortic cannulae were removed.  The remainder of the protamine was administered without incident.  The chest was copiously irrigated with saline.  Hemostasis was achieved.  Left pleural and mediastinal chest tubes were placed via separate subcostal incisions.  The pericardium was reapproximated over the ascending aorta.  The sternum was closed with a combination of single and double heavy gauge stainless steel wires.  The pectoralis fascia and subcutaneous tissue and skin were closed in a standard fashion.  All sponge and instrument counts were correct.  There was a missing needle from a 7-0 Prolene suture.  No x-ray was performed as the needle was too small to be visualized with x-ray.  The patient then was transported from the operating room to the surgical intensive care unit intubated and in good condition.   PUS D: 11/06/2023 4:26:50 pm T: 11/06/2023 6:17:00 pm  JOB: [id number]/ [id number]

## 2023-11-06 NOTE — Anesthesia Postprocedure Evaluation (Signed)
 Anesthesia Post Note  Patient: David Jarvis  Procedure(s) Performed: CORONARY ARTERY BYPASS GRAFTING (CABG) TIMES THREE USING LEFT INTERNAL MAMMARY ARTERY AND ENDOSCOPICALLY HARVESTED RIGHT GREATER SAPHENOUS VEIN (Chest) ECHOCARDIOGRAM, TRANSESOPHAGEAL (Chest)     Patient location during evaluation: SICU Anesthesia Type: General Level of consciousness: awake and alert Pain management: pain level controlled Vital Signs Assessment: post-procedure vital signs reviewed and stable Respiratory status: spontaneous breathing, nonlabored ventilation, respiratory function stable and patient connected to nasal cannula oxygen Cardiovascular status: blood pressure returned to baseline and stable Postop Assessment: no apparent nausea or vomiting Anesthetic complications: no   No notable events documented.  Last Vitals:  Vitals:   11/06/23 1600 11/06/23 1615  BP:    Pulse: 70 72  Resp: 14 15  Temp: 37.1 C 37.1 C  SpO2: 100% 100%    Last Pain:  Vitals:   11/06/23 1726  TempSrc:   PainSc: 8                  Collene Schlichter

## 2023-11-06 NOTE — Brief Op Note (Addendum)
 11/04/2023 - 11/06/2023  11:49 AM  PATIENT:  David Jarvis  73 y.o. male  PRE-OPERATIVE DIAGNOSIS:  CORONARY ARTERY DISEASE  POST-OPERATIVE DIAGNOSIS:  CORONARY ARTERY DISEASE  PROCEDURE:   CORONARY ARTERY BYPASS GRAFTING (CABG) TIMES THREE USING LEFT INTERNAL MAMMARY ARTERY AND ENDOSCOPICALLY HARVESTED RIGHT GREATER SAPHENOUS VEIN   LIMA-LAD SVG-D1 SVG-OM   Vein harvest time: Vein prep time:  ECHOCARDIOGRAM, TRANSESOPHAGEAL  SURGEON: Loreli Slot, MD   PHYSICIAN ASSISTANT:  Jillyn Hidden, PA-C  ASSISTANTS: Pesare, Piyanuch, RN, Scrub Person         Ria Bush, RN, Scrub Person   ANESTHESIA:   general  EBL:   BLOOD ADMINISTERED:none  DRAINS:  Mediastinal and left pleural drains    LOCAL MEDICATIONS USED:  NONE  SPECIMEN:  No Specimen  COUNTS:  Correct  DICTATION: .Dragon Dictation  PLAN OF CARE: Admit to inpatient   PATIENT DISPOSITION:  ICU - intubated and hemodynamically stable.   Delay start of Pharmacological VTE agent (>24hrs) due to surgical blood loss or risk of bleeding: yes

## 2023-11-06 NOTE — Procedures (Signed)
 Extubation Procedure Note  Patient Details:   Name: David Jarvis DOB: 1951/08/26 MRN: [id number]   Airway Documentation:    Vent end date: 11/06/23 Vent end time: 1545   Evaluation  O2 sats: stable throughout Complications: No apparent complications Patient did tolerate procedure well. Bilateral Breath Sounds: Clear   Yes  RT extubated patient per MD order and rapid wean protocol with RN at bedside. Positive cuff leak noted. VC 2.10 and NIF -30. No complications, patient tolerated well. No stridor or distress noted at this time. RT will continue to monitor as needed.    Jaquelyn Bitter 11/06/2023, 3:53 PM

## 2023-11-06 NOTE — Hospital Course (Addendum)
 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  He also has a history of hypertension and dyslipidemia.  He was known to have multiple coronary calcifications on surveillance imaging around the time of his cancer treatment. While seeing his oncologist about 1 month ago he mentioned having neck and chest discomfort while biking uphill on his mountain bike over the previous 2 months.   A referral was made to cardiology ane he was evaluated by Dr. Royann Shivers in the office last week. He stated at the time that he he did not have angina when biking on level ground or during spin classes.  Left heart catheterization was recommended and arranged for today.  Prior to the catheterization this morning David Jarvis said that his angina had worsened and was now occurring with lower levels of activity.   Left heart catheterization demonstrated severe calcified stenoses in the proximal LAD and circumflex coronary arteries.  The RCA had minor nonobstructive irregularities.  The left ventriculogram showed normal LV function and the LVEDP was normal.  Due to the recent escalation from stable angina to crescendo angina and the finding of critical coronary lesions, Dr. Excell Seltzer recommended hospital admission today and initiation of IV heparin.  CT surgery has been asked to evaluate David Jarvis for coronary bypass grafting.  Hospital Course: David Jarvis remained stable following left heart catheterization. He was seen by Dr. Dorris Fetch and scheduled for coronary artery bypass grafting.  He was taken to the OR on 11/06/23 where CABG x 3 was accomplished without complication.  Following surgery, he was transferred to the ICU in stable condition. Vital signs and  hemodynamics remained stable. He was extubated within a few hours of arrival to the ICU. He had sinus bradycardia early post-op so the metoprolol was withheld and he was atrially paced using the epicardial pacing wires. The monitoring lines and drainage tubes were removed on the first post-op day and he was mobilized. His epicardial pacing wires were removed without complication. He had expected postop acute blood loss anemia and was started on an iron supplement. The patient was routinely diuresed. He was felt stable for transfer to the progressive unit. He was ambulating well on room air. His bowels began moving on ***. His incisions were healing well without sign of infection. He was felt stable for discharge home on ***.

## 2023-11-06 NOTE — Anesthesia Procedure Notes (Signed)
 Central Venous Catheter Insertion Performed by: Collene Schlichter, MD, anesthesiologist Start/End3/02/2024 6:50 AM, 11/06/2023 7:00 AM Patient location: Pre-op. Preanesthetic checklist: patient identified, IV checked, site marked, risks and benefits discussed, surgical consent, monitors and equipment checked, pre-op evaluation, timeout performed and anesthesia consent Position: Trendelenburg Lidocaine 1% used for infiltration and patient sedated Hand hygiene performed , maximum sterile barriers used  and Seldinger technique used Catheter size: 8.5 Fr Central line was placed.Sheath introducer Procedure performed using ultrasound guided technique. Ultrasound Notes:anatomy identified, needle tip was noted to be adjacent to the nerve/plexus identified, no ultrasound evidence of intravascular and/or intraneural injection and image(s) printed for medical record Attempts: 1 Following insertion, line sutured, dressing applied and Biopatch. Post procedure assessment: free fluid flow, blood return through all ports and no air  Patient tolerated the procedure well with no immediate complications.

## 2023-11-06 NOTE — Transfer of Care (Signed)
 Immediate Anesthesia Transfer of Care Note  Patient: David Jarvis  Procedure(s) Performed: CORONARY ARTERY BYPASS GRAFTING (CABG) TIMES THREE USING LEFT INTERNAL MAMMARY ARTERY AND ENDOSCOPICALLY HARVESTED RIGHT GREATER SAPHENOUS VEIN (Chest) ECHOCARDIOGRAM, TRANSESOPHAGEAL (Chest)  Patient Location: ICU  Anesthesia Type:General  Level of Consciousness: sedated and Patient remains intubated per anesthesia plan  Airway & Oxygen Therapy: Patient remains intubated per anesthesia plan and Patient placed on Ventilator (see vital sign flow sheet for setting)  Post-op Assessment: Report given to RN and Post -op Vital signs reviewed and stable  Post vital signs: Reviewed and stable  Last Vitals:  Vitals Value Taken Time  BP 102/51 11/06/23  1313  Temp    Pulse 73 11/06/23 1312  Resp 16 11/06/23 1312  SpO2 100 % 11/06/23 1312  Vitals shown include unfiled device data.  Last Pain:  Vitals:   11/06/23 0638  TempSrc: (P) Oral  PainSc:       Patients Stated Pain Goal: 0 (11/04/23 1939)  Complications: No notable events documented.

## 2023-11-06 NOTE — Discharge Summary (Signed)
 Physician Discharge Summary  Patient ID: David Jarvis MRN: [id number] DOB/AGE: 72-Jun-1952 72 y.o.  Admit date: 11/04/2023 Discharge date: 11/10/2023  Admission Diagnoses:  Coronary artery disease Crescendo angina Hypertension, dyslipidemia Pancreatic adenocarcinoma s/p Whipple procedure Benign prostatic hyperplasia  Discharge Diagnoses:   Coronary artery disease Crescendo angina Hypertension, dyslipidemia Pancreatic adenocarcinoma s/p Whipple procedure Benign prostatic hyperplasia S/P CABG x 3   Discharged Condition: good Consults: cardiology  Referring: Tonny Bollman , MD Primary Care: Georgann Housekeeper, MD Primary Cardiologist: Thurmon Fair, MD   History of Present Illness:  David Jarvis is a very physically active 73 year old gentleman with history of stage Ib pancreatic adenocarcinoma status post Whipple procedure and chemotherapy about 2 years ago.  He also has a history of hypertension and dyslipidemia.  He was known to have multiple coronary calcifications on surveillance imaging around the time of his cancer treatment. While seeing his oncologist about 1 month ago he mentioned having neck and chest discomfort while biking uphill on his mountain bike over the previous 2 months.   A referral was made to cardiology ane he was evaluated by Dr. Royann Shivers in the office last week. He stated at the time that he he did not have angina when biking on level ground or during spin classes.  Left heart catheterization was recommended and arranged for today.  Prior to the catheterization this morning David Jarvis said that his angina had worsened and was now occurring with lower levels of activity.   Left heart catheterization demonstrated severe calcified stenoses in the proximal LAD and circumflex coronary arteries.  The RCA had minor nonobstructive irregularities.  The left ventriculogram showed normal LV function and the LVEDP was normal.  Due to the recent escalation from stable  angina to crescendo angina and the finding of critical coronary lesions, Dr. Excell Seltzer recommended hospital admission today and initiation of IV heparin.  CT surgery has been asked to evaluate David Jarvis for coronary bypass grafting.  Hospital Course: David Jarvis remained stable following left heart catheterization. He was seen by Dr. Dorris Fetch and scheduled for coronary artery bypass grafting.  He was taken to the OR on 11/06/23 where CABG x 3 was accomplished without complication.  Following surgery, he was transferred to the ICU in stable condition. Vital signs and hemodynamics remained stable. He was extubated within a few hours of arrival to the ICU. He had sinus bradycardia early post-op so the metoprolol was withheld and he was atrially paced using the epicardial pacing wires.  There is improved to a sinus rhythm and low-dose beta-blocker was able to be initiated.  The monitoring lines and drainage tubes were removed on the first post-op day and he was mobilized. His epicardial pacing wires were removed without complication. He had expected postop acute blood loss anemia and was started on an iron supplement.  This is stabilized and the trend is improving.  Most recent hemoglobin hematocrit dated 11/09/2023 was 8.4 and 24.6 respectively.  He had a reactive postoperative thrombocytopenia and platelet count is trending improved over time.  Most recent count is 97,000 on 11/09/2023.  The patient was routinely diuresed. He was felt stable for transfer to the progressive unit. He was ambulating well on room air. His bowels began moving on postop day 3. His incisions were healing well without sign of infection. He was felt stable for discharge home on postop day 4.    Significant Diagnostic Studies:  DG Chest Port 1 View Result Date: 11/08/2023 CLINICAL DATA:  [id number] S/P  CABG (coronary artery bypass graft) [id number] EXAM: PORTABLE CHEST 1 VIEW COMPARISON:  Chest radiograph from one day prior. FINDINGS: Interval removal  of Swan-Ganz catheter, left chest tube and mediastinal drain. Intact sternotomy wires. Stable cardiomediastinal silhouette with normal heart size. No pneumothorax. Small right and trace left pleural effusions, minimally increased. Similar low lung volumes with mild-to-moderate bibasilar atelectasis. No overt pulmonary edema. IMPRESSION: 1. Interval removal of Swan-Ganz catheter, left chest tube and mediastinal drain. No pneumothorax. 2. Small right and trace left pleural effusions, minimally increased. 3. Similar low lung volumes with mild-to-moderate bibasilar atelectasis. Electronically Signed   By: Delbert Phenix M.D.   On: 11/08/2023 09:47   DG Chest Port 1 View Result Date: 11/07/2023 CLINICAL DATA:  Status post coronary artery bypass graft. EXAM: PORTABLE CHEST 1 VIEW COMPARISON:  November 06, 2023. FINDINGS: Stable cardiomediastinal silhouette. Left-sided chest tube is noted without pneumothorax. Endotracheal and nasogastric tubes have been removed. Swan-Ganz catheter is unchanged. Mild right basilar subsegmental atelectasis is noted. Bony thorax is unremarkable. IMPRESSION: Stable left-sided chest tube without pneumothorax. Mild right basilar subsegmental atelectasis. Electronically Signed   By: Lupita Raider M.D.   On: 11/07/2023 09:43   ECHO INTRAOPERATIVE TEE Result Date: 11/06/2023  *INTRAOPERATIVE TRANSESOPHAGEAL REPORT *  Patient Name:   David Jarvis  Date of Exam: 11/06/2023 Medical Rec #:  [id number]      Height:       66.0 in Accession #:    [id number]     Weight:       140.0 lb Date of Birth:  November 21, 1950      BSA:          1.72 m Patient Age:    73 years       BP:           107/58 mmHg Patient Gender: M              HR:           63 bpm. Exam Location:  Anesthesiology Transesophogeal exam was perform intraoperatively during surgical procedure. Patient was closely monitored under general anesthesia during the entirety of examination. Indications:     CAD Performing Phys: [id number] STEVEN C HENDRICKSON  Complications: No known complications during this procedure. POST-OP IMPRESSIONS _ Left Ventricle: has normal systolic function, with an ejection fraction of 65%. The cavity size was normal. _ Right Ventricle: The right ventricle appears unchanged from pre-bypass normal function. _ Aorta: The aorta appears unchanged from pre-bypass. _ Left Atrial Appendage: The left atrial appendage appears unchanged from pre-bypass. _ Aortic Valve: There is mild regurgitation. _ Mitral Valve: There is mild regurgitation. _ Tricuspid Valve: There is mild regurgitation. _ Pulmonic Valve: The pulmonic valve appears unchanged from pre-bypass. _ Interatrial Septum: The interatrial septum appears unchanged from pre-bypass. _ Pericardium: The pericardium appears unchanged from pre-bypass. _ Comments: Post-bypass images reviewed with surgeon. PRE-OP FINDINGS  Left Ventricle: The left ventricle has normal systolic function, with an ejection fraction of 60-65%. The cavity size was normal. There is no left ventricular hypertrophy. Right Ventricle: The right ventricle has normal systolic function. The cavity was normal. There is no increase in right ventricular wall thickness. Left Atrium: Left atrial size was normal in size. No left atrial/left atrial appendage thrombus was detected. Left atrial appendage velocity is normal at greater than 40 cm/s. Right Atrium: Right atrial size was normal in size. Interatrial Septum: Evidence of atrial level shunting detected by color flow Doppler. Pericardium: There is no evidence  of pericardial effusion. Mitral Valve: The mitral valve is normal in structure. Mitral valve regurgitation is mild by color flow Doppler. Tricuspid Valve: The tricuspid valve was normal in structure. Tricuspid valve regurgitation is trivial by color flow Doppler. Aortic Valve: The aortic valve is tricuspid Aortic valve regurgitation is mild by color flow Doppler. There is no stenosis of the aortic valve. There is mild  calcification present. Pulmonic Valve: The pulmonic valve was normal in structure. Pulmonic valve regurgitation is not visualized by color flow Doppler. Aorta: The aortic root, ascending aorta and aortic arch are normal in size and structure. Pulmonary Artery: The pulmonary artery is of normal size. +--------------+--------++ LEFT VENTRICLE         +--------------+--------++ PLAX 2D                +--------------+--------++ LVIDd:        4.60 cm  +--------------+--------++ LVIDs:        2.60 cm  +--------------+--------++ LVOT diam:    2.50 cm  +--------------+--------++ LV SV:        73 ml    +--------------+--------++ LV SV Index:  42.22    +--------------+--------++ LVOT Area:    4.91 cm +--------------+--------++                        +--------------+--------++  +-------------+-------++ AORTA                +-------------+-------++ Ao Root diam:3.60 cm +-------------+-------++  +--------------+-------+ SHUNTS                +--------------+-------+ Systemic Diam:2.50 cm +--------------+-------+  Arrie Aran MD Electronically signed by Arrie Aran MD Signature Date/Time: 11/06/2023/5:57:26 PM    Final    DG Chest Port 1 View Result Date: 11/06/2023 CLINICAL DATA:  Status post CABG EXAM: PORTABLE CHEST 1 VIEW COMPARISON:  Preop x-ray 11/05/2023 and older FINDINGS: Interval median sternotomy. ET tube in place with tip seen proximally 3.5 cm above the carina. Enteric tube in place with side hole at the GE junction. This could be advanced further into the stomach. Mediastinal drains. Left chest tube. Right IJ Swan-Ganz catheter with tip along the pulmonary outflow tract. Underinflation. Basilar opacity seen. Atelectasis is favored. Recommend follow-up. No pneumothorax, effusion or edema. Normal cardiopericardial silhouette. Slight widening of the mediastinum could be postsurgical. Presumed right sided anterior chest wall gas. IMPRESSION: Acute postop changes. No  edema. Lung base opacity seen, left greater than right. Recommend follow-up. Right-sided chest wall gas. Numerous tubes and lines. Of note the enteric tube has side-hole at the GE junction and could be advanced further into the stomach. Electronically Signed   By: Karen Kays M.D.   On: 11/06/2023 15:09   VAS US DOPPLER PRE CABG Result Date: 11/05/2023 PREOPERATIVE VASCULAR EVALUATION Patient Name:  David Jarvis  Date of Exam:   11/05/2023 Medical Rec #: [id number]      Accession #:    [id number] Date of Birth: August 13, 1951      Patient Gender: M Patient Age:   89 years Exam Location:  [facility] Procedure:      VAS US DOPPLER PRE CABG Referring Phys: Viviann Spare HENDRICKSON --------------------------------------------------------------------------------  Indications:      Pre-CABG. Risk Factors:     Hypertension, hyperlipidemia, coronary artery disease. Comparison Study: No prior exam. Performing Technologist: Fernande Bras  Examination Guidelines: A complete evaluation includes B-mode imaging, spectral Doppler, color Doppler, and power Doppler as needed of all accessible portions of  each vessel. Bilateral testing is considered an integral part of a complete examination. Limited examinations for reoccurring indications may be performed as noted.  Right Carotid Findings: +----------+--------+--------+--------+--------+--------+           PSV cm/sEDV cm/sStenosisDescribeComments +----------+--------+--------+--------+--------+--------+ CCA Prox  75      17                               +----------+--------+--------+--------+--------+--------+ CCA Distal71      27                               +----------+--------+--------+--------+--------+--------+ ICA Prox  120     43                               +----------+--------+--------+--------+--------+--------+ ICA Mid   91      29                               +----------+--------+--------+--------+--------+--------+ ICA  Distal66      28                               +----------+--------+--------+--------+--------+--------+ ECA       85      14                               +----------+--------+--------+--------+--------+--------+ +----------+--------+-------+--------+------------+           PSV cm/sEDV cmsDescribeArm Pressure +----------+--------+-------+--------+------------+ Subclavian80                                  +----------+--------+-------+--------+------------+ +---------+--------+--+--------+--+ VertebralPSV cm/s45EDV cm/s18 +---------+--------+--+--------+--+ Left Carotid Findings: +----------+--------+--------+--------+--------+--------+           PSV cm/sEDV cm/sStenosisDescribeComments +----------+--------+--------+--------+--------+--------+ CCA Prox  97      21                               +----------+--------+--------+--------+--------+--------+ CCA Distal69      19                               +----------+--------+--------+--------+--------+--------+ ICA Prox  64      24                               +----------+--------+--------+--------+--------+--------+ ICA Mid   50      22                               +----------+--------+--------+--------+--------+--------+ ICA Distal42      22                               +----------+--------+--------+--------+--------+--------+ ECA       64      9                                +----------+--------+--------+--------+--------+--------+ +----------+--------+--------+--------+------------+  SubclavianPSV cm/sEDV cm/sDescribeArm Pressure +----------+--------+--------+--------+------------+           92                                   +----------+--------+--------+--------+------------+ +---------+--------+--+--------+--+ VertebralPSV cm/s40EDV cm/s13 +---------+--------+--+--------+--+  ABI Findings: +------------------+-----+---------+ Rt Pressure (mmHg)IndexWaveform   +------------------+-----+---------+ 126                    triphasic +------------------+-----+---------+ 151               1.20 triphasic +------------------+-----+---------+ 145               1.15 triphasic +------------------+-----+---------+ 107               0.85 Normal    +------------------+-----+---------+ +------------------+-----+---------+ Lt Pressure (mmHg)IndexWaveform  +------------------+-----+---------+ 123                    triphasic +------------------+-----+---------+ 154               1.22 triphasic +------------------+-----+---------+ 143               1.13 triphasic +------------------+-----+---------+ 138               1.10 Normal    +------------------+-----+---------+ +-------+---------------+ ABI/TBIToday's ABI/TBI +-------+---------------+ Right  1.20/0.85       +-------+---------------+ Left   1.22/1.10       +-------+---------------+  Right Doppler Findings: +--------+--------+---------+ Site    PressureDoppler   +--------+--------+---------+ [id number]     triphasic +--------+--------+---------+ Radial          triphasic +--------+--------+---------+ Ulnar           triphasic +--------+--------+---------+  Left Doppler Findings: +--------+--------+---------+ Site    PressureDoppler   +--------+--------+---------+ [id number]     triphasic +--------+--------+---------+ Radial          triphasic +--------+--------+---------+ Ulnar           triphasic +--------+--------+---------+   Summary: Right Carotid: The extracranial vessels were near-normal with only minimal wall                thickening or plaque. Left Carotid: The extracranial vessels were near-normal with only minimal wall               thickening or plaque. Vertebrals:  Bilateral vertebral arteries demonstrate antegrade flow. Subclavians: Normal flow hemodynamics were seen in bilateral subclavian              arteries. Right ABI: Resting right  ankle-brachial index is within normal range. The right toe-brachial index is normal. Left ABI: Resting left ankle-brachial index is within normal range. The left toe-brachial index is normal. Right Upper Extremity: Doppler waveform obliterate with right radial compression. Doppler waveforms remain within normal limits with right ulnar compression. Left Upper Extremity: Doppler waveforms remain within normal limits with left radial compression. Doppler waveform obliterate with left ulnar compression.  Electronically signed by Sherald Hess MD on 11/05/2023 at 9:10:21 PM.    Final    DG Chest 2 View Result Date: 11/05/2023 CLINICAL DATA:  Preop for cardiac surgery. EXAM: CHEST - 2 VIEW COMPARISON:  May 13, 2022. FINDINGS: The heart size and mediastinal contours are within normal limits. Both lungs are clear. The visualized skeletal structures are unremarkable. IMPRESSION: No active cardiopulmonary disease. Electronically Signed   By: Lupita Raider M.D.   On: 11/05/2023 14:47   ECHOCARDIOGRAM COMPLETE Result Date: 11/04/2023  ECHOCARDIOGRAM REPORT   Patient Name:   David Jarvis Date of Exam: 11/04/2023 Medical Rec #:  [id number]     Height:       66.0 in Accession #:    [id number]    Weight:       140.0 lb Date of Birth:  11-08-1950     BSA:          1.719 m Patient Age:    73 years      BP:           118/74 mmHg Patient Gender: M             HR:           52 bpm. Exam Location:  Inpatient Procedure: 2D Echo, Cardiac Doppler, Color Doppler and Intracardiac            Opacification Agent (Both Spectral and Color Flow Doppler were            utilized during procedure). Indications:    CAD Native Vessel  History:        Patient has no prior history of Echocardiogram examinations.                 CAD; Risk Factors:Hypertension and Dyslipidemia.  Sonographer:    Karma Ganja Referring Phys: Loreli Slot  Sonographer Comments: Image acquisition challenging due to patient body habitus. IMPRESSIONS  1.  Left ventricular ejection fraction, by estimation, is 50 to 55%. The left ventricle has low normal function. The left ventricle demonstrates regional wall motion abnormalities (see scoring diagram/findings for description). Left ventricular diastolic  parameters were normal.  2. Right ventricular systolic function is normal. The right ventricular size is normal. There is normal pulmonary artery systolic pressure. The estimated right ventricular systolic pressure is 33.9 mmHg.  3. The mitral valve is grossly normal. Trivial mitral valve regurgitation. No evidence of mitral stenosis.  4. The aortic valve is tricuspid. There is mild calcification of the aortic valve. Aortic valve regurgitation is mild. Aortic valve sclerosis is present, with no evidence of aortic valve stenosis.  5. The inferior vena cava is normal in size with greater than 50% respiratory variability, suggesting right atrial pressure of 3 mmHg. FINDINGS  Left Ventricle: Left ventricular ejection fraction, by estimation, is 50 to 55%. The left ventricle has low normal function. The left ventricle demonstrates regional wall motion abnormalities. Definity contrast agent was given IV to delineate the left ventricular endocardial borders. The left ventricular internal cavity size was normal in size. There is no left ventricular hypertrophy. Left ventricular diastolic parameters were normal.  LV Wall Scoring: The mid and distal anterior septum, mid inferoseptal segment, and apex are hypokinetic. Right Ventricle: The right ventricular size is normal. No increase in right ventricular wall thickness. Right ventricular systolic function is normal. There is normal pulmonary artery systolic pressure. The tricuspid regurgitant velocity is 2.78 m/s, and  with an assumed right atrial pressure of 3 mmHg, the estimated right ventricular systolic pressure is 33.9 mmHg. Left Atrium: Left atrial size was normal in size. Right Atrium: Right atrial size was normal in size.  Pericardium: There is no evidence of pericardial effusion. Mitral Valve: The mitral valve is grossly normal. Mild to moderate mitral annular calcification. Trivial mitral valve regurgitation. No evidence of mitral valve stenosis. Tricuspid Valve: The tricuspid valve is grossly normal. Tricuspid valve regurgitation is trivial. No evidence of tricuspid stenosis. Aortic Valve: The aortic valve is tricuspid. There is mild calcification of  the aortic valve. Aortic valve regurgitation is mild. Aortic valve sclerosis is present, with no evidence of aortic valve stenosis. Aortic valve mean gradient measures 4.0 mmHg. Aortic valve peak gradient measures 6.9 mmHg. Aortic valve area, by VTI measures 2.76 cm. Pulmonic Valve: The pulmonic valve was grossly normal. Pulmonic valve regurgitation is not visualized. No evidence of pulmonic stenosis. Aorta: The aortic root is normal in size and structure. Venous: The inferior vena cava is normal in size with greater than 50% respiratory variability, suggesting right atrial pressure of 3 mmHg. IAS/Shunts: The atrial septum is grossly normal.  LEFT VENTRICLE PLAX 2D LVIDd:         4.70 cm   Diastology LVIDs:         3.10 cm   LV e' medial:    9.14 cm/s LV PW:         0.80 cm   LV E/e' medial:  8.7 LV IVS:        0.80 cm   LV e' lateral:   10.90 cm/s LVOT diam:     2.00 cm   LV E/e' lateral: 7.3 LV SV:         91 LV SV Index:   53 LVOT Area:     3.14 cm  RIGHT VENTRICLE RV Basal diam:  3.80 cm RV S prime:     15.20 cm/s TAPSE (M-mode): 2.6 cm LEFT ATRIUM             Index        RIGHT ATRIUM           Index LA diam:        3.20 cm 1.86 cm/m   RA Area:     13.90 cm LA Vol (A2C):   50.0 ml 29.09 ml/m  RA Volume:   31.40 ml  18.27 ml/m LA Vol (A4C):   25.2 ml 14.66 ml/m LA Biplane Vol: 38.2 ml 22.23 ml/m  AORTIC VALVE AV Area (Vmax):    3.41 cm AV Area (Vmean):   2.47 cm AV Area (VTI):     2.76 cm AV Vmax:           131.00 cm/s AV Vmean:          94.100 cm/s AV VTI:             0.330 m AV Peak Grad:      6.9 mmHg AV Mean Grad:      4.0 mmHg LVOT Vmax:         142.00 cm/s LVOT Vmean:        73.900 cm/s LVOT VTI:          0.290 m LVOT/AV VTI ratio: 0.88  AORTA Ao Root diam: 3.60 cm MITRAL VALVE                TRICUSPID VALVE MV Area (PHT): 3.08 cm     TR Peak grad:   30.9 mmHg MV Decel Time: 246 msec     TR Vmax:        278.00 cm/s MV E velocity: 79.70 cm/s MV A velocity: 106.00 cm/s  SHUNTS MV E/A ratio:  0.75         Systemic VTI:  0.29 m                             Systemic Diam: 2.00 cm Lennie Odor MD Electronically signed by Lennie Odor MD Signature Date/Time: 11/04/2023/3:34:28 PM  Final    CARDIAC CATHETERIZATION Result Date: 11/04/2023 1.  Critical heavily calcified proximal LAD stenosis 2.  Critical heavily calcified ostial left circumflex stenosis 3.  Patent RCA with mild nonobstructive plaquing in the main vessel and moderate 50% stenosis at the origin of the PDA, likely not flow-limiting 4.  Normal LVEF with no regional wall motion abnormalities and normal LVEDP Recommendations: I think the patient has surgical anatomy and would do best with CABG, likely with a LIMA to LAD and bypass graft to one of his obtuse marginals.  He will be admitted for IV heparin in the setting of high risk anatomy and cardiac surgical consultation will be obtained.     Results for orders placed or performed during the hospital encounter of 11/04/23 (from the past 48 hours)  Glucose, capillary     Status: Abnormal   Collection Time: 11/08/23  8:05 AM  Result Value Ref Range   Glucose-Capillary 104 (H) 70 - 99 mg/dL    Comment: Glucose reference range applies only to samples taken after fasting for at least 8 hours.  Glucose, capillary     Status: Abnormal   Collection Time: 11/08/23 11:28 AM  Result Value Ref Range   Glucose-Capillary 141 (H) 70 - 99 mg/dL    Comment: Glucose reference range applies only to samples taken after fasting for at least 8 hours.  CBC     Status: Abnormal    Collection Time: 11/09/23  4:32 AM  Result Value Ref Range   WBC 6.2 4.0 - 10.5 K/uL   RBC 2.74 (L) 4.22 - 5.81 MIL/uL   Hemoglobin 8.4 (L) 13.0 - 17.0 g/dL   HCT 65.7 (L) 84.6 - 96.2 %   MCV 89.8 80.0 - 100.0 fL   MCH 30.7 26.0 - 34.0 pg   MCHC 34.1 30.0 - 36.0 g/dL   RDW 95.2 84.1 - 32.4 %   Platelets 97 (L) 150 - 400 K/uL    Comment: Immature Platelet Fraction may be clinically indicated, consider ordering this additional test [id number] REPEATED TO VERIFY    nRBC 0.0 0.0 - 0.2 %    Comment: Performed at [facility], [address], [place], [place] [address]  Basic metabolic panel     Status: Abnormal   Collection Time: 11/09/23  4:32 AM  Result Value Ref Range   Sodium 139 135 - 145 mmol/L   Potassium 4.5 3.5 - 5.1 mmol/L   Chloride 104 98 - 111 mmol/L   CO2 28 22 - 32 mmol/L   Glucose, Bld 126 (H) 70 - 99 mg/dL    Comment: Glucose reference range applies only to samples taken after fasting for at least 8 hours.   BUN 12 8 - 23 mg/dL   Creatinine, Ser 0.34 0.61 - 1.24 mg/dL   Calcium 8.1 (L) 8.9 - 10.3 mg/dL   GFR, Estimated >74 >25 mL/min    Comment: (NOTE) Calculated using the CKD-EPI Creatinine Equation (2021)    Anion gap 7 5 - 15    Comment: Performed at [facility], [address], [place], [place] [address]    Treatments: surgery:  Operative Report    DATE OF PROCEDURE: 11/06/2023   PREOPERATIVE DIAGNOSIS:  Severe two-vessel coronary artery disease with accelerating angina.   POSTOPERATIVE DIAGNOSIS:  Severe two-vessel coronary artery disease with accelerating angina.   PROCEDURE:  Median sternotomy, extracorporeal circulation, coronary artery bypass grafting x3 (left internal mammary artery to LAD, saphenous vein graft to first diagonal,  saphenous vein graft to obtuse marginal), endoscopic vein harvest to right thigh.   SURGEON:  Salvatore Decent. Dorris Fetch, MD   ASSISTANT:  Jillyn Hidden, PA Discharge Exam: Blood pressure 99/62, pulse  99, temperature 98.3 F (36.8 C), temperature source Oral, resp. rate 16, height (P) 5\' 6"  (1.676 m), weight 63.5 kg, SpO2 98%.   General appearance: alert, cooperative, and no distress Heart: regular rate and rhythm Lungs: clear to auscultation bilaterally Abdomen: benign Extremities: no edema Wound: incis healing well Disposition: Discharge disposition: 01-Home or Self Care        Allergies as of 11/10/2023       Reactions   Ace Inhibitors Cough   Lisinopril Cough   Lisinopril-hydrochlorothiazide Cough        Medication List     STOP taking these medications    metoprolol succinate 25 MG 24 hr tablet Commonly known as: TOPROL-XL       TAKE these medications    aspirin EC 81 MG tablet Take 81 mg by mouth daily.   atorvastatin 80 MG tablet Commonly known as: LIPITOR Take 1 tablet (80 mg total) by mouth daily. What changed:  medication strength how much to take how to take this when to take this   levothyroxine 25 MCG tablet Commonly known as: SYNTHROID Take 1 tablet by mouth daily before breakfast   metoprolol tartrate 25 MG tablet Commonly known as: LOPRESSOR Take 0.5 tablets (12.5 mg total) by mouth 2 (two) times daily.   pantoprazole 40 MG tablet Commonly known as: PROTONIX Take 1 tablet by mouth once daily. (TAKE ONE TABLET BY MOUTH EVERY DAY 90 days)   tamsulosin 0.4 MG Caps capsule Commonly known as: FLOMAX Take 1 capsule by mouth once daily   traMADol 50 MG tablet Commonly known as: ULTRAM Take 1 tablet (50 mg total) by mouth every 6 (six) hours as needed for up to 7 days for moderate pain (pain score 4-6).               Durable Medical Equipment  (From admission, onward)           Start     Ordered   11/09/23 1051  For home use only DME Walker rolling  Once       Question Answer Comment  Walker: With 5 Inch Wheels   Patient needs a walker to treat with the following condition Physical deconditioning   Patient needs a  walker to treat with the following condition S/P CABG x 3      11/09/23 1050            Follow-up Information     Monge, Petra Kuba, NP. Go on 11/28/2023.   Specialties: Cardiology, Family Medicine Why: Your appointment is at 10:55am. Contact information: [address] [place] [address] [phone]         Loreli Slot, MD. Go on 12/16/2023.   Specialty: Cardiothoracic Surgery Why: Your appointment is at 9:45am.  Please obtain a CXR 1 hour before the appointment at [facility] located at [address]. Contact information: [address] [employer] [address] [place] [address] [phone]                 The patient has been discharged on:   y                              No   [   ]  If No, reason:  2.Ace Inhibitor/ARB: Yes [   ]                                     No  [    n]                                     If No, reason:intolerant  3.Statin:   Yes [  y ]                  No  [   ]                  If No, reason:  4.Ecasa:  Yes  [  y ]                  No   [   ]                  If No, reason:  5. ACS on Admission?n  P2Y12 Inhibitor:  Yes  [   ]                                No  [  n]    Signed: Rowe Clack, PA-C 11/10/2023, 7:54 AM

## 2023-11-06 NOTE — Anesthesia Procedure Notes (Signed)
 Central Venous Catheter Insertion Performed by: Collene Schlichter, MD, anesthesiologist Start/End3/02/2024 7:00 AM, 11/06/2023 7:10 AM Patient location: Pre-op. Preanesthetic checklist: patient identified, IV checked, site marked, risks and benefits discussed, surgical consent, monitors and equipment checked, pre-op evaluation and timeout performed Position: Trendelenburg Hand hygiene performed  and maximum sterile barriers used  Total catheter length 100. PA cath was placed.Swan type:thermodilution PA Cath depth:46 Procedure performed without using ultrasound guided technique. Attempts: 1 Patient tolerated the procedure well with no immediate complications.

## 2023-11-06 NOTE — Interval H&P Note (Signed)
 History and Physical Interval Note:  11/06/2023 7:52 AM  David Jarvis  has presented today for surgery, with the diagnosis of CAD.  The various methods of treatment have been discussed with the patient and family. After consideration of risks, benefits and other options for treatment, the patient has consented to  Procedure(s): CORONARY ARTERY BYPASS GRAFTING (CABG) (N/A) ECHOCARDIOGRAM, TRANSESOPHAGEAL (N/A) as a surgical intervention.  The patient's history has been reviewed, patient examined, no change in status, stable for surgery.  I have reviewed the patient's chart and labs.  Questions were answered to the patient's satisfaction.     Loreli Slot

## 2023-11-06 NOTE — Anesthesia Procedure Notes (Signed)
 Arterial Line Insertion Start/End3/02/2024 7:00 AM, 11/06/2023 7:05 AM Performed by: Waynard Edwards, CRNA, CRNA  Patient location: Pre-op. Preanesthetic checklist: patient identified, IV checked, site marked, risks and benefits discussed, surgical consent, monitors and equipment checked, pre-op evaluation, timeout performed and anesthesia consent Lidocaine 1% used for infiltration and patient sedated Left, radial was placed Catheter size: 20 G Hand hygiene performed , maximum sterile barriers used  and Seldinger technique used Allen's test indicative of satisfactory collateral circulation Attempts: 1 Procedure performed without using ultrasound guided technique. Following insertion, dressing applied and Biopatch. Post procedure assessment: normal and unchanged  Patient tolerated the procedure well with no immediate complications.

## 2023-11-06 NOTE — Progress Notes (Signed)
 EVENING ROUNDS NOTE :     [address]       [name] [address]             [phone]                 * Day of Surgery * Procedure(s) (LRB): CORONARY ARTERY BYPASS GRAFTING (CABG) TIMES THREE USING LEFT INTERNAL MAMMARY ARTERY AND ENDOSCOPICALLY HARVESTED RIGHT GREATER SAPHENOUS VEIN (N/A) ECHOCARDIOGRAM, TRANSESOPHAGEAL (N/A)   Total Length of Stay:  LOS: 2 days  Events:   No events Extubated Good HD    BP (!) 93/50   Pulse 74   Temp 99.1 F (37.3 C)   Resp 20   Ht (P) 5\' 6"  (1.676 m)   Wt (P) 63.5 kg   SpO2 100%   BMI (P) 22.60 kg/m   PAP: (11-26)/(-2-11) 20/1 CO:  [3.7 L/min-5 L/min] 5 L/min CI:  [2.2 L/min/m2-2.9 L/min/m2] 2.9 L/min/m2  Vent Mode: CPAP;PSV FiO2 (%):  [40 %-50 %] 40 % Set Rate:  [4 bmp-16 bmp] 4 bmp Vt Set:  [510 mL] 510 mL PEEP:  [5 cmH20] 5 cmH20 Pressure Support:  [10 cmH20] 10 cmH20 Plateau Pressure:  [13 cmH20] 13 cmH20   sodium chloride 10 mL/hr at 11/06/23 1800   albumin human 999 mL/hr at 11/06/23 1800    ceFAZolin (ANCEF) IV Stopped (11/06/23 1413)   dexmedetomidine (PRECEDEX) IV infusion Stopped (11/06/23 1449)   insulin Stopped (11/06/23 1325)   magnesium sulfate 20 mL/hr at 11/06/23 1800   nitroGLYCERIN     norepinephrine (LEVOPHED) Adult infusion Stopped (11/06/23 1335)   phenylephrine (NEO-SYNEPHRINE) Adult infusion Stopped (11/06/23 1649)   vancomycin      I/O last 3 completed shifts: In: 266 [P.O.:240; I.V.:26] Out: 1800 [Urine:1800]      Latest Ref Rng & Units 11/06/2023    3:28 PM 11/06/2023    1:21 PM 11/06/2023    1:03 PM  CBC  WBC 4.0 - 10.5 K/uL   6.9   Hemoglobin 13.0 - 17.0 g/dL 5.8  8.2  8.1   Hematocrit 39.0 - 52.0 % 17.0  24.0  23.8   Platelets 150 - 400 K/uL   82        Latest Ref Rng & Units 11/06/2023    3:28 PM 11/06/2023    1:21 PM 11/06/2023   12:16 PM  BMP  Glucose 70 - 99 mg/dL   027   BUN 8 - 23 mg/dL   8   Creatinine 2.53 - 1.24 mg/dL   6.64   Sodium 403 - 474 mmol/L 144   145  139   Potassium 3.5 - 5.1 mmol/L 3.9  4.1  5.2   Chloride 98 - 111 mmol/L   103     ABG    Component Value Date/Time   PHART 7.491 (H) 11/06/2023 1528   PCO2ART 27.3 (L) 11/06/2023 1528   PO2ART 160 (H) 11/06/2023 1528   HCO3 20.9 11/06/2023 1528   TCO2 22 11/06/2023 1528   ACIDBASEDEF 2.0 11/06/2023 1528   O2SAT 100 11/06/2023 1528       Brynda Greathouse, MD 11/06/2023 6:50 PM

## 2023-11-06 NOTE — Discharge Instructions (Signed)

## 2023-11-06 NOTE — Progress Notes (Signed)
  Echocardiogram Echocardiogram Transesophageal has been performed.  David Jarvis 11/06/2023, 5:08 PM

## 2023-11-06 NOTE — Anesthesia Procedure Notes (Signed)
 Procedure Name: Intubation Date/Time: 11/06/2023 8:15 AM  Performed by: Waynard Edwards, CRNAPre-anesthesia Checklist: Patient identified, Emergency Drugs available, Suction available and Patient being monitored Patient Re-evaluated:Patient Re-evaluated prior to induction Oxygen Delivery Method: Circle system utilized Preoxygenation: Pre-oxygenation with 100% oxygen Induction Type: IV induction Ventilation: Mask ventilation without difficulty Laryngoscope Size: Miller and 2 Grade View: Grade I Tube type: Oral Tube size: 7.5 mm Number of attempts: 1 Airway Equipment and Method: Stylet Placement Confirmation: ETT inserted through vocal cords under direct vision, positive ETCO2 and breath sounds checked- equal and bilateral Secured at: 22 cm Tube secured with: Tape Dental Injury: Teeth and Oropharynx as per pre-operative assessment

## 2023-11-07 ENCOUNTER — Encounter (HOSPITAL_COMMUNITY): Payer: Self-pay | Admitting: Thoracic Surgery (Cardiothoracic Vascular Surgery)

## 2023-11-07 ENCOUNTER — Inpatient Hospital Stay (HOSPITAL_COMMUNITY)

## 2023-11-07 LAB — TYPE AND SCREEN
ABO/RH(D): O NEG
Antibody Screen: NEGATIVE
Unit division: 0

## 2023-11-07 LAB — BASIC METABOLIC PANEL
Anion gap: 6 (ref 5–15)
Anion gap: 9 (ref 5–15)
BUN: 11 mg/dL (ref 8–23)
BUN: 12 mg/dL (ref 8–23)
CO2: 22 mmol/L (ref 22–32)
CO2: 22 mmol/L (ref 22–32)
Calcium: 7.7 mg/dL — ABNORMAL LOW (ref 8.9–10.3)
Calcium: 7.7 mg/dL — ABNORMAL LOW (ref 8.9–10.3)
Chloride: 108 mmol/L (ref 98–111)
Chloride: 102 mmol/L (ref 98–111)
Creatinine, Ser: 0.93 mg/dL (ref 0.61–1.24)
Creatinine, Ser: 1.08 mg/dL (ref 0.61–1.24)
GFR, Estimated: >60 mL/min (ref >60)
GFR, Estimated: >60 mL/min (ref >60)
Glucose, Bld: 125 mg/dL — ABNORMAL HIGH (ref 70–99)
Glucose, Bld: 206 mg/dL — ABNORMAL HIGH (ref 70–99)
Potassium: 4.7 mmol/L (ref 3.5–5.1)
Potassium: 3.8 mmol/L (ref 3.5–5.1)
Sodium: 136 mmol/L (ref 135–145)
Sodium: 133 mmol/L — ABNORMAL LOW (ref 135–145)

## 2023-11-07 LAB — CBC
HCT: 23 % — ABNORMAL LOW (ref 39.0–52.0)
HCT: 23.8 % — ABNORMAL LOW (ref 39.0–52.0)
Hemoglobin: 8 g/dL — ABNORMAL LOW (ref 13.0–17.0)
Hemoglobin: 8 g/dL — ABNORMAL LOW (ref 13.0–17.0)
MCH: 30.8 pg (ref 26.0–34.0)
MCH: 30.3 pg (ref 26.0–34.0)
MCHC: 34.8 g/dL (ref 30.0–36.0)
MCHC: 33.6 g/dL (ref 30.0–36.0)
MCV: 88.5 fL (ref 80.0–100.0)
MCV: 90.2 fL (ref 80.0–100.0)
Platelets: 75 10*3/uL — ABNORMAL LOW (ref 150–400)
Platelets: 88 10*3/uL — ABNORMAL LOW (ref 150–400)
RBC: 2.6 MIL/uL — ABNORMAL LOW (ref 4.22–5.81)
RBC: 2.64 MIL/uL — ABNORMAL LOW (ref 4.22–5.81)
RDW: 13.7 % (ref 11.5–15.5)
RDW: 14.1 % (ref 11.5–15.5)
WBC: 7.5 10*3/uL (ref 4.0–10.5)
WBC: 7 10*3/uL (ref 4.0–10.5)
nRBC: 0 % (ref 0.0–0.2)
nRBC: 0 % (ref 0.0–0.2)

## 2023-11-07 LAB — GLUCOSE, CAPILLARY
Glucose-Capillary: 100 mg/dL — ABNORMAL HIGH (ref 70–99)
Glucose-Capillary: 118 mg/dL — ABNORMAL HIGH (ref 70–99)
Glucose-Capillary: 118 mg/dL — ABNORMAL HIGH (ref 70–99)
Glucose-Capillary: 121 mg/dL — ABNORMAL HIGH (ref 70–99)
Glucose-Capillary: 122 mg/dL — ABNORMAL HIGH (ref 70–99)
Glucose-Capillary: 123 mg/dL — ABNORMAL HIGH (ref 70–99)
Glucose-Capillary: 142 mg/dL — ABNORMAL HIGH (ref 70–99)
Glucose-Capillary: 148 mg/dL — ABNORMAL HIGH (ref 70–99)
Glucose-Capillary: 161 mg/dL — ABNORMAL HIGH (ref 70–99)
Glucose-Capillary: 164 mg/dL — ABNORMAL HIGH (ref 70–99)
Glucose-Capillary: 198 mg/dL — ABNORMAL HIGH (ref 70–99)

## 2023-11-07 LAB — BPAM RBC
Blood Product Expiration Date: 202503122359
ISSUE DATE / TIME: 202503062007
Unit Type and Rh: 9500

## 2023-11-07 LAB — MAGNESIUM
Magnesium: 2.9 mg/dL — ABNORMAL HIGH (ref 1.7–2.4)
Magnesium: 2.6 mg/dL — ABNORMAL HIGH (ref 1.7–2.4)

## 2023-11-07 SURGERY — LEFT HEART CATH AND CORONARY ANGIOGRAPHY
Anesthesia: LOCAL

## 2023-11-07 MED ORDER — INSULIN ASPART 100 UNIT/ML IJ SOLN
0.0000 [IU] | INTRAMUSCULAR | Status: DC
  Administered 2023-11-07: 4 [IU] via SUBCUTANEOUS
  Administered 2023-11-07 – 2023-11-08 (×2): 2 [IU] via SUBCUTANEOUS

## 2023-11-07 MED ORDER — ALBUMIN HUMAN 5 % IV SOLN
12.5000 g | Freq: Once | INTRAVENOUS | Status: AC
  Administered 2023-11-07: 12.5 g via INTRAVENOUS

## 2023-11-07 MED ORDER — LACTULOSE 10 GM/15ML PO SOLN
20.0000 g | Freq: Every day | ORAL | Status: DC | PRN
  Administered 2023-11-09: 20 g via ORAL
  Filled 2023-11-07: qty 30

## 2023-11-07 MED ORDER — ENOXAPARIN SODIUM 40 MG/0.4ML IJ SOSY
40.0000 mg | PREFILLED_SYRINGE | Freq: Every day | INTRAMUSCULAR | Status: DC
Start: 2023-11-07 — End: 2023-11-07

## 2023-11-07 MED FILL — Magnesium Sulfate Inj 50%: INTRAMUSCULAR | Qty: 10 | Status: AC

## 2023-11-07 MED FILL — Potassium Chloride Inj 2 mEq/ML: INTRAVENOUS | Qty: 40 | Status: AC

## 2023-11-07 MED FILL — Heparin Sodium (Porcine) Inj 1000 Unit/ML: Qty: 1000 | Status: AC

## 2023-11-07 SURGICAL SUPPLY — 1 items: INQWIRE 1.5J .035X260CM (WIRE) ×1 IMPLANT

## 2023-11-07 NOTE — Progress Notes (Signed)
 Patient ID: David Jarvis, male   DOB: [birth year], 73 y.o.   MRN: [id number] TCTS Evening Rounds:  Hemodynamically stable in sinus rhythm.  UO good.  CT's out.  BMET    Component Value Date/Time   NA 133 (L) 11/07/2023 1538   NA 138 10/29/2023 1547   K 3.8 11/07/2023 1538   CL 102 11/07/2023 1538   CO2 22 11/07/2023 1538   GLUCOSE 206 (H) 11/07/2023 1538   BUN 12 11/07/2023 1538   BUN 13 10/29/2023 1547   CREATININE 1.08 11/07/2023 1538   CREATININE 1.30 (H) 06/07/2022 0949   CALCIUM 7.7 (L) 11/07/2023 1538   EGFR 53 (L) 10/29/2023 1547   GFRNONAA >60 11/07/2023 1538   GFRNONAA 59 (L) 06/07/2022 0949   CBC    Component Value Date/Time   WBC 7.0 11/07/2023 1538   RBC 2.64 (L) 11/07/2023 1538   HGB 8.0 (L) 11/07/2023 1538   HGB 13.6 10/29/2023 1547   HCT 23.8 (L) 11/07/2023 1538   HCT 39.8 10/29/2023 1547   PLT 88 (L) 11/07/2023 1538   PLT 133 (L) 10/29/2023 1547   MCV 90.2 11/07/2023 1538   MCV 92 10/29/2023 1547   MCH 30.3 11/07/2023 1538   MCHC 33.6 11/07/2023 1538   RDW 14.1 11/07/2023 1538   RDW 13.0 10/29/2023 1547   LYMPHSABS 0.3 (L) 05/12/2022 2342   LYMPHSABS 1.8 10/29/2016 0736   MONOABS 0.7 05/12/2022 2342   EOSABS 0.0 05/12/2022 2342   EOSABS 0.1 10/29/2016 0736   BASOSABS 0.0 05/12/2022 2342   BASOSABS 0.0 10/29/2016 0736   Ambulated today.

## 2023-11-07 NOTE — Progress Notes (Signed)
 1 Day Post-Op Procedure(s) (LRB): CORONARY ARTERY BYPASS GRAFTING (CABG) TIMES THREE USING LEFT INTERNAL MAMMARY ARTERY AND ENDOSCOPICALLY HARVESTED RIGHT GREATER SAPHENOUS VEIN (N/A) ECHOCARDIOGRAM, TRANSESOPHAGEAL (N/A) Subjective: Feels tired, denies pain, nausea  Objective: Vital signs in last 24 hours: Temp:  [95.4 F (35.2 C)-99.5 F (37.5 C)] 97.7 F (36.5 C) (03/07 0700) Pulse Rate:  [45-78] 60 (03/07 0700) Cardiac Rhythm: Sinus bradycardia (03/07 0400) Resp:  [8-28] 14 (03/07 0700) BP: (84-124)/(50-70) 92/59 (03/07 0700) SpO2:  [96 %-100 %] 100 % (03/07 0700) Arterial Line BP: (91-138)/(40-63) 98/48 (03/07 0700) FiO2 (%):  [40 %-50 %] 40 % (03/06 1506) Weight:  [67.9 kg] 67.9 kg (03/07 0500)  Hemodynamic parameters for last 24 hours: PAP: (11-31)/(-2-14) 25/12 CVP:  [1 mmHg-13 mmHg] 9 mmHg CO:  [3.6 L/min-6 L/min] 3.8 L/min CI:  [2.1 L/min/m2-3.5 L/min/m2] 2.24 L/min/m2  Intake/Output from previous day: 03/06 0701 - 03/07 0700 In: 5126.2 [I.V.:1946.1; Blood:571.7; IV Piggyback:2608.4] Out: 8295 [AOZHY:8657; Blood:500; Chest Tube:642] Intake/Output this shift: No intake/output data recorded.  General appearance: alert, cooperative, and no distress Neurologic: intact Heart: regular rate and rhythm Lungs: diminished breath sounds bibasilar Abdomen: normal findings: soft, non-tender  Lab Results: Recent Labs    11/06/23 2300 11/07/23 0401  WBC 5.6 7.5  HGB 7.6* 8.0*  HCT 21.8* 23.0*  PLT 62* 75*   BMET:  Recent Labs    11/06/23 1855 11/07/23 0401  NA 141 136  K 4.4 4.7  CL 112* 108  CO2 21* 22  GLUCOSE 133* 125*  BUN 10 11  CREATININE 0.95 0.93  CALCIUM 8.0* 7.7*    PT/INR:  Recent Labs    11/06/23 1303  LABPROT 23.0*  INR 2.0*   ABG    Component Value Date/Time   PHART 7.491 (H) 11/06/2023 1528   HCO3 20.9 11/06/2023 1528   TCO2 22 11/06/2023 1528   ACIDBASEDEF 2.0 11/06/2023 1528   O2SAT 100 11/06/2023 1528   CBG (last 3)  Recent  Labs    11/07/23 0404 11/07/23 0506 11/07/23 0650  GLUCAP 123* 118* 122*    Assessment/Plan: S/P Procedure(s) (LRB): CORONARY ARTERY BYPASS GRAFTING (CABG) TIMES THREE USING LEFT INTERNAL MAMMARY ARTERY AND ENDOSCOPICALLY HARVESTED RIGHT GREATER SAPHENOUS VEIN (N/A) ECHOCARDIOGRAM, TRANSESOPHAGEAL (N/A) POD # 1 NEURO- intact CV- sinus brady in 50s  Borderline BP with low filling pressures- volume  Stop metoprolol due to bradycardia  ASA,add Plavix PTD  Statin RESP- bibasilar atelectasis- IS RENAL- creatinine normal  Not ready for any diuresis yet ENDO-  CBG mildly elevated  Transition from insulin drip to SSI GI-- diet as tolerated Anemia secondary to ABL- Hgb 8, monitor Thrombocytopenia- PLT up slightly to 75K  No enoxaparin SCD + ambulation for DVT prophylaxis Dc chest tubes   LOS: 3 days    Loreli Slot 11/07/2023

## 2023-11-08 ENCOUNTER — Inpatient Hospital Stay (HOSPITAL_COMMUNITY)

## 2023-11-08 LAB — BASIC METABOLIC PANEL
Anion gap: 7 (ref 5–15)
BUN: 11 mg/dL (ref 8–23)
CO2: 25 mmol/L (ref 22–32)
Calcium: 7.8 mg/dL — ABNORMAL LOW (ref 8.9–10.3)
Chloride: 102 mmol/L (ref 98–111)
Creatinine, Ser: 1.04 mg/dL (ref 0.61–1.24)
GFR, Estimated: >60 mL/min (ref >60)
Glucose, Bld: 132 mg/dL — ABNORMAL HIGH (ref 70–99)
Potassium: 4 mmol/L (ref 3.5–5.1)
Sodium: 134 mmol/L — ABNORMAL LOW (ref 135–145)

## 2023-11-08 LAB — CBC
HCT: 23.1 % — ABNORMAL LOW (ref 39.0–52.0)
Hemoglobin: 8.1 g/dL — ABNORMAL LOW (ref 13.0–17.0)
MCH: 31.2 pg (ref 26.0–34.0)
MCHC: 35.1 g/dL (ref 30.0–36.0)
MCV: 88.8 fL (ref 80.0–100.0)
Platelets: 77 10*3/uL — ABNORMAL LOW (ref 150–400)
RBC: 2.6 MIL/uL — ABNORMAL LOW (ref 4.22–5.81)
RDW: 14 % (ref 11.5–15.5)
WBC: 6.3 10*3/uL (ref 4.0–10.5)
nRBC: 0 % (ref 0.0–0.2)

## 2023-11-08 LAB — GLUCOSE, CAPILLARY
Glucose-Capillary: 96 mg/dL (ref 70–99)
Glucose-Capillary: 104 mg/dL — ABNORMAL HIGH (ref 70–99)
Glucose-Capillary: 141 mg/dL — ABNORMAL HIGH (ref 70–99)

## 2023-11-08 MED ORDER — FE FUM-VIT C-VIT B12-FA 460-60-0.01-1 MG PO CAPS
1.0000 | ORAL_CAPSULE | Freq: Every day | ORAL | Status: DC
Start: 2023-11-08 — End: 2023-11-10
  Administered 2023-11-08 – 2023-11-10 (×3): 1 via ORAL
  Filled 2023-11-08 (×3): qty 1

## 2023-11-08 MED ORDER — POTASSIUM CHLORIDE CRYS ER 20 MEQ PO TBCR
20.0000 meq | EXTENDED_RELEASE_TABLET | Freq: Two times a day (BID) | ORAL | Status: DC
  Administered 2023-11-08 – 2023-11-10 (×5): 20 meq via ORAL
  Filled 2023-11-08 (×5): qty 1

## 2023-11-08 MED ORDER — SODIUM CHLORIDE 0.9 % IV SOLN: 250.0000 mL | INTRAVENOUS | Status: AC | PRN

## 2023-11-08 MED ORDER — METOPROLOL TARTRATE 12.5 MG HALF TABLET
12.5000 mg | ORAL_TABLET | Freq: Two times a day (BID) | ORAL | Status: DC
  Administered 2023-11-08 – 2023-11-10 (×4): 12.5 mg via ORAL
  Filled 2023-11-08 (×4): qty 1

## 2023-11-08 MED ORDER — ASPIRIN 81 MG PO TBEC
81.0000 mg | DELAYED_RELEASE_TABLET | Freq: Every day | ORAL | Status: DC
  Administered 2023-11-08 – 2023-11-10 (×3): 81 mg via ORAL
  Filled 2023-11-08 (×3): qty 1

## 2023-11-08 MED ORDER — SENNOSIDES-DOCUSATE SODIUM 8.6-50 MG PO TABS
1.0000 | ORAL_TABLET | Freq: Two times a day (BID) | ORAL | Status: DC
  Administered 2023-11-08 – 2023-11-10 (×3): 1 via ORAL
  Filled 2023-11-08 (×4): qty 1

## 2023-11-08 MED ORDER — SODIUM CHLORIDE 0.9% FLUSH: 3.0000 mL | INTRAVENOUS | Status: DC | PRN

## 2023-11-08 MED ORDER — SODIUM CHLORIDE 0.9% FLUSH
3.0000 mL | Freq: Two times a day (BID) | INTRAVENOUS | Status: DC
  Administered 2023-11-08 – 2023-11-10 (×4): 3 mL via INTRAVENOUS

## 2023-11-08 MED ORDER — FUROSEMIDE 40 MG PO TABS
40.0000 mg | ORAL_TABLET | Freq: Every day | ORAL | Status: AC
  Administered 2023-11-08 – 2023-11-10 (×3): 40 mg via ORAL
  Filled 2023-11-08 (×3): qty 1

## 2023-11-08 MED ORDER — ~~LOC~~ CARDIAC SURGERY, PATIENT & FAMILY EDUCATION: Freq: Once | Status: AC

## 2023-11-08 NOTE — Progress Notes (Signed)
 Epicardial pacing wires removed per protocol. Vitals set to q15,wires intact upon removal, and no sustained ectopy noted on monitor. MAP remained greater than 65 with no adventitious heart sounds after two hours.

## 2023-11-08 NOTE — Progress Notes (Signed)
 2 Days Post-Op Procedure(s) (LRB): CORONARY ARTERY BYPASS GRAFTING (CABG) TIMES THREE USING LEFT INTERNAL MAMMARY ARTERY AND ENDOSCOPICALLY HARVESTED RIGHT GREATER SAPHENOUS VEIN (N/A) ECHOCARDIOGRAM, TRANSESOPHAGEAL (N/A) Subjective: No complaints.  Working on IS.  Objective: Vital signs in last 24 hours: Temp:  [97.8 F (36.6 C)-98.5 F (36.9 C)] 98.5 F (36.9 C) (03/08 0800) Pulse Rate:  [61-80] 71 (03/08 0900) Cardiac Rhythm: Normal sinus rhythm (03/08 0800) Resp:  [12-27] 15 (03/08 0900) BP: (81-127)/(60-76) 120/76 (03/08 0900) SpO2:  [88 %-100 %] 99 % (03/08 0900) Arterial Line BP: (93-143)/(40-61) 109/44 (03/07 1430) Weight:  [68.1 kg] 68.1 kg (03/08 0702)  Hemodynamic parameters for last 24 hours:    Intake/Output from previous day: 03/07 0701 - 03/08 0700 In: 250.9 [I.V.:0.9; IV Piggyback:250] Out: 1133 [Urine:1133] Intake/Output this shift: Total I/O In: 299.9 [IV Piggyback:299.9] Out: 75 [Urine:75]  General appearance: alert and cooperative Neurologic: intact Heart: regular rate and rhythm Lungs: clear to auscultation bilaterally Extremities: no edema Wound: incision healing well  Lab Results: Recent Labs    11/07/23 1538 11/08/23 0558  WBC 7.0 6.3  HGB 8.0* 8.1*  HCT 23.8* 23.1*  PLT 88* 77*   BMET:  Recent Labs    11/07/23 1538 11/08/23 0558  NA 133* 134*  K 3.8 4.0  CL 102 102  CO2 22 25  GLUCOSE 206* 132*  BUN 12 11  CREATININE 1.08 1.04  CALCIUM 7.7* 7.8*    PT/INR:  Recent Labs    11/06/23 1303  LABPROT 23.0*  INR 2.0*   ABG    Component Value Date/Time   PHART 7.491 (H) 11/06/2023 1528   HCO3 20.9 11/06/2023 1528   TCO2 22 11/06/2023 1528   ACIDBASEDEF 2.0 11/06/2023 1528   O2SAT 100 11/06/2023 1528   CBG (last 3)  Recent Labs    11/07/23 2022 11/07/23 2352 11/08/23 0355  GLUCAP 148* 121* 96   CXR: mild bibasilar atelectasis  Assessment/Plan: S/P Procedure(s) (LRB): CORONARY ARTERY BYPASS GRAFTING (CABG)  TIMES THREE USING LEFT INTERNAL MAMMARY ARTERY AND ENDOSCOPICALLY HARVESTED RIGHT GREATER SAPHENOUS VEIN (N/A) ECHOCARDIOGRAM, TRANSESOPHAGEAL (N/A)  POD 2 Hemodynamically stable in sinus rhythm. Continue low dose Lopressor.  DC pacing wires, then sleeve if stable.  Glucose under good control and no hx of DM with normal Hgb A1c. Will DC SSI.  Expected postop anemia: stable. Start iron.  Thrombocytopenia: stable. Decrease ASA to 81 mg.  Transfer to 4E and continue IS, ambulation.   LOS: 4 days    Alleen Borne 11/08/2023

## 2023-11-08 NOTE — Plan of Care (Signed)
   Problem: Activity: Goal: Risk for activity intolerance will decrease Outcome: Progressing   Problem: Nutrition: Goal: Adequate nutrition will be maintained Outcome: Progressing   Problem: Elimination: Goal: Will not experience complications related to bowel motility Outcome: Progressing

## 2023-11-09 LAB — CBC
HCT: 24.6 % — ABNORMAL LOW (ref 39.0–52.0)
Hemoglobin: 8.4 g/dL — ABNORMAL LOW (ref 13.0–17.0)
MCH: 30.7 pg (ref 26.0–34.0)
MCHC: 34.1 g/dL (ref 30.0–36.0)
MCV: 89.8 fL (ref 80.0–100.0)
Platelets: 97 10*3/uL — ABNORMAL LOW (ref 150–400)
RBC: 2.74 MIL/uL — ABNORMAL LOW (ref 4.22–5.81)
RDW: 14 % (ref 11.5–15.5)
WBC: 6.2 10*3/uL (ref 4.0–10.5)
nRBC: 0 % (ref 0.0–0.2)

## 2023-11-09 LAB — BASIC METABOLIC PANEL
Anion gap: 7 (ref 5–15)
BUN: 12 mg/dL (ref 8–23)
CO2: 28 mmol/L (ref 22–32)
Calcium: 8.1 mg/dL — ABNORMAL LOW (ref 8.9–10.3)
Chloride: 104 mmol/L (ref 98–111)
Creatinine, Ser: 1.07 mg/dL (ref 0.61–1.24)
GFR, Estimated: >60 mL/min (ref >60)
Glucose, Bld: 126 mg/dL — ABNORMAL HIGH (ref 70–99)
Potassium: 4.5 mmol/L (ref 3.5–5.1)
Sodium: 139 mmol/L (ref 135–145)

## 2023-11-09 NOTE — Plan of Care (Signed)
  Problem: Nutrition: Goal: Adequate nutrition will be maintained Outcome: Progressing   Problem: Coping: Goal: Level of anxiety will decrease Outcome: Progressing   Problem: Elimination: Goal: Will not experience complications related to bowel motility Outcome: Progressing   Problem: Elimination: Goal: Will not experience complications related to urinary retention Outcome: Progressing   

## 2023-11-09 NOTE — Progress Notes (Addendum)
 [address]       [place] [address]             [phone]      3 Days Post-Op Procedure(s) (LRB): CORONARY ARTERY BYPASS GRAFTING (CABG) TIMES THREE USING LEFT INTERNAL MAMMARY ARTERY AND ENDOSCOPICALLY HARVESTED RIGHT GREATER SAPHENOUS VEIN (N/A) ECHOCARDIOGRAM, TRANSESOPHAGEAL (N/A) Subjective: Patient states he feels good this AM, no specific complaints  Objective: Vital signs in last 24 hours: Temp:  [98.2 F (36.8 C)-98.9 F (37.2 C)] 98.5 F (36.9 C) (03/09 0310) Pulse Rate:  [68-87] 71 (03/09 0310) Cardiac Rhythm: Normal sinus rhythm (03/08 2100) Resp:  [13-22] 18 (03/09 0310) BP: (86-127)/(64-85) 101/68 (03/09 0310) SpO2:  [90 %-99 %] 95 % (03/09 0310) Weight:  [68.1 kg] 68.1 kg (03/08 0702)  Hemodynamic parameters for last 24 hours:    Intake/Output from previous day: 03/08 0701 - 03/09 0700 In: 299.9 [IV Piggyback:299.9] Out: 3250 [Urine:3250] Intake/Output this shift: Total I/O In: -  Out: 550 [Urine:550]  General appearance: alert, cooperative, and no distress Neurologic: intact Heart: regular rate and rhythm, S1, S2 normal, no murmur, click, rub or gallop Lungs: clear to auscultation bilaterally Abdomen: soft, non-tender; bowel sounds normal; no masses,  no organomegaly Extremities: edema trace Wound: Clean and dry without sign of infection  Lab Results: Recent Labs    11/08/23 0558 11/09/23 0432  WBC 6.3 6.2  HGB 8.1* 8.4*  HCT 23.1* 24.6*  PLT 77* 97*   BMET:  Recent Labs    11/08/23 0558 11/09/23 0432  NA 134* 139  K 4.0 4.5  CL 102 104  CO2 25 28  GLUCOSE 132* 126*  BUN 11 12  CREATININE 1.04 1.07  CALCIUM 7.8* 8.1*    PT/INR:  Recent Labs    11/06/23 1303  LABPROT 23.0*  INR 2.0*   ABG    Component Value Date/Time   PHART 7.491 (H) 11/06/2023 1528   HCO3 20.9 11/06/2023 1528   TCO2 22 11/06/2023 1528   ACIDBASEDEF 2.0 11/06/2023 1528   O2SAT 100 11/06/2023 1528   CBG (last 3)  Recent  Labs    11/08/23 0355 11/08/23 0805 11/08/23 1128  GLUCAP 96 104* 141*    Assessment/Plan: S/P Procedure(s) (LRB): CORONARY ARTERY BYPASS GRAFTING (CABG) TIMES THREE USING LEFT INTERNAL MAMMARY ARTERY AND ENDOSCOPICALLY HARVESTED RIGHT GREATER SAPHENOUS VEIN (N/A) ECHOCARDIOGRAM, TRANSESOPHAGEAL (N/A)  Neuro: Pain controlled  CV: SBP 94-109. NSR, HR 80s. On Lopressor 12.5mg  BID. Some ST elevations on tele but no chest pain and no pericardial rub to indicate pericarditis. Likely due to inflammation postop, will monitor.  Pulm: Saturating well on RA. CXR yesterday with small bilateral pleural effusions and bibasilar atelectasis. Will get follow up 2V CXR tomorrow. Encourage IS and ambulation.  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 No N/V, passing gas. -BM. Tolerating a diet. Continue bowel regimen.   Endo: Preop A1C 5.2, no hx of DM. CBGs 96/104/141. SSI and CBGs have been d/c'd. Hypothyroid, on home levothyroxine.   Renal: Cr 1.07. UO 3250cc/24hrs. +3lbs from preop weight. Continue PO Lasix 40mg  daily, On home Flomax for hx of BPH  Expected postop ABLA: Improving, H/H 8.4/24.6. On iron supplement. Reactive thrombocytopenia improving, plt 97,000. On ASA 81mg , lovenox has been held.  DVT Prophylaxis: Lovenox held due to thrombocytopenia  Dispo: Hopefully can d/c home next 24-48 hours    LOS: 5 days    Jenny Reichmann, PA-C 11/09/2023  Chart reviewed, patient examined, agree with above.  He is making good progress. No BM yet but on lactulose which should work. Probably home Tuesday.

## 2023-11-10 ENCOUNTER — Inpatient Hospital Stay (HOSPITAL_COMMUNITY)

## 2023-11-10 MED ORDER — METOPROLOL TARTRATE 25 MG PO TABS: 12.5000 mg | ORAL_TABLET | Freq: Two times a day (BID) | ORAL | 1 refills | Status: DC

## 2023-11-10 MED ORDER — TRAMADOL HCL 50 MG PO TABS: 50.0000 mg | ORAL_TABLET | Freq: Four times a day (QID) | ORAL | 0 refills | Status: AC | PRN

## 2023-11-10 MED ORDER — ATORVASTATIN CALCIUM 80 MG PO TABS: 80.0000 mg | ORAL_TABLET | Freq: Every day | ORAL | 1 refills | Status: DC

## 2023-11-10 NOTE — Care Management Important Message (Signed)
 Important Message  Patient Details  Name: David Jarvis MRN: [id number] Date of Birth: 23-Jan-1951   Important Message Given:  Yes - Medicare IM     Renie Ora 11/10/2023, 11:41 AM

## 2023-11-10 NOTE — Plan of Care (Signed)
  Problem: Education: Goal: Understanding of CV disease, CV risk reduction, and recovery process will improve Outcome: Adequate for Discharge Goal: Individualized Educational Video(s) Outcome: Adequate for Discharge   Problem: Activity: Goal: Ability to return to baseline activity level will improve Outcome: Adequate for Discharge   Problem: Cardiovascular: Goal: Ability to achieve and maintain adequate cardiovascular perfusion will improve Outcome: Adequate for Discharge Goal: Vascular access site(s) Level 0-1 will be maintained Outcome: Adequate for Discharge   Problem: Health Behavior/Discharge Planning: Goal: Ability to safely manage health-related needs after discharge will improve Outcome: Adequate for Discharge   Problem: Education: Goal: Knowledge of General Education information will improve Description: Including pain rating scale, medication(s)/side effects and non-pharmacologic comfort measures Outcome: Adequate for Discharge   Problem: Health Behavior/Discharge Planning: Goal: Ability to manage health-related needs will improve Outcome: Adequate for Discharge   Problem: Clinical Measurements: Goal: Ability to maintain clinical measurements within normal limits will improve Outcome: Adequate for Discharge Goal: Will remain free from infection Outcome: Adequate for Discharge Goal: Diagnostic test results will improve Outcome: Adequate for Discharge Goal: Respiratory complications will improve Outcome: Adequate for Discharge Goal: Cardiovascular complication will be avoided Outcome: Adequate for Discharge   Problem: Activity: Goal: Risk for activity intolerance will decrease Outcome: Adequate for Discharge   Problem: Nutrition: Goal: Adequate nutrition will be maintained Outcome: Adequate for Discharge   Problem: Coping: Goal: Level of anxiety will decrease Outcome: Adequate for Discharge   Problem: Elimination: Goal: Will not experience complications  related to bowel motility Outcome: Adequate for Discharge Goal: Will not experience complications related to urinary retention Outcome: Adequate for Discharge   Problem: Pain Managment: Goal: General experience of comfort will improve and/or be controlled Outcome: Adequate for Discharge   Problem: Safety: Goal: Ability to remain free from injury will improve Outcome: Adequate for Discharge   Problem: Skin Integrity: Goal: Risk for impaired skin integrity will decrease Outcome: Adequate for Discharge   Problem: Education: Goal: Will demonstrate proper wound care and an understanding of methods to prevent future damage Outcome: Adequate for Discharge Goal: Knowledge of disease or condition will improve Outcome: Adequate for Discharge Goal: Knowledge of the prescribed therapeutic regimen will improve Outcome: Adequate for Discharge Goal: Individualized Educational Video(s) Outcome: Adequate for Discharge   Problem: Activity: Goal: Risk for activity intolerance will decrease Outcome: Adequate for Discharge   Problem: Cardiac: Goal: Will achieve and/or maintain hemodynamic stability Outcome: Adequate for Discharge   Problem: Clinical Measurements: Goal: Postoperative complications will be avoided or minimized Outcome: Adequate for Discharge   Problem: Respiratory: Goal: Respiratory status will improve Outcome: Adequate for Discharge   Problem: Skin Integrity: Goal: Wound healing without signs and symptoms of infection Outcome: Adequate for Discharge Goal: Risk for impaired skin integrity will decrease Outcome: Adequate for Discharge   Problem: Urinary Elimination: Goal: Ability to achieve and maintain adequate renal perfusion and functioning will improve Outcome: Adequate for Discharge

## 2023-11-10 NOTE — Progress Notes (Signed)
 Pt is ambulating independently with RW in hall, none in room. He can get RW from his church. Discussed with pt IS, sternal precautions, exercise, diet, and CRPII. Pt receptive. Will refer to [place] CRPII. Eager to start.  [id number] Ethelda Chick BS, ACSM-CEP 11/10/2023 8:44 AM

## 2023-11-10 NOTE — TOC Transition Note (Signed)
 Transition of Care (TOC) - Discharge Note Donn Pierini RN, BSN Transitions of Care Unit 4E- RN Case Manager See Treatment Team for direct phone #   Patient Details  Name: David Jarvis MRN: [id number] Date of Birth: May 24, 1951  [facility] CM/SW Contact:  Darrold Span, RN Phone Number: 11/10/2023, 10:18 AM   Clinical Narrative:    Pt stable for transition home today. Family to transport home.  Per discharge nurse- pt voiced he does not need RW for home- declined DME need.   No HH needs noted.   Pt will follow up as per AVS instructions.    Final next level of care: Home/Self Care Barriers to Discharge: Barriers Resolved   Patient Goals and CMS Choice Patient states their goals for this hospitalization and ongoing recovery are:: return home CMS Medicare.gov Compare Post Acute Care list provided to:: Patient Choice offered to / list presented to : Patient      Discharge Placement                 Home      Discharge Plan and Services Additional resources added to the After Visit Summary for     Discharge Planning Services: CM Consult Post Acute Care Choice: Durable Medical Equipment, Home Health          DME Arranged: Walker rolling, Patient refused services DME Agency: NA       HH Arranged: NA HH Agency: NA        Social Drivers of Health (SDOH) Interventions SDOH Screenings   Food Insecurity: No Food Insecurity (11/04/2023)  Housing: Low Risk  (11/04/2023)  Transportation Needs: No Transportation Needs (11/04/2023)  Utilities: Not At Risk (11/04/2023)  Financial Resource Strain: Low Risk  (08/21/2022)   [facility]  Social Connections: Socially Integrated (11/05/2023)  Tobacco Use: Low Risk  (11/05/2023)     Readmission Risk Interventions    11/10/2023   10:18 AM  Readmission Risk Prevention Plan  Post Dischage Appt Complete  Medication Screening Complete   Transportation Screening Complete

## 2023-11-10 NOTE — Progress Notes (Signed)
 4 Days Post-Op Procedure(s) (LRB): CORONARY ARTERY BYPASS GRAFTING (CABG) TIMES THREE USING LEFT INTERNAL MAMMARY ARTERY AND ENDOSCOPICALLY HARVESTED RIGHT GREATER SAPHENOUS VEIN (N/A) ECHOCARDIOGRAM, TRANSESOPHAGEAL (N/A) Subjective: Feels well  Objective: Vital signs in last 24 hours: Temp:  [97.8 F (36.6 C)-98.7 F (37.1 C)] 98.3 F (36.8 C) (03/10 0500) Pulse Rate:  [76-99] 99 (03/10 0500) Cardiac Rhythm: Normal sinus rhythm (03/09 2100) Resp:  [14-16] 16 (03/10 0500) BP: (94-124)/(62-77) 99/62 (03/10 0500) SpO2:  [93 %-98 %] 98 % (03/10 0500) Weight:  [63.5 kg-65.1 kg] 63.5 kg (03/10 0500)  Hemodynamic parameters for last 24 hours:    Intake/Output from previous day: No intake/output data recorded. Intake/Output this shift: No intake/output data recorded.  General appearance: alert, cooperative, and no distress Heart: regular rate and rhythm Lungs: clear to auscultation bilaterally Abdomen: benign Extremities: no edema Wound: incis healing well  Lab Results: Recent Labs    11/08/23 0558 11/09/23 0432  WBC 6.3 6.2  HGB 8.1* 8.4*  HCT 23.1* 24.6*  PLT 77* 97*   BMET:  Recent Labs    11/08/23 0558 11/09/23 0432  NA 134* 139  K 4.0 4.5  CL 102 104  CO2 25 28  GLUCOSE 132* 126*  BUN 11 12  CREATININE 1.04 1.07  CALCIUM 7.8* 8.1*    PT/INR: No results for input(s): "LABPROT", "INR" in the last 72 hours. ABG    Component Value Date/Time   PHART 7.491 (H) 11/06/2023 1528   HCO3 20.9 11/06/2023 1528   TCO2 22 11/06/2023 1528   ACIDBASEDEF 2.0 11/06/2023 1528   O2SAT 100 11/06/2023 1528   CBG (last 3)  Recent Labs    11/08/23 0355 11/08/23 0805 11/08/23 1128  GLUCAP 96 104* 141*    Meds Scheduled Meds:  acetaminophen  1,000 mg Oral Q6H   Or   acetaminophen (TYLENOL) oral liquid 160 mg/5 mL  1,000 mg Per Tube Q6H   aspirin EC  81 mg Oral Daily   atorvastatin  80 mg Oral Daily   Fe Fum-Vit C-Vit B12-FA  1 capsule Oral QPC breakfast    furosemide  40 mg Oral Daily   levothyroxine  25 mcg Oral Q0600   metoprolol tartrate  12.5 mg Oral BID   pantoprazole  40 mg Oral Daily   potassium chloride  20 mEq Oral BID   senna-docusate  1 tablet Oral BID   sodium chloride flush  3 mL Intravenous Q12H   tamsulosin  0.4 mg Oral QPC supper   Continuous Infusions: PRN Meds:.lactulose, ondansetron (ZOFRAN) IV, oxyCODONE, sodium chloride flush, traMADol  Xrays No results found.  Assessment/Plan: S/P Procedure(s) (LRB): CORONARY ARTERY BYPASS GRAFTING (CABG) TIMES THREE USING LEFT INTERNAL MAMMARY ARTERY AND ENDOSCOPICALLY HARVESTED RIGHT GREATER SAPHENOUS VEIN (N/A) ECHOCARDIOGRAM, TRANSESOPHAGEAL (N/A) POD#4 CABG  1 afeb, S BP 94-124, NSR 2 O2 sats good on RA 3 UOP- not recorded, WT-now equal to preop, , stop lasix 4 BS controlled- not a diabetic 5 normal renal fxn on yesterdays labs 6 no new labs 7 CXR- minor atx, small effus 8 appears stable for discharge   LOS: 6 days    Rowe Clack PA-C Pager [phone] 11/10/2023

## 2023-11-10 NOTE — Plan of Care (Signed)
  Problem: Activity: Goal: Ability to return to baseline activity level will improve Outcome: Progressing   Problem: Cardiovascular: Goal: Ability to achieve and maintain adequate cardiovascular perfusion will improve Outcome: Progressing   Problem: Coping: Goal: Level of anxiety will decrease Outcome: Progressing   Problem: Pain Managment: Goal: General experience of comfort will improve and/or be controlled Outcome: Progressing   Problem: Safety: Goal: Ability to remain free from injury will improve Outcome: Progressing   Problem: Skin Integrity: Goal: Risk for impaired skin integrity will decrease Outcome: Progressing

## 2023-11-12 MED FILL — Heparin Sodium (Porcine) Inj 1000 Unit/ML: INTRAMUSCULAR | Qty: 10 | Status: AC

## 2023-11-12 MED FILL — Sodium Chloride IV Soln 0.9%: INTRAVENOUS | Qty: 2000 | Status: AC

## 2023-11-12 MED FILL — Mannitol IV Soln 20%: INTRAVENOUS | Qty: 1000 | Status: AC

## 2023-11-12 MED FILL — Lidocaine HCl Local Soln Prefilled Syringe 100 MG/5ML (2%): INTRAMUSCULAR | Qty: 5 | Status: AC

## 2023-11-12 MED FILL — Sodium Bicarbonate IV Soln 8.4%: INTRAVENOUS | Qty: 50 | Status: AC

## 2023-11-12 MED FILL — Electrolyte-R (PH 7.4) Solution: INTRAVENOUS | Qty: 5000 | Status: AC

## 2023-11-18 ENCOUNTER — Telehealth (HOSPITAL_COMMUNITY): Payer: Self-pay

## 2023-11-18 NOTE — Telephone Encounter (Signed)
 Attempted to call patient in regards to Cardiac Rehab - LM on VM

## 2023-11-18 NOTE — Telephone Encounter (Signed)
 Pt insurance is active and benefits verified through [facility]. Co-pay $40.00, DED $0.00/$0.00 met, out of pocket $4,150.00/$130.00 met, co-insurance 0%. No pre-authorization required. Passport, 11/18/23 @ 11:49AM, REF#[id number]   How many CR sessions are covered? (36 visits for TCR, 72 visits for ICR)72 Is this a lifetime maximum or an annual maximum? Annual Has the member used any of these services to date? No Is there a time limit (weeks/months) on start of program and/or program completion? No     Will contact patient to see if he is interested in the Cardiac Rehab Program. If interested, patient will need to complete follow up appt. Once completed, patient will be contacted for scheduling upon review by the RN Navigator.

## 2023-11-19 DIAGNOSIS — N1831 Chronic kidney disease, stage 3a: Secondary | ICD-10-CM | POA: Diagnosis not present

## 2023-11-19 DIAGNOSIS — D649 Anemia, unspecified: Secondary | ICD-10-CM | POA: Diagnosis not present

## 2023-11-19 LAB — LAB REPORT - SCANNED: EGFR: 75

## 2023-11-28 ENCOUNTER — Other Ambulatory Visit: Payer: Self-pay

## 2023-11-28 ENCOUNTER — Encounter: Payer: Self-pay | Admitting: Nurse Practitioner

## 2023-11-28 ENCOUNTER — Ambulatory Visit: Attending: Nurse Practitioner | Admitting: Nurse Practitioner

## 2023-11-28 VITALS — BP 112/68 | HR 54 | Ht 66.0 in | Wt 136.0 lb

## 2023-11-28 DIAGNOSIS — I10 Essential (primary) hypertension: Secondary | ICD-10-CM

## 2023-11-28 DIAGNOSIS — I2510 Atherosclerotic heart disease of native coronary artery without angina pectoris: Secondary | ICD-10-CM

## 2023-11-28 DIAGNOSIS — Z951 Presence of aortocoronary bypass graft: Secondary | ICD-10-CM | POA: Diagnosis not present

## 2023-11-28 DIAGNOSIS — M791 Myalgia, unspecified site: Secondary | ICD-10-CM | POA: Diagnosis not present

## 2023-11-28 DIAGNOSIS — E785 Hyperlipidemia, unspecified: Secondary | ICD-10-CM

## 2023-11-28 DIAGNOSIS — T466X5D Adverse effect of antihyperlipidemic and antiarteriosclerotic drugs, subsequent encounter: Secondary | ICD-10-CM

## 2023-11-28 DIAGNOSIS — T466X5A Adverse effect of antihyperlipidemic and antiarteriosclerotic drugs, initial encounter: Secondary | ICD-10-CM

## 2023-11-28 MED ORDER — ROSUVASTATIN CALCIUM 20 MG PO TABS: 20.0000 mg | ORAL_TABLET | Freq: Every day | ORAL | 3 refills | Status: AC

## 2023-11-28 NOTE — Patient Instructions (Signed)
 Medication Instructions:  Stop Lipitor as directed Start Crestor 20 mg daily *If you need a refill on your cardiac medications before your next appointment, please call your pharmacy*  Lab Work: Fasting lipid panel & LFTs in 6-8 weeks  Testing/Procedures: NONE ordered at this time of appointment    Follow-Up: At [facility], you and your health needs are our priority.  As part of our continuing mission to provide you with exceptional heart care, our providers are all part of one team.  This team includes your primary Cardiologist (physician) and Advanced Practice Providers or APPs (Physician Assistants and Nurse Practitioners) who all work together to provide you with the care you need, when you need it.  Your next appointment:    Keep follow up   Provider:   Thurmon Fair, MD     We recommend signing up for the patient portal called "MyChart".  Sign up information is provided on this After Visit Summary.  MyChart is used to connect with patients for Virtual Visits (Telemedicine).  Patients are able to view lab/test results, encounter notes, upcoming appointments, etc.  Non-urgent messages can be sent to your provider as well.   To learn more about what you can do with MyChart, go to [email or web address].   Other Instructions Monitor Blood pressure. Report systolic bp (top number) consistently less than 100.       1st Floor: - Lobby - Registration  - Pharmacy  - Lab - Cafe  2nd Floor: - PV Lab - Diagnostic Testing (echo, CT, nuclear med)  3rd Floor: - Vacant  4th Floor: - TCTS (cardiothoracic surgery) - AFib Clinic - Structural Heart Clinic - Vascular Surgery  - Vascular Ultrasound  5th Floor: - HeartCare Cardiology (general and EP) - Clinical Pharmacy for coumadin, hypertension, lipid, weight-loss medications, and med management appointments    Valet parking services will be available as well.

## 2023-11-28 NOTE — Progress Notes (Addendum)
 Office Visit    Patient Name: David Jarvis Date of Encounter: 11/28/2023  Primary Care Provider:  Georgann Housekeeper, MD Primary Cardiologist:  Thurmon Fair, MD  Chief Complaint    73 year old male with a history of CAD s/p CABG x 3 (LIMA-LAD, SVG-diagonal, SVG-OM), in 11/2023, hypertension, hyperlipidemia, BPH, and pancreatic adenocarcinoma s/p chemotherapy and Whipple procedure in 2023 who presents for hospital follow-up related to CAD s/p CABG x 3.  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   right temple (MOHS)   BPV (benign positional vertigo)    Colon polyps    GERD (gastroesophageal reflux disease)    Glucose intolerance (impaired glucose tolerance)    History of nuclear stress test    ETT-Myoview 2/18: EF 57, no ischemia, low risk   Hyperlipidemia    Hypertension    Melanoma (HCC) 07/22/2008   RIGHT TEMPLE END STAGE LICHENORO REGRESSION MELANODERMA TX MOHS   Right knee pain    SCC (squamous cell carcinoma) 02/24/2013   left inner forearm ([id number])   Vitreous floaters of both eyes    Past Surgical History:  Procedure Laterality Date   ARTHROSCOPIC REPAIR ACL Right    BILIARY BRUSHING  10/06/2020   Procedure: BILIARY BRUSHING;  Surgeon: Kerin Salen, MD;  Location: [facility];  Service: Gastroenterology;;   BILIARY STENT PLACEMENT N/A 05/23/2021   Procedure: BILIARY STENT PLACEMENT;  Surgeon: Kerin Salen, MD;  Location: [facility];  Service: Gastroenterology;  Laterality: N/A;   cardiac stress test  08/2008   COLONOSCOPY W/ POLYPECTOMY  2006   CORONARY ARTERY BYPASS GRAFT N/A 11/06/2023   Procedure: CORONARY ARTERY BYPASS GRAFTING (CABG) TIMES THREE USING LEFT INTERNAL MAMMARY ARTERY AND ENDOSCOPICALLY HARVESTED RIGHT GREATER SAPHENOUS VEIN;  Surgeon: Loreli Slot, MD;  Location: [facility];  Service: Open Heart Surgery;   Laterality: N/A;   ENDOSCOPIC RETROGRADE CHOLANGIOPANCREATOGRAPHY (ERCP) WITH PROPOFOL N/A 05/23/2021   Procedure: ENDOSCOPIC RETROGRADE CHOLANGIOPANCREATOGRAPHY (ERCP) WITH PROPOFOL;  Surgeon: Kerin Salen, MD;  Location: [facility];  Service: Gastroenterology;  Laterality: N/A;   ERCP N/A 10/06/2020   Procedure: ENDOSCOPIC RETROGRADE CHOLANGIOPANCREATOGRAPHY (ERCP);  Surgeon: Kerin Salen, MD;  Location: [facility];  Service: Gastroenterology;  Laterality: N/A;   EUS N/A 05/23/2021   Procedure: UPPER ENDOSCOPIC ULTRASOUND (EUS) LINEAR;  Surgeon: Willis Modena, MD;  Location: [facility];  Service: Endoscopy;  Laterality: N/A;   FINE NEEDLE ASPIRATION N/A 05/23/2021   Procedure: FINE NEEDLE ASPIRATION (FNA) LINEAR;  Surgeon: Willis Modena, MD;  Location: [facility];  Service: Endoscopy;  Laterality: N/A;   I & D EXTREMITY Left 02/03/2020   Procedure: IRRIGATION AND DEBRIDEMENT EXTREMITY;  Surgeon: Knute Neu, MD;  Location: [facility];  Service: Plastics;  Laterality: Left;   IR IMAGING GUIDED PORT INSERTION  06/11/2021   IR REMOVAL TUN ACCESS W/ PORT W/O FL MOD SED  04/03/2022   laser surgery of the eye     LEFT HEART CATH AND CORONARY ANGIOGRAPHY N/A 11/04/2023   Procedure: LEFT HEART CATH AND CORONARY ANGIOGRAPHY;  Surgeon: Tonny Bollman, MD;  Location: [facility] CV [facility];  Service: Cardiovascular;  Laterality: N/A;   SPHINCTEROTOMY  10/06/2020   Procedure: SPHINCTEROTOMY;  Surgeon: Kerin Salen, MD;  Location: [facility];  Service: Gastroenterology;;  balloon sweep   TEE WITHOUT CARDIOVERSION N/A 11/06/2023   Procedure: ECHOCARDIOGRAM, TRANSESOPHAGEAL;  Surgeon: Loreli Slot, MD;  Location: [facility];  Service: Open Heart Surgery;  Laterality: N/A;    Allergies  Allergies  Allergen Reactions   Ace Inhibitors Cough   Lisinopril Cough   Lisinopril-Hydrochlorothiazide Cough     Labs/Other Studies Reviewed    The following studies were reviewed today:  Cardiac Studies &  Procedures   ______________________________________________________________________________________________ CARDIAC CATHETERIZATION  CARDIAC CATHETERIZATION 11/04/2023  Narrative 1.  Critical heavily calcified proximal LAD stenosis 2.  Critical heavily calcified ostial left circumflex stenosis 3.  Patent RCA with mild nonobstructive plaquing in the main vessel and moderate 50% stenosis at the origin of the PDA, likely not flow-limiting 4.  Normal LVEF with no regional wall motion abnormalities and normal LVEDP  Recommendations: I think the patient has surgical anatomy and would do best with CABG, likely with a LIMA to LAD and bypass graft to one of his obtuse marginals.  He will be admitted for IV heparin in the setting of high risk anatomy and cardiac surgical consultation will be obtained.  Findings Coronary Findings Diagnostic  Dominance: Right  Left Main There is mild diffuse disease throughout the vessel.  Left Anterior Descending The LAD has critical heavily calcified subtotal occlusion in the proximal vessel at the origin of the first diagonal.  Beyond that area of the LAD is patent with mild nonobstructive plaque and appears to be suitable for grafting. Prox LAD to Mid LAD lesion is 99% stenosed.  Left Circumflex The circumflex has critical stenosis as it originates from the left main with severe eccentric heavily calcified plaque with a spiral filling pattern.  There are 2 marginal branches with mild nonobstructive plaque that would be likely suitable for bypass grafting. Ost Cx to Prox Cx lesion is 95% stenosed.  Right Coronary Artery There is mild diffuse disease throughout the vessel. Dominant vessel, mild diffuse plaquing throughout the proximal, mid, and distal segments.  The PDA has a 50% proximal stenosis that does not appear flow-limiting.  Right Posterior Descending Artery RPDA lesion is 50% stenosed.  Intervention  No interventions have been  documented.   STRESS TESTS  MYOCARDIAL PERFUSION IMAGING 08/22/2008   ECHOCARDIOGRAM  ECHOCARDIOGRAM COMPLETE 11/04/2023  Narrative ECHOCARDIOGRAM REPORT    Patient Name:   OLUWADAMILARE TOBLER Date of Exam: 11/04/2023 Medical Rec #:  [id number]     Height:       66.0 in Accession #:    [id number]    Weight:       140.0 lb Date of Birth:  08/03/1951     BSA:          1.719 m Patient Age:    72 years      BP:           118/74 mmHg Patient Gender: M             HR:           52 bpm. Exam Location:  Inpatient  Procedure: 2D Echo, Cardiac Doppler, Color Doppler and Intracardiac Opacification Agent (Both Spectral and Color Flow Doppler were utilized during procedure).  Indications:    CAD Native Vessel  History:        Patient has no prior history of Echocardiogram examinations. CAD; Risk Factors:Hypertension and Dyslipidemia.  Sonographer:    Karma Ganja Referring Phys: Loreli Slot   Sonographer Comments: Image acquisition challenging due to patient body habitus. IMPRESSIONS   1. Left ventricular ejection fraction, by estimation, is 50 to 55%. The left ventricle has low normal function. The left  ventricle demonstrates regional wall motion abnormalities (see scoring diagram/findings for description). Left ventricular diastolic parameters were normal. 2. Right ventricular systolic function is normal. The right ventricular size is normal. There is normal pulmonary artery systolic pressure. The estimated right ventricular systolic pressure is 33.9 mmHg. 3. The mitral valve is grossly normal. Trivial mitral valve regurgitation. No evidence of mitral stenosis. 4. The aortic valve is tricuspid. There is mild calcification of the aortic valve. Aortic valve regurgitation is mild. Aortic valve sclerosis is present, with no evidence of aortic valve stenosis. 5. The inferior vena cava is normal in size with greater than 50% respiratory variability, suggesting right atrial pressure of 3  mmHg.  FINDINGS Left Ventricle: Left ventricular ejection fraction, by estimation, is 50 to 55%. The left ventricle has low normal function. The left ventricle demonstrates regional wall motion abnormalities. Definity contrast agent was given IV to delineate the left ventricular endocardial borders. The left ventricular internal cavity size was normal in size. There is no left ventricular hypertrophy. Left ventricular diastolic parameters were normal.   LV Wall Scoring: The mid and distal anterior septum, mid inferoseptal segment, and apex are hypokinetic.  Right Ventricle: The right ventricular size is normal. No increase in right ventricular wall thickness. Right ventricular systolic function is normal. There is normal pulmonary artery systolic pressure. The tricuspid regurgitant velocity is 2.78 m/s, and with an assumed right atrial pressure of 3 mmHg, the estimated right ventricular systolic pressure is 33.9 mmHg.  Left Atrium: Left atrial size was normal in size.  Right Atrium: Right atrial size was normal in size.  Pericardium: There is no evidence of pericardial effusion.  Mitral Valve: The mitral valve is grossly normal. Mild to moderate mitral annular calcification. Trivial mitral valve regurgitation. No evidence of mitral valve stenosis.  Tricuspid Valve: The tricuspid valve is grossly normal. Tricuspid valve regurgitation is trivial. No evidence of tricuspid stenosis.  Aortic Valve: The aortic valve is tricuspid. There is mild calcification of the aortic valve. Aortic valve regurgitation is mild. Aortic valve sclerosis is present, with no evidence of aortic valve stenosis. Aortic valve mean gradient measures 4.0 mmHg. Aortic valve peak gradient measures 6.9 mmHg. Aortic valve area, by VTI measures 2.76 cm.  Pulmonic Valve: The pulmonic valve was grossly normal. Pulmonic valve regurgitation is not visualized. No evidence of pulmonic stenosis.  Aorta: The aortic root is normal  in size and structure.  Venous: The inferior vena cava is normal in size with greater than 50% respiratory variability, suggesting right atrial pressure of 3 mmHg.  IAS/Shunts: The atrial septum is grossly normal.   LEFT VENTRICLE PLAX 2D LVIDd:         4.70 cm   Diastology LVIDs:         3.10 cm   LV e' medial:    9.14 cm/s LV PW:         0.80 cm   LV E/e' medial:  8.7 LV IVS:        0.80 cm   LV e' lateral:   10.90 cm/s LVOT diam:     2.00 cm   LV E/e' lateral: 7.3 LV SV:         91 LV SV Index:   53 LVOT Area:     3.14 cm   RIGHT VENTRICLE RV Basal diam:  3.80 cm RV S prime:     15.20 cm/s TAPSE (M-mode): 2.6 cm  LEFT ATRIUM  Index        RIGHT ATRIUM           Index LA diam:        3.20 cm 1.86 cm/m   RA Area:     13.90 cm LA Vol (A2C):   50.0 ml 29.09 ml/m  RA Volume:   31.40 ml  18.27 ml/m LA Vol (A4C):   25.2 ml 14.66 ml/m LA Biplane Vol: 38.2 ml 22.23 ml/m AORTIC VALVE AV Area (Vmax):    3.41 cm AV Area (Vmean):   2.47 cm AV Area (VTI):     2.76 cm AV Vmax:           131.00 cm/s AV Vmean:          94.100 cm/s AV VTI:            0.330 m AV Peak Grad:      6.9 mmHg AV Mean Grad:      4.0 mmHg LVOT Vmax:         142.00 cm/s LVOT Vmean:        73.900 cm/s LVOT VTI:          0.290 m LVOT/AV VTI ratio: 0.88  AORTA Ao Root diam: 3.60 cm  MITRAL VALVE                TRICUSPID VALVE MV Area (PHT): 3.08 cm     TR Peak grad:   30.9 mmHg MV Decel Time: 246 msec     TR Vmax:        278.00 cm/s MV E velocity: 79.70 cm/s MV A velocity: 106.00 cm/s  SHUNTS MV E/A ratio:  0.75         Systemic VTI:  0.29 m Systemic Diam: 2.00 cm  Lennie Odor MD Electronically signed by Lennie Odor MD Signature Date/Time: 11/04/2023/3:34:28 PM    Final   TEE  ECHO INTRAOPERATIVE TEE 11/06/2023  Narrative *INTRAOPERATIVE TRANSESOPHAGEAL REPORT *    Patient Name:   QAMAR AUGHENBAUGH  Date of Exam: 11/06/2023 Medical Rec #:  [id number]      Height:       66.0  in Accession #:    [id number]     Weight:       140.0 lb Date of Birth:  10-21-1950      BSA:          1.72 m Patient Age:    72 years       BP:           107/58 mmHg Patient Gender: M              HR:           63 bpm. Exam Location:  Anesthesiology  Transesophogeal exam was perform intraoperatively during surgical procedure. Patient was closely monitored under general anesthesia during the entirety of examination.  Indications:     CAD Performing Phys: [id number] STEVEN C HENDRICKSON  Complications: No known complications during this procedure. POST-OP IMPRESSIONS _ Left Ventricle: has normal systolic function, with an ejection fraction of 65%. The cavity size was normal. _ Right Ventricle: The right ventricle appears unchanged from pre-bypass normal function. _ Aorta: The aorta appears unchanged from pre-bypass. _ Left Atrial Appendage: The left atrial appendage appears unchanged from pre-bypass. _ Aortic Valve: There is mild regurgitation. _ Mitral Valve: There is mild regurgitation. _ Tricuspid Valve: There is mild regurgitation. _ Pulmonic Valve: The pulmonic valve appears unchanged from pre-bypass. _ Interatrial Septum: The  interatrial septum appears unchanged from pre-bypass. _ Pericardium: The pericardium appears unchanged from pre-bypass. _ Comments: Post-bypass images reviewed with surgeon.  PRE-OP FINDINGS Left Ventricle: The left ventricle has normal systolic function, with an ejection fraction of 60-65%. The cavity size was normal. There is no left ventricular hypertrophy.   Right Ventricle: The right ventricle has normal systolic function. The cavity was normal. There is no increase in right ventricular wall thickness.  Left Atrium: Left atrial size was normal in size. No left atrial/left atrial appendage thrombus was detected. Left atrial appendage velocity is normal at greater than 40 cm/s.  Right Atrium: Right atrial size was normal in size.  Interatrial Septum:  Evidence of atrial level shunting detected by color flow Doppler.  Pericardium: There is no evidence of pericardial effusion.  Mitral Valve: The mitral valve is normal in structure. Mitral valve regurgitation is mild by color flow Doppler.  Tricuspid Valve: The tricuspid valve was normal in structure. Tricuspid valve regurgitation is trivial by color flow Doppler.  Aortic Valve: The aortic valve is tricuspid Aortic valve regurgitation is mild by color flow Doppler. There is no stenosis of the aortic valve. There is mild calcification present.  Pulmonic Valve: The pulmonic valve was normal in structure. Pulmonic valve regurgitation is not visualized by color flow Doppler.   Aorta: The aortic root, ascending aorta and aortic arch are normal in size and structure.  Pulmonary Artery: The pulmonary artery is of normal size.  +--------------+--------++ LEFT VENTRICLE         +--------------+--------++ PLAX 2D                +--------------+--------++ LVIDd:        4.60 cm  +--------------+--------++ LVIDs:        2.60 cm  +--------------+--------++ LVOT diam:    2.50 cm  +--------------+--------++ LV SV:        73 ml    +--------------+--------++ LV SV Index:  42.22    +--------------+--------++ LVOT Area:    4.91 cm +--------------+--------++                        +--------------+--------++   +-------------+-------++ AORTA                +-------------+-------++ Ao Root diam:3.60 cm +-------------+-------++   +--------------+-------+ SHUNTS                +--------------+-------+ Systemic Diam:2.50 cm +--------------+-------+   Arrie Aran MD Electronically signed by Arrie Aran MD Signature Date/Time: 11/06/2023/5:57:26 PM    Final        ______________________________________________________________________________________________     Recent Labs: 11/07/2023: Magnesium 2.6 11/09/2023: BUN 12;  Creatinine, Ser 1.07; Hemoglobin 8.4; Platelets 97; Potassium 4.5; Sodium 139  Recent Lipid Panel    Component Value Date/Time   CHOL 83 11/06/2023 0439   TRIG 48 11/06/2023 0439   HDL 30 (L) 11/06/2023 0439   CHOLHDL 2.8 11/06/2023 0439   VLDL 10 11/06/2023 0439   LDLCALC 43 11/06/2023 0439    History of Present Illness    73 year old male with the above past medical history including CAD s/p CABG x 3 (LIMA-LAD, SVG-diagonal, SVG-OM), in 11/2023, hypertension, hyperlipidemia, BPH, and pancreatic adenocarcinoma s/p chemotherapy and Whipple procedure in 2023.   He was referred to Dr. Royann Shivers in the setting of exertional angina.  He had evidence of coronary artery calcification on prior CT chest.  He was last seen in the office on 10/29/2023 and reported chest discomfort  when biking uphill.  Cardiac catheterization revealed severe coronary artery stenosis in the proximal LAD, LCx, minor nonobstructive irregularities in the RCA, normal LV function, normal LVEDP.  He was admitted in the setting of crescendo angina.  CT surgery was consulted and he underwent CABG x 3 (LIMA-LAD, SVG-diagonal, SVG-OM on 11/06/2023.  Pre-CABG Dopplers showed only minimal wall thickening or plaque bilaterally.  Echocardiogram showed  EF 50 to 55%, low normal LV function, RWMA, normal RV systolic function, mild aortic valve regurgitation. His postop course was uncomplicated.  He was discharged home in stable condition on 11/10/2023.  He presents today for follow-up accompanied by his wife.  Since his hospitalization he has been stable overall from a cardiac standpoint, though he does report significantly decreased energy levels, he feels "exhausted."  He denies any chest pain, dizziness, presyncope, syncope, dyspnea, edema, PND, orthopnea, weight gain.  He notes he had labs checked with his PCP who noted ongoing anemia.  He discussed possible initiation of iron supplements, however this was deferred.  He was encouraged to  increase dietary intake of iron rich foods.  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  BP initially low in office today, improved with recheck, close to baseline. He has not been checking his blood pressure. He questions whether or not his symptoms could be related to increase Lipitor dosing. Lastly, he repots that he has lost some weight, this is concerning to him as he has difficulty gaining weight post Whipple procedure.  Home Medications    Current Outpatient Medications  Medication Sig Dispense Refill   aspirin EC 81 MG tablet Take 81 mg by mouth daily.     levothyroxine (SYNTHROID) 25 MCG tablet Take 1 tablet by mouth daily before breakfast 90 tablet 3   metoprolol tartrate (LOPRESSOR) 25 MG tablet Take 0.5 tablets (12.5 mg total) by mouth 2 (two) times daily. 30 tablet 1   pantoprazole (PROTONIX) 40 MG tablet TAKE ONE TABLET BY MOUTH EVERY DAY 90 days 90 tablet 3   rosuvastatin (CRESTOR) 20 MG tablet Take 1 tablet (20 mg total) by mouth daily. 90 tablet 3   tamsulosin (FLOMAX) 0.4 MG CAPS capsule Take 1 capsule by mouth once daily 90 capsule 4   No current facility-administered medications for this visit.   Facility-Administered Medications Ordered in Other Visits  Medication Dose Route Frequency Provider Last Rate Last Admin   sodium chloride flush (NS) 0.9 % injection 10 mL  10 mL Intravenous PRN Rana Snare, NP   10 mL at 01/21/22 0931     Review of Systems  He denies chest pain, palpitations, dyspnea, pnd, orthopnea, n, v, dizziness, syncope, edema, weight gain, or early satiety. All other systems reviewed and are otherwise negative except as noted above.    Cardiac Rehabilitation Eligibility Assessment  The patient is ready to start cardiac rehabilitation pending clearance from the cardiac surgeon.    Physical Exam    VS:  BP 112/68   Pulse (!) 54   Ht 5\' 6"   (1.676 m)   Wt 136 lb (61.7 kg)   BMI 21.95 kg/m  GEN: Well nourished, well developed, in no acute distress. HEENT: normal. Neck: Supple, no JVD, carotid bruits, or masses. Cardiac: RRR, no murmurs, rubs, or gallops. No clubbing, cyanosis, edema.  Radials/DP/PT 2+ and equal bilaterally.  Right radial cath site with bruising, in the healing stages, no bleeding, bruit,  or hematoma. Respiratory:  Respirations regular and unlabored, clear to auscultation bilaterally. GI: Soft, nontender, nondistended, BS + x 4. MS: no deformity or atrophy. Skin: warm and dry, no rash.  Midsternal incision, dry, intact and well-approximated. Neuro:  Strength and sensation are intact. Psych: Normal affect.  Accessory Clinical Findings    ECG personally reviewed by me today - EKG Interpretation Date/Time:  Friday November 28 2023 11:01:36 EDT Ventricular Rate:  54 PR Interval:  148 QRS Duration:  90 QT Interval:  430 QTC Calculation: 407 R Axis:   70  Text Interpretation: Sinus bradycardia T wave abnormality, consider inferior ischemia When compared with ECG of 07-Nov-2023 06:47, ST no longer elevated in Inferior leads ST no longer elevated in Anterolateral leads T wave inversion now evident in Inferior leads T wave inversion now evident in Lateral leads Confirmed by Bernadene Person ([id number]) on 11/28/2023 11:04:26 AM  - no acute changes.   Lab Results  Component Value Date   WBC 6.2 11/09/2023   HGB 8.4 (L) 11/09/2023   HCT 24.6 (L) 11/09/2023   MCV 89.8 11/09/2023   PLT 97 (L) 11/09/2023   Lab Results  Component Value Date   CREATININE 1.07 11/09/2023   BUN 12 11/09/2023   NA 139 11/09/2023   K 4.5 11/09/2023   CL 104 11/09/2023   CO2 28 11/09/2023   Lab Results  Component Value Date   ALT 67 (H) 06/07/2022   AST 63 (H) 06/07/2022   ALKPHOS 369 (H) 06/07/2022   BILITOT 0.7 06/07/2022   Lab Results  Component Value Date   CHOL 83 11/06/2023   HDL 30 (L) 11/06/2023   LDLCALC 43 11/06/2023    TRIG 48 11/06/2023   CHOLHDL 2.8 11/06/2023    Lab Results  Component Value Date   HGBA1C 5.2 11/05/2023    Assessment & Plan    1. CAD: S/p CABG x 3 (LIMA-LAD, SVG-diagonal, SVG-OM) in 11/2023. Echocardiogram showed  EF 50 to 55%, low normal LV function, RWMA, normal RV systolic function, mild aortic valve regurgitation. His postop course was uncomplicated. However, since his hospital discharge, he reports significantly decreased energy levels.  This is a dramatic change for him as he was previously very active.  He denies symptoms concerning for angina. Euvolemic and well compensated on exam. Recent CBC with PCP revealed ongoing mild anemia, possible initiation of iron supplementation was discussed, however, this was deferred and he was encouraged to increase his dietary intake of iron rich foods.  Will request copy of most recent labs.  He questions whether or not his symptoms could be related to increased Lipitor dosing.  Will transition to Crestor as below.  Monitor BP as below.  Encouraged increase activity as tolerated, adequate oral intake.  Continue aspirin, metoprolol, Crestor. Follow-up with CT surgery as scheduled.   2. Hypertension: BP initially low in office today, improved with recheck. Continue to monitor BP report SBP consistently less than 100.    3. Hyperlipidemia/myalgia with statin therapy: LDL was 43 in 11/2023.  He notes extreme fatigue since the time of his hospital discharge, worsening arthralgias/myalgias.  He questions whether or not his symptoms could be related to increase Lipitor dosing.  Through shared decision making, will stop Lipitor and start Crestor 20 mg daily.  Plan for repeat fasting lipids, CMET in 6 to 8 weeks.  If he does not tolerate statin therapy, plan for referral to lipid clinic Pharm.D. for consideration of PCSK9 inhibitor.  4. Disposition: Follow-up as scheduled with  CT surgery, follow-up as scheduled with Dr. Royann Shivers in 01/2024.    Joylene Grapes,  NP 11/30/2023, 9:26 AM

## 2023-11-30 ENCOUNTER — Encounter: Payer: Self-pay | Admitting: Nurse Practitioner

## 2023-11-30 ENCOUNTER — Telehealth: Payer: Self-pay | Admitting: Cardiology

## 2023-11-30 NOTE — Telephone Encounter (Signed)
   Patient and spouse called today with concern of acute onset fever up to 103F this afternoon. Patient is s/p CABG earlier this month and has had myalgias which he attributes to increased Atorvastatin dosing. This was switched to Crestor in clinic on 03/28 but patient has not yet started taking, has now been off statin regimen x2 days. Muscle aches and fatigued admittedly worse today per patient. Encouraged patient and spouse to use supportive care such as tylenol as this sounds like a possible illness and consider urgent care eval today if they feel that symptoms are worsening.   Perlie Gold, PA-C

## 2023-12-08 ENCOUNTER — Telehealth: Payer: Self-pay | Admitting: *Deleted

## 2023-12-08 NOTE — Telephone Encounter (Signed)
 Patient contacted answering service over the weekend. States his right leg became edematous Sunday. Spoke with on call provider who advised to call our office again in the morning. Patient states this morning leg swelling has gone down. Advised patient to elevate legs are rest and to eat a low sodium diet. Denies pain or issues with incisions. Patient advised to contact has a follow up appt next week to be seen by MD. Advised to call if any other concerns arise.

## 2023-12-09 ENCOUNTER — Other Ambulatory Visit: Payer: Self-pay | Admitting: Thoracic Surgery (Cardiothoracic Vascular Surgery)

## 2023-12-09 DIAGNOSIS — Z951 Presence of aortocoronary bypass graft: Secondary | ICD-10-CM

## 2023-12-16 ENCOUNTER — Encounter: Payer: Self-pay | Admitting: Thoracic Surgery (Cardiothoracic Vascular Surgery)

## 2023-12-16 ENCOUNTER — Ambulatory Visit (INDEPENDENT_AMBULATORY_CARE_PROVIDER_SITE_OTHER): Payer: Self-pay | Admitting: Thoracic Surgery (Cardiothoracic Vascular Surgery)

## 2023-12-16 ENCOUNTER — Ambulatory Visit
Admission: RE | Admit: 2023-12-16 | Discharge: 2023-12-16 | Disposition: A | Discharge status: Home / Self Care | Source: Ambulatory Visit | Attending: Thoracic Surgery (Cardiothoracic Vascular Surgery) | Admitting: Thoracic Surgery (Cardiothoracic Vascular Surgery)

## 2023-12-16 VITALS — BP 132/75 | HR 57 | Resp 18 | Ht 66.0 in

## 2023-12-16 DIAGNOSIS — Z951 Presence of aortocoronary bypass graft: Secondary | ICD-10-CM

## 2023-12-16 NOTE — Progress Notes (Signed)
 [address]       [name] [address]             [phone]     HPI: [name] returns for follow-up after recent coronary bypass grafting.  David Jarvis is a 73 year old male with a history of hypertension, hyperlipidemia, melanoma, pancreatic cancer status post Whipple, vertigo, reflux, impaired glucose tolerance, and coronary artery disease.  He is very active physically.  He presented with chest pain while mountain biking.  He was found to have severe two-vessel coronary disease with preserved left trickle function.  He underwent coronary bypass grafting x 3 on 11/06/2023.  His postoperative course was uncomplicated and he went home on day 4.  Overall he feels well.  He noticed significant improvement after about 4 weeks.  Has some incisional discomfort but is not requiring any narcotics.  Primary complaint is right shoulder pain in the trapezius area.  He was on 80 mg of Lipitor.  That was held for a couple of days and his pain got better.  He was started back on Crestor and noticed that the pain worsened again.  He had some swelling in his right leg about a week ago.  That lasted 2 days and then went away and has not returned.  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   right temple (MOHS)   BPV (benign positional vertigo)    Colon polyps    GERD (gastroesophageal reflux disease)    Glucose intolerance (impaired glucose tolerance)    History of nuclear stress test    ETT-Myoview 2/18: EF 57, no ischemia, low risk   Hyperlipidemia    Hypertension    Melanoma (HCC) 07/22/2008   RIGHT TEMPLE END STAGE LICHENORO REGRESSION MELANODERMA TX MOHS   Right knee pain    SCC (squamous cell carcinoma) 02/24/2013   left inner forearm (Cx35FU)   Vitreous floaters of both eyes     Current Outpatient Medications  Medication Sig Dispense Refill    aspirin EC 81 MG tablet Take 81 mg by mouth daily.     levothyroxine (SYNTHROID) 25 MCG tablet Take 1 tablet by mouth daily before breakfast 90 tablet 3   metoprolol tartrate (LOPRESSOR) 25 MG tablet Take 0.5 tablets (12.5 mg total) by mouth 2 (two) times daily. 30 tablet 1   pantoprazole (PROTONIX) 40 MG tablet TAKE ONE TABLET BY MOUTH EVERY DAY 90 days 90 tablet 3   rosuvastatin (CRESTOR) 20 MG tablet Take 1 tablet (20 mg total) by mouth daily. 90 tablet 3   tamsulosin (FLOMAX) 0.4 MG CAPS capsule Take 1 capsule by mouth once daily 90 capsule 4   No current facility-administered medications for this visit.   Facility-Administered Medications Ordered in Other Visits  Medication Dose Route Frequency Provider Last Rate Last Admin   sodium chloride flush (NS) 0.9 % injection 10 mL  10 mL Intravenous PRN Rana Snare, NP   10 mL at 01/21/22 0931    Physical Exam BP 132/75 (BP Location: Left Arm)   Pulse (!) 57   Resp 18   Ht 5\' 6"  (1.676 m)   SpO2 98%   BMI 21.57 kg/m  73 year old man in no acute distress Alert and oriented x 3 with no focal deficits Lungs clear Cardiac regular rate and rhythm Sternum stable, incision well-healed No peripheral edema  Diagnostic Tests: CHEST -  2 VIEW   COMPARISON:  11/10/2023   FINDINGS: Median sternotomy noted for recent coronary bypass. Normal heart size and vascularity. Nearly resolved bibasilar atelectasis pattern. Resolved small effusions. No pneumothorax or acute airspace process. Trachea midline.   IMPRESSION: 1. Nearly resolved bibasilar atelectasis pattern. 2. Resolved small effusions. 3. Overall improved compared to 11/10/2023.     Electronically Signed   By: Melven Stable.  Shick M.D.   On: 12/16/2023 09:09  Impression: David Jarvis is a 73 year old male with a history of hypertension, hyperlipidemia, melanoma, pancreatic cancer status post Whipple, vertigo, reflux, impaired glucose tolerance, and coronary artery disease.  Two-vessel  coronary disease status post coronary bypass grafting-overall he is doing well.  Exercise tolerance is good and will continue to improve with time.  Minimal sternal discomfort.  Also should improve with time.  At this point there are no restrictions on his activities, but he was cautioned to build into new activities gradually.  He may begin driving, appropriate precautions were discussed.  Right shoulder pain-will order physical therapy for that.  Interestingly got better when he was off Lipitor and then got worse again when he was started back on Crestor.  However it is oddly localized to be statin related.  Recommended he hold Crestor for a few days and see if the pain goes away again.  If it does then he can talk to cardiology about the alternatives.  Will make referral to cardiac rehab  Plan: Referral to cardiac rehab and physical therapy Follow-up with cardiology I will be happy to see David Jarvis back anytime in the future if I can be of any further assistance with his care  David Higashi, MD [facility] [phone]

## 2023-12-17 NOTE — Therapy (Signed)
 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   right temple (MOHS)   BPV (benign positional vertigo)    Colon polyps    GERD (gastroesophageal reflux disease)    Glucose intolerance (impaired glucose tolerance)    History of nuclear stress test    ETT-Myoview 2/18: EF 57, no ischemia, low risk   Hyperlipidemia    Hypertension    Melanoma (HCC) 07/22/2008   RIGHT TEMPLE END STAGE LICHENORO REGRESSION MELANODERMA TX MOHS   Right knee pain    SCC (squamous cell carcinoma) 02/24/2013   left inner forearm (Cx35FU)   Vitreous floaters of both eyes    Past Surgical History:  Procedure Laterality Date   ARTHROSCOPIC REPAIR ACL Right    BILIARY BRUSHING  10/06/2020   Procedure: BILIARY BRUSHING;  Surgeon: Kerin Salen, MD;  Location: [facility];  Service: Gastroenterology;;   BILIARY STENT PLACEMENT N/A 05/23/2021   Procedure: BILIARY STENT PLACEMENT;  Surgeon: Kerin Salen, MD;  Location: [facility];  Service: Gastroenterology;  Laterality: N/A;   cardiac stress test  08/2008   COLONOSCOPY W/ POLYPECTOMY  2006   CORONARY ARTERY BYPASS GRAFT N/A 11/06/2023   Procedure: CORONARY ARTERY BYPASS GRAFTING (CABG) TIMES THREE USING LEFT INTERNAL MAMMARY ARTERY AND ENDOSCOPICALLY HARVESTED RIGHT GREATER SAPHENOUS VEIN;  Surgeon: Loreli Slot, MD;  Location: [facility] OR;  Service: Open Heart Surgery;  Laterality: N/A;   ENDOSCOPIC RETROGRADE CHOLANGIOPANCREATOGRAPHY (ERCP) WITH PROPOFOL N/A 05/23/2021   Procedure: ENDOSCOPIC RETROGRADE CHOLANGIOPANCREATOGRAPHY (ERCP) WITH PROPOFOL;   Surgeon: Kerin Salen, MD;  Location: [facility];  Service: Gastroenterology;  Laterality: N/A;   ERCP N/A 10/06/2020   Procedure: ENDOSCOPIC RETROGRADE CHOLANGIOPANCREATOGRAPHY (ERCP);  Surgeon: Kerin Salen, MD;  Location: [facility];  Service: Gastroenterology;  Laterality: N/A;   EUS N/A 05/23/2021   Procedure: UPPER ENDOSCOPIC ULTRASOUND (EUS) LINEAR;  Surgeon: Willis Modena, MD;  Location: [facility];  Service: Endoscopy;  Laterality: N/A;   FINE NEEDLE ASPIRATION N/A 05/23/2021   Procedure: FINE NEEDLE ASPIRATION (FNA) LINEAR;  Surgeon: Willis Modena, MD;  Location: [facility];  Service: Endoscopy;  Laterality: N/A;   I & D EXTREMITY Left 02/03/2020   Procedure: IRRIGATION AND DEBRIDEMENT EXTREMITY;  Surgeon: Knute Neu, MD;  Location: [facility];  Service: Plastics;  Laterality: Left;   IR IMAGING GUIDED PORT INSERTION  06/11/2021   IR REMOVAL TUN ACCESS W/ PORT W/O FL MOD SED  04/03/2022   laser surgery of the eye     LEFT HEART CATH AND CORONARY ANGIOGRAPHY N/A 11/04/2023   Procedure: LEFT HEART CATH AND CORONARY ANGIOGRAPHY;  Surgeon: Tonny Bollman, MD;  Location: [facility] IN[facility]ASIVE CV [facility];  Service: Cardiovascular;  Laterality: N/A;   SPHINCTEROTOMY  10/06/2020   Procedure: SPHINCTEROTOMY;  Surgeon: Kerin Salen, MD;  Location: [facility];  Service: Gastroenterology;;  balloon sweep   TEE WITHOUT CARDIOVERSION N/A 11/06/2023   Procedure: ECHOCARDIOGRAM, TRANSESOPHAGEAL;  Surgeon: Loreli Slot, MD;  Location: [facility] OR;  Service: Open Heart Surgery;  Laterality: N/A;  Patient Active Problem List   Diagnosis Date Noted   S/P CABG x 3 11/06/2023   Coronary artery disease involving native coronary artery of native heart with unstable angina pectoris (HCC) 11/04/2023   Coronary artery disease of native artery of native heart with stable angina pectoris (HCC) 10/30/2023   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   Genetic testing 07/02/2021   Primary cancer of head of pancreas (HCC) 05/25/2021    Epigastric abdominal pain 10/06/2020   Transaminitis 10/06/2020   Elevated lipase 10/06/2020   Essential hypertension 10/06/2020   Common bile duct dilatation 10/06/2020   Dyslipidemia (high LDL; low HDL) 10/06/2020   BPH (benign prostatic hyperplasia) 10/06/2020   Abdominal pain 10/05/2020   Fatigue 10/22/2016   Chest discomfort 10/22/2016    PCP: Georgann Housekeeper, MD  REFERRING PROVIDER: Loreli Slot, *  THERAPY DIAG:  Cervicalgia - Plan: PT plan of care cert/re-cert  REFERRING DIAG: Right shoulder pain [M25.511]   Rationale for Evaluation and Treatment:  Rehabilitation  SUBJECTIVE:  PERTINENT PAST HISTORY:  Hx of pancreatic cancer s/p Whipple, CABG x3 11/06/2023      PRECAUTIONS: Sternal  OP Date: 11/06/2023  2 weeks 11/20/2023  4 weeks 12/04/2023  6 weeks 12/18/2023  8 weeks 01/01/2024  10 weeks 01/15/2024  12 weeks 01/29/2024    WEIGHT BEARING RESTRICTIONS Per surgeon note on 4/15: "At this point there are no restrictions on his activities, but he was cautioned to build into new activities gradually. "  FALLS:  Has patient fallen in last 6 months? No, Number of falls: 0  MOI/History of condition:  Onset date: 6 weeks  SUBJECTIVE STATEMENT  David Jarvis is a 73 y.o. male who presents to clinic with chief complaint of R sided neck and shoulder pain which pt feels might be related to being put on a statin. He did not have this pain prior to his CABG about 6 weeks ago.  When he reduced his statin he felt his pain did improve a bit.  He has trialed stopping the statin for the last couple of days and feels the pain may be a bit better.  He is having minimal pain currently.   Pain:  Are you having pain? No Pain location: R UT NPRS scale:  0/10 to 4/10 Aggravating factors: R shoulder movement Relieving factors: rest Pain description: aching Stage: Subacute 24 hour pattern: NA   Occupation: NA  Assistive Device: NA   Patient Goals/Specific Activities:  reduce pain   OBJECTIVE:    GENERAL OBSERVATION: Significant rounded shoulders       PALPATION: Concordant pain R UT  UPPER EXTREMITY AROM:  ROM Right (Eval) Left (Eval)  Shoulder flexion    Shoulder abduction    Shoulder internal rotation    Shoulder external rotation    Functional IR    Functional ER    Shoulder extension    Elbow extension    Elbow flexion     (Blank rows = not tested, N = WNL, * = concordant pain with testing)  UPPER EXTREMITY MMT:  MMT Right (Eval) Left (Eval)  Shoulder flexion    Shoulder abduction (C5)    Shoulder ER    Shoulder IR    Middle trapezius    Lower trapezius    Shoulder extension    Grip strength    Cervical flexion (C1,C2)    Cervical S/B (C3)    Shoulder shrug (C4)    Elbow flexion (C6)    Elbow ext (C7)  Thumb ext (C8)    Finger abd (T1)    Grossly     (Blank rows = not tested, score listed is out of 5 possible points.  N = WNL, D = diminished, C = clear for gross weakness with myotome testing, * = concordant pain with testing)   UPPER EXTREMITY PROM:  PROM Right (Eval) Left (Eval)  Shoulder flexion    Shoulder abduction    Shoulder internal rotation    Shoulder external rotation    Functional IR    Functional ER    Shoulder extension    Elbow extension    Elbow flexion     (Blank rows = not tested, N = WNL, * = concordant pain with testing)   TODAY'S TREATMENT:  Therapeutic Exercise: Creating, reviewing, and completing below HEP   PATIENT EDUCATION ([place]/[facility]):  POC, diagnosis, prognosis, HEP, and outcome measures.  Pt educated via explanation, demonstration, and handout (HEP).  Pt confirms understanding verbally.  Discussed potential benefits of attending cardiac rehab extensively.  HOME EXERCISE PROGRAM: Access Code: [email or web address] URL: [email or web address] Date: 12/18/2023 Prepared by: Lesleigh Rash  Exercises - Upper Trapezius Stretch  - 2-3 x daily - 7 x weekly - 1 sets - 3  reps - 45 sec hold  Treatment priorities   Eval                                                  ASSESSMENT:  CLINICAL IMPRESSION: Braxtyn is a 73 y.o. male who presents to clinic with signs and sxs consistent with R shoulder pain following CABG in early March.  Most consistent with R UT muscular pain.  Provided gentle stretching.  Pt was under impression he was attending cardiac rehab today.  We discussed the difference between cardiac rehab and outpatient orthopedics.  We agreed the best plan for now is for him to complete cardiac rehab and then return if his pain persists.    OBJECTIVE IMPAIRMENTS: R UT pain  ACTIVITY LIMITATIONS: more limited by post-op precautions  PERSONAL FACTORS: See medical history and pertinent history   REHAB POTENTIAL: Good  CLINICAL DECISION MAKING: Evolving/moderate complexity  EVALUATION COMPLEXITY: Moderate     PLAN: PT FREQUENCY: 1x visit  PT DURATION: 1x visit  PLANNED INTERVENTIONS:  [id number]- PT Re-evaluation, 97110-Therapeutic exercises, 97530- Therapeutic activity, 97112- Neuromuscular re-education, 97535- Self Care, 91478- Manual therapy, [id number]- Gait training, [id number]- Aquatic Therapy, [id number]- Electrical stimulation (manual), [id number]- Vasopneumatic device, [id number]- Traction (mechanical), [id number]- Ionotophoresis 4mg /ml Dexamethasone, Taping, Dry Needling, Joint manipulation, and Spinal manipulation.   Temesgen Weightman PT, DPT 12/18/2023, 10:17 AM

## 2023-12-18 ENCOUNTER — Ambulatory Visit: Attending: Thoracic Surgery (Cardiothoracic Vascular Surgery) | Admitting: Physical Therapy

## 2023-12-18 ENCOUNTER — Other Ambulatory Visit: Payer: Self-pay

## 2023-12-18 ENCOUNTER — Encounter: Payer: Self-pay | Admitting: Physical Therapy

## 2023-12-18 DIAGNOSIS — M542 Cervicalgia: Secondary | ICD-10-CM | POA: Insufficient documentation

## 2023-12-19 ENCOUNTER — Telehealth (HOSPITAL_COMMUNITY): Payer: Self-pay

## 2023-12-19 NOTE — Telephone Encounter (Signed)
 Called patient to see if he was interested in participating in the Cardiac Rehab Program. Patient will come in for orientation on 4/24 and will attend the 12:30p exercise class.  Sent MyChart message.

## 2023-12-24 ENCOUNTER — Telehealth (HOSPITAL_COMMUNITY): Payer: Self-pay

## 2023-12-24 NOTE — Telephone Encounter (Signed)
 Called pt regarding orientation appt for 12/25/23 at 30. No answer, left message to call back.    Arlander Labrum, MS, ACSM-CEP 12/24/2023 1:27 PM

## 2023-12-24 NOTE — Telephone Encounter (Signed)
  Called pt to confirm appt for 12/25/23 at 0930. Gave pt instructions for appt, what to wear, office address, eating/taking meds before, and if sick to call and reschedule. Pt voiced understanding, all questions answered.   Health history completed? Yes   Arlander Labrum, MS, ACSM-CEP 12/24/2023 1:41 PM

## 2023-12-25 ENCOUNTER — Encounter (HOSPITAL_COMMUNITY)
Admission: RE | Admit: 2023-12-25 | Discharge: 2023-12-25 | Disposition: A | Discharge status: Home / Self Care | Source: Ambulatory Visit | Attending: Cardiovascular Disease | Admitting: Cardiovascular Disease

## 2023-12-25 VITALS — BP 122/64 | HR 69 | Ht 66.0 in | Wt 140.2 lb

## 2023-12-25 DIAGNOSIS — Z951 Presence of aortocoronary bypass graft: Secondary | ICD-10-CM | POA: Diagnosis present

## 2023-12-25 NOTE — Progress Notes (Deleted)
 Cardiac Individual Treatment Plan  Patient Details  Name: David Jarvis MRN: [id number] Date of Birth: December 20, 1950 Referring Provider:   Flowsheet Row INTENSIVE CARDIAC REHAB ORIENT from 12/25/2023 in [facility]  Referring Provider Adair Hollingshead, MD       Initial Encounter Date:  Flowsheet Row INTENSIVE CARDIAC REHAB ORIENT from 12/25/2023 in [facility]  Date 12/25/23       Visit Diagnosis: S/P CABG x 3  Patient's Home Medications on Admission:  Current Outpatient Medications:    aspirin  EC 81 MG tablet, Take 81 mg by mouth daily., Disp: , Rfl:    levothyroxine  (SYNTHROID ) 25 MCG tablet, Take 1 tablet by mouth daily before breakfast, Disp: 90 tablet, Rfl: 3   metoprolol  tartrate (LOPRESSOR ) 25 MG tablet, Take 0.5 tablets (12.5 mg total) by mouth 2 (two) times daily., Disp: 30 tablet, Rfl: 1   pantoprazole  (PROTONIX ) 40 MG tablet, TAKE ONE TABLET BY MOUTH EVERY DAY 90 days, Disp: 90 tablet, Rfl: 3   rosuvastatin  (CRESTOR ) 20 MG tablet, Take 1 tablet (20 mg total) by mouth daily., Disp: 90 tablet, Rfl: 3   tamsulosin  (FLOMAX ) 0.4 MG CAPS capsule, Take 1 capsule by mouth once daily, Disp: 90 capsule, Rfl: 4 No current facility-administered medications for this encounter.  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)   BPV (benign positional vertigo)    Colon polyps    GERD (gastroesophageal reflux disease)    Glucose intolerance (impaired glucose tolerance)    History of nuclear stress test    ETT-Myoview  2/18: EF  57, no ischemia, low risk   Hyperlipidemia    Hypertension    Melanoma (HCC) 07/22/2008   RIGHT TEMPLE END STAGE LICHENORO REGRESSION MELANODERMA TX MOHS   Right knee pain    SCC (squamous cell carcinoma) 02/24/2013   left inner forearm ([id number])   Vitreous floaters of both eyes     Tobacco Use: Social History   Tobacco Use  Smoking Status Never  Smokeless Tobacco Never    Labs: Review Flowsheet       Latest Ref Rng & Units 11/05/2023 11/06/2023  Labs for ITP Cardiac and Pulmonary Rehab  Cholestrol 0 - 200 mg/dL - 83   LDL (calc) 0 - 99 mg/dL - 43   HDL-C >19 mg/dL - 30   Trlycerides <147 mg/dL - 48   Hemoglobin W2N 4.8 - 5.6 % 5.2  -  PH, Arterial 7.35 - 7.45 7.52  7.491  7.366  7.383  7.403  7.364   PCO2 arterial 32 - 48 mmHg 34  27.3  39.4  39.8  39.8  40.6   Bicarbonate 20.0 - 28.0 mmol/L 27.1  20.9  23.0  23.7  24.8  23.2   TCO2 22 - 32 mmol/L - 22  24  25  25  26  26  27  27  26  26  25  24    Acid-base deficit 0.0 - 2.0 mmol/L - 2.0  3.0  1.0  2.0   O2 Saturation % 96.2  100  99  100  100  100     Details       Multiple values from one day are sorted in reverse-chronological order         Capillary Blood Glucose: Lab Results  Component Value Date   GLUCAP 141 (H) 11/08/2023   GLUCAP 104 (H) 11/08/2023   GLUCAP 96 11/08/2023   GLUCAP 121 (H) 11/07/2023   GLUCAP 148 (H) 11/07/2023     Exercise Target Goals: Exercise Program Goal: Individual exercise prescription set using results from initial 6 min walk test and THRR while considering  patient's activity barriers and safety.   Exercise Prescription Goal: Initial exercise prescription builds to 30-45 minutes a day of aerobic activity, 2-3 days per week.  Home exercise guidelines will be given to patient during program as part of exercise prescription that the participant will acknowledge.  Activity Barriers & Risk Stratification:  Activity Barriers & Cardiac Risk Stratification - 12/25/23 1124        Activity Barriers & Cardiac Risk Stratification   Activity Barriers Incisional Pain;Deconditioning    Cardiac Risk Stratification High   <5 METs on            6 Minute Walk:  6 Minute Walk     Row Name 12/25/23 1124         6 Minute Walk   Phase Initial     Distance 1440 feet     Walk Time 6 minutes     # of Rest Breaks 0     MPH 2.73     METS 3.44     RPE 11     Perceived Dyspnea  0     VO2 Peak 12.03     Symptoms No     Resting HR 69 bpm     Resting BP 122/64     Resting Oxygen Saturation  98 %     Exercise Oxygen Saturation  during 6 min walk 98 %     Max Ex. HR 101 bpm     Max Ex. BP 142/70     2 Minute Post BP 130/76              Oxygen Initial Assessment:   Oxygen Re-Evaluation:   Oxygen Discharge (Final Oxygen Re-Evaluation):   Initial Exercise Prescription:  Initial Exercise Prescription - 12/25/23 1100       Date of Initial Exercise RX and Referring Provider   Date 12/25/23    Referring Provider Adair Hollingshead, MD    Expected Discharge Date 03/17/24      Bike   Level 2    Watts 40    Minutes 15    METs 2.5      Recumbant Elliptical   Level 1    RPM 60    Watts 70    Minutes 15    METs 2.5      Prescription Details   Frequency (times per week) 3    Duration Progress to 30 minutes of continuous aerobic without signs/symptoms of physical distress      Intensity   THRR 40-80% of Max Heartrate 60-119    Ratings of Perceived Exertion 11-13    Perceived Dyspnea 0-4      Progression   Progression Continue progressive overload as per policy without signs/symptoms or physical distress.      Resistance Training   Training Prescription Yes    Weight 3    Reps 10-15  Perform Capillary Blood Glucose checks as needed.  Exercise Prescription Changes:   Exercise Comments:   Exercise Goals and Review:   Exercise Goals     Row Name 12/25/23 1124             Exercise Goals   Increase Physical  Activity Yes       Intervention Provide advice, education, support and counseling about physical activity/exercise needs.;Develop an individualized exercise prescription for aerobic and resistive training based on initial evaluation findings, risk stratification, comorbidities and participant's personal goals.       Expected Outcomes Short Term: Attend rehab on a regular basis to increase amount of physical activity.;Long Term: Exercising regularly at least 3-5 days a week.;Long Term: Add in home exercise to make exercise part of routine and to increase amount of physical activity.       Increase Strength and Stamina Yes       Intervention Provide advice, education, support and counseling about physical activity/exercise needs.;Develop an individualized exercise prescription for aerobic and resistive training based on initial evaluation findings, risk stratification, comorbidities and participant's personal goals.       Expected Outcomes Short Term: Increase workloads from initial exercise prescription for resistance, speed, and METs.;Short Term: Perform resistance training exercises routinely during rehab and add in resistance training at home;Long Term: Improve cardiorespiratory fitness, muscular endurance and strength as measured by increased METs and functional capacity ( )       Able to understand and use rate of perceived exertion (RPE) scale Yes       Intervention Provide education and explanation on how to use RPE scale       Expected Outcomes Short Term: Able to use RPE daily in rehab to express subjective intensity level;Long Term:  Able to use RPE to guide intensity level when exercising independently       Knowledge and understanding of Target Heart Rate Range (THRR) Yes       Intervention Provide education and explanation of THRR including how the numbers were predicted and where they are located for reference       Expected Outcomes Short Term: Able to state/look up THRR;Short Term: Able  to use daily as guideline for intensity in rehab;Long Term: Able to use THRR to govern intensity when exercising independently       Understanding of Exercise Prescription Yes       Intervention Provide education, explanation, and written materials on patient's individual exercise prescription       Expected Outcomes Long Term: Able to explain home exercise prescription to exercise independently;Short Term: Able to explain program exercise prescription                Exercise Goals Re-Evaluation :   Discharge Exercise Prescription (Final Exercise Prescription Changes):   Nutrition:  Target Goals: Understanding of nutrition guidelines, daily intake of sodium 1500mg , cholesterol 200mg , calories 30% from fat and 7% or less from saturated fats, daily to have 5 or more servings of fruits and vegetables.  Biometrics:  Pre Biometrics - 12/25/23 1005       Pre Biometrics   Waist Circumference 31.5 inches    Hip Circumference 35 inches    Waist to Hip Ratio 0.9 %    Triceps Skinfold 8 mm    % Body Fat 19.5 %    Grip Strength 30 kg    Flexibility 10.5 in    Single Leg Stand 20.75 seconds  Nutrition Therapy Plan and Nutrition Goals:   Nutrition Assessments:  MEDIFICTS Score Key: >=70 Need to make dietary changes  40-70 Heart Healthy Diet <= 40 Therapeutic Level Cholesterol Diet    Picture Your Plate Scores: <65 Unhealthy dietary pattern with much room for improvement. 41-50 Dietary pattern unlikely to meet recommendations for good health and room for improvement. 51-60 More healthful dietary pattern, with some room for improvement.  >60 Healthy dietary pattern, although there may be some specific behaviors that could be improved.    Nutrition Goals Re-Evaluation:   Nutrition Goals Re-Evaluation:   Nutrition Goals Discharge (Final Nutrition Goals Re-Evaluation):   Psychosocial: Target Goals: Acknowledge presence or absence of significant  depression and/or stress, maximize coping skills, provide positive support system. Participant is able to verbalize types and ability to use techniques and skills needed for reducing stress and depression.  Initial Review & Psychosocial Screening:  Initial Psych Review & Screening - 12/25/23 1008       Initial Review   Current issues with None Identified      Family Dynamics   Good Support System? Yes   Wife for support     Barriers   Psychosocial barriers to participate in program There are no identifiable barriers or psychosocial needs.      Screening Interventions   Interventions Encouraged to exercise;Provide feedback about the scores to participant    Expected Outcomes Short Term goal: Identification and review with participant of any Quality of Life or Depression concerns found by scoring the questionnaire.;Long Term goal: The participant improves quality of Life and PHQ9 Scores as seen by post scores and/or verbalization of changes             Quality of Life Scores:  Quality of Life - 12/25/23 1129       Quality of Life   Select Quality of Life      Quality of Life Scores   Health/Function Pre 28.8 %    Socioeconomic Pre 30 %    Psych/Spiritual Pre 30 %    Family Pre 30 %    GLOBAL Pre 29.45 %            Scores of 19 and below usually indicate a poorer quality of life in these areas.  A difference of  2-3 points is a clinically meaningful difference.  A difference of 2-3 points in the total score of the Quality of Life Index has been associated with significant improvement in overall quality of life, self-image, physical symptoms, and general health in studies assessing change in quality of life.  PHQ-9: Review Flowsheet       12/25/2023  Depression screen PHQ 2/9  Decreased Interest 0  Down, Depressed, Hopeless 0  PHQ - 2 Score 0  Altered sleeping 0  Tired, decreased energy 0  Change in appetite 0  Feeling bad or failure about yourself  0  Trouble  concentrating 0  Moving slowly or fidgety/restless 0  Suicidal thoughts 0  PHQ-9 Score 0   Interpretation of Total Score  Total Score Depression Severity:  1-4 = Minimal depression, 5-9 = Mild depression, 10-14 = Moderate depression, 15-19 = Moderately severe depression, 20-27 = Severe depression   Psychosocial Evaluation and Intervention:   Psychosocial Re-Evaluation:   Psychosocial Discharge (Final Psychosocial Re-Evaluation):   Vocational Rehabilitation: Provide vocational rehab assistance to qualifying candidates.   Vocational Rehab Evaluation & Intervention:  Vocational Rehab - 12/25/23 1009       Initial Vocational Rehab Evaluation &  Intervention   Assessment shows need for Vocational Rehabilitation No   Retired            Education: Education Goals: Education classes will be provided on a weekly basis, covering required topics. Participant will state understanding/return demonstration of topics presented.     Core Videos: Exercise    Move It!  Clinical staff conducted group or individual video education with verbal and written material and guidebook.  Patient learns the recommended Pritikin exercise program. Exercise with the goal of living a long, healthy life. Some of the health benefits of exercise include controlled diabetes, healthier blood pressure levels, improved cholesterol levels, improved heart and lung capacity, improved sleep, and better body composition. Everyone should speak with their doctor before starting or changing an exercise routine.  Biomechanical Limitations Clinical staff conducted group or individual video education with verbal and written material and guidebook.  Patient learns how biomechanical limitations can impact exercise and how we can mitigate and possibly overcome limitations to have an impactful and balanced exercise routine.  Body Composition Clinical staff conducted group or individual video education with verbal and  written material and guidebook.  Patient learns that body composition (ratio of muscle mass to fat mass) is a key component to assessing overall fitness, rather than body weight alone. Increased fat mass, especially visceral belly fat, can put us  at increased risk for metabolic syndrome, type 2 diabetes, heart disease, and even death. It is recommended to combine diet and exercise (cardiovascular and resistance training) to improve your body composition. Seek guidance from your physician and exercise physiologist before implementing an exercise routine.  Exercise Action Plan Clinical staff conducted group or individual video education with verbal and written material and guidebook.  Patient learns the recommended strategies to achieve and enjoy long-term exercise adherence, including variety, self-motivation, self-efficacy, and positive decision making. Benefits of exercise include fitness, good health, weight management, more energy, better sleep, less stress, and overall well-being.  Medical   Heart Disease Risk Reduction Clinical staff conducted group or individual video education with verbal and written material and guidebook.  Patient learns our heart is our most vital organ as it circulates oxygen, nutrients, white blood cells, and hormones throughout the entire body, and carries waste away. Data supports a plant-based eating plan like the Pritikin Program for its effectiveness in slowing progression of and reversing heart disease. The video provides a number of recommendations to address heart disease.   Metabolic Syndrome and Belly Fat  Clinical staff conducted group or individual video education with verbal and written material and guidebook.  Patient learns what metabolic syndrome is, how it leads to heart disease, and how one can reverse it and keep it from coming back. You have metabolic syndrome if you have 3 of the following 5 criteria: abdominal obesity, high blood pressure, high  triglycerides, low HDL cholesterol, and high blood sugar.  Hypertension and Heart Disease Clinical staff conducted group or individual video education with verbal and written material and guidebook.  Patient learns that high blood pressure, or hypertension, is very common in the [place] . Hypertension is largely due to excessive salt intake, but other important risk factors include being overweight, physical inactivity, drinking too much alcohol, smoking, and not eating enough potassium from fruits and vegetables. High blood pressure is a leading risk factor for heart attack, stroke, congestive heart failure, dementia, kidney failure, and premature death. Long-term effects of excessive salt intake include stiffening of the arteries and thickening of heart muscle and  organ damage. Recommendations include ways to reduce hypertension and the risk of heart disease.  Diseases of Our Time - Focusing on Diabetes Clinical staff conducted group or individual video education with verbal and written material and guidebook.  Patient learns why the best way to stop diseases of our time is prevention, through food and other lifestyle changes. Medicine (such as prescription pills and surgeries) is often only a Band-Aid on the problem, not a long-term solution. Most common diseases of our time include obesity, type 2 diabetes, hypertension, heart disease, and cancer. The Pritikin Program is recommended and has been proven to help reduce, reverse, and/or prevent the damaging effects of metabolic syndrome.  Nutrition   Overview of the Pritikin Eating Plan  Clinical staff conducted group or individual video education with verbal and written material and guidebook.  Patient learns about the Pritikin Eating Plan for disease risk reduction. The Pritikin Eating Plan emphasizes a wide variety of unrefined, minimally-processed carbohydrates, like fruits, vegetables, whole grains, and legumes. Go, Caution, and Stop food  choices are explained. Plant-based and lean animal proteins are emphasized. Rationale provided for low sodium intake for blood pressure control, low added sugars for blood sugar stabilization, and low added fats and oils for coronary artery disease risk reduction and weight management.  Calorie Density  Clinical staff conducted group or individual video education with verbal and written material and guidebook.  Patient learns about calorie density and how it impacts the Pritikin Eating Plan. Knowing the characteristics of the food you choose will help you decide whether those foods will lead to weight gain or weight loss, and whether you want to consume more or less of them. Weight loss is usually a side effect of the Pritikin Eating Plan because of its focus on low calorie-dense foods.  Label Reading  Clinical staff conducted group or individual video education with verbal and written material and guidebook.  Patient learns about the Pritikin recommended label reading guidelines and corresponding recommendations regarding calorie density, added sugars, sodium content, and whole grains.  Dining Out - Part 1  Clinical staff conducted group or individual video education with verbal and written material and guidebook.  Patient learns that restaurant meals can be sabotaging because they can be so high in calories, fat, sodium, and/or sugar. Patient learns recommended strategies on how to positively address this and avoid unhealthy pitfalls.  Facts on Fats  Clinical staff conducted group or individual video education with verbal and written material and guidebook.  Patient learns that lifestyle modifications can be just as effective, if not more so, as many medications for lowering your risk of heart disease. A Pritikin lifestyle can help to reduce your risk of inflammation and atherosclerosis (cholesterol build-up, or plaque, in the artery walls). Lifestyle interventions such as dietary choices and  physical activity address the cause of atherosclerosis. A review of the types of fats and their impact on blood cholesterol levels, along with dietary recommendations to reduce fat intake is also included.  Nutrition Action Plan  Clinical staff conducted group or individual video education with verbal and written material and guidebook.  Patient learns how to incorporate Pritikin recommendations into their lifestyle. Recommendations include planning and keeping personal health goals in mind as an important part of their success.  Healthy Mind-Set    Healthy Minds, Bodies, Hearts  Clinical staff conducted group or individual video education with verbal and written material and guidebook.  Patient learns how to identify when they are stressed. Video will discuss the  impact of that stress, as well as the many benefits of stress management. Patient will also be introduced to stress management techniques. The way we think, act, and feel has an impact on our hearts.  How Our Thoughts Can Heal Our Hearts  Clinical staff conducted group or individual video education with verbal and written material and guidebook.  Patient learns that negative thoughts can cause depression and anxiety. This can result in negative lifestyle behavior and serious health problems. Cognitive behavioral therapy is an effective method to help control our thoughts in order to change and improve our emotional outlook.  Additional Videos:  Exercise    Improving Performance  Clinical staff conducted group or individual video education with verbal and written material and guidebook.  Patient learns to use a non-linear approach by alternating intensity levels and lengths of time spent exercising to help burn more calories and lose more body fat. Cardiovascular exercise helps improve heart health, metabolism, hormonal balance, blood sugar control, and recovery from fatigue. Resistance training improves strength, endurance, balance,  coordination, reaction time, metabolism, and muscle mass. Flexibility exercise improves circulation, posture, and balance. Seek guidance from your physician and exercise physiologist before implementing an exercise routine and learn your capabilities and proper form for all exercise.  Introduction to Yoga  Clinical staff conducted group or individual video education with verbal and written material and guidebook.  Patient learns about yoga, a discipline of the coming together of mind, breath, and body. The benefits of yoga include improved flexibility, improved range of motion, better posture and core strength, increased lung function, weight loss, and positive self-image. Yoga's heart health benefits include lowered blood pressure, healthier heart rate, decreased cholesterol and triglyceride levels, improved immune function, and reduced stress. Seek guidance from your physician and exercise physiologist before implementing an exercise routine and learn your capabilities and proper form for all exercise.  Medical   Aging: Enhancing Your Quality of Life  Clinical staff conducted group or individual video education with verbal and written material and guidebook.  Patient learns key strategies and recommendations to stay in good physical health and enhance quality of life, such as prevention strategies, having an advocate, securing a Health Care Proxy and Power of Attorney, and keeping a list of medications and system for tracking them. It also discusses how to avoid risk for bone loss.  Biology of Weight Control  Clinical staff conducted group or individual video education with verbal and written material and guidebook.  Patient learns that weight gain occurs because we consume more calories than we burn (eating more, moving less). Even if your body weight is normal, you may have higher ratios of fat compared to muscle mass. Too much body fat puts you at increased risk for cardiovascular disease, heart  attack, stroke, type 2 diabetes, and obesity-related cancers. In addition to exercise, following the Pritikin Eating Plan can help reduce your risk.  Decoding Lab Results  Clinical staff conducted group or individual video education with verbal and written material and guidebook.  Patient learns that lab test reflects one measurement whose values change over time and are influenced by many factors, including medication, stress, sleep, exercise, food, hydration, pre-existing medical conditions, and more. It is recommended to use the knowledge from this video to become more involved with your lab results and evaluate your numbers to speak with your doctor.   Diseases of Our Time - Overview  Clinical staff conducted group or individual video education with verbal and written material and guidebook.  Patient learns that according to the CDC, 50% to 70% of chronic diseases (such as obesity, type 2 diabetes, elevated lipids, hypertension, and heart disease) are avoidable through lifestyle improvements including healthier food choices, listening to satiety cues, and increased physical activity.  Sleep Disorders Clinical staff conducted group or individual video education with verbal and written material and guidebook.  Patient learns how good quality and duration of sleep are important to overall health and well-being. Patient also learns about sleep disorders and how they impact health along with recommendations to address them, including discussing with a physician.  Nutrition  Dining Out - Part 2 Clinical staff conducted group or individual video education with verbal and written material and guidebook.  Patient learns how to plan ahead and communicate in order to maximize their dining experience in a healthy and nutritious manner. Included are recommended food choices based on the type of restaurant the patient is visiting.   Fueling a Banker conducted group or individual  video education with verbal and written material and guidebook.  There is a strong connection between our food choices and our health. Diseases like obesity and type 2 diabetes are very prevalent and are in large-part due to lifestyle choices. The Pritikin Eating Plan provides plenty of food and hunger-curbing satisfaction. It is easy to follow, affordable, and helps reduce health risks.  Menu Workshop  Clinical staff conducted group or individual video education with verbal and written material and guidebook.  Patient learns that restaurant meals can sabotage health goals because they are often packed with calories, fat, sodium, and sugar. Recommendations include strategies to plan ahead and to communicate with the manager, chef, or server to help order a healthier meal.  Planning Your Eating Strategy  Clinical staff conducted group or individual video education with verbal and written material and guidebook.  Patient learns about the Pritikin Eating Plan and its benefit of reducing the risk of disease. The Pritikin Eating Plan does not focus on calories. Instead, it emphasizes high-quality, nutrient-rich foods. By knowing the characteristics of the foods, we choose, we can determine their calorie density and make informed decisions.  Targeting Your Nutrition Priorities  Clinical staff conducted group or individual video education with verbal and written material and guidebook.  Patient learns that lifestyle habits have a tremendous impact on disease risk and progression. This video provides eating and physical activity recommendations based on your personal health goals, such as reducing LDL cholesterol, losing weight, preventing or controlling type 2 diabetes, and reducing high blood pressure.  Vitamins and Minerals  Clinical staff conducted group or individual video education with verbal and written material and guidebook.  Patient learns different ways to obtain key vitamins and minerals,  including through a recommended healthy diet. It is important to discuss all supplements you take with your doctor.   Healthy Mind-Set    Smoking Cessation  Clinical staff conducted group or individual video education with verbal and written material and guidebook.  Patient learns that cigarette smoking and tobacco addiction pose a serious health risk which affects millions of people. Stopping smoking will significantly reduce the risk of heart disease, lung disease, and many forms of cancer. Recommended strategies for quitting are covered, including working with your doctor to develop a successful plan.  Culinary   Becoming a Set designer conducted group or individual video education with verbal and written material and guidebook.  Patient learns that cooking at home can be healthy, cost-effective, quick, and  puts them in control. Keys to cooking healthy recipes will include looking at your recipe, assessing your equipment needs, planning ahead, making it simple, choosing cost-effective seasonal ingredients, and limiting the use of added fats, salts, and sugars.  Cooking - Breakfast and Snacks  Clinical staff conducted group or individual video education with verbal and written material and guidebook.  Patient learns how important breakfast is to satiety and nutrition through the entire day. Recommendations include key foods to eat during breakfast to help stabilize blood sugar levels and to prevent overeating at meals later in the day. Planning ahead is also a key component.  Cooking - Educational psychologist conducted group or individual video education with verbal and written material and guidebook.  Patient learns eating strategies to improve overall health, including an approach to cook more at home. Recommendations include thinking of animal protein as a side on your plate rather than center stage and focusing instead on lower calorie dense options like vegetables,  fruits, whole grains, and plant-based proteins, such as beans. Making sauces in large quantities to freeze for later and leaving the skin on your vegetables are also recommended to maximize your experience.  Cooking - Healthy Salads and Dressing Clinical staff conducted group or individual video education with verbal and written material and guidebook.  Patient learns that vegetables, fruits, whole grains, and legumes are the foundations of the Pritikin Eating Plan. Recommendations include how to incorporate each of these in flavorful and healthy salads, and how to create homemade salad dressings. Proper handling of ingredients is also covered. Cooking - Soups and [employer] - Soups and Desserts Clinical staff conducted group or individual video education with verbal and written material and guidebook.  Patient learns that Pritikin soups and desserts make for easy, nutritious, and delicious snacks and meal components that are low in sodium, fat, sugar, and calorie density, while high in vitamins, minerals, and filling fiber. Recommendations include simple and healthy ideas for soups and desserts.   Overview     The Pritikin Solution Program Overview Clinical staff conducted group or individual video education with verbal and written material and guidebook.  Patient learns that the results of the Pritikin Program have been documented in more than 100 articles published in peer-reviewed journals, and the benefits include reducing risk factors for (and, in some cases, even reversing) high cholesterol, high blood pressure, type 2 diabetes, obesity, and more! An overview of the three key pillars of the Pritikin Program will be covered: eating well, doing regular exercise, and having a healthy mind-set.  WORKSHOPS  Exercise: Exercise Basics: Building Your Action Plan Clinical staff led group instruction and group discussion with PowerPoint presentation and patient guidebook. To enhance the  learning environment the use of posters, models and videos may be added. At the conclusion of this workshop, patients will comprehend the difference between physical activity and exercise, as well as the benefits of incorporating both, into their routine. Patients will understand the FITT (Frequency, Intensity, Time, and Type) principle and how to use it to build an exercise action plan. In addition, safety concerns and other considerations for exercise and cardiac rehab will be addressed by the presenter. The purpose of this lesson is to promote a comprehensive and effective weekly exercise routine in order to improve patients' overall level of fitness.   Managing Heart Disease: Your Path to a Healthier Heart Clinical staff led group instruction and group discussion with PowerPoint presentation and patient guidebook. To enhance the  learning environment the use of posters, models and videos may be added.At the conclusion of this workshop, patients will understand the anatomy and physiology of the heart. Additionally, they will understand how Pritikin's three pillars impact the risk factors, the progression, and the management of heart disease.  The purpose of this lesson is to provide a high-level overview of the heart, heart disease, and how the Pritikin lifestyle positively impacts risk factors.  Exercise Biomechanics Clinical staff led group instruction and group discussion with PowerPoint presentation and patient guidebook. To enhance the learning environment the use of posters, models and videos may be added. Patients will learn how the structural parts of their bodies function and how these functions impact their daily activities, movement, and exercise. Patients will learn how to promote a neutral spine, learn how to manage pain, and identify ways to improve their physical movement in order to promote healthy living. The purpose of this lesson is to expose patients to common  physical limitations that impact physical activity. Participants will learn practical ways to adapt and manage aches and pains, and to minimize their effect on regular exercise. Patients will learn how to maintain good posture while sitting, walking, and lifting.  Balance Training and Fall Prevention  Clinical staff led group instruction and group discussion with PowerPoint presentation and patient guidebook. To enhance the learning environment the use of posters, models and videos may be added. At the conclusion of this workshop, patients will understand the importance of their sensorimotor skills (vision, proprioception, and the vestibular system) in maintaining their ability to balance as they age. Patients will apply a variety of balancing exercises that are appropriate for their current level of function. Patients will understand the common causes for poor balance, possible solutions to these problems, and ways to modify their physical environment in order to minimize their fall risk. The purpose of this lesson is to teach patients about the importance of maintaining balance as they age and ways to minimize their risk of falling.  WORKSHOPS   Nutrition:  Fueling a Ship broker led group instruction and group discussion with PowerPoint presentation and patient guidebook. To enhance the learning environment the use of posters, models and videos may be added. Patients will review the foundational principles of the Pritikin Eating Plan and understand what constitutes a serving size in each of the food groups. Patients will also learn Pritikin-friendly foods that are better choices when away from home and review make-ahead meal and snack options. Calorie density will be reviewed and applied to three nutrition priorities: weight maintenance, weight loss, and weight gain. The purpose of this lesson is to reinforce (in a group setting) the key concepts around what patients are  recommended to eat and how to apply these guidelines when away from home by planning and selecting Pritikin-friendly options. Patients will understand how calorie density may be adjusted for different weight management goals.  Mindful Eating  Clinical staff led group instruction and group discussion with PowerPoint presentation and patient guidebook. To enhance the learning environment the use of posters, models and videos may be added. Patients will briefly review the concepts of the Pritikin Eating Plan and the importance of low-calorie dense foods. The concept of mindful eating will be introduced as well as the importance of paying attention to internal hunger signals. Triggers for non-hunger eating and techniques for dealing with triggers will be explored. The purpose of this lesson is to provide patients with the opportunity to review the basic principles of  the Pritikin Eating Plan, discuss the value of eating mindfully and how to measure internal cues of hunger and fullness using the Hunger Scale. Patients will also discuss reasons for non-hunger eating and learn strategies to use for controlling emotional eating.  Targeting Your Nutrition Priorities Clinical staff led group instruction and group discussion with PowerPoint presentation and patient guidebook. To enhance the learning environment the use of posters, models and videos may be added. Patients will learn how to determine their genetic susceptibility to disease by reviewing their family history. Patients will gain insight into the importance of diet as part of an overall healthy lifestyle in mitigating the impact of genetics and other environmental insults. The purpose of this lesson is to provide patients with the opportunity to assess their personal nutrition priorities by looking at their family history, their own health history and current risk factors. Patients will also be able to discuss ways of prioritizing and modifying the Pritikin  Eating Plan for their highest risk areas  Menu  Clinical staff led group instruction and group discussion with PowerPoint presentation and patient guidebook. To enhance the learning environment the use of posters, models and videos may be added. Using menus brought in from [employer], or printed from [employer], patients will apply the Pritikin dining out guidelines that were presented in the [employer] video. Patients will also be able to practice these guidelines in a variety of provided scenarios. The purpose of this lesson is to provide patients with the opportunity to practice hands-on learning of the Pritikin Dining Out guidelines with actual menus and practice scenarios.  Label Reading Clinical staff led group instruction and group discussion with PowerPoint presentation and patient guidebook. To enhance the learning environment the use of posters, models and videos may be added. Patients will review and discuss the Pritikin label reading guidelines presented in Pritikin's Label Reading Educational series video. Using fool labels brought in from local grocery stores and markets, patients will apply the label reading guidelines and determine if the packaged food meet the Pritikin guidelines. The purpose of this lesson is to provide patients with the opportunity to review, discuss, and practice hands-on learning of the Pritikin Label Reading guidelines with actual packaged food labels. Cooking School  Pritikin's [employer] are designed to teach patients ways to prepare quick, simple, and affordable recipes at home. The importance of nutrition's role in chronic disease risk reduction is reflected in its emphasis in the overall Pritikin program. By learning how to prepare essential core Pritikin Eating Plan recipes, patients will increase control over what they eat; be able to customize the flavor of foods without the use of added salt, sugar, or fat; and  improve the quality of the food they consume. By learning a set of core recipes which are easily assembled, quickly prepared, and affordable, patients are more likely to prepare more healthy foods at home. These workshops focus on convenient breakfasts, simple entres, side dishes, and desserts which can be prepared with minimal effort and are consistent with nutrition recommendations for cardiovascular risk reduction. Cooking [employer] are taught by a Armed forces logistics/support/administrative officer (RD) who has been trained by the [employer]. The chef or RD has a clear understanding of the importance of minimizing - if not completely eliminating - added fat, sugar, and sodium in recipes. Throughout the series of Cooking School Workshop sessions, patients will learn about healthy ingredients and efficient methods of cooking to build confidence in their capability  to prepare    Cooking School weekly topics:  Adding Flavor- Sodium-Free  Fast and Healthy Breakfasts  Powerhouse Plant-Based Proteins  Satisfying Salads and Dressings  Simple Sides and Sauces  International Cuisine-Spotlight on the Blue Zones  Delicious Desserts  Savory Soups  Efficiency Cooking - Meals in a Snap  Tasty Appetizers and Snacks  Comforting Weekend Breakfasts  One-Pot Wonders   Fast Evening Meals  Landscape architect Your Pritikin Plate  WORKSHOPS   Healthy Mindset (Psychosocial):  Focused Goals, Sustainable Changes Clinical staff led group instruction and group discussion with PowerPoint presentation and patient guidebook. To enhance the learning environment the use of posters, models and videos may be added. Patients will be able to apply effective goal setting strategies to establish at least one personal goal, and then take consistent, meaningful action toward that goal. They will learn to identify common barriers to achieving personal goals and develop strategies to overcome them. Patients will  also gain an understanding of how our mind-set can impact our ability to achieve goals and the importance of cultivating a positive and growth-oriented mind-set. The purpose of this lesson is to provide patients with a deeper understanding of how to set and achieve personal goals, as well as the tools and strategies needed to overcome common obstacles which may arise along the way.  From Head to Heart: The Power of a Healthy Outlook  Clinical staff led group instruction and group discussion with PowerPoint presentation and patient guidebook. To enhance the learning environment the use of posters, models and videos may be added. Patients will be able to recognize and describe the impact of emotions and mood on physical health. They will discover the importance of self-care and explore self-care practices which may work for them. Patients will also learn how to utilize the 4 C's to cultivate a healthier outlook and better manage stress and challenges. The purpose of this lesson is to demonstrate to patients how a healthy outlook is an essential part of maintaining good health, especially as they continue their cardiac rehab journey.  Healthy Sleep for a Healthy Heart Clinical staff led group instruction and group discussion with PowerPoint presentation and patient guidebook. To enhance the learning environment the use of posters, models and videos may be added. At the conclusion of this workshop, patients will be able to demonstrate knowledge of the importance of sleep to overall health, well-being, and quality of life. They will understand the symptoms of, and treatments for, common sleep disorders. Patients will also be able to identify daytime and nighttime behaviors which impact sleep, and they will be able to apply these tools to help manage sleep-related challenges. The purpose of this lesson is to provide patients with a general overview of sleep and outline the importance of quality sleep. Patients will  learn about a few of the most common sleep disorders. Patients will also be introduced to the concept of "sleep hygiene," and discover ways to self-manage certain sleeping problems through simple daily behavior changes. Finally, the workshop will motivate patients by clarifying the links between quality sleep and their goals of heart-healthy living.   Recognizing and Reducing Stress Clinical staff led group instruction and group discussion with PowerPoint presentation and patient guidebook. To enhance the learning environment the use of posters, models and videos may be added. At the conclusion of this workshop, patients will be able to understand the types of stress reactions, differentiate between acute and chronic stress, and recognize the impact that chronic stress has  on their health. They will also be able to apply different coping mechanisms, such as reframing negative self-talk. Patients will have the opportunity to practice a variety of stress management techniques, such as deep abdominal breathing, progressive muscle relaxation, and/or guided imagery.  The purpose of this lesson is to educate patients on the role of stress in their lives and to provide healthy techniques for coping with it.  Learning Barriers/Preferences:  Learning Barriers/Preferences - 12/25/23 1008       Learning Barriers/Preferences   Learning Barriers Sight   wears glasses   Learning Preferences Audio;Computer/Internet;Group Instruction;Individual Instruction;Pictoral;Skilled Demonstration;Verbal Instruction;Video             Education Topics:  Knowledge Questionnaire Score:  Knowledge Questionnaire Score - 12/25/23 1119       Knowledge Questionnaire Score   Pre Score 21/24             Core Components/Risk Factors/Patient Goals at Admission:  Personal Goals and Risk Factors at Admission - 12/25/23 1010       Core Components/Risk Factors/Patient Goals on Admission    Weight Management Yes;Weight  Maintenance    Intervention Weight Management: Develop a combined nutrition and exercise program designed to reach desired caloric intake, while maintaining appropriate intake of nutrient and fiber, sodium and fats, and appropriate energy expenditure required for the weight goal.;Weight Management: Provide education and appropriate resources to help participant work on and attain dietary goals.    Expected Outcomes Short Term: Continue to assess and modify interventions until short term weight is achieved;Long Term: Adherence to nutrition and physical activity/exercise program aimed toward attainment of established weight goal;Weight Maintenance: Understanding of the daily nutrition guidelines, which includes 25-35% calories from fat, 7% or less cal from saturated fats, less than 200mg  cholesterol, less than 1.5gm of sodium, & 5 or more servings of fruits and vegetables daily;Understanding recommendations for meals to include 15-35% energy as protein, 25-35% energy from fat, 35-60% energy from carbohydrates, less than 200mg  of dietary cholesterol, 20-35 gm of total fiber daily;Understanding of distribution of calorie intake throughout the day with the consumption of 4-5 meals/snacks    Hypertension Yes    Intervention Provide education on lifestyle modifcations including regular physical activity/exercise, weight management, moderate sodium restriction and increased consumption of fresh fruit, vegetables, and low fat dairy, alcohol moderation, and smoking cessation.;Monitor prescription use compliance.    Expected Outcomes Short Term: Continued assessment and intervention until BP is < 140/26mm HG in hypertensive participants. < 130/74mm HG in hypertensive participants with diabetes, heart failure or chronic kidney disease.;Long Term: Maintenance of blood pressure at goal levels.    Lipids Yes    Intervention Provide education and support for participant on nutrition & aerobic/resistive exercise along with  prescribed medications to achieve LDL 70mg , HDL >40mg .    Expected Outcomes Short Term: Participant states understanding of desired cholesterol values and is compliant with medications prescribed. Participant is following exercise prescription and nutrition guidelines.;Long Term: Cholesterol controlled with medications as prescribed, with individualized exercise RX and with personalized nutrition plan. Value goals: LDL < 70mg , HDL > 40 mg.             Core Components/Risk Factors/Patient Goals Review:    Core Components/Risk Factors/Patient Goals at Discharge (Final Review):    ITP Comments:  ITP Comments     Row Name 12/25/23 1120           ITP Comments Dr. Gaylyn Keas medical director. Introduction to pritikin education/intensive cardiac rehab. Initial orientation packet  reviewed with patient.                Comments: Participant attended orientation for the cardiac rehabilitation program on  12/25/2023  to perform initial intake and exercise walk test. Patient introduced to the Pritikin Program education and orientation packet was reviewed. Completed 6-minute walk test, measurements, initial ITP, and exercise prescription. Vital signs stable. Telemetry-normal sinus rhythm, asymptomatic.   Service time was from 0909 to 1050.  Arlander Labrum, MS, ACSM-CEP 12/25/2023 11:32 AM

## 2023-12-25 NOTE — Progress Notes (Signed)
 Cardiac Individual Treatment Plan  Patient Details  Name: David Jarvis MRN: [id number] Date of Birth: [birth year] Referring Provider:   Flowsheet Row INTENSIVE CARDIAC REHAB ORIENT from 12/25/2023 in [facility]  Referring Provider Adair Hollingshead, MD       Initial Encounter Date:  Flowsheet Row INTENSIVE CARDIAC REHAB ORIENT from 12/25/2023 in [facility]  Date 12/25/23       Visit Diagnosis: S/P CABG x 3  Patient's Home Medications on Admission:  Current Outpatient Medications:    aspirin  EC 81 MG tablet, Take 81 mg by mouth daily., Disp: , Rfl:    levothyroxine  (SYNTHROID ) 25 MCG tablet, Take 1 tablet by mouth daily before breakfast, Disp: 90 tablet, Rfl: 3   metoprolol  tartrate (LOPRESSOR ) 25 MG tablet, Take 0.5 tablets (12.5 mg total) by mouth 2 (two) times daily., Disp: 30 tablet, Rfl: 1   pantoprazole  (PROTONIX ) 40 MG tablet, TAKE ONE TABLET BY MOUTH EVERY DAY 90 days, Disp: 90 tablet, Rfl: 3   rosuvastatin  (CRESTOR ) 20 MG tablet, Take 1 tablet (20 mg total) by mouth daily., Disp: 90 tablet, Rfl: 3   tamsulosin  (FLOMAX ) 0.4 MG CAPS capsule, Take 1 capsule by mouth once daily, Disp: 90 capsule, Rfl: 4 No current facility-administered medications for this encounter.  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)   BPV (benign positional vertigo)    Colon polyps    GERD (gastroesophageal reflux disease)    Glucose intolerance (impaired glucose tolerance)    History of nuclear stress test    ETT-Myoview  2/18: EF 57,  no ischemia, low risk   Hyperlipidemia    Hypertension    Melanoma (HCC) 07/22/2008   RIGHT TEMPLE END STAGE LICHENORO REGRESSION MELANODERMA TX MOHS   Right knee pain    SCC (squamous cell carcinoma) 02/24/2013   left inner forearm ([id number])   Vitreous floaters of both eyes     Tobacco Use: Social History   Tobacco Use  Smoking Status Never  Smokeless Tobacco Never    Labs: Review Flowsheet       Latest Ref Rng & Units 11/05/2023 11/06/2023  Labs for ITP Cardiac and Pulmonary Rehab  Cholestrol 0 - 200 mg/dL - 83   LDL (calc) 0 - 99 mg/dL - 43   HDL-C >82 mg/dL - 30   Trlycerides <956 mg/dL - 48   Hemoglobin O1H 4.8 - 5.6 % 5.2  -  PH, Arterial 7.35 - 7.45 7.52  7.491  7.366  7.383  7.403  7.364   PCO2 arterial 32 - 48 mmHg 34  27.3  39.4  39.8  39.8  40.6   Bicarbonate 20.0 - 28.0 mmol/L 27.1  20.9  23.0  23.7  24.8  23.2   TCO2 22 - 32 mmol/L - 22  24  25  25  26  26  27  27  26  26  25  24    Acid-base deficit 0.0 - 2.0 mmol/L - 2.0  3.0  1.0  2.0   O2 Saturation % 96.2  100  99  100  100  100     Details       Multiple values from one day are sorted in reverse-chronological order         Capillary Blood Glucose: Lab Results  Component Value Date   GLUCAP 141 (H) 11/08/2023   GLUCAP 104 (H) 11/08/2023   GLUCAP 96 11/08/2023   GLUCAP 121 (H) 11/07/2023   GLUCAP 148 (H) 11/07/2023     Exercise Target Goals: Exercise Program Goal: Individual exercise prescription set using results from initial 6 min walk test and THRR while considering  patient's activity barriers and safety.   Exercise Prescription Goal: Initial exercise prescription builds to 30-45 minutes a day of aerobic activity, 2-3 days per week.  Home exercise guidelines will be given to patient during program as part of exercise prescription that the participant will acknowledge.  Activity Barriers & Risk Stratification:  Activity Barriers & Cardiac Risk Stratification - 12/25/23 1124        Activity Barriers & Cardiac Risk Stratification   Activity Barriers Incisional Pain;Deconditioning    Cardiac Risk Stratification High   <5 METs on            6 Minute Walk:  6 Minute Walk     Row Name 12/25/23 1124         6 Minute Walk   Phase Initial     Distance 1440 feet     Walk Time 6 minutes     # of Rest Breaks 0     MPH 2.73     METS 3.44     RPE 11     Perceived Dyspnea  0     VO2 Peak 12.03     Symptoms No     Resting HR 69 bpm     Resting BP 122/64     Resting Oxygen Saturation  98 %     Exercise Oxygen Saturation  during 6 min walk 98 %     Max Ex. HR 101 bpm     Max Ex. BP 142/70     2 Minute Post BP 130/76              Oxygen Initial Assessment:   Oxygen Re-Evaluation:   Oxygen Discharge (Final Oxygen Re-Evaluation):   Initial Exercise Prescription:  Initial Exercise Prescription - 12/25/23 1100       Date of Initial Exercise RX and Referring Provider   Date 12/25/23    Referring Provider Adair Hollingshead, MD    Expected Discharge Date 03/17/24      Bike   Level 2    Watts 40    Minutes 15    METs 2.5      Recumbant Elliptical   Level 1    RPM 60    Watts 70    Minutes 15    METs 2.5      Prescription Details   Frequency (times per week) 3    Duration Progress to 30 minutes of continuous aerobic without signs/symptoms of physical distress      Intensity   THRR 40-80% of Max Heartrate 60-119    Ratings of Perceived Exertion 11-13    Perceived Dyspnea 0-4      Progression   Progression Continue progressive overload as per policy without signs/symptoms or physical distress.      Resistance Training   Training Prescription Yes    Weight 3    Reps 10-15  Perform Capillary Blood Glucose checks as needed.  Exercise Prescription Changes:   Exercise Comments:   Exercise Goals and Review:   Exercise Goals     Row Name 12/25/23 1124             Exercise Goals   Increase Physical  Activity Yes       Intervention Provide advice, education, support and counseling about physical activity/exercise needs.;Develop an individualized exercise prescription for aerobic and resistive training based on initial evaluation findings, risk stratification, comorbidities and participant's personal goals.       Expected Outcomes Short Term: Attend rehab on a regular basis to increase amount of physical activity.;Long Term: Exercising regularly at least 3-5 days a week.;Long Term: Add in home exercise to make exercise part of routine and to increase amount of physical activity.       Increase Strength and Stamina Yes       Intervention Provide advice, education, support and counseling about physical activity/exercise needs.;Develop an individualized exercise prescription for aerobic and resistive training based on initial evaluation findings, risk stratification, comorbidities and participant's personal goals.       Expected Outcomes Short Term: Increase workloads from initial exercise prescription for resistance, speed, and METs.;Short Term: Perform resistance training exercises routinely during rehab and add in resistance training at home;Long Term: Improve cardiorespiratory fitness, muscular endurance and strength as measured by increased METs and functional capacity ( )       Able to understand and use rate of perceived exertion (RPE) scale Yes       Intervention Provide education and explanation on how to use RPE scale       Expected Outcomes Short Term: Able to use RPE daily in rehab to express subjective intensity level;Long Term:  Able to use RPE to guide intensity level when exercising independently       Knowledge and understanding of Target Heart Rate Range (THRR) Yes       Intervention Provide education and explanation of THRR including how the numbers were predicted and where they are located for reference       Expected Outcomes Short Term: Able to state/look up THRR;Short Term: Able  to use daily as guideline for intensity in rehab;Long Term: Able to use THRR to govern intensity when exercising independently       Understanding of Exercise Prescription Yes       Intervention Provide education, explanation, and written materials on patient's individual exercise prescription       Expected Outcomes Long Term: Able to explain home exercise prescription to exercise independently;Short Term: Able to explain program exercise prescription                Exercise Goals Re-Evaluation :   Discharge Exercise Prescription (Final Exercise Prescription Changes):   Nutrition:  Target Goals: Understanding of nutrition guidelines, daily intake of sodium 1500mg , cholesterol 200mg , calories 30% from fat and 7% or less from saturated fats, daily to have 5 or more servings of fruits and vegetables.  Biometrics:  Pre Biometrics - 12/25/23 1005       Pre Biometrics   Waist Circumference 31.5 inches    Hip Circumference 35 inches    Waist to Hip Ratio 0.9 %    Triceps Skinfold 8 mm    % Body Fat 19.5 %    Grip Strength 30 kg    Flexibility 10.5 in    Single Leg Stand 20.75 seconds  Nutrition Therapy Plan and Nutrition Goals:   Nutrition Assessments:  MEDIFICTS Score Key: >=70 Need to make dietary changes  40-70 Heart Healthy Diet <= 40 Therapeutic Level Cholesterol Diet    Picture Your Plate Scores: <44 Unhealthy dietary pattern with much room for improvement. 41-50 Dietary pattern unlikely to meet recommendations for good health and room for improvement. 51-60 More healthful dietary pattern, with some room for improvement.  >60 Healthy dietary pattern, although there may be some specific behaviors that could be improved.    Nutrition Goals Re-Evaluation:   Nutrition Goals Re-Evaluation:   Nutrition Goals Discharge (Final Nutrition Goals Re-Evaluation):   Psychosocial: Target Goals: Acknowledge presence or absence of significant  depression and/or stress, maximize coping skills, provide positive support system. Participant is able to verbalize types and ability to use techniques and skills needed for reducing stress and depression.  Initial Review & Psychosocial Screening:  Initial Psych Review & Screening - 12/25/23 1008       Initial Review   Current issues with None Identified      Family Dynamics   Good Support System? Yes   Wife for support     Barriers   Psychosocial barriers to participate in program There are no identifiable barriers or psychosocial needs.      Screening Interventions   Interventions Encouraged to exercise;Provide feedback about the scores to participant    Expected Outcomes Short Term goal: Identification and review with participant of any Quality of Life or Depression concerns found by scoring the questionnaire.;Long Term goal: The participant improves quality of Life and PHQ9 Scores as seen by post scores and/or verbalization of changes             Quality of Life Scores:  Quality of Life - 12/25/23 1129       Quality of Life   Select Quality of Life      Quality of Life Scores   Health/Function Pre 28.8 %    Socioeconomic Pre 30 %    Psych/Spiritual Pre 30 %    Family Pre 30 %    GLOBAL Pre 29.45 %            Scores of 19 and below usually indicate a poorer quality of life in these areas.  A difference of  2-3 points is a clinically meaningful difference.  A difference of 2-3 points in the total score of the Quality of Life Index has been associated with significant improvement in overall quality of life, self-image, physical symptoms, and general health in studies assessing change in quality of life.  PHQ-9: Review Flowsheet       12/25/2023  Depression screen PHQ 2/9  Decreased Interest 0  Down, Depressed, Hopeless 0  PHQ - 2 Score 0  Altered sleeping 0  Tired, decreased energy 0  Change in appetite 0  Feeling bad or failure about yourself  0  Trouble  concentrating 0  Moving slowly or fidgety/restless 0  Suicidal thoughts 0  PHQ-9 Score 0   Interpretation of Total Score  Total Score Depression Severity:  1-4 = Minimal depression, 5-9 = Mild depression, 10-14 = Moderate depression, 15-19 = Moderately severe depression, 20-27 = Severe depression   Psychosocial Evaluation and Intervention:   Psychosocial Re-Evaluation:   Psychosocial Discharge (Final Psychosocial Re-Evaluation):   Vocational Rehabilitation: Provide vocational rehab assistance to qualifying candidates.   Vocational Rehab Evaluation & Intervention:  Vocational Rehab - 12/25/23 1009       Initial Vocational Rehab Evaluation &  Intervention   Assessment shows need for Vocational Rehabilitation No   Retired            Education: Education Goals: Education classes will be provided on a weekly basis, covering required topics. Participant will state understanding/return demonstration of topics presented.     Core Videos: Exercise    Move It!  Clinical staff conducted group or individual video education with verbal and written material and guidebook.  Patient learns the recommended Pritikin exercise program. Exercise with the goal of living a long, healthy life. Some of the health benefits of exercise include controlled diabetes, healthier blood pressure levels, improved cholesterol levels, improved heart and lung capacity, improved sleep, and better body composition. Everyone should speak with their doctor before starting or changing an exercise routine.  Biomechanical Limitations Clinical staff conducted group or individual video education with verbal and written material and guidebook.  Patient learns how biomechanical limitations can impact exercise and how we can mitigate and possibly overcome limitations to have an impactful and balanced exercise routine.  Body Composition Clinical staff conducted group or individual video education with verbal and  written material and guidebook.  Patient learns that body composition (ratio of muscle mass to fat mass) is a key component to assessing overall fitness, rather than body weight alone. Increased fat mass, especially visceral belly fat, can put us  at increased risk for metabolic syndrome, type 2 diabetes, heart disease, and even death. It is recommended to combine diet and exercise (cardiovascular and resistance training) to improve your body composition. Seek guidance from your physician and exercise physiologist before implementing an exercise routine.  Exercise Action Plan Clinical staff conducted group or individual video education with verbal and written material and guidebook.  Patient learns the recommended strategies to achieve and enjoy long-term exercise adherence, including variety, self-motivation, self-efficacy, and positive decision making. Benefits of exercise include fitness, good health, weight management, more energy, better sleep, less stress, and overall well-being.  Medical   Heart Disease Risk Reduction Clinical staff conducted group or individual video education with verbal and written material and guidebook.  Patient learns our heart is our most vital organ as it circulates oxygen, nutrients, white blood cells, and hormones throughout the entire body, and carries waste away. Data supports a plant-based eating plan like the Pritikin Program for its effectiveness in slowing progression of and reversing heart disease. The video provides a number of recommendations to address heart disease.   Metabolic Syndrome and Belly Fat  Clinical staff conducted group or individual video education with verbal and written material and guidebook.  Patient learns what metabolic syndrome is, how it leads to heart disease, and how one can reverse it and keep it from coming back. You have metabolic syndrome if you have 3 of the following 5 criteria: abdominal obesity, high blood pressure, high  triglycerides, low HDL cholesterol, and high blood sugar.  Hypertension and Heart Disease Clinical staff conducted group or individual video education with verbal and written material and guidebook.  Patient learns that high blood pressure, or hypertension, is very common in the [place] . Hypertension is largely due to excessive salt intake, but other important risk factors include being overweight, physical inactivity, drinking too much alcohol, smoking, and not eating enough potassium from fruits and vegetables. High blood pressure is a leading risk factor for heart attack, stroke, congestive heart failure, dementia, kidney failure, and premature death. Long-term effects of excessive salt intake include stiffening of the arteries and thickening of heart muscle and  organ damage. Recommendations include ways to reduce hypertension and the risk of heart disease.  Diseases of Our Time - Focusing on Diabetes Clinical staff conducted group or individual video education with verbal and written material and guidebook.  Patient learns why the best way to stop diseases of our time is prevention, through food and other lifestyle changes. Medicine (such as prescription pills and surgeries) is often only a Band-Aid on the problem, not a long-term solution. Most common diseases of our time include obesity, type 2 diabetes, hypertension, heart disease, and cancer. The Pritikin Program is recommended and has been proven to help reduce, reverse, and/or prevent the damaging effects of metabolic syndrome.  Nutrition   Overview of the Pritikin Eating Plan  Clinical staff conducted group or individual video education with verbal and written material and guidebook.  Patient learns about the Pritikin Eating Plan for disease risk reduction. The Pritikin Eating Plan emphasizes a wide variety of unrefined, minimally-processed carbohydrates, like fruits, vegetables, whole grains, and legumes. Go, Caution, and Stop food  choices are explained. Plant-based and lean animal proteins are emphasized. Rationale provided for low sodium intake for blood pressure control, low added sugars for blood sugar stabilization, and low added fats and oils for coronary artery disease risk reduction and weight management.  Calorie Density  Clinical staff conducted group or individual video education with verbal and written material and guidebook.  Patient learns about calorie density and how it impacts the Pritikin Eating Plan. Knowing the characteristics of the food you choose will help you decide whether those foods will lead to weight gain or weight loss, and whether you want to consume more or less of them. Weight loss is usually a side effect of the Pritikin Eating Plan because of its focus on low calorie-dense foods.  Label Reading  Clinical staff conducted group or individual video education with verbal and written material and guidebook.  Patient learns about the Pritikin recommended label reading guidelines and corresponding recommendations regarding calorie density, added sugars, sodium content, and whole grains.  Dining Out - Part 1  Clinical staff conducted group or individual video education with verbal and written material and guidebook.  Patient learns that restaurant meals can be sabotaging because they can be so high in calories, fat, sodium, and/or sugar. Patient learns recommended strategies on how to positively address this and avoid unhealthy pitfalls.  Facts on Fats  Clinical staff conducted group or individual video education with verbal and written material and guidebook.  Patient learns that lifestyle modifications can be just as effective, if not more so, as many medications for lowering your risk of heart disease. A Pritikin lifestyle can help to reduce your risk of inflammation and atherosclerosis (cholesterol build-up, or plaque, in the artery walls). Lifestyle interventions such as dietary choices and  physical activity address the cause of atherosclerosis. A review of the types of fats and their impact on blood cholesterol levels, along with dietary recommendations to reduce fat intake is also included.  Nutrition Action Plan  Clinical staff conducted group or individual video education with verbal and written material and guidebook.  Patient learns how to incorporate Pritikin recommendations into their lifestyle. Recommendations include planning and keeping personal health goals in mind as an important part of their success.  Healthy Mind-Set    Healthy Minds, Bodies, Hearts  Clinical staff conducted group or individual video education with verbal and written material and guidebook.  Patient learns how to identify when they are stressed. Video will discuss the  impact of that stress, as well as the many benefits of stress management. Patient will also be introduced to stress management techniques. The way we think, act, and feel has an impact on our hearts.  How Our Thoughts Can Heal Our Hearts  Clinical staff conducted group or individual video education with verbal and written material and guidebook.  Patient learns that negative thoughts can cause depression and anxiety. This can result in negative lifestyle behavior and serious health problems. Cognitive behavioral therapy is an effective method to help control our thoughts in order to change and improve our emotional outlook.  Additional Videos:  Exercise    Improving Performance  Clinical staff conducted group or individual video education with verbal and written material and guidebook.  Patient learns to use a non-linear approach by alternating intensity levels and lengths of time spent exercising to help burn more calories and lose more body fat. Cardiovascular exercise helps improve heart health, metabolism, hormonal balance, blood sugar control, and recovery from fatigue. Resistance training improves strength, endurance, balance,  coordination, reaction time, metabolism, and muscle mass. Flexibility exercise improves circulation, posture, and balance. Seek guidance from your physician and exercise physiologist before implementing an exercise routine and learn your capabilities and proper form for all exercise.  Introduction to Yoga  Clinical staff conducted group or individual video education with verbal and written material and guidebook.  Patient learns about yoga, a discipline of the coming together of mind, breath, and body. The benefits of yoga include improved flexibility, improved range of motion, better posture and core strength, increased lung function, weight loss, and positive self-image. Yoga's heart health benefits include lowered blood pressure, healthier heart rate, decreased cholesterol and triglyceride levels, improved immune function, and reduced stress. Seek guidance from your physician and exercise physiologist before implementing an exercise routine and learn your capabilities and proper form for all exercise.  Medical   Aging: Enhancing Your Quality of Life  Clinical staff conducted group or individual video education with verbal and written material and guidebook.  Patient learns key strategies and recommendations to stay in good physical health and enhance quality of life, such as prevention strategies, having an advocate, securing a Health Care Proxy and Power of Attorney, and keeping a list of medications and system for tracking them. It also discusses how to avoid risk for bone loss.  Biology of Weight Control  Clinical staff conducted group or individual video education with verbal and written material and guidebook.  Patient learns that weight gain occurs because we consume more calories than we burn (eating more, moving less). Even if your body weight is normal, you may have higher ratios of fat compared to muscle mass. Too much body fat puts you at increased risk for cardiovascular disease, heart  attack, stroke, type 2 diabetes, and obesity-related cancers. In addition to exercise, following the Pritikin Eating Plan can help reduce your risk.  Decoding Lab Results  Clinical staff conducted group or individual video education with verbal and written material and guidebook.  Patient learns that lab test reflects one measurement whose values change over time and are influenced by many factors, including medication, stress, sleep, exercise, food, hydration, pre-existing medical conditions, and more. It is recommended to use the knowledge from this video to become more involved with your lab results and evaluate your numbers to speak with your doctor.   Diseases of Our Time - Overview  Clinical staff conducted group or individual video education with verbal and written material and guidebook.  Patient learns that according to the CDC, 50% to 70% of chronic diseases (such as obesity, type 2 diabetes, elevated lipids, hypertension, and heart disease) are avoidable through lifestyle improvements including healthier food choices, listening to satiety cues, and increased physical activity.  Sleep Disorders Clinical staff conducted group or individual video education with verbal and written material and guidebook.  Patient learns how good quality and duration of sleep are important to overall health and well-being. Patient also learns about sleep disorders and how they impact health along with recommendations to address them, including discussing with a physician.  Nutrition  Dining Out - Part 2 Clinical staff conducted group or individual video education with verbal and written material and guidebook.  Patient learns how to plan ahead and communicate in order to maximize their dining experience in a healthy and nutritious manner. Included are recommended food choices based on the type of restaurant the patient is visiting.   Fueling a Banker conducted group or individual  video education with verbal and written material and guidebook.  There is a strong connection between our food choices and our health. Diseases like obesity and type 2 diabetes are very prevalent and are in large-part due to lifestyle choices. The Pritikin Eating Plan provides plenty of food and hunger-curbing satisfaction. It is easy to follow, affordable, and helps reduce health risks.  Menu Workshop  Clinical staff conducted group or individual video education with verbal and written material and guidebook.  Patient learns that restaurant meals can sabotage health goals because they are often packed with calories, fat, sodium, and sugar. Recommendations include strategies to plan ahead and to communicate with the manager, chef, or server to help order a healthier meal.  Planning Your Eating Strategy  Clinical staff conducted group or individual video education with verbal and written material and guidebook.  Patient learns about the Pritikin Eating Plan and its benefit of reducing the risk of disease. The Pritikin Eating Plan does not focus on calories. Instead, it emphasizes high-quality, nutrient-rich foods. By knowing the characteristics of the foods, we choose, we can determine their calorie density and make informed decisions.  Targeting Your Nutrition Priorities  Clinical staff conducted group or individual video education with verbal and written material and guidebook.  Patient learns that lifestyle habits have a tremendous impact on disease risk and progression. This video provides eating and physical activity recommendations based on your personal health goals, such as reducing LDL cholesterol, losing weight, preventing or controlling type 2 diabetes, and reducing high blood pressure.  Vitamins and Minerals  Clinical staff conducted group or individual video education with verbal and written material and guidebook.  Patient learns different ways to obtain key vitamins and minerals,  including through a recommended healthy diet. It is important to discuss all supplements you take with your doctor.   Healthy Mind-Set    Smoking Cessation  Clinical staff conducted group or individual video education with verbal and written material and guidebook.  Patient learns that cigarette smoking and tobacco addiction pose a serious health risk which affects millions of people. Stopping smoking will significantly reduce the risk of heart disease, lung disease, and many forms of cancer. Recommended strategies for quitting are covered, including working with your doctor to develop a successful plan.  Culinary   Becoming a Set designer conducted group or individual video education with verbal and written material and guidebook.  Patient learns that cooking at home can be healthy, cost-effective, quick, and  puts them in control. Keys to cooking healthy recipes will include looking at your recipe, assessing your equipment needs, planning ahead, making it simple, choosing cost-effective seasonal ingredients, and limiting the use of added fats, salts, and sugars.  Cooking - Breakfast and Snacks  Clinical staff conducted group or individual video education with verbal and written material and guidebook.  Patient learns how important breakfast is to satiety and nutrition through the entire day. Recommendations include key foods to eat during breakfast to help stabilize blood sugar levels and to prevent overeating at meals later in the day. Planning ahead is also a key component.  Cooking - Educational psychologist conducted group or individual video education with verbal and written material and guidebook.  Patient learns eating strategies to improve overall health, including an approach to cook more at home. Recommendations include thinking of animal protein as a side on your plate rather than center stage and focusing instead on lower calorie dense options like vegetables,  fruits, whole grains, and plant-based proteins, such as beans. Making sauces in large quantities to freeze for later and leaving the skin on your vegetables are also recommended to maximize your experience.  Cooking - Healthy Salads and Dressing Clinical staff conducted group or individual video education with verbal and written material and guidebook.  Patient learns that vegetables, fruits, whole grains, and legumes are the foundations of the Pritikin Eating Plan. Recommendations include how to incorporate each of these in flavorful and healthy salads, and how to create homemade salad dressings. Proper handling of ingredients is also covered. Cooking - Soups and [employer] - Soups and Desserts Clinical staff conducted group or individual video education with verbal and written material and guidebook.  Patient learns that Pritikin soups and desserts make for easy, nutritious, and delicious snacks and meal components that are low in sodium, fat, sugar, and calorie density, while high in vitamins, minerals, and filling fiber. Recommendations include simple and healthy ideas for soups and desserts.   Overview     The Pritikin Solution Program Overview Clinical staff conducted group or individual video education with verbal and written material and guidebook.  Patient learns that the results of the Pritikin Program have been documented in more than 100 articles published in peer-reviewed journals, and the benefits include reducing risk factors for (and, in some cases, even reversing) high cholesterol, high blood pressure, type 2 diabetes, obesity, and more! An overview of the three key pillars of the Pritikin Program will be covered: eating well, doing regular exercise, and having a healthy mind-set.  WORKSHOPS  Exercise: Exercise Basics: Building Your Action Plan Clinical staff led group instruction and group discussion with PowerPoint presentation and patient guidebook. To enhance the  learning environment the use of posters, models and videos may be added. At the conclusion of this workshop, patients will comprehend the difference between physical activity and exercise, as well as the benefits of incorporating both, into their routine. Patients will understand the FITT (Frequency, Intensity, Time, and Type) principle and how to use it to build an exercise action plan. In addition, safety concerns and other considerations for exercise and cardiac rehab will be addressed by the presenter. The purpose of this lesson is to promote a comprehensive and effective weekly exercise routine in order to improve patients' overall level of fitness.   Managing Heart Disease: Your Path to a Healthier Heart Clinical staff led group instruction and group discussion with PowerPoint presentation and patient guidebook. To enhance the  learning environment the use of posters, models and videos may be added.At the conclusion of this workshop, patients will understand the anatomy and physiology of the heart. Additionally, they will understand how Pritikin's three pillars impact the risk factors, the progression, and the management of heart disease.  The purpose of this lesson is to provide a high-level overview of the heart, heart disease, and how the Pritikin lifestyle positively impacts risk factors.  Exercise Biomechanics Clinical staff led group instruction and group discussion with PowerPoint presentation and patient guidebook. To enhance the learning environment the use of posters, models and videos may be added. Patients will learn how the structural parts of their bodies function and how these functions impact their daily activities, movement, and exercise. Patients will learn how to promote a neutral spine, learn how to manage pain, and identify ways to improve their physical movement in order to promote healthy living. The purpose of this lesson is to expose patients to common  physical limitations that impact physical activity. Participants will learn practical ways to adapt and manage aches and pains, and to minimize their effect on regular exercise. Patients will learn how to maintain good posture while sitting, walking, and lifting.  Balance Training and Fall Prevention  Clinical staff led group instruction and group discussion with PowerPoint presentation and patient guidebook. To enhance the learning environment the use of posters, models and videos may be added. At the conclusion of this workshop, patients will understand the importance of their sensorimotor skills (vision, proprioception, and the vestibular system) in maintaining their ability to balance as they age. Patients will apply a variety of balancing exercises that are appropriate for their current level of function. Patients will understand the common causes for poor balance, possible solutions to these problems, and ways to modify their physical environment in order to minimize their fall risk. The purpose of this lesson is to teach patients about the importance of maintaining balance as they age and ways to minimize their risk of falling.  WORKSHOPS   Nutrition:  Fueling a Ship broker led group instruction and group discussion with PowerPoint presentation and patient guidebook. To enhance the learning environment the use of posters, models and videos may be added. Patients will review the foundational principles of the Pritikin Eating Plan and understand what constitutes a serving size in each of the food groups. Patients will also learn Pritikin-friendly foods that are better choices when away from home and review make-ahead meal and snack options. Calorie density will be reviewed and applied to three nutrition priorities: weight maintenance, weight loss, and weight gain. The purpose of this lesson is to reinforce (in a group setting) the key concepts around what patients are  recommended to eat and how to apply these guidelines when away from home by planning and selecting Pritikin-friendly options. Patients will understand how calorie density may be adjusted for different weight management goals.  Mindful Eating  Clinical staff led group instruction and group discussion with PowerPoint presentation and patient guidebook. To enhance the learning environment the use of posters, models and videos may be added. Patients will briefly review the concepts of the Pritikin Eating Plan and the importance of low-calorie dense foods. The concept of mindful eating will be introduced as well as the importance of paying attention to internal hunger signals. Triggers for non-hunger eating and techniques for dealing with triggers will be explored. The purpose of this lesson is to provide patients with the opportunity to review the basic principles of  the Pritikin Eating Plan, discuss the value of eating mindfully and how to measure internal cues of hunger and fullness using the Hunger Scale. Patients will also discuss reasons for non-hunger eating and learn strategies to use for controlling emotional eating.  Targeting Your Nutrition Priorities Clinical staff led group instruction and group discussion with PowerPoint presentation and patient guidebook. To enhance the learning environment the use of posters, models and videos may be added. Patients will learn how to determine their genetic susceptibility to disease by reviewing their family history. Patients will gain insight into the importance of diet as part of an overall healthy lifestyle in mitigating the impact of genetics and other environmental insults. The purpose of this lesson is to provide patients with the opportunity to assess their personal nutrition priorities by looking at their family history, their own health history and current risk factors. Patients will also be able to discuss ways of prioritizing and modifying the Pritikin  Eating Plan for their highest risk areas  Menu  Clinical staff led group instruction and group discussion with PowerPoint presentation and patient guidebook. To enhance the learning environment the use of posters, models and videos may be added. Using menus brought in from [employer], or printed from [employer], patients will apply the Pritikin dining out guidelines that were presented in the [employer] video. Patients will also be able to practice these guidelines in a variety of provided scenarios. The purpose of this lesson is to provide patients with the opportunity to practice hands-on learning of the Pritikin Dining Out guidelines with actual menus and practice scenarios.  Label Reading Clinical staff led group instruction and group discussion with PowerPoint presentation and patient guidebook. To enhance the learning environment the use of posters, models and videos may be added. Patients will review and discuss the Pritikin label reading guidelines presented in Pritikin's Label Reading Educational series video. Using fool labels brought in from local grocery stores and markets, patients will apply the label reading guidelines and determine if the packaged food meet the Pritikin guidelines. The purpose of this lesson is to provide patients with the opportunity to review, discuss, and practice hands-on learning of the Pritikin Label Reading guidelines with actual packaged food labels. Cooking School  Pritikin's [employer] are designed to teach patients ways to prepare quick, simple, and affordable recipes at home. The importance of nutrition's role in chronic disease risk reduction is reflected in its emphasis in the overall Pritikin program. By learning how to prepare essential core Pritikin Eating Plan recipes, patients will increase control over what they eat; be able to customize the flavor of foods without the use of added salt, sugar, or fat; and  improve the quality of the food they consume. By learning a set of core recipes which are easily assembled, quickly prepared, and affordable, patients are more likely to prepare more healthy foods at home. These workshops focus on convenient breakfasts, simple entres, side dishes, and desserts which can be prepared with minimal effort and are consistent with nutrition recommendations for cardiovascular risk reduction. Cooking [employer] are taught by a Armed forces logistics/support/administrative officer (RD) who has been trained by the [employer]. The chef or RD has a clear understanding of the importance of minimizing - if not completely eliminating - added fat, sugar, and sodium in recipes. Throughout the series of Cooking School Workshop sessions, patients will learn about healthy ingredients and efficient methods of cooking to build confidence in their capability  to prepare    Cooking School weekly topics:  Adding Flavor- Sodium-Free  Fast and Healthy Breakfasts  Powerhouse Plant-Based Proteins  Satisfying Salads and Dressings  Simple Sides and Sauces  International Cuisine-Spotlight on the Blue Zones  Delicious Desserts  Savory Soups  Efficiency Cooking - Meals in a Snap  Tasty Appetizers and Snacks  Comforting Weekend Breakfasts  One-Pot Wonders   Fast Evening Meals  Landscape architect Your Pritikin Plate  WORKSHOPS   Healthy Mindset (Psychosocial):  Focused Goals, Sustainable Changes Clinical staff led group instruction and group discussion with PowerPoint presentation and patient guidebook. To enhance the learning environment the use of posters, models and videos may be added. Patients will be able to apply effective goal setting strategies to establish at least one personal goal, and then take consistent, meaningful action toward that goal. They will learn to identify common barriers to achieving personal goals and develop strategies to overcome them. Patients will  also gain an understanding of how our mind-set can impact our ability to achieve goals and the importance of cultivating a positive and growth-oriented mind-set. The purpose of this lesson is to provide patients with a deeper understanding of how to set and achieve personal goals, as well as the tools and strategies needed to overcome common obstacles which may arise along the way.  From Head to Heart: The Power of a Healthy Outlook  Clinical staff led group instruction and group discussion with PowerPoint presentation and patient guidebook. To enhance the learning environment the use of posters, models and videos may be added. Patients will be able to recognize and describe the impact of emotions and mood on physical health. They will discover the importance of self-care and explore self-care practices which may work for them. Patients will also learn how to utilize the 4 C's to cultivate a healthier outlook and better manage stress and challenges. The purpose of this lesson is to demonstrate to patients how a healthy outlook is an essential part of maintaining good health, especially as they continue their cardiac rehab journey.  Healthy Sleep for a Healthy Heart Clinical staff led group instruction and group discussion with PowerPoint presentation and patient guidebook. To enhance the learning environment the use of posters, models and videos may be added. At the conclusion of this workshop, patients will be able to demonstrate knowledge of the importance of sleep to overall health, well-being, and quality of life. They will understand the symptoms of, and treatments for, common sleep disorders. Patients will also be able to identify daytime and nighttime behaviors which impact sleep, and they will be able to apply these tools to help manage sleep-related challenges. The purpose of this lesson is to provide patients with a general overview of sleep and outline the importance of quality sleep. Patients will  learn about a few of the most common sleep disorders. Patients will also be introduced to the concept of "sleep hygiene," and discover ways to self-manage certain sleeping problems through simple daily behavior changes. Finally, the workshop will motivate patients by clarifying the links between quality sleep and their goals of heart-healthy living.   Recognizing and Reducing Stress Clinical staff led group instruction and group discussion with PowerPoint presentation and patient guidebook. To enhance the learning environment the use of posters, models and videos may be added. At the conclusion of this workshop, patients will be able to understand the types of stress reactions, differentiate between acute and chronic stress, and recognize the impact that chronic stress has  on their health. They will also be able to apply different coping mechanisms, such as reframing negative self-talk. Patients will have the opportunity to practice a variety of stress management techniques, such as deep abdominal breathing, progressive muscle relaxation, and/or guided imagery.  The purpose of this lesson is to educate patients on the role of stress in their lives and to provide healthy techniques for coping with it.  Learning Barriers/Preferences:  Learning Barriers/Preferences - 12/25/23 1008       Learning Barriers/Preferences   Learning Barriers Sight   wears glasses   Learning Preferences Audio;Computer/Internet;Group Instruction;Individual Instruction;Pictoral;Skilled Demonstration;Verbal Instruction;Video             Education Topics:  Knowledge Questionnaire Score:  Knowledge Questionnaire Score - 12/25/23 1119       Knowledge Questionnaire Score   Pre Score 21/24             Core Components/Risk Factors/Patient Goals at Admission:  Personal Goals and Risk Factors at Admission - 12/25/23 1010       Core Components/Risk Factors/Patient Goals on Admission    Weight Management Yes;Weight  Maintenance    Intervention Weight Management: Develop a combined nutrition and exercise program designed to reach desired caloric intake, while maintaining appropriate intake of nutrient and fiber, sodium and fats, and appropriate energy expenditure required for the weight goal.;Weight Management: Provide education and appropriate resources to help participant work on and attain dietary goals.    Expected Outcomes Short Term: Continue to assess and modify interventions until short term weight is achieved;Long Term: Adherence to nutrition and physical activity/exercise program aimed toward attainment of established weight goal;Weight Maintenance: Understanding of the daily nutrition guidelines, which includes 25-35% calories from fat, 7% or less cal from saturated fats, less than 200mg  cholesterol, less than 1.5gm of sodium, & 5 or more servings of fruits and vegetables daily;Understanding recommendations for meals to include 15-35% energy as protein, 25-35% energy from fat, 35-60% energy from carbohydrates, less than 200mg  of dietary cholesterol, 20-35 gm of total fiber daily;Understanding of distribution of calorie intake throughout the day with the consumption of 4-5 meals/snacks    Hypertension Yes    Intervention Provide education on lifestyle modifcations including regular physical activity/exercise, weight management, moderate sodium restriction and increased consumption of fresh fruit, vegetables, and low fat dairy, alcohol moderation, and smoking cessation.;Monitor prescription use compliance.    Expected Outcomes Short Term: Continued assessment and intervention until BP is < 140/80mm HG in hypertensive participants. < 130/91mm HG in hypertensive participants with diabetes, heart failure or chronic kidney disease.;Long Term: Maintenance of blood pressure at goal levels.    Lipids Yes    Intervention Provide education and support for participant on nutrition & aerobic/resistive exercise along with  prescribed medications to achieve LDL 70mg , HDL >40mg .    Expected Outcomes Short Term: Participant states understanding of desired cholesterol values and is compliant with medications prescribed. Participant is following exercise prescription and nutrition guidelines.;Long Term: Cholesterol controlled with medications as prescribed, with individualized exercise RX and with personalized nutrition plan. Value goals: LDL < 70mg , HDL > 40 mg.             Core Components/Risk Factors/Patient Goals Review:    Core Components/Risk Factors/Patient Goals at Discharge (Final Review):    ITP Comments:  ITP Comments     Row Name 12/25/23 1120           ITP Comments Dr. Gaylyn Keas medical director. Introduction to pritikin education/intensive cardiac rehab. Initial orientation packet  reviewed with patient.                Comments: Participant attended orientation for the cardiac rehabilitation program on  12/25/2023  to perform initial intake and exercise walk test. Patient introduced to the Pritikin Program education and orientation packet was reviewed. Completed 6-minute walk test, measurements, initial ITP, and exercise prescription. Vital signs stable. Telemetry-normal sinus rhythm, asymptomatic.   Service time was from 0909 to 1050.  Arlander Labrum, MS, ACSM-CEP 12/25/2023 4:42 PM

## 2023-12-25 NOTE — Progress Notes (Signed)
 Cardiac Rehab Medication Review   Does the patient  feel that his/her medications are working for him/her? Yes    Has the patient been experiencing any side effects to the medications prescribed? No David Jarvis believes his statin may be causing some pain in his shoulder, only localized to R shoulder. Has discussed with MD who believes it may be surgical related. David Jarvis says it does not bother him too bad, only with certain movements.   Does the patient measure his/her own blood pressure or blood glucose at home?   Yes  Does the patient have any problems obtaining medications due to transportation or finances?   No  Understanding of regimen: excellent Understanding of indications: excellent Potential of compliance: excellent    Comments: David Jarvis understands his medications and regime well. He checks his BP occasionally.    Arlander Labrum, MS, ACSM-CEP 12/25/2023 10:02 AM

## 2023-12-29 ENCOUNTER — Encounter (HOSPITAL_COMMUNITY)
Admission: RE | Admit: 2023-12-29 | Discharge: 2023-12-29 | Disposition: A | Discharge status: Home / Self Care | Source: Ambulatory Visit | Attending: Cardiovascular Disease | Admitting: Cardiovascular Disease

## 2023-12-29 DIAGNOSIS — Z951 Presence of aortocoronary bypass graft: Secondary | ICD-10-CM | POA: Diagnosis not present

## 2023-12-29 NOTE — Progress Notes (Signed)
 Daily Session Note  Patient Details  Name: David Jarvis MRN: [id number] Date of Birth: Feb 08, 1951 Referring Provider:   Flowsheet Row INTENSIVE CARDIAC REHAB ORIENT from 12/25/2023 in [facility]  Referring Provider Adair Hollingshead, MD       Encounter Date: 12/29/2023  Check In:  Session Check In - 12/29/23 1304       Check-In   Supervising physician immediately available to respond to emergencies [facility] MD immediately available    Physician(s) Lawana Pray, NP    Location [facility]    Staff Present Rosezena Contes, MS, ACSM-CEP, CCRP, Exercise Physiologist;Olinty Gaylene Kays, MS, ACSM-CEP, Exercise Physiologist;Jetta Otho Blitz BS, ACSM-CEP, Exercise Physiologist;Johnny Alexia Angelucci, MS, Exercise Physiologist;Adaysha Dubinsky, RN, BSN    Virtual Visit No    Medication changes reported     No    Fall or balance concerns reported    No    Tobacco Cessation No Change    Warm-up and Cool-down Performed as group-led instruction    Resistance Training Performed Yes    VAD Patient? No    PAD/SET Patient? No      Pain Assessment   Currently in Pain? No/denies    Pain Score 0-No pain    Multiple Pain Sites No             Capillary Blood Glucose: No results found for this or any previous visit (from the past 24 hours).   Exercise Prescription Changes - 12/29/23 1600       Response to Exercise   Blood Pressure (Admit) 104/64    Blood Pressure (Exercise) 128/64    Blood Pressure (Exit) 112/72    Heart Rate (Admit) 59 bpm    Heart Rate (Exercise) 92 bpm    Heart Rate (Exit) 68 bpm    Rating of Perceived Exertion (Exercise) 8.5    Symptoms None    Comments Pt's first day in the CRP2 program    Duration Continue with 30 min of aerobic exercise without signs/symptoms of physical distress.    Intensity THRR unchanged      Progression   Progression Continue to progress workloads to maintain intensity without  signs/symptoms of physical distress.    Average METs 4      Resistance Training   Training Prescription Yes    Weight 3 lbs    Reps 10-15    Time 5 Minutes      Interval Training   Interval Training No      Bike   Level 3    Watts 62    Minutes 15    METs 3.6      Recumbant Elliptical   Level 4    RPM 43    Watts 71    Minutes 15    METs 4.5             Social History   Tobacco Use  Smoking Status Never  Smokeless Tobacco Never    Goals Met:  Exercise tolerated well No report of concerns or symptoms today Strength training completed today  Goals Unmet:  Not Applicable  Comments: Pt started cardiac rehab today.  Pt tolerated light exercise without difficulty. VSS, telemetry-Sinus Rhythm, asymptomatic.  Medication list reconciled. Pt denies barriers to medicaiton compliance.  PSYCHOSOCIAL ASSESSMENT:  PHQ-0. Pt exhibits positive coping skills, hopeful outlook with supportive family. No psychosocial needs identified at this time, no psychosocial interventions necessary.    Pt enjoys mountain biking, yard work,  home projects,spending time with children and the beach.   Pt oriented to exercise equipment and routine.    Understanding verbalized.Monte Antonio RN BSN    Dr. Gaylyn Keas is Medical Director for Cardiac Rehab at [facility].

## 2023-12-31 ENCOUNTER — Encounter (HOSPITAL_COMMUNITY)
Admission: RE | Admit: 2023-12-31 | Discharge: 2023-12-31 | Discharge status: Home / Self Care | Source: Ambulatory Visit | Attending: Cardiovascular Disease

## 2023-12-31 DIAGNOSIS — Z951 Presence of aortocoronary bypass graft: Secondary | ICD-10-CM | POA: Diagnosis not present

## 2024-01-05 ENCOUNTER — Encounter (HOSPITAL_COMMUNITY)
Admission: RE | Admit: 2024-01-05 | Discharge: 2024-01-05 | Disposition: A | Discharge status: Home / Self Care | Source: Ambulatory Visit | Attending: Cardiovascular Disease | Admitting: Cardiovascular Disease

## 2024-01-05 DIAGNOSIS — Z951 Presence of aortocoronary bypass graft: Secondary | ICD-10-CM | POA: Insufficient documentation

## 2024-01-06 ENCOUNTER — Other Ambulatory Visit: Payer: Self-pay | Admitting: Surgical

## 2024-01-07 ENCOUNTER — Encounter (HOSPITAL_COMMUNITY)
Admission: RE | Admit: 2024-01-07 | Discharge: 2024-01-07 | Disposition: A | Discharge status: Home / Self Care | Source: Ambulatory Visit | Attending: Cardiovascular Disease

## 2024-01-07 DIAGNOSIS — Z951 Presence of aortocoronary bypass graft: Secondary | ICD-10-CM | POA: Diagnosis present

## 2024-01-07 DIAGNOSIS — E782 Mixed hyperlipidemia: Secondary | ICD-10-CM | POA: Diagnosis not present

## 2024-01-07 DIAGNOSIS — C61 Malignant neoplasm of prostate: Secondary | ICD-10-CM | POA: Diagnosis not present

## 2024-01-07 DIAGNOSIS — I10 Essential (primary) hypertension: Secondary | ICD-10-CM | POA: Diagnosis not present

## 2024-01-07 DIAGNOSIS — E039 Hypothyroidism, unspecified: Secondary | ICD-10-CM | POA: Diagnosis not present

## 2024-01-12 ENCOUNTER — Encounter (HOSPITAL_COMMUNITY): Admission: RE | Admit: 2024-01-12 | Source: Ambulatory Visit

## 2024-01-12 ENCOUNTER — Ambulatory Visit (HOSPITAL_BASED_OUTPATIENT_CLINIC_OR_DEPARTMENT_OTHER)

## 2024-01-12 ENCOUNTER — Inpatient Hospital Stay: Payer: Medicare HMO | Admitting: Oncology

## 2024-01-12 ENCOUNTER — Inpatient Hospital Stay: Payer: Medicare HMO | Attending: Oncology

## 2024-01-12 ENCOUNTER — Other Ambulatory Visit: Payer: Self-pay | Admitting: *Deleted

## 2024-01-12 VITALS — BP 127/83 | HR 100 | Temp 98.1°F | Resp 18 | Ht 66.0 in | Wt 141.5 lb

## 2024-01-12 DIAGNOSIS — C251 Malignant neoplasm of body of pancreas: Secondary | ICD-10-CM

## 2024-01-12 DIAGNOSIS — I2510 Atherosclerotic heart disease of native coronary artery without angina pectoris: Secondary | ICD-10-CM | POA: Diagnosis not present

## 2024-01-12 DIAGNOSIS — Z8507 Personal history of malignant neoplasm of pancreas: Secondary | ICD-10-CM | POA: Diagnosis not present

## 2024-01-12 DIAGNOSIS — I10 Essential (primary) hypertension: Secondary | ICD-10-CM | POA: Insufficient documentation

## 2024-01-12 DIAGNOSIS — Z951 Presence of aortocoronary bypass graft: Secondary | ICD-10-CM | POA: Diagnosis not present

## 2024-01-12 DIAGNOSIS — Z85820 Personal history of malignant melanoma of skin: Secondary | ICD-10-CM | POA: Insufficient documentation

## 2024-01-12 NOTE — Progress Notes (Signed)
 [facility] OFFICE PROGRESS NOTE   Diagnosis: Pancreas cancer  INTERVAL HISTORY:   David Jarvis was referred to cardiology when I saw him in February.  He was diagnosed with coronary artery disease and underwent coronary artery bypass surgery on 11/06/2023.  He reports resolution of angina.  He is participating in a cardiac rehabilitation program.  He has a good appetite. He has noted mild swelling in the right lower leg since undergoing bypass surgery.  There is tenderness at the medial distal thigh.  Objective:  Vital signs in last 24 hours:  Blood pressure 127/83, pulse 100, temperature 98.1 F (36.7 C), resp. rate 18, height 5\' 6"  (1.676 m), weight 141 lb 8 oz (64.2 kg), SpO2 100%.    Lymphatics: No cervical, supraclavicular, axillary, or inguinal nodes Resp: Lungs clear bilaterally Cardio: Regular rate and rhythm GI: No hepatosplenomegaly, no apparent ascites, nontender, no mass Vascular: Trace edema at the right lower leg, firm 4 cm area of nodular fullness at the distal medial right thigh-superior to the vein graft site  Skin: Surgical incisions of the chest and right leg appear healed  Lab Results:  Lab Results  Component Value Date   WBC 6.2 11/09/2023   HGB 8.4 (L) 11/09/2023   HCT 24.6 (L) 11/09/2023   MCV 89.8 11/09/2023   PLT 97 (L) 11/09/2023   NEUTROABS 6.7 05/12/2022    CMP  Lab Results  Component Value Date   NA 139 11/09/2023   K 4.5 11/09/2023   CL 104 11/09/2023   CO2 28 11/09/2023   GLUCOSE 126 (H) 11/09/2023   BUN 12 11/09/2023   CREATININE 1.07 11/09/2023   CALCIUM  8.1 (L) 11/09/2023   PROT 6.9 06/07/2022   ALBUMIN  4.2 06/07/2022   AST 63 (H) 06/07/2022   ALT 67 (H) 06/07/2022   ALKPHOS 369 (H) 06/07/2022   BILITOT 0.7 06/07/2022   GFRNONAA >60 11/09/2023    Lab Results  Component Value Date   [id number] 9 10/14/2023    Medications: I have reviewed the patient's current medications.   Assessment/Plan:  Pancreas cancer,  adenocarcinoma MRI/MRCP abdomen 05/18/2021-diffuse biliary and pancreatic duct dilation, 2.8 cm mass in the pancreas head, no evidence of metastatic disease ERCP 05/23/2021-localized stricture in the lower third of the main bile duct, stent placed EUS 05/23/2021-25 x 15 mm pancreas head mass with irregular borders, no lymphadenopathy, no vascular involvement, T2 N0 by ultrasound CT chest 06/01/2021-negative for metastatic disease CT abdomen pancreas protocol 06/01/2021-3.2 x 2.3 cm hypoenhancing pancreas head mass inseparable from the third portion of the duodenum, 1 cm left retroperitoneal lymph node, no vascular involvement Cycle 1 FOLFIRINOX 06/19/2021 Cycle 2 FOLFIRINOX 07/02/2021, irinotecan  and 5-FU dose reduced, prophylactic dexamethasone  added beginning day of pump discontinuation Cycle 3 FOLFIRINOX 07/23/2021, Udenyca  CT abdomen/pelvis 08/01/2021-done in the emergency department to evaluate epigastric pain-pancreas head mass decreased in size.  Diffuse gaseous distention of large and small bowel.  No bowel obstruction or focal inflammation. 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 Scherrie Curt reviewed pathology report from the  Whipple procedure with Dr. Zani, adjuvant radiation not recommended. CT abdomen/pelvis at [facility] 03/10/2022-fat stranding at the pancreaticojejunostomy CTs at [facility] 05/29/2022-no evidence of metastatic disease, nonspecific colitis of the sigmoid colon, unchanged mild intrahepatic biliary ductal dilatation CT pancreas protocol abdomen/pelvis at [facility] 01/06/2023-resolution of hepatic fluid collection, percutaneous biliary drains in position, no evidence of recurrent or metastatic disease in the abdomen or pelvis CT pancreas protocol abdomen/pelvis at [facility] 05/28/2023-wedge-shaped area of hypoattenuation in segment 7/8 less conspicuous, likely sequelae of percutaneous biliary drains no evidence of metastatic disease Obstructive jaundice secondary #1 Hypertension Admission in February 2022 with elevated liver enzymes MRI/MRCP-intra and extrahepatic biliary duct dilation, tapering smoothly to the ampulla, no mass or filling defect ERCP 10/06/2020-dilated main bile duct, biliary tree was swept and nothing was found, cytology from bile duct brushing-no malignant cells identified 5.  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  GERD 7.  Squamous cell carcinoma left forearm 2014 8.  Hyperlipidemia 9.  CT chest 08/01/2021, done in the emergency department to evaluate pleuritic chest pain-no PE.  Advanced three-vessel coronary vascular calcification. 10.  Fever of unknown origin beginning 08/14/2021-blood and urine cultures negative, no source for infection identified, placed on antibiotic prophylaxis beginning 08/17/2021, recurrent fever 10/09/2021-cultures negative, completed a course of Levaquin  11.  Fever/chills on 12/02/2021-urinalysis with clumps of white cells, culture positive for Staphylococcus epidermis and Enterococcus, completed a course of Augmentin Persistent fevers September 2023 ERCP 06/03/2022-stenotic bile duct orifice, biliary tract could not be accessed Multiple percutaneous biliary drain procedures at [facility]  with placement of external/internal drains to manage a stricture at the hepaticojejunostomy, last procedure to upsize the drains on 10/16/2022 02/07/2023-external biliary drains removed 12.  Coronary artery bypass surgery 11/06/2023     Disposition: David Jarvis is in clinical remission from pancreas cancer.  Will follow-up on the CA 19-9 from today.  He will return for an office visit in 4 months. He underwent coronary bypass surgery 11/06/2023.  There is mild swelling the right lower leg and firm palpable nodularity at the medial distal right thigh.  I suspect the swelling is related to surgery and the right leg nodule is likely a hematoma from the vein graft procedure.  We will refer him for a Doppler of the right lower extremity.    Coni Deep, MD  01/12/2024  10:04 AM

## 2024-01-13 ENCOUNTER — Ambulatory Visit: Payer: Self-pay | Admitting: Oncology

## 2024-01-13 ENCOUNTER — Telehealth: Payer: Self-pay | Admitting: Oncology

## 2024-01-13 LAB — CANCER ANTIGEN 19-9: CA 19-9: 11 U/mL (ref 0–35)

## 2024-01-13 NOTE — Telephone Encounter (Signed)
 Patient has been scheduled for follow-up visit per 01/12/24 LOS.  LVM notifying pt of appt details, provided my direct number to pt if appt changes need to be made.

## 2024-01-14 ENCOUNTER — Encounter (HOSPITAL_COMMUNITY)
Admission: RE | Admit: 2024-01-14 | Discharge: 2024-01-14 | Disposition: A | Discharge status: Home / Self Care | Source: Ambulatory Visit | Attending: Cardiovascular Disease | Admitting: Cardiovascular Disease

## 2024-01-14 DIAGNOSIS — Z951 Presence of aortocoronary bypass graft: Secondary | ICD-10-CM | POA: Diagnosis not present

## 2024-01-15 ENCOUNTER — Encounter: Payer: Self-pay | Admitting: Cardiovascular Disease

## 2024-01-15 MED ORDER — METOPROLOL TARTRATE 25 MG PO TABS: 12.5000 mg | ORAL_TABLET | Freq: Two times a day (BID) | ORAL | 1 refills | Status: DC

## 2024-01-15 NOTE — Telephone Encounter (Signed)
 Please refer to Dr. Alfredo Ano.

## 2024-01-19 ENCOUNTER — Encounter (HOSPITAL_COMMUNITY)
Admission: RE | Admit: 2024-01-19 | Discharge: 2024-01-19 | Disposition: A | Discharge status: Home / Self Care | Source: Ambulatory Visit | Attending: Cardiovascular Disease | Admitting: Cardiovascular Disease

## 2024-01-19 DIAGNOSIS — Z951 Presence of aortocoronary bypass graft: Secondary | ICD-10-CM | POA: Diagnosis not present

## 2024-01-20 NOTE — Progress Notes (Signed)
 Cardiac Individual Treatment Plan  Patient Details  Name: David Jarvis MRN: [id number] Date of Birth: [birth year] Referring Provider:   Flowsheet Row INTENSIVE CARDIAC REHAB ORIENT from 12/25/2023 in [facility], V[facility], & [facility]  Referring Provider Adair Hollingshead, MD       Initial Encounter Date:  Flowsheet Row INTENSIVE CARDIAC REHAB ORIENT from 12/25/2023 in [facility] for [facility], Vascular, [facility]  Date 12/25/23       Visit Diagnosis: S/P CABG x 3  Patient's Home Medications on Admission:  Current Outpatient Medications:    aspirin  EC 81 MG tablet, Take 81 mg by mouth daily., Disp: , Rfl:    levothyroxine  (SYNTHROID ) 25 MCG tablet, Take 1 tablet by mouth daily before breakfast, Disp: 90 tablet, Rfl: 3   metoprolol  tartrate (LOPRESSOR ) 25 MG tablet, Take 0.5 tablets (12.5 mg total) by mouth 2 (two) times daily., Disp: 30 tablet, Rfl: 1   pantoprazole  (PROTONIX ) 40 MG tablet, TAKE ONE TABLET BY MOUTH EVERY DAY 90 days, Disp: 90 tablet, Rfl: 3   rosuvastatin  (CRESTOR ) 20 MG tablet, Take 1 tablet (20 mg total) by mouth daily., Disp: 90 tablet, Rfl: 3   tamsulosin  (FLOMAX ) 0.4 MG CAPS capsule, Take 1 capsule by mouth once daily, Disp: 90 capsule, Rfl: 4 No current facility-administered medications for this encounter.  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)   BPV (benign positional vertigo)    Colon polyps    GERD (gastroesophageal reflux disease)    Glucose intolerance (impaired glucose tolerance)    History of nuclear stress test    ETT-Myoview  2/18: EF 57,  no ischemia, low risk   Hyperlipidemia    Hypertension    Melanoma (HCC) 07/22/2008   RIGHT TEMPLE END STAGE LICHENORO REGRESSION MELANODERMA TX MOHS   Right knee pain    SCC (squamous cell carcinoma) 02/24/2013   left inner forearm ([id number])   Vitreous floaters of both eyes     Tobacco Use: Social History   Tobacco Use  Smoking Status Never  Smokeless Tobacco Never    Labs: Review Flowsheet       Latest Ref Rng & Units 11/05/2023 11/06/2023  Labs for ITP Cardiac and Pulmonary Rehab  Cholestrol 0 - 200 mg/dL - 83   LDL (calc) 0 - 99 mg/dL - 43   HDL-C >96 mg/dL - 30   Trlycerides <295 mg/dL - 48   Hemoglobin M8U 4.8 - 5.6 % 5.2  -  PH, Arterial 7.35 - 7.45 7.52  7.491  7.366  7.383  7.403  7.364   PCO2 arterial 32 - 48 mmHg 34  27.3  39.4  39.8  39.8  40.6   Bicarbonate 20.0 - 28.0 mmol/L 27.1  20.9  23.0  23.7  24.8  23.2   TCO2 22 - 32 mmol/L - 22  24  25  25  26  26  27  27  26  26  25  24    Acid-base deficit 0.0 - 2.0 mmol/L - 2.0  3.0  1.0  2.0   O2 Saturation % 96.2  100  99  100  100  100     Details       Multiple values from one day are sorted in reverse-chronological order         Capillary Blood Glucose: Lab Results  Component Value Date   GLUCAP 141 (H) 11/08/2023   GLUCAP 104 (H) 11/08/2023   GLUCAP 96 11/08/2023   GLUCAP 121 (H) 11/07/2023   GLUCAP 148 (H) 11/07/2023     Exercise Target Goals: Exercise Program Goal: Individual exercise prescription set using results from initial 6 min walk test and THRR while considering  patient's activity barriers and safety.   Exercise Prescription Goal: Initial exercise prescription builds to 30-45 minutes a day of aerobic activity, 2-3 days per week.  Home exercise guidelines will be given to patient during program as part of exercise prescription that the participant will acknowledge.  Activity Barriers & Risk Stratification:  Activity Barriers & Cardiac Risk Stratification - 12/25/23 1124        Activity Barriers & Cardiac Risk Stratification   Activity Barriers Incisional Pain;Deconditioning    Cardiac Risk Stratification High   <5 METs on            6 Minute Walk:  6 Minute Walk     Row Name 12/25/23 1124         6 Minute Walk   Phase Initial     Distance 1440 feet     Walk Time 6 minutes     # of Rest Breaks 0     MPH 2.73     METS 3.44     RPE 11     Perceived Dyspnea  0     VO2 Peak 12.03     Symptoms No     Resting HR 69 bpm     Resting BP 122/64     Resting Oxygen Saturation  98 %     Exercise Oxygen Saturation  during 6 min walk 98 %     Max Ex. HR 101 bpm     Max Ex. BP 142/70     2 Minute Post BP 130/76              Oxygen Initial Assessment:   Oxygen Re-Evaluation:   Oxygen Discharge (Final Oxygen Re-Evaluation):   Initial Exercise Prescription:  Initial Exercise Prescription - 12/25/23 1100       Date of Initial Exercise RX and Referring Provider   Date 12/25/23    Referring Provider Adair Hollingshead, MD    Expected Discharge Date 03/17/24      Bike   Level 2    Watts 40    Minutes 15    METs 2.5      Recumbant Elliptical   Level 1    RPM 60    Watts 70    Minutes 15    METs 2.5      Prescription Details   Frequency (times per week) 3    Duration Progress to 30 minutes of continuous aerobic without signs/symptoms of physical distress      Intensity   THRR 40-80% of Max Heartrate 60-119    Ratings of Perceived Exertion 11-13    Perceived Dyspnea 0-4      Progression   Progression Continue progressive overload as per policy without signs/symptoms or physical distress.      Resistance Training   Training Prescription Yes    Weight 3    Reps 10-15  Perform Capillary Blood Glucose checks as needed.  Exercise Prescription Changes:   Exercise Prescription Changes     Row Name 12/29/23 1600 01/19/24 1400           Response to Exercise   Blood Pressure (Admit) 104/64 122/68       Blood Pressure (Exercise) 128/64 --      Blood Pressure (Exit) 112/72 102/56      Heart Rate (Admit) 59 bpm 66 bpm      Heart Rate (Exercise) 92 bpm 112 bpm      Heart Rate (Exit) 68 bpm 78 bpm      Rating of Perceived Exertion (Exercise) 8.5 12      Symptoms None None      Comments Pt's first day in the CRP2 program Reviewed METs      Duration Continue with 30 min of aerobic exercise without signs/symptoms of physical distress. Continue with 30 min of aerobic exercise without signs/symptoms of physical distress.      Intensity THRR unchanged THRR unchanged        Progression   Progression Continue to progress workloads to maintain intensity without signs/symptoms of physical distress. Continue to progress workloads to maintain intensity without signs/symptoms of physical distress.      Average METs 4 4.9        Resistance Training   Training Prescription Yes Yes      Weight 3 lbs 3 lbs      Reps 10-15 10-15      Time 5 Minutes 5 Minutes        Interval Training   Interval Training No No        Bike   Level 3 4      Watts 62 --      Minutes 15 15      METs 3.6 --        Recumbant Elliptical   Level 4 4      RPM 43 51      Watts 71 84      Minutes 15 15      METs 4.5 4.9               Exercise Comments:   Exercise Comments     Row Name 12/29/23 1645 01/19/24 1411         Exercise Comments Pt's first day in the CRP2 program. Pt exercised without complaints and is off to a good start. Reviewed METs with pt, pt has increased his METs from 3.8-4.9. Pt self efficacy has increased in the program. He is increasing his WL as tolerated by himself and is happy with his progress Pt is satisfied with current ExRx.               Exercise Goals and Review:   Exercise Goals     Row Name 12/25/23 1124             Exercise Goals   Increase Physical Activity Yes       Intervention Provide advice, education, support and counseling about physical activity/exercise  needs.;Develop an individualized exercise prescription for aerobic and resistive training based on initial evaluation findings, risk stratification, comorbidities and participant's personal goals.       Expected Outcomes Short Term: Attend rehab on a regular basis to increase amount of physical activity.;Long Term: Exercising regularly at least 3-5 days a week.;Long Term: Add in home exercise to make exercise part of routine and to increase amount of physical activity.  Increase Strength and Stamina Yes       Intervention Provide advice, education, support and counseling about physical activity/exercise needs.;Develop an individualized exercise prescription for aerobic and resistive training based on initial evaluation findings, risk stratification, comorbidities and participant's personal goals.       Expected Outcomes Short Term: Increase workloads from initial exercise prescription for resistance, speed, and METs.;Short Term: Perform resistance training exercises routinely during rehab and add in resistance training at home;Long Term: Improve cardiorespiratory fitness, muscular endurance and strength as measured by increased METs and functional capacity ( )       Able to understand and use rate of perceived exertion (RPE) scale Yes       Intervention Provide education and explanation on how to use RPE scale       Expected Outcomes Short Term: Able to use RPE daily in rehab to express subjective intensity level;Long Term:  Able to use RPE to guide intensity level when exercising independently       Knowledge and understanding of Target Heart Rate Range (THRR) Yes       Intervention Provide education and explanation of THRR including how the numbers were predicted and where they are located for reference       Expected Outcomes Short Term: Able to state/look up THRR;Short Term: Able to use daily as guideline for intensity in rehab;Long Term: Able to use THRR to govern intensity when exercising  independently       Understanding of Exercise Prescription Yes       Intervention Provide education, explanation, and written materials on patient's individual exercise prescription       Expected Outcomes Long Term: Able to explain home exercise prescription to exercise independently;Short Term: Able to explain program exercise prescription                Exercise Goals Re-Evaluation :  Exercise Goals Re-Evaluation     Row Name 12/29/23 1644 01/19/24 1409           Exercise Goal Re-Evaluation   Exercise Goals Review Increase Physical Activity;Increase Strength and Stamina;Able to understand and use rate of perceived exertion (RPE) scale;Knowledge and understanding of Target Heart Rate Range (THRR);Understanding of Exercise Prescription Increase Physical Activity;Increase Strength and Stamina;Able to understand and use rate of perceived exertion (RPE) scale;Knowledge and understanding of Target Heart Rate Range (THRR);Understanding of Exercise Prescription      Comments Pt's first day in the CRP2 program. Pt understands the exercise Rx, RPE sclae and THRR. Reviewed METs with pt, pt has increased his METs from 3.8-4.9. Pt self efficacy has increased in the program. He is increasing his WL as tolerated by himself and is happy with his progress Pt is satisfied with current ExRx.      Expected Outcomes Will continue to monitor patient and progress workloads as tolerated. Will continue to monitor patient and progress workloads as tolerated.               Discharge Exercise Prescription (Final Exercise Prescription Changes):  Exercise Prescription Changes - 01/19/24 1400       Response to Exercise   Blood Pressure (Admit) 122/68    Blood Pressure (Exit) 102/56    Heart Rate (Admit) 66 bpm    Heart Rate (Exercise) 112 bpm    Heart Rate (Exit) 78 bpm    Rating of Perceived Exertion (Exercise) 12    Symptoms None    Comments Reviewed METs    Duration Continue with 30 min of aerobic  exercise without signs/symptoms of physical distress.    Intensity THRR unchanged      Progression   Progression Continue to progress workloads to maintain intensity without signs/symptoms of physical distress.    Average METs 4.9      Resistance Training   Training Prescription Yes    Weight 3 lbs    Reps 10-15    Time 5 Minutes      Interval Training   Interval Training No      Bike   Level 4    Minutes 15      Recumbant Elliptical   Level 4    RPM 51    Watts 84    Minutes 15    METs 4.9             Nutrition:  Target Goals: Understanding of nutrition guidelines, daily intake of sodium 1500mg , cholesterol 200mg , calories 30% from fat and 7% or less from saturated fats, daily to have 5 or more servings of fruits and vegetables.  Biometrics:  Pre Biometrics - 12/25/23 1005       Pre Biometrics   Waist Circumference 31.5 inches    Hip Circumference 35 inches    Waist to Hip Ratio 0.9 %    Triceps Skinfold 8 mm    % Body Fat 19.5 %    Grip Strength 30 kg    Flexibility 10.5 in    Single Leg Stand 20.75 seconds              Nutrition Therapy Plan and Nutrition Goals:  Nutrition Therapy & Goals - 12/29/23 1432       Nutrition Therapy   Diet Heart healthy Diet    Drug/Food Interactions Statins/Certain Fruits      Personal Nutrition Goals   Nutrition Goal Patient to identify strategies for reducing cardiovascular risk by attending the Pritikin education and nutrition series weekly.    Personal Goal #2 Patient to improve diet quality by using the plate method as a guide for meal planning to include lean protein/plant protein, fruits, vegetables, whole grains, nonfat dairy as part of a well-balanced diet.    Comments David Jarvis has medical history of CABGx3, HTN, hyperlipidemia, pancreatic adenocarcinoma s/p chemotherapy + whipple procedure. LDL is well controlled. Patient has struggled to maintain weight since whipple procedure in 2024. Patient will benefit  from participation in intensive cardiac rehab for nutrition, exercise, and lifestyle modification.      Intervention Plan   Intervention Prescribe, educate and counsel regarding individualized specific dietary modifications aiming towards targeted core components such as weight, hypertension, lipid management, diabetes, heart failure and other comorbidities.;Nutrition handout(s) given to patient.    Expected Outcomes Short Term Goal: Understand basic principles of dietary content, such as calories, fat, sodium, cholesterol and nutrients.;Long Term Goal: Adherence to prescribed nutrition plan.             Nutrition Assessments:  Nutrition Assessments - 01/08/24 1517       Rate Your Plate Scores   Pre Score 58            MEDIFICTS Score David Jarvis: >=70 Need to make dietary changes  40-70 Heart Healthy Diet <= 40 Therapeutic Level Cholesterol Diet   Flowsheet Row INTENSIVE CARDIAC REHAB from 01/07/2024 in [facility] for Heart, Vascular, & Lung Health  Picture Your Plate Total Score on Admission 58      Picture Your Plate Scores: <16 Unhealthy dietary pattern with much room for improvement. 41-50 Dietary  pattern unlikely to meet recommendations for good health and room for improvement. 51-60 More healthful dietary pattern, with some room for improvement.  >60 Healthy dietary pattern, although there may be some specific behaviors that could be improved.    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 LDL is well controlled. Patient has struggled to maintain weight since whipple procedure in 2024. Patient will benefit from participation in intensive cardiac rehab  for nutrition, exercise, and lifestyle modification.                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 LDL is well controlled. Patient has struggled to maintain weight since whipple procedure in 2024. Patient will benefit from participation in intensive cardiac rehab for nutrition, exercise, and lifestyle modification.                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 LDL is well controlled. Patient has struggled to maintain weight since whipple procedure in 2024. Patient will benefit from participation in intensive cardiac rehab for nutrition, exercise, and lifestyle modification.             Psychosocial: Target Goals: Acknowledge presence or absence of significant depression and/or stress, maximize coping skills, provide positive support system. Participant is able to verbalize types and ability to use techniques and skills needed for reducing stress and depression.  Initial Review & Psychosocial Screening:  Initial Psych Review & Screening - 12/25/23 1008       Initial Review   Current issues with None Identified      Family Dynamics   Good Support System? Yes   Wife for support     Barriers   Psychosocial barriers to participate in program There are no identifiable barriers or psychosocial needs.      Screening Interventions   Interventions  Encouraged to exercise;Provide feedback about the scores to participant    Expected Outcomes Short Term goal: Identification and review with participant of any Quality of Life or Depression concerns found by scoring the questionnaire.;Long Term goal: The participant improves quality of Life and PHQ9 Scores as seen by post scores and/or verbalization of changes             Quality of Life Scores:  Quality of Life - 12/25/23 1129       Quality of Life   Select Quality of Life  Quality of Life Scores   Health/Function Pre 28.8 %    Socioeconomic Pre 30 %    Psych/Spiritual Pre 30 %    Family Pre 30 %    GLOBAL Pre 29.45 %            Scores of 19 and below usually indicate a poorer quality of life in these areas.  A difference of  2-3 points is a clinically meaningful difference.  A difference of 2-3 points in the total score of the Quality of Life Index has been associated with significant improvement in overall quality of life, self-image, physical symptoms, and general health in studies assessing change in quality of life.  PHQ-9: Review Flowsheet       12/25/2023  Depression screen PHQ 2/9  Decreased Interest 0  Down, Depressed, Hopeless 0  PHQ - 2 Score 0  Altered sleeping 0  Tired, decreased energy 0  Change in appetite 0  Feeling bad or failure about yourself  0  Trouble concentrating 0  Moving slowly or fidgety/restless 0  Suicidal thoughts 0  PHQ-9 Score 0   Interpretation of Total Score  Total Score Depression Severity:  1-4 = Minimal depression, 5-9 = Mild depression, 10-14 = Moderate depression, 15-19 = Moderately severe depression, 20-27 = Severe depression   Psychosocial Evaluation and Intervention:   Psychosocial Re-Evaluation:  Psychosocial Re-Evaluation     Row Name 12/30/23 1532 01/20/24 1725           Psychosocial Re-Evaluation   Current issues with None Identified None Identified      Interventions Encouraged to attend Cardiac  Rehabilitation for the exercise Encouraged to attend Cardiac Rehabilitation for the exercise      Continue Psychosocial Services  No Follow up required No Follow up required               Psychosocial Discharge (Final Psychosocial Re-Evaluation):  Psychosocial Re-Evaluation - 01/20/24 1725       Psychosocial Re-Evaluation   Current issues with None Identified    Interventions Encouraged to attend Cardiac Rehabilitation for the exercise    Continue Psychosocial Services  No Follow up required             Vocational Rehabilitation: Provide vocational rehab assistance to qualifying candidates.   Vocational Rehab Evaluation & Intervention:  Vocational Rehab - 12/25/23 1009       Initial Vocational Rehab Evaluation & Intervention   Assessment shows need for Vocational Rehabilitation No   Retired            Education: Education Goals: Education classes will be provided on a weekly basis, covering required topics. Participant will state understanding/return demonstration of topics presented.    Education     Row Name 12/29/23 1300     Education   Cardiac Education Topics Pritikin   Geographical information systems officer Psychosocial   Psychosocial Workshop From Head to Heart: The Power of a Healthy Outlook   Instruction Review Code 1- Verbalizes Understanding   Class Start Time 1400   Class Stop Time 1449   Class Time Calculation (min) 49 min    Row Name 12/31/23 1600     Education   Cardiac Education Topics Pritikin   Customer service manager   Weekly Topic Tasty Appetizers and Snacks   Instruction Review Code 1- Verbalizes Understanding   Class Start Time 1400  Class Stop Time 1445   Class Time Calculation (min) 45 min    Row Name 01/05/24 1600     Education   Cardiac Education Topics Pritikin   Nurse, children's Exercise Physiologist    Select Psychosocial   Psychosocial Healthy Minds, Bodies, Hearts   Instruction Review Code 1- Verbalizes Understanding   Class Start Time 1410   Class Stop Time 1445   Class Time Calculation (min) 35 min    Row Name 01/07/24 1500     Education   Cardiac Education Topics Pritikin   Customer service manager   Weekly Topic Adding Flavor - Sodium-Free   Instruction Review Code 1- Verbalizes Understanding   Class Start Time 1400   Class Stop Time 1440   Class Time Calculation (min) 40 min            Core Videos: Exercise    Move It!  Clinical staff conducted group or individual video education with verbal and written material and guidebook.  Patient learns the recommended Pritikin exercise program. Exercise with the goal of living a long, healthy life. Some of the health benefits of exercise include controlled diabetes, healthier blood pressure levels, improved cholesterol levels, improved heart and lung capacity, improved sleep, and better body composition. Everyone should speak with their doctor before starting or changing an exercise routine.  Biomechanical Limitations Clinical staff conducted group or individual video education with verbal and written material and guidebook.  Patient learns how biomechanical limitations can impact exercise and how we can mitigate and possibly overcome limitations to have an impactful and balanced exercise routine.  Body Composition Clinical staff conducted group or individual video education with verbal and written material and guidebook.  Patient learns that body composition (ratio of muscle mass to fat mass) is a David Jarvis component to assessing overall fitness, rather than body weight alone. Increased fat mass, especially visceral belly fat, can put us  at increased risk for metabolic syndrome, type 2 diabetes, heart disease, and even death. It is recommended to combine diet and exercise (cardiovascular and  resistance training) to improve your body composition. Seek guidance from your physician and exercise physiologist before implementing an exercise routine.  Exercise Action Plan Clinical staff conducted group or individual video education with verbal and written material and guidebook.  Patient learns the recommended strategies to achieve and enjoy long-term exercise adherence, including variety, self-motivation, self-efficacy, and positive decision making. Benefits of exercise include fitness, good health, weight management, more energy, better sleep, less stress, and overall well-being.  Medical   Heart Disease Risk Reduction Clinical staff conducted group or individual video education with verbal and written material and guidebook.  Patient learns our heart is our most vital organ as it circulates oxygen, nutrients, white blood cells, and hormones throughout the entire body, and carries waste away. Data supports a plant-based eating plan like the Pritikin Program for its effectiveness in slowing progression of and reversing heart disease. The video provides a number of recommendations to address heart disease.   Metabolic Syndrome and Belly Fat  Clinical staff conducted group or individual video education with verbal and written material and guidebook.  Patient learns what metabolic syndrome is, how it leads to heart disease, and how one can reverse it and keep it from coming back. You have metabolic syndrome if you have 3 of the following 5 criteria: abdominal obesity, high blood pressure, high  triglycerides, low HDL cholesterol, and high blood sugar.  Hypertension and Heart Disease Clinical staff conducted group or individual video education with verbal and written material and guidebook.  Patient learns that high blood pressure, or hypertension, is very common in the [place] . Hypertension is largely due to excessive salt intake, but other important risk factors include being overweight,  physical inactivity, drinking too much alcohol, smoking, and not eating enough potassium from fruits and vegetables. High blood pressure is a leading risk factor for heart attack, stroke, congestive heart failure, dementia, kidney failure, and premature death. Long-term effects of excessive salt intake include stiffening of the arteries and thickening of heart muscle and organ damage. Recommendations include ways to reduce hypertension and the risk of heart disease.  Diseases of Our Time - Focusing on Diabetes Clinical staff conducted group or individual video education with verbal and written material and guidebook.  Patient learns why the best way to stop diseases of our time is prevention, through food and other lifestyle changes. Medicine (such as prescription pills and surgeries) is often only a Band-Aid on the problem, not a long-term solution. Most common diseases of our time include obesity, type 2 diabetes, hypertension, heart disease, and cancer. The Pritikin Program is recommended and has been proven to help reduce, reverse, and/or prevent the damaging effects of metabolic syndrome.  Nutrition   Overview of the Pritikin Eating Plan  Clinical staff conducted group or individual video education with verbal and written material and guidebook.  Patient learns about the Pritikin Eating Plan for disease risk reduction. The Pritikin Eating Plan emphasizes a wide variety of unrefined, minimally-processed carbohydrates, like fruits, vegetables, whole grains, and legumes. Go, Caution, and Stop food choices are explained. Plant-based and lean animal proteins are emphasized. Rationale provided for low sodium intake for blood pressure control, low added sugars for blood sugar stabilization, and low added fats and oils for coronary artery disease risk reduction and weight management.  Calorie Density  Clinical staff conducted group or individual video education with verbal and written material and  guidebook.  Patient learns about calorie density and how it impacts the Pritikin Eating Plan. Knowing the characteristics of the food you choose will help you decide whether those foods will lead to weight gain or weight loss, and whether you want to consume more or less of them. Weight loss is usually a side effect of the Pritikin Eating Plan because of its focus on low calorie-dense foods.  Label Reading  Clinical staff conducted group or individual video education with verbal and written material and guidebook.  Patient learns about the Pritikin recommended label reading guidelines and corresponding recommendations regarding calorie density, added sugars, sodium content, and whole grains.  Dining Out - Part 1  Clinical staff conducted group or individual video education with verbal and written material and guidebook.  Patient learns that restaurant meals can be sabotaging because they can be so high in calories, fat, sodium, and/or sugar. Patient learns recommended strategies on how to positively address this and avoid unhealthy pitfalls.  Facts on Fats  Clinical staff conducted group or individual video education with verbal and written material and guidebook.  Patient learns that lifestyle modifications can be just as effective, if not more so, as many medications for lowering your risk of heart disease. A Pritikin lifestyle can help to reduce your risk of inflammation and atherosclerosis (cholesterol build-up, or plaque, in the artery walls). Lifestyle interventions such as dietary choices and physical activity address the cause of  atherosclerosis. A review of the types of fats and their impact on blood cholesterol levels, along with dietary recommendations to reduce fat intake is also included.  Nutrition Action Plan  Clinical staff conducted group or individual video education with verbal and written material and guidebook.  Patient learns how to incorporate Pritikin recommendations into  their lifestyle. Recommendations include planning and keeping personal health goals in mind as an important part of their success.  Healthy Mind-Set    Healthy Minds, Bodies, Hearts  Clinical staff conducted group or individual video education with verbal and written material and guidebook.  Patient learns how to identify when they are stressed. Video will discuss the impact of that stress, as well as the many benefits of stress management. Patient will also be introduced to stress management techniques. The way we think, act, and feel has an impact on our hearts.  How Our Thoughts Can Heal Our Hearts  Clinical staff conducted group or individual video education with verbal and written material and guidebook.  Patient learns that negative thoughts can cause depression and anxiety. This can result in negative lifestyle behavior and serious health problems. Cognitive behavioral therapy is an effective method to help control our thoughts in order to change and improve our emotional outlook.  Additional Videos:  Exercise    Improving Performance  Clinical staff conducted group or individual video education with verbal and written material and guidebook.  Patient learns to use a non-linear approach by alternating intensity levels and lengths of time spent exercising to help burn more calories and lose more body fat. Cardiovascular exercise helps improve heart health, metabolism, hormonal balance, blood sugar control, and recovery from fatigue. Resistance training improves strength, endurance, balance, coordination, reaction time, metabolism, and muscle mass. Flexibility exercise improves circulation, posture, and balance. Seek guidance from your physician and exercise physiologist before implementing an exercise routine and learn your capabilities and proper form for all exercise.  Introduction to Yoga  Clinical staff conducted group or individual video education with verbal and written material and  guidebook.  Patient learns about yoga, a discipline of the coming together of mind, breath, and body. The benefits of yoga include improved flexibility, improved range of motion, better posture and core strength, increased lung function, weight loss, and positive self-image. Yoga's heart health benefits include lowered blood pressure, healthier heart rate, decreased cholesterol and triglyceride levels, improved immune function, and reduced stress. Seek guidance from your physician and exercise physiologist before implementing an exercise routine and learn your capabilities and proper form for all exercise.  Medical   Aging: Enhancing Your Quality of Life  Clinical staff conducted group or individual video education with verbal and written material and guidebook.  Patient learns David Jarvis strategies and recommendations to stay in good physical health and enhance quality of life, such as prevention strategies, having an advocate, securing a Health Care Proxy and Power of Attorney, and keeping a list of medications and system for tracking them. It also discusses how to avoid risk for bone loss.  Biology of Weight Control  Clinical staff conducted group or individual video education with verbal and written material and guidebook.  Patient learns that weight gain occurs because we consume more calories than we burn (eating more, moving less). Even if your body weight is normal, you may have higher ratios of fat compared to muscle mass. Too much body fat puts you at increased risk for cardiovascular disease, heart attack, stroke, type 2 diabetes, and obesity-related cancers. In addition to exercise,  following the Pritikin Eating Plan can help reduce your risk.  Decoding Lab Results  Clinical staff conducted group or individual video education with verbal and written material and guidebook.  Patient learns that lab test reflects one measurement whose values change over time and are influenced by many factors,  including medication, stress, sleep, exercise, food, hydration, pre-existing medical conditions, and more. It is recommended to use the knowledge from this video to become more involved with your lab results and evaluate your numbers to speak with your doctor.   Diseases of Our Time - Overview  Clinical staff conducted group or individual video education with verbal and written material and guidebook.  Patient learns that according to the CDC, 50% to 70% of chronic diseases (such as obesity, type 2 diabetes, elevated lipids, hypertension, and heart disease) are avoidable through lifestyle improvements including healthier food choices, listening to satiety cues, and increased physical activity.  Sleep Disorders Clinical staff conducted group or individual video education with verbal and written material and guidebook.  Patient learns how good quality and duration of sleep are important to overall health and well-being. Patient also learns about sleep disorders and how they impact health along with recommendations to address them, including discussing with a physician.  Nutrition  Dining Out - Part 2 Clinical staff conducted group or individual video education with verbal and written material and guidebook.  Patient learns how to plan ahead and communicate in order to maximize their dining experience in a healthy and nutritious manner. Included are recommended food choices based on the type of restaurant the patient is visiting.   Fueling a Banker conducted group or individual video education with verbal and written material and guidebook.  There is a strong connection between our food choices and our health. Diseases like obesity and type 2 diabetes are very prevalent and are in large-part due to lifestyle choices. The Pritikin Eating Plan provides plenty of food and hunger-curbing satisfaction. It is easy to follow, affordable, and helps reduce health risks.  Menu Workshop   Clinical staff conducted group or individual video education with verbal and written material and guidebook.  Patient learns that restaurant meals can sabotage health goals because they are often packed with calories, fat, sodium, and sugar. Recommendations include strategies to plan ahead and to communicate with the manager, chef, or server to help order a healthier meal.  Planning Your Eating Strategy  Clinical staff conducted group or individual video education with verbal and written material and guidebook.  Patient learns about the Pritikin Eating Plan and its benefit of reducing the risk of disease. The Pritikin Eating Plan does not focus on calories. Instead, it emphasizes high-quality, nutrient-rich foods. By knowing the characteristics of the foods, we choose, we can determine their calorie density and make informed decisions.  Targeting Your Nutrition Priorities  Clinical staff conducted group or individual video education with verbal and written material and guidebook.  Patient learns that lifestyle habits have a tremendous impact on disease risk and progression. This video provides eating and physical activity recommendations based on your personal health goals, such as reducing LDL cholesterol, losing weight, preventing or controlling type 2 diabetes, and reducing high blood pressure.  Vitamins and Minerals  Clinical staff conducted group or individual video education with verbal and written material and guidebook.  Patient learns different ways to obtain David Jarvis vitamins and minerals, including through a recommended healthy diet. It is important to discuss all supplements you take with your doctor.  Healthy Mind-Set    Smoking Cessation  Clinical staff conducted group or individual video education with verbal and written material and guidebook.  Patient learns that cigarette smoking and tobacco addiction pose a serious health risk which affects millions of people. Stopping smoking  will significantly reduce the risk of heart disease, lung disease, and many forms of cancer. Recommended strategies for quitting are covered, including working with your doctor to develop a successful plan.  Culinary   Becoming a Set designer conducted group or individual video education with verbal and written material and guidebook.  Patient learns that cooking at home can be healthy, cost-effective, quick, and puts them in control. Keys to cooking healthy recipes will include looking at your recipe, assessing your equipment needs, planning ahead, making it simple, choosing cost-effective seasonal ingredients, and limiting the use of added fats, salts, and sugars.  Cooking - Breakfast and Snacks  Clinical staff conducted group or individual video education with verbal and written material and guidebook.  Patient learns how important breakfast is to satiety and nutrition through the entire day. Recommendations include David Jarvis foods to eat during breakfast to help stabilize blood sugar levels and to prevent overeating at meals later in the day. Planning ahead is also a David Jarvis component.  Cooking - Educational psychologist conducted group or individual video education with verbal and written material and guidebook.  Patient learns eating strategies to improve overall health, including an approach to cook more at home. Recommendations include thinking of animal protein as a side on your plate rather than center stage and focusing instead on lower calorie dense options like vegetables, fruits, whole grains, and plant-based proteins, such as beans. Making sauces in large quantities to freeze for later and leaving the skin on your vegetables are also recommended to maximize your experience.  Cooking - Healthy Salads and Dressing Clinical staff conducted group or individual video education with verbal and written material and guidebook.  Patient learns that vegetables, fruits, whole  grains, and legumes are the foundations of the Pritikin Eating Plan. Recommendations include how to incorporate each of these in flavorful and healthy salads, and how to create homemade salad dressings. Proper handling of ingredients is also covered. Cooking - Soups and [employer] - Soups and Desserts Clinical staff conducted group or individual video education with verbal and written material and guidebook.  Patient learns that Pritikin soups and desserts make for easy, nutritious, and delicious snacks and meal components that are low in sodium, fat, sugar, and calorie density, while high in vitamins, minerals, and filling fiber. Recommendations include simple and healthy ideas for soups and desserts.   Overview     The Pritikin Solution Program Overview Clinical staff conducted group or individual video education with verbal and written material and guidebook.  Patient learns that the results of the Pritikin Program have been documented in more than 100 articles published in peer-reviewed journals, and the benefits include reducing risk factors for (and, in some cases, even reversing) high cholesterol, high blood pressure, type 2 diabetes, obesity, and more! An overview of the three David Jarvis pillars of the Pritikin Program will be covered: eating well, doing regular exercise, and having a healthy mind-set.  WORKSHOPS  Exercise: Exercise Basics: Building Your Action Plan Clinical staff led group instruction and group discussion with PowerPoint presentation and patient guidebook. To enhance the learning environment the use of posters, models and videos may be added. At the conclusion of this workshop, patients  will comprehend the difference between physical activity and exercise, as well as the benefits of incorporating both, into their routine. Patients will understand the FITT (Frequency, Intensity, Time, and Type) principle and how to use it to build an exercise action plan. In addition,  safety concerns and other considerations for exercise and cardiac rehab will be addressed by the presenter. The purpose of this lesson is to promote a comprehensive and effective weekly exercise routine in order to improve patients' overall level of fitness.   Managing Heart Disease: Your Path to a Healthier Heart Clinical staff led group instruction and group discussion with PowerPoint presentation and patient guidebook. To enhance the learning environment the use of posters, models and videos may be added.At the conclusion of this workshop, patients will understand the anatomy and physiology of the heart. Additionally, they will understand how Pritikin's three pillars impact the risk factors, the progression, and the management of heart disease.  The purpose of this lesson is to provide a high-level overview of the heart, heart disease, and how the Pritikin lifestyle positively impacts risk factors.  Exercise Biomechanics Clinical staff led group instruction and group discussion with PowerPoint presentation and patient guidebook. To enhance the learning environment the use of posters, models and videos may be added. Patients will learn how the structural parts of their bodies function and how these functions impact their daily activities, movement, and exercise. Patients will learn how to promote a neutral spine, learn how to manage pain, and identify ways to improve their physical movement in order to promote healthy living. The purpose of this lesson is to expose patients to common physical limitations that impact physical activity. Participants will learn practical ways to adapt and manage aches and pains, and to minimize their effect on regular exercise. Patients will learn how to maintain good posture while sitting, walking, and lifting.  Balance Training and Fall Prevention  Clinical staff led group instruction and group discussion with PowerPoint presentation and patient guidebook. To  enhance the learning environment the use of posters, models and videos may be added. At the conclusion of this workshop, patients will understand the importance of their sensorimotor skills (vision, proprioception, and the vestibular system) in maintaining their ability to balance as they age. Patients will apply a variety of balancing exercises that are appropriate for their current level of function. Patients will understand the common causes for poor balance, possible solutions to these problems, and ways to modify their physical environment in order to minimize their fall risk. The purpose of this lesson is to teach patients about the importance of maintaining balance as they age and ways to minimize their risk of falling.  WORKSHOPS   Nutrition:  Fueling a Ship broker led group instruction and group discussion with PowerPoint presentation and patient guidebook. To enhance the learning environment the use of posters, models and videos may be added. Patients will review the foundational principles of the Pritikin Eating Plan and understand what constitutes a serving size in each of the food groups. Patients will also learn Pritikin-friendly foods that are better choices when away from home and review make-ahead meal and snack options. Calorie density will be reviewed and applied to three nutrition priorities: weight maintenance, weight loss, and weight gain. The purpose of this lesson is to reinforce (in a group setting) the David Jarvis concepts around what patients are recommended to eat and how to apply these guidelines when away from home by planning and selecting Pritikin-friendly options. Patients will understand how  calorie density may be adjusted for different weight management goals.  Mindful Eating  Clinical staff led group instruction and group discussion with PowerPoint presentation and patient guidebook. To enhance the learning environment the use of posters, models and videos may  be added. Patients will briefly review the concepts of the Pritikin Eating Plan and the importance of low-calorie dense foods. The concept of mindful eating will be introduced as well as the importance of paying attention to internal hunger signals. Triggers for non-hunger eating and techniques for dealing with triggers will be explored. The purpose of this lesson is to provide patients with the opportunity to review the basic principles of the Pritikin Eating Plan, discuss the value of eating mindfully and how to measure internal cues of hunger and fullness using the Hunger Scale. Patients will also discuss reasons for non-hunger eating and learn strategies to use for controlling emotional eating.  Targeting Your Nutrition Priorities Clinical staff led group instruction and group discussion with PowerPoint presentation and patient guidebook. To enhance the learning environment the use of posters, models and videos may be added. Patients will learn how to determine their genetic susceptibility to disease by reviewing their family history. Patients will gain insight into the importance of diet as part of an overall healthy lifestyle in mitigating the impact of genetics and other environmental insults. The purpose of this lesson is to provide patients with the opportunity to assess their personal nutrition priorities by looking at their family history, their own health history and current risk factors. Patients will also be able to discuss ways of prioritizing and modifying the Pritikin Eating Plan for their highest risk areas  Menu  Clinical staff led group instruction and group discussion with PowerPoint presentation and patient guidebook. To enhance the learning environment the use of posters, models and videos may be added. Using menus brought in from [employer], or printed from [employer], patients will apply the Pritikin dining out guidelines that were presented in the [employer] video. Patients will also be able to practice these guidelines in a variety of provided scenarios. The purpose of this lesson is to provide patients with the opportunity to practice hands-on learning of the Pritikin Dining Out guidelines with actual menus and practice scenarios.  Label Reading Clinical staff led group instruction and group discussion with PowerPoint presentation and patient guidebook. To enhance the learning environment the use of posters, models and videos may be added. Patients will review and discuss the Pritikin label reading guidelines presented in Pritikin's Label Reading Educational series video. Using fool labels brought in from local grocery stores and markets, patients will apply the label reading guidelines and determine if the packaged food meet the Pritikin guidelines. The purpose of this lesson is to provide patients with the opportunity to review, discuss, and practice hands-on learning of the Pritikin Label Reading guidelines with actual packaged food labels. Cooking School  Pritikin's [employer] are designed to teach patients ways to prepare quick, simple, and affordable recipes at home. The importance of nutrition's role in chronic disease risk reduction is reflected in its emphasis in the overall Pritikin program. By learning how to prepare essential core Pritikin Eating Plan recipes, patients will increase control over what they eat; be able to customize the flavor of foods without the use of added salt, sugar, or fat; and improve the quality of the food they consume. By learning a set of core recipes which are easily assembled, quickly prepared, and affordable, patients  are more likely to prepare more healthy foods at home. These workshops focus on convenient breakfasts, simple entres, side dishes, and desserts which can be prepared with minimal effort and are consistent with nutrition recommendations for cardiovascular risk reduction. Cooking  [employer] are taught by a Armed forces logistics/support/administrative officer (RD) who has been trained by the [employer]. The chef or RD has a clear understanding of the importance of minimizing - if not completely eliminating - added fat, sugar, and sodium in recipes. Throughout the series of Cooking School Workshop sessions, patients will learn about healthy ingredients and efficient methods of cooking to build confidence in their capability to prepare    Cooking School weekly topics:  Adding Flavor- Sodium-Free  Fast and Healthy Breakfasts  Powerhouse Plant-Based Proteins  Satisfying Salads and Dressings  Simple Sides and Sauces  International Cuisine-Spotlight on the [employer] Zones  Delicious Desserts  Savory Soups  [employer] - Meals in a Astronomer Appetizers and Snacks  Comforting Weekend Breakfasts  One-Pot Wonders   Fast Evening Meals  Landscape architect Your Pritikin Plate  WORKSHOPS   Healthy Mindset (Psychosocial):  Focused Goals, Sustainable Changes Clinical staff led group instruction and group discussion with PowerPoint presentation and patient guidebook. To enhance the learning environment the use of posters, models and videos may be added. Patients will be able to apply effective goal setting strategies to establish at least one personal goal, and then take consistent, meaningful action toward that goal. They will learn to identify common barriers to achieving personal goals and develop strategies to overcome them. Patients will also gain an understanding of how our mind-set can impact our ability to achieve goals and the importance of cultivating a positive and growth-oriented mind-set. The purpose of this lesson is to provide patients with a deeper understanding of how to set and achieve personal goals, as well as the tools and strategies needed to overcome common obstacles which may arise along the way.  From Head to Heart: The Power of a Healthy  Outlook  Clinical staff led group instruction and group discussion with PowerPoint presentation and patient guidebook. To enhance the learning environment the use of posters, models and videos may be added. Patients will be able to recognize and describe the impact of emotions and mood on physical health. They will discover the importance of self-care and explore self-care practices which may work for them. Patients will also learn how to utilize the 4 C's to cultivate a healthier outlook and better manage stress and challenges. The purpose of this lesson is to demonstrate to patients how a healthy outlook is an essential part of maintaining good health, especially as they continue their cardiac rehab journey.  Healthy Sleep for a Healthy Heart Clinical staff led group instruction and group discussion with PowerPoint presentation and patient guidebook. To enhance the learning environment the use of posters, models and videos may be added. At the conclusion of this workshop, patients will be able to demonstrate knowledge of the importance of sleep to overall health, well-being, and quality of life. They will understand the symptoms of, and treatments for, common sleep disorders. Patients will also be able to identify daytime and nighttime behaviors which impact sleep, and they will be able to apply these tools to help manage sleep-related challenges. The purpose of this lesson is to provide patients with a general overview of sleep and outline the importance of quality sleep. Patients will learn about a few of the most  common sleep disorders. Patients will also be introduced to the concept of "sleep hygiene," and discover ways to self-manage certain sleeping problems through simple daily behavior changes. Finally, the workshop will motivate patients by clarifying the links between quality sleep and their goals of heart-healthy living.   Recognizing and Reducing Stress Clinical staff led group instruction and  group discussion with PowerPoint presentation and patient guidebook. To enhance the learning environment the use of posters, models and videos may be added. At the conclusion of this workshop, patients will be able to understand the types of stress reactions, differentiate between acute and chronic stress, and recognize the impact that chronic stress has on their health. They will also be able to apply different coping mechanisms, such as reframing negative self-talk. Patients will have the opportunity to practice a variety of stress management techniques, such as deep abdominal breathing, progressive muscle relaxation, and/or guided imagery.  The purpose of this lesson is to educate patients on the role of stress in their lives and to provide healthy techniques for coping with it.  Learning Barriers/Preferences:  Learning Barriers/Preferences - 12/25/23 1008       Learning Barriers/Preferences   Learning Barriers Sight   wears glasses   Learning Preferences Audio;Computer/Internet;Group Instruction;Individual Instruction;Pictoral;Skilled Demonstration;Verbal Instruction;Video             Education Topics:  Knowledge Questionnaire Score:  Knowledge Questionnaire Score - 12/25/23 1119       Knowledge Questionnaire Score   Pre Score 21/24             Core Components/Risk Factors/Patient Goals at Admission:  Personal Goals and Risk Factors at Admission - 12/25/23 1010       Core Components/Risk Factors/Patient Goals on Admission    Weight Management Yes;Weight Maintenance    Intervention Weight Management: Develop a combined nutrition and exercise program designed to reach desired caloric intake, while maintaining appropriate intake of nutrient and fiber, sodium and fats, and appropriate energy expenditure required for the weight goal.;Weight Management: Provide education and appropriate resources to help participant work on and attain dietary goals.    Expected Outcomes Short  Term: Continue to assess and modify interventions until short term weight is achieved;Long Term: Adherence to nutrition and physical activity/exercise program aimed toward attainment of established weight goal;Weight Maintenance: Understanding of the daily nutrition guidelines, which includes 25-35% calories from fat, 7% or less cal from saturated fats, less than 200mg  cholesterol, less than 1.5gm of sodium, & 5 or more servings of fruits and vegetables daily;Understanding recommendations for meals to include 15-35% energy as protein, 25-35% energy from fat, 35-60% energy from carbohydrates, less than 200mg  of dietary cholesterol, 20-35 gm of total fiber daily;Understanding of distribution of calorie intake throughout the day with the consumption of 4-5 meals/snacks    Hypertension Yes    Intervention Provide education on lifestyle modifcations including regular physical activity/exercise, weight management, moderate sodium restriction and increased consumption of fresh fruit, vegetables, and low fat dairy, alcohol moderation, and smoking cessation.;Monitor prescription use compliance.    Expected Outcomes Short Term: Continued assessment and intervention until BP is < 140/43mm HG in hypertensive participants. < 130/102mm HG in hypertensive participants with diabetes, heart failure or chronic kidney disease.;Long Term: Maintenance of blood pressure at goal levels.    Lipids Yes    Intervention Provide education and support for participant on nutrition & aerobic/resistive exercise along with prescribed medications to achieve LDL 70mg , HDL >40mg .    Expected Outcomes Short Term: Participant states understanding of  desired cholesterol values and is compliant with medications prescribed. Participant is following exercise prescription and nutrition guidelines.;Long Term: Cholesterol controlled with medications as prescribed, with individualized exercise RX and with personalized nutrition plan. Value goals: LDL <  70mg , HDL > 40 mg.             Core Components/Risk Factors/Patient Goals Review:   Goals and Risk Factor Review     Row Name 12/30/23 1533 01/20/24 1725           Core Components/Risk Factors/Patient Goals Review   Personal Goals Review Weight Management/Obesity;Hypertension;Lipids Weight Management/Obesity;Hypertension;Lipids      Review David Jarvis started cardiac rehab on 12/29/23. David Jarvis did well with exercise. vital signs were stable. David Jarvis  is doing well with exercise at cardiac rehab . vital signs have been stable. David Jarvis has increased his met levels.      Expected Outcomes David Jarvis will continue to participate in cardiac reha for exercise, nutrition and lifestyle modifications David Jarvis will continue to participate in cardiac reha for exercise, nutrition and lifestyle modifications               Core Components/Risk Factors/Patient Goals at Discharge (Final Review):   Goals and Risk Factor Review - 01/20/24 1725       Core Components/Risk Factors/Patient Goals Review   Personal Goals Review Weight Management/Obesity;Hypertension;Lipids    Review David Jarvis  is doing well with exercise at cardiac rehab . vital signs have been stable. David Jarvis has increased his met levels.    Expected Outcomes David Jarvis will continue to participate in cardiac reha for exercise, nutrition and lifestyle modifications             ITP Comments:  ITP Comments     Row Name 12/25/23 1120 12/30/23 1532 01/20/24 1724       ITP Comments Dr. Gaylyn Keas medical director. Introduction to pritikin education/intensive cardiac rehab. Initial orientation packet reviewed with patient. 30 Day ITP Review. David Jarvis started cardiac rehab on 12/29/23. David Jarvis did well with exercise 30 Day ITP Review. David Jarvis has good attendance and participation with exercise at cardiac rehab              Comments: See ITP comments.David Antonio RN BSN

## 2024-01-21 ENCOUNTER — Encounter (HOSPITAL_COMMUNITY)
Admission: RE | Admit: 2024-01-21 | Discharge: 2024-01-21 | Disposition: A | Discharge status: Home / Self Care | Source: Ambulatory Visit | Attending: Cardiovascular Disease | Admitting: Cardiovascular Disease

## 2024-01-21 ENCOUNTER — Encounter (HOSPITAL_COMMUNITY)

## 2024-01-21 DIAGNOSIS — Z951 Presence of aortocoronary bypass graft: Secondary | ICD-10-CM | POA: Diagnosis not present

## 2024-01-22 DIAGNOSIS — M25511 Pain in right shoulder: Secondary | ICD-10-CM | POA: Diagnosis not present

## 2024-01-22 DIAGNOSIS — G8929 Other chronic pain: Secondary | ICD-10-CM | POA: Diagnosis not present

## 2024-01-28 ENCOUNTER — Encounter (HOSPITAL_COMMUNITY)

## 2024-01-28 ENCOUNTER — Ambulatory Visit: Payer: Medicare HMO | Admitting: Cardiovascular Disease

## 2024-01-30 ENCOUNTER — Ambulatory Visit: Admitting: Physician Assistant

## 2024-01-30 ENCOUNTER — Encounter (HOSPITAL_COMMUNITY)
Admission: RE | Admit: 2024-01-30 | Discharge: 2024-01-30 | Disposition: A | Discharge status: Home / Self Care | Source: Ambulatory Visit | Attending: Cardiovascular Disease | Admitting: Cardiovascular Disease

## 2024-01-30 ENCOUNTER — Encounter: Payer: Self-pay | Admitting: Physician Assistant

## 2024-01-30 DIAGNOSIS — Z951 Presence of aortocoronary bypass graft: Secondary | ICD-10-CM | POA: Diagnosis not present

## 2024-01-30 NOTE — Progress Notes (Signed)
 This encounter was created in error - please disregard.

## 2024-02-02 ENCOUNTER — Encounter (HOSPITAL_COMMUNITY)
Admission: RE | Admit: 2024-02-02 | Discharge: 2024-02-02 | Disposition: A | Discharge status: Home / Self Care | Source: Ambulatory Visit | Attending: Cardiovascular Disease | Admitting: Cardiovascular Disease

## 2024-02-02 DIAGNOSIS — Z951 Presence of aortocoronary bypass graft: Secondary | ICD-10-CM | POA: Insufficient documentation

## 2024-02-04 ENCOUNTER — Encounter (HOSPITAL_COMMUNITY)
Admission: RE | Admit: 2024-02-04 | Discharge: 2024-02-04 | Disposition: A | Discharge status: Home / Self Care | Source: Ambulatory Visit | Attending: Cardiovascular Disease | Admitting: Cardiovascular Disease

## 2024-02-04 DIAGNOSIS — Z951 Presence of aortocoronary bypass graft: Secondary | ICD-10-CM | POA: Diagnosis not present

## 2024-02-09 ENCOUNTER — Encounter (HOSPITAL_COMMUNITY): Admission: RE | Admit: 2024-02-09 | Source: Ambulatory Visit

## 2024-02-10 ENCOUNTER — Telehealth (HOSPITAL_COMMUNITY): Payer: Self-pay | Admitting: *Deleted

## 2024-02-10 NOTE — Telephone Encounter (Signed)
 Left message to call cardiac rehab. Called regarding attendance. Last day of exercise was on 02/04/24.Monte Antonio RN BSN

## 2024-02-11 ENCOUNTER — Encounter (HOSPITAL_COMMUNITY): Admission: RE | Admit: 2024-02-11 | Source: Ambulatory Visit

## 2024-02-11 DIAGNOSIS — G8929 Other chronic pain: Secondary | ICD-10-CM | POA: Diagnosis not present

## 2024-02-11 DIAGNOSIS — M25511 Pain in right shoulder: Secondary | ICD-10-CM | POA: Diagnosis not present

## 2024-02-16 ENCOUNTER — Telehealth (HOSPITAL_COMMUNITY): Payer: Self-pay

## 2024-02-16 ENCOUNTER — Encounter (HOSPITAL_COMMUNITY): Admission: RE | Admit: 2024-02-16 | Source: Ambulatory Visit

## 2024-02-16 NOTE — Progress Notes (Signed)
 Discharge Progress Report  Patient Details  Name: KANAN SOBEK MRN: [id number] Date of Birth: 11-17-1950 Referring Provider:   Flowsheet Row INTENSIVE CARDIAC REHAB ORIENT from 12/25/2023 in [facility]  Referring Provider Elspeth Millers, MD     Number of Visits: 19  Reason for Discharge:  Early Exit:  Norleen says that he is exercising independently and can do so on his own  Smoking History:  Social History   Tobacco Use  Smoking Status Never  Smokeless Tobacco Never    Diagnosis:  S/P CABG x 3  ADL UCSD:   Initial Exercise Prescription:  Initial Exercise Prescription - 12/25/23 1100       Date of Initial Exercise RX and Referring Provider   Date 12/25/23    Referring Provider Elspeth Millers, MD    Expected Discharge Date 03/17/24      Bike   Level 2    Watts 40    Minutes 15    METs 2.5      Recumbant Elliptical   Level 1    RPM 60    Watts 70    Minutes 15    METs 2.5      Prescription Details   Frequency (times per week) 3    Duration Progress to 30 minutes of continuous aerobic without signs/symptoms of physical distress      Intensity   THRR 40-80% of Max Heartrate 60-119    Ratings of Perceived Exertion 11-13    Perceived Dyspnea 0-4      Progression   Progression Continue progressive overload as per policy without signs/symptoms or physical distress.      Resistance Training   Training Prescription Yes    Weight 3    Reps 10-15          Discharge Exercise Prescription (Final Exercise Prescription Changes):  Exercise Prescription Changes - 01/30/24 1400       Response to Exercise   Blood Pressure (Admit) 100/52    Blood Pressure (Exercise) 128/72    Blood Pressure (Exit) 108/50    Heart Rate (Admit) 62 bpm    Heart Rate (Exercise) 137 bpm    Heart Rate (Exit) 71 bpm    Rating of Perceived Exertion (Exercise) 12    Symptoms None    Comments Reviewed METs and Goals     Duration Continue with 30 min of aerobic exercise without signs/symptoms of physical distress.    Intensity THRR unchanged      Progression   Progression Continue to progress workloads to maintain intensity without signs/symptoms of physical distress.    Average METs 5.4      Resistance Training   Training Prescription Yes    Weight 6 lbs    Reps 10-15    Time 5 Minutes      Interval Training   Interval Training No      Bike   Level 4    Watts 74    Minutes 15    METs 5.5      Recumbant Elliptical   Level 4    RPM 60    Watts 95    Minutes 15    METs 5.4          Functional Capacity:  6 Minute Walk     Row Name 12/25/23 1124         6 Minute Walk   Phase Initial     Distance 1440 feet  Walk Time 6 minutes     # of Rest Breaks 0     MPH 2.73     METS 3.44     RPE 11     Perceived Dyspnea  0     VO2 Peak 12.03     Symptoms No     Resting HR 69 bpm     Resting BP 122/64     Resting Oxygen Saturation  98 %     Exercise Oxygen Saturation  during 6 min walk 98 %     Max Ex. HR 101 bpm     Max Ex. BP 142/70     2 Minute Post BP 130/76        Psychological, QOL, Others - Outcomes: PHQ 2/9:    12/25/2023   10:08 AM  Depression screen PHQ 2/9  Decreased Interest 0  Down, Depressed, Hopeless 0  PHQ - 2 Score 0  Altered sleeping 0  Tired, decreased energy 0  Change in appetite 0  Feeling bad or failure about yourself  0  Trouble concentrating 0  Moving slowly or fidgety/restless 0  Suicidal thoughts 0  PHQ-9 Score 0    Quality of Life:  Quality of Life - 12/25/23 1129       Quality of Life   Select Quality of Life      Quality of Life Scores   Health/Function Pre 28.8 %    Socioeconomic Pre 30 %    Psych/Spiritual Pre 30 %    Family Pre 30 %    GLOBAL Pre 29.45 %          Personal Goals: Goals established at orientation with interventions provided to work toward goal.  Personal Goals and Risk Factors at Admission - 12/25/23  1010       Core Components/Risk Factors/Patient Goals on Admission    Weight Management Yes;Weight Maintenance    Intervention Weight Management: Develop a combined nutrition and exercise program designed to reach desired caloric intake, while maintaining appropriate intake of nutrient and fiber, sodium and fats, and appropriate energy expenditure required for the weight goal.;Weight Management: Provide education and appropriate resources to help participant work on and attain dietary goals.    Expected Outcomes Short Term: Continue to assess and modify interventions until short term weight is achieved;Long Term: Adherence to nutrition and physical activity/exercise program aimed toward attainment of established weight goal;Weight Maintenance: Understanding of the daily nutrition guidelines, which includes 25-35% calories from fat, 7% or less cal from saturated fats, less than 200mg  cholesterol, less than 1.5gm of sodium, & 5 or more servings of fruits and vegetables daily;Understanding recommendations for meals to include 15-35% energy as protein, 25-35% energy from fat, 35-60% energy from carbohydrates, less than 200mg  of dietary cholesterol, 20-35 gm of total fiber daily;Understanding of distribution of calorie intake throughout the day with the consumption of 4-5 meals/snacks    Hypertension Yes    Intervention Provide education on lifestyle modifcations including regular physical activity/exercise, weight management, moderate sodium restriction and increased consumption of fresh fruit, vegetables, and low fat dairy, alcohol moderation, and smoking cessation.;Monitor prescription use compliance.    Expected Outcomes Short Term: Continued assessment and intervention until BP is < 140/32mm HG in hypertensive participants. < 130/64mm HG in hypertensive participants with diabetes, heart failure or chronic kidney disease.;Long Term: Maintenance of blood pressure at goal levels.    Lipids Yes     Intervention Provide education and support for participant on nutrition & aerobic/resistive exercise along  with prescribed medications to achieve LDL 70mg , HDL >40mg .    Expected Outcomes Short Term: Participant states understanding of desired cholesterol values and is compliant with medications prescribed. Participant is following exercise prescription and nutrition guidelines.;Long Term: Cholesterol controlled with medications as prescribed, with individualized exercise RX and with personalized nutrition plan. Value goals: LDL < 70mg , HDL > 40 mg.           Personal Goals Discharge:  Goals and Risk Factor Review     Row Name 12/30/23 1533 01/20/24 1725           Core Components/Risk Factors/Patient Goals Review   Personal Goals Review Weight Management/Obesity;Hypertension;Lipids Weight Management/Obesity;Hypertension;Lipids      Review Aja started cardiac rehab on 12/29/23. Keen did well with exercise. vital signs were stable. Mitcheal  is doing well with exercise at cardiac rehab . vital signs have been stable. Lucius has increased his met levels.      Expected Outcomes Kebron will continue to participate in cardiac reha for exercise, nutrition and lifestyle modifications Larson will continue to participate in cardiac reha for exercise, nutrition and lifestyle modifications         Exercise Goals and Review:  Exercise Goals     Row Name 12/25/23 1124             Exercise Goals   Increase Physical Activity Yes       Intervention Provide advice, education, support and counseling about physical activity/exercise needs.;Develop an individualized exercise prescription for aerobic and resistive training based on initial evaluation findings, risk stratification, comorbidities and participant's personal goals.       Expected Outcomes Short Term: Attend rehab on a regular basis to increase amount of physical activity.;Long Term: Exercising regularly at least 3-5 days a week.;Long Term: Add in  home exercise to make exercise part of routine and to increase amount of physical activity.       Increase Strength and Stamina Yes       Intervention Provide advice, education, support and counseling about physical activity/exercise needs.;Develop an individualized exercise prescription for aerobic and resistive training based on initial evaluation findings, risk stratification, comorbidities and participant's personal goals.       Expected Outcomes Short Term: Increase workloads from initial exercise prescription for resistance, speed, and METs.;Short Term: Perform resistance training exercises routinely during rehab and add in resistance training at home;Long Term: Improve cardiorespiratory fitness, muscular endurance and strength as measured by increased METs and functional capacity ( )       Able to understand and use rate of perceived exertion (RPE) scale Yes       Intervention Provide education and explanation on how to use RPE scale       Expected Outcomes Short Term: Able to use RPE daily in rehab to express subjective intensity level;Long Term:  Able to use RPE to guide intensity level when exercising independently       Knowledge and understanding of Target Heart Rate Range (THRR) Yes       Intervention Provide education and explanation of THRR including how the numbers were predicted and where they are located for reference       Expected Outcomes Short Term: Able to state/look up THRR;Short Term: Able to use daily as guideline for intensity in rehab;Long Term: Able to use THRR to govern intensity when exercising independently       Understanding of Exercise Prescription Yes       Intervention Provide education, explanation, and written materials on  patient's individual exercise prescription       Expected Outcomes Long Term: Able to explain home exercise prescription to exercise independently;Short Term: Able to explain program exercise prescription          Exercise Goals  Re-Evaluation:  Exercise Goals Re-Evaluation     Row Name 12/29/23 1644 01/19/24 1409 01/30/24 1422         Exercise Goal Re-Evaluation   Exercise Goals Review Increase Physical Activity;Increase Strength and Stamina;Able to understand and use rate of perceived exertion (RPE) scale;Knowledge and understanding of Target Heart Rate Range (THRR);Understanding of Exercise Prescription Increase Physical Activity;Increase Strength and Stamina;Able to understand and use rate of perceived exertion (RPE) scale;Knowledge and understanding of Target Heart Rate Range (THRR);Understanding of Exercise Prescription Increase Physical Activity;Increase Strength and Stamina;Able to understand and use rate of perceived exertion (RPE) scale;Knowledge and understanding of Target Heart Rate Range (THRR);Understanding of Exercise Prescription     Comments Pt's first day in the CRP2 program. Pt understands the exercise Rx, RPE sclae and THRR. Reviewed METs with pt, pt has increased his METs from 3.8-4.9. Pt self efficacy has increased in the program. He is increasing his WL as tolerated by himself and is happy with his progress Pt is satisfied with current ExRx. Reviewed METs and Goals. Keshun is consistently exercising at 4-5 METs. Pt is tolerating his ExRx well and feels that he is being challenged. Pt is increasing his weights at home, and plans on going back to gym soon. Pt R shoulder is still bothering him.     Expected Outcomes Will continue to monitor patient and progress workloads as tolerated. Will continue to monitor patient and progress workloads as tolerated. Will continue to monitor patient and progress workloads as tolerated.        Nutrition & Weight - Outcomes:  Pre Biometrics - 12/25/23 1005       Pre Biometrics   Waist Circumference 31.5 inches    Hip Circumference 35 inches    Waist to Hip Ratio 0.9 %    Triceps Skinfold 8 mm    % Body Fat 19.5 %    Grip Strength 30 kg    Flexibility 10.5 in     Single Leg Stand 20.75 seconds           Nutrition:  Nutrition Therapy & Goals - 01/30/24 1453       Nutrition Therapy   Diet Heart healthy Diet    Drug/Food Interactions Statins/Certain Fruits      Personal Nutrition Goals   Nutrition Goal Patient to identify strategies for reducing cardiovascular risk by attending the [facility] education and nutrition series weekly.   goal in progress.   Personal Goal #2 Patient to improve diet quality by using the plate method as a guide for meal planning to include lean protein/plant protein, fruits, vegetables, whole grains, nonfat dairy as part of a well-balanced diet.   goal in progress.   Comments Goals in progress. 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 LDL is well controlled. Patient has struggled to maintain weight since whipple procedure in 2024. His weight has been stable since starting cardiac rehab; BMI is appropriate for age at this time. He continues to attend the P[facility]in education/nutrition series regularly.  Patient will benefit from participation in intensive cardiac rehab for nutrition, exercise, and lifestyle modification.      Intervention Plan   Intervention Prescribe, educate and counsel regarding individualized specific dietary modifications aiming towards  targeted core components such as weight, hypertension, lipid management, diabetes, heart failure and other comorbidities.;Nutrition handout(s) given to patient.    Expected Outcomes Short Term Goal: Understand basic principles of dietary content, such as calories, fat, sodium, cholesterol and nutrients.;Long Term Goal: Adherence to prescribed nutrition plan.          Nutrition Discharge:  Nutrition Assessments - 01/08/24 1517       Rate Your Plate Scores   Pre Score 58          Education Questionnaire Score:  Knowledge Questionnaire Score - 12/25/23 1119       Knowledge  Questionnaire Score   Pre Score 21/24          Pt completed  19 exercise and education sessions between 12/26/22- 02/04/24. Pt maintained good attendance and progressed nicely during their participation in rehab as evidenced by increased MET level. Babe completed exercise early.  Pt plans to continue exercise at on his own at the gym.Hadassah Elpidio Quan RN BSN

## 2024-02-16 NOTE — Telephone Encounter (Signed)
 Pt called wanting to cancel his cardiac rehab because he feels as if he is ready to go to the gym on his own and do some light workouts. I have canceled the rest of his appts.

## 2024-02-18 ENCOUNTER — Encounter (HOSPITAL_COMMUNITY)

## 2024-02-23 ENCOUNTER — Encounter (HOSPITAL_COMMUNITY)

## 2024-02-25 ENCOUNTER — Encounter (HOSPITAL_COMMUNITY)

## 2024-02-25 ENCOUNTER — Telehealth: Payer: Self-pay | Admitting: Cardiovascular Disease

## 2024-02-25 NOTE — Telephone Encounter (Signed)
   Patient Name: DEKARI BURES  DOB: 05/08/1951 MRN: [id number]  Primary Cardiologist: Jerel Balding, MD  Chart reviewed as part of pre-operative protocol coverage.   It is generally accepted that for simple extractions and dental cleanings, there is no need to interrupt blood thinner therapy.   SBE prophylaxis is not required for the patient from a cardiac standpoint.  I will route this recommendation to the requesting party via Epic fax function and remove from pre-op pool.  Please call with questions.  Lakea Mittelman, [place] 02/25/2024, 10:19 AM

## 2024-02-25 NOTE — Telephone Encounter (Signed)
   Pre-operative Risk Assessment    Patient Name: David Jarvis  DOB: [birth year] MRN: [id number]     Request for Surgical Clearance    Procedure:  Cleaning  Date of Surgery:  Clearance 02/25/24                                 Surgeon:  Tawni Nap Surgeon's Group or Practice Name:  [facility]  Phone number:  [phone]  Fax number:  [phone]   Type of Clearance Requested:   - Pharmacy:  Hold    unsure   Type of Anesthesia:  None    Additional requests/questions:  Does this patient need antibiotics?  Signed, April L Harrington   02/25/2024, 10:04 AM

## 2024-02-25 NOTE — Telephone Encounter (Signed)
 I s/w the dental office and confirmed procedure to be done, confirmed fax#. I did review the notes from preop APP Scot Ford, PAC.

## 2024-02-27 ENCOUNTER — Encounter: Payer: Self-pay | Admitting: Cardiovascular Disease

## 2024-03-01 ENCOUNTER — Encounter (HOSPITAL_COMMUNITY)

## 2024-03-03 ENCOUNTER — Encounter (HOSPITAL_COMMUNITY)

## 2024-03-08 ENCOUNTER — Encounter (HOSPITAL_COMMUNITY)

## 2024-03-09 MED ORDER — METOPROLOL TARTRATE 25 MG PO TABS: 12.5000 mg | ORAL_TABLET | Freq: Two times a day (BID) | ORAL | 0 refills | Status: AC

## 2024-03-10 ENCOUNTER — Encounter (HOSPITAL_COMMUNITY)

## 2024-03-15 ENCOUNTER — Encounter (HOSPITAL_COMMUNITY)

## 2024-03-16 DIAGNOSIS — Z9049 Acquired absence of other specified parts of digestive tract: Secondary | ICD-10-CM | POA: Diagnosis not present

## 2024-03-16 DIAGNOSIS — K831 Obstruction of bile duct: Secondary | ICD-10-CM | POA: Diagnosis not present

## 2024-03-16 DIAGNOSIS — C250 Malignant neoplasm of head of pancreas: Secondary | ICD-10-CM | POA: Diagnosis not present

## 2024-03-17 ENCOUNTER — Encounter (HOSPITAL_COMMUNITY)

## 2024-04-13 ENCOUNTER — Telehealth (HOSPITAL_COMMUNITY): Payer: Self-pay

## 2024-04-16 DIAGNOSIS — E039 Hypothyroidism, unspecified: Secondary | ICD-10-CM | POA: Diagnosis not present

## 2024-04-16 DIAGNOSIS — K831 Obstruction of bile duct: Secondary | ICD-10-CM | POA: Diagnosis not present

## 2024-04-16 DIAGNOSIS — Z888 Allergy status to other drugs, medicaments and biological substances status: Secondary | ICD-10-CM | POA: Diagnosis not present

## 2024-04-16 DIAGNOSIS — Z8507 Personal history of malignant neoplasm of pancreas: Secondary | ICD-10-CM | POA: Diagnosis not present

## 2024-04-16 DIAGNOSIS — I10 Essential (primary) hypertension: Secondary | ICD-10-CM | POA: Diagnosis not present

## 2024-04-16 DIAGNOSIS — I2510 Atherosclerotic heart disease of native coronary artery without angina pectoris: Secondary | ICD-10-CM | POA: Diagnosis not present

## 2024-04-16 DIAGNOSIS — E785 Hyperlipidemia, unspecified: Secondary | ICD-10-CM | POA: Diagnosis not present

## 2024-04-16 DIAGNOSIS — Z90411 Acquired partial absence of pancreas: Secondary | ICD-10-CM | POA: Diagnosis not present

## 2024-04-16 DIAGNOSIS — K660 Peritoneal adhesions (postprocedural) (postinfection): Secondary | ICD-10-CM | POA: Diagnosis not present

## 2024-04-16 DIAGNOSIS — Z79899 Other long term (current) drug therapy: Secondary | ICD-10-CM | POA: Diagnosis not present

## 2024-04-16 DIAGNOSIS — Z951 Presence of aortocoronary bypass graft: Secondary | ICD-10-CM | POA: Diagnosis not present

## 2024-04-16 DIAGNOSIS — N400 Enlarged prostate without lower urinary tract symptoms: Secondary | ICD-10-CM | POA: Diagnosis not present

## 2024-04-27 DIAGNOSIS — R1084 Generalized abdominal pain: Secondary | ICD-10-CM | POA: Diagnosis not present

## 2024-04-27 DIAGNOSIS — C250 Malignant neoplasm of head of pancreas: Secondary | ICD-10-CM | POA: Diagnosis not present

## 2024-04-27 DIAGNOSIS — K831 Obstruction of bile duct: Secondary | ICD-10-CM | POA: Diagnosis not present

## 2024-05-14 ENCOUNTER — Inpatient Hospital Stay: Admitting: Oncology

## 2024-05-14 ENCOUNTER — Inpatient Hospital Stay: Attending: Oncology

## 2024-05-14 VITALS — BP 110/74 | HR 63 | Temp 98.2°F | Resp 15 | Ht 66.0 in | Wt 136.1 lb

## 2024-05-14 DIAGNOSIS — C251 Malignant neoplasm of body of pancreas: Secondary | ICD-10-CM

## 2024-05-14 DIAGNOSIS — Z8507 Personal history of malignant neoplasm of pancreas: Secondary | ICD-10-CM | POA: Diagnosis not present

## 2024-05-14 DIAGNOSIS — Z08 Encounter for follow-up examination after completed treatment for malignant neoplasm: Secondary | ICD-10-CM | POA: Insufficient documentation

## 2024-05-14 LAB — CMP (CANCER CENTER ONLY)
ALT: 50 U/L — ABNORMAL HIGH (ref 0–44)
AST: 58 U/L — ABNORMAL HIGH (ref 15–41)
Albumin: 3.8 g/dL (ref 3.5–5.0)
Alkaline Phosphatase: 232 U/L — ABNORMAL HIGH (ref 38–126)
Anion gap: 11 (ref 5–15)
BUN: 11 mg/dL (ref 8–23)
CO2: 24 mmol/L (ref 22–32)
Calcium: 8.6 mg/dL — ABNORMAL LOW (ref 8.9–10.3)
Chloride: 104 mmol/L (ref 98–111)
Creatinine: 0.97 mg/dL (ref 0.61–1.24)
GFR, Estimated: >60 mL/min (ref >60)
Glucose, Bld: 291 mg/dL — ABNORMAL HIGH (ref 70–99)
Potassium: 4 mmol/L (ref 3.5–5.1)
Sodium: 138 mmol/L (ref 135–145)
Total Bilirubin: 0.4 mg/dL (ref 0.0–1.2)
Total Protein: 6.1 g/dL — ABNORMAL LOW (ref 6.5–8.1)

## 2024-05-14 NOTE — Progress Notes (Signed)
 [facility] OFFICE PROGRESS NOTE   Diagnosis: Pancreas cancer  INTERVAL HISTORY:   Mr. Rudnick developed current fevers in July.  He saw Dr. Zani and underwent a robotic hepaticojejunostomy revision 04/16/2024.  No recurrent fever.  He feels well.  He plans to resume workouts 06/02/2024.  The right leg is no longer swollen.  He has a persistent knot at the distal right thigh.  Objective:  Vital signs in last 24 hours:  Blood pressure 110/74, pulse 63, temperature 98.2 F (36.8 C), temperature source Temporal, resp. rate 15, height 5' 6 (1.676 m), weight 136 lb 1.6 oz (61.7 kg), SpO2 100%. Lymphatics: No cervical, supraclavicular, axillary, or inguinal nodes Resp: Lungs clear bilaterally Cardio: Regular rate and rhythm GI: No hepatosplenomegaly, no mass, no apparent ascites Vascular: No leg edema, 2 cm mobile cutaneous nodule at the distal medial right thigh   Lab Results:  Lab Results  Component Value Date   WBC 6.2 11/09/2023   HGB 8.4 (L) 11/09/2023   HCT 24.6 (L) 11/09/2023   MCV 89.8 11/09/2023   PLT 97 (L) 11/09/2023   NEUTROABS 6.7 05/12/2022    CMP  Lab Results  Component Value Date   NA 138 05/14/2024   K 4.0 05/14/2024   CL 104 05/14/2024   CO2 24 05/14/2024   GLUCOSE 291 (H) 05/14/2024   BUN 11 05/14/2024   CREATININE 0.97 05/14/2024   CALCIUM  8.6 (L) 05/14/2024   PROT 6.1 (L) 05/14/2024   ALBUMIN  3.8 05/14/2024   AST 58 (H) 05/14/2024   ALT 50 (H) 05/14/2024   ALKPHOS 232 (H) 05/14/2024   BILITOT 0.4 05/14/2024   GFRNONAA >60 05/14/2024    Lab Results  Component Value Date   RJW800 11 01/12/2024     Medications: I have reviewed the patient's current medications.   Assessment/Plan: Pancreas cancer, adenocarcinoma MRI/MRCP abdomen 05/18/2021-diffuse biliary and pancreatic duct dilation, 2.8 cm mass in the pancreas head, no evidence of metastatic disease ERCP 05/23/2021-localized stricture in the lower third of the main  bile duct, stent placed EUS 05/23/2021-25 x 15 mm pancreas head mass with irregular borders, no lymphadenopathy, no vascular involvement, T2 N0 by ultrasound CT chest 06/01/2021-negative for metastatic disease CT abdomen pancreas protocol 06/01/2021-3.2 x 2.3 cm hypoenhancing pancreas head mass inseparable from the third portion of the duodenum, 1 cm left retroperitoneal lymph node, no vascular involvement Cycle 1 FOLFIRINOX 06/19/2021 Cycle 2 FOLFIRINOX 07/02/2021, irinotecan  and 5-FU dose reduced, prophylactic dexamethasone  added beginning day of pump discontinuation Cycle 3 FOLFIRINOX 07/23/2021, Udenyca  CT abdomen/pelvis 08/01/2021-done in the emergency department to evaluate epigastric pain-pancreas head mass decreased in size.  Diffuse gaseous distention of large and small bowel.  No bowel obstruction or focal inflammation. 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 Cloretta reviewed pathology report from the Whipple procedure with Dr. Zani, adjuvant radiation not recommended. CT abdomen/pelvis at [facility] 03/10/2022-fat stranding at the pancreaticojejunostomy  CTs at [facility] 05/29/2022-no evidence of metastatic disease, nonspecific colitis  of the sigmoid colon, unchanged mild intrahepatic biliary ductal dilatation CT pancreas protocol abdomen/pelvis at [facility] 01/06/2023-resolution of hepatic fluid collection, percutaneous biliary drains in position, no evidence of recurrent or metastatic disease in the abdomen or pelvis CT pancreas protocol abdomen/pelvis at [facility] 05/28/2023-wedge-shaped area of hypoattenuation in segment 7/8 less conspicuous, likely sequelae of percutaneous biliary drains no evidence of metastatic disease Obstructive jaundice secondary #1 Hypertension Admission in February 2022 with elevated liver enzymes MRI/MRCP-intra and extrahepatic biliary duct dilation, tapering smoothly to the ampulla, no mass or filling defect ERCP 10/06/2020-dilated main bile duct, biliary tree was swept and nothing was found, cytology from bile duct brushing-no malignant cells identified 5.  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  GERD 7.  Squamous cell carcinoma left forearm 2014 8.  Hyperlipidemia 9.  CT chest 08/01/2021, done in the emergency department to evaluate pleuritic chest pain-no PE.  Advanced three-vessel coronary vascular calcification. 10.  Fever of unknown origin beginning 08/14/2021-blood and urine cultures negative, no source for infection identified, placed on antibiotic prophylaxis beginning 08/17/2021, recurrent fever 10/09/2021-cultures negative, completed a course of Levaquin  11.  Fever/chills on 12/02/2021-urinalysis with clumps of white cells, culture positive for Staphylococcus epidermis and Enterococcus, completed a course of Augmentin Persistent fevers September 2023 ERCP 06/03/2022-stenotic bile duct orifice, biliary tract could not be accessed Multiple percutaneous biliary drain procedures at [facility] with placement of external/internal drains to manage a stricture at the hepaticojejunostomy, last procedure to upsize the drains on 10/16/2022 02/07/2023-external biliary drains removed 04/16/2024-revision of  hepaticojejunostomy 12.  Coronary artery bypass surgery 11/06/2023 13.  Distal right thigh nodule: Doppler 512 2025-2.75 x 1.03 cm anechoic avascular area noted near the medial right popliteal incision       Disposition: Mr. Snee remains in clinical remission from pancreas cancer.  He is recovering from recent hepaticojejunostomy revision surgery.  The liver enzymes are mildly elevated.  This is likely secondary to recent surgery.  He plans to have the liver enzymes checked by Dr. Husain next month.  The nodular area at the distal right thigh is likely related to the coronary artery bypass surgery.  Mr. Podgorski will return for an office visit in 6 months.  Arley Hof, MD  05/14/2024  10:43 AM

## 2024-07-15 DIAGNOSIS — H52203 Unspecified astigmatism, bilateral: Secondary | ICD-10-CM | POA: Diagnosis not present

## 2024-07-15 DIAGNOSIS — H5213 Myopia, bilateral: Secondary | ICD-10-CM | POA: Diagnosis not present

## 2024-07-15 DIAGNOSIS — E119 Type 2 diabetes mellitus without complications: Secondary | ICD-10-CM | POA: Diagnosis not present

## 2024-07-28 ENCOUNTER — Encounter: Payer: Self-pay | Admitting: Cardiovascular Disease

## 2024-09-16 NOTE — Progress Notes (Signed)
 David Jarvis                                          MRN: [id number]   09/16/2024   The [facility] Quality Team Specialist reviewed this patient medical record for the purposes of chart review for care gap closure. The following were reviewed: chart review for care gap closure-diabetic eye exam.    [facility] Quality Team

## 2024-11-11 ENCOUNTER — Other Ambulatory Visit

## 2024-11-11 ENCOUNTER — Ambulatory Visit: Admitting: Oncology
